# Patient Record
Sex: Female | Born: 1935 | Race: White | Hispanic: No | Smoking: Never smoker
Health system: Southern US, Community
[De-identification: ages and names within clinical notes are randomized; demographics above are authoritative.]

## PROBLEM LIST (undated history)

## (undated) DIAGNOSIS — M1990 Unspecified osteoarthritis, unspecified site: Secondary | ICD-10-CM

## (undated) DIAGNOSIS — I252 Old myocardial infarction: Secondary | ICD-10-CM

## (undated) DIAGNOSIS — M169 Osteoarthritis of hip, unspecified: Secondary | ICD-10-CM

## (undated) DIAGNOSIS — N183 Chronic kidney disease, stage 3 unspecified: Secondary | ICD-10-CM

## (undated) DIAGNOSIS — I509 Heart failure, unspecified: Secondary | ICD-10-CM

## (undated) DIAGNOSIS — N200 Calculus of kidney: Secondary | ICD-10-CM

## (undated) DIAGNOSIS — M810 Age-related osteoporosis without current pathological fracture: Secondary | ICD-10-CM

## (undated) DIAGNOSIS — Z9981 Dependence on supplemental oxygen: Secondary | ICD-10-CM

## (undated) DIAGNOSIS — I2589 Other forms of chronic ischemic heart disease: Secondary | ICD-10-CM

## (undated) DIAGNOSIS — E7800 Pure hypercholesterolemia, unspecified: Secondary | ICD-10-CM

## (undated) DIAGNOSIS — I2510 Atherosclerotic heart disease of native coronary artery without angina pectoris: Secondary | ICD-10-CM

## (undated) DIAGNOSIS — I513 Intracardiac thrombosis, not elsewhere classified: Secondary | ICD-10-CM

## (undated) DIAGNOSIS — I10 Essential (primary) hypertension: Secondary | ICD-10-CM

## (undated) HISTORY — DX: Intracardiac thrombosis, not elsewhere classified: I51.3

## (undated) HISTORY — DX: Essential (primary) hypertension: I10

## (undated) HISTORY — PX: CARDIAC CATHETERIZATION: SHX172

## (undated) HISTORY — DX: Osteoarthritis of hip, unspecified: M16.9

## (undated) HISTORY — DX: Calculus of kidney: N20.0

## (undated) HISTORY — DX: Age-related osteoporosis without current pathological fracture: M81.0

## (undated) HISTORY — DX: Atherosclerotic heart disease of native coronary artery without angina pectoris: I25.10

## (undated) HISTORY — PX: CORONARY ANGIOPLASTY: SHX604

## (undated) HISTORY — DX: Other forms of chronic ischemic heart disease: I25.89

## (undated) HISTORY — PX: OTHER SURGICAL HISTORY: SHX169

## (undated) HISTORY — DX: Dependence on supplemental oxygen: Z99.81

## (undated) HISTORY — DX: Unspecified osteoarthritis, unspecified site: M19.90

## (undated) HISTORY — DX: Heart failure, unspecified: I50.9

## (undated) HISTORY — PX: CORONARY ANGIOPLASTY WITH STENT PLACEMENT: SHX49

## (undated) HISTORY — DX: Pure hypercholesterolemia, unspecified: E78.00

---

## 1976-03-24 HISTORY — PX: VESICOVAGINAL FISTULA CLOSURE W/ TAH: SUR271

## 1997-05-02 ENCOUNTER — Ambulatory Visit (HOSPITAL_COMMUNITY): Admission: RE | Admit: 1997-05-02 | Discharge: 1997-05-02 | Payer: Self-pay | Admitting: Internal Medicine

## 1997-12-02 ENCOUNTER — Emergency Department (HOSPITAL_COMMUNITY): Admission: EM | Admit: 1997-12-02 | Discharge: 1997-12-02 | Payer: Self-pay | Admitting: Emergency Medicine

## 1998-05-23 ENCOUNTER — Encounter: Payer: Self-pay | Admitting: Internal Medicine

## 1998-05-23 ENCOUNTER — Ambulatory Visit (HOSPITAL_COMMUNITY): Admission: RE | Admit: 1998-05-23 | Discharge: 1998-05-23 | Payer: Self-pay | Admitting: Internal Medicine

## 1998-06-25 ENCOUNTER — Other Ambulatory Visit: Admission: RE | Admit: 1998-06-25 | Discharge: 1998-06-25 | Payer: Self-pay | Admitting: Internal Medicine

## 1999-02-05 ENCOUNTER — Encounter: Admission: RE | Admit: 1999-02-05 | Discharge: 1999-05-06 | Payer: Self-pay | Admitting: Anesthesiology

## 1999-06-03 ENCOUNTER — Ambulatory Visit (HOSPITAL_COMMUNITY): Admission: RE | Admit: 1999-06-03 | Discharge: 1999-06-03 | Payer: Self-pay | Admitting: Internal Medicine

## 1999-06-03 ENCOUNTER — Encounter: Payer: Self-pay | Admitting: Internal Medicine

## 1999-07-01 ENCOUNTER — Other Ambulatory Visit: Admission: RE | Admit: 1999-07-01 | Discharge: 1999-07-01 | Payer: Self-pay | Admitting: Internal Medicine

## 1999-10-09 ENCOUNTER — Encounter: Admission: RE | Admit: 1999-10-09 | Discharge: 1999-10-09 | Payer: Self-pay

## 2000-06-15 ENCOUNTER — Encounter: Payer: Self-pay | Admitting: Internal Medicine

## 2000-06-15 ENCOUNTER — Ambulatory Visit (HOSPITAL_COMMUNITY): Admission: RE | Admit: 2000-06-15 | Discharge: 2000-06-15 | Payer: Self-pay | Admitting: Internal Medicine

## 2000-06-29 ENCOUNTER — Other Ambulatory Visit: Admission: RE | Admit: 2000-06-29 | Discharge: 2000-06-29 | Payer: Self-pay | Admitting: Internal Medicine

## 2000-07-07 ENCOUNTER — Ambulatory Visit (HOSPITAL_COMMUNITY): Admission: RE | Admit: 2000-07-07 | Discharge: 2000-07-07 | Payer: Self-pay | Admitting: Internal Medicine

## 2000-07-07 ENCOUNTER — Encounter: Payer: Self-pay | Admitting: Internal Medicine

## 2000-08-11 ENCOUNTER — Encounter: Admission: RE | Admit: 2000-08-11 | Discharge: 2000-09-03 | Payer: Self-pay | Admitting: Neurosurgery

## 2000-11-17 ENCOUNTER — Ambulatory Visit (HOSPITAL_COMMUNITY): Admission: RE | Admit: 2000-11-17 | Discharge: 2000-11-17 | Payer: Self-pay | Admitting: Neurosurgery

## 2000-11-17 ENCOUNTER — Encounter: Payer: Self-pay | Admitting: Neurosurgery

## 2001-06-17 ENCOUNTER — Encounter: Payer: Self-pay | Admitting: Internal Medicine

## 2001-06-17 ENCOUNTER — Ambulatory Visit (HOSPITAL_COMMUNITY): Admission: RE | Admit: 2001-06-17 | Discharge: 2001-06-17 | Payer: Self-pay | Admitting: Internal Medicine

## 2001-07-22 ENCOUNTER — Other Ambulatory Visit: Admission: RE | Admit: 2001-07-22 | Discharge: 2001-07-22 | Payer: Self-pay | Admitting: Internal Medicine

## 2002-08-16 ENCOUNTER — Ambulatory Visit (HOSPITAL_COMMUNITY): Admission: RE | Admit: 2002-08-16 | Discharge: 2002-08-16 | Payer: Self-pay | Admitting: Internal Medicine

## 2002-08-16 ENCOUNTER — Encounter: Payer: Self-pay | Admitting: Internal Medicine

## 2003-04-13 ENCOUNTER — Ambulatory Visit (HOSPITAL_COMMUNITY): Admission: RE | Admit: 2003-04-13 | Discharge: 2003-04-13 | Payer: Self-pay | Admitting: Neurosurgery

## 2003-09-14 ENCOUNTER — Encounter: Admission: RE | Admit: 2003-09-14 | Discharge: 2003-09-14 | Payer: Self-pay | Admitting: Internal Medicine

## 2004-05-17 ENCOUNTER — Inpatient Hospital Stay (HOSPITAL_COMMUNITY): Admission: RE | Admit: 2004-05-17 | Discharge: 2004-05-27 | Payer: Self-pay | Admitting: Cardiology

## 2004-05-17 ENCOUNTER — Emergency Department (HOSPITAL_COMMUNITY): Admission: EM | Admit: 2004-05-17 | Discharge: 2004-05-17 | Payer: Self-pay | Admitting: Emergency Medicine

## 2004-05-17 ENCOUNTER — Ambulatory Visit: Payer: Self-pay | Admitting: Physical Medicine & Rehabilitation

## 2004-05-17 ENCOUNTER — Ambulatory Visit: Payer: Self-pay | Admitting: Cardiology

## 2004-05-21 ENCOUNTER — Encounter: Payer: Self-pay | Admitting: Cardiology

## 2004-06-14 ENCOUNTER — Ambulatory Visit: Payer: Self-pay | Admitting: Cardiology

## 2004-06-17 ENCOUNTER — Encounter (HOSPITAL_COMMUNITY): Admission: RE | Admit: 2004-06-17 | Discharge: 2004-07-17 | Payer: Self-pay | Admitting: Cardiology

## 2004-06-28 ENCOUNTER — Ambulatory Visit: Payer: Self-pay

## 2004-07-19 ENCOUNTER — Encounter (HOSPITAL_COMMUNITY): Admission: RE | Admit: 2004-07-19 | Discharge: 2004-08-18 | Payer: Self-pay | Admitting: Cardiology

## 2004-07-24 ENCOUNTER — Ambulatory Visit: Payer: Self-pay | Admitting: Cardiology

## 2004-07-24 ENCOUNTER — Ambulatory Visit: Payer: Self-pay

## 2004-07-30 ENCOUNTER — Ambulatory Visit: Payer: Self-pay | Admitting: Cardiology

## 2004-08-19 ENCOUNTER — Encounter (HOSPITAL_COMMUNITY): Admission: RE | Admit: 2004-08-19 | Discharge: 2004-09-18 | Payer: Self-pay | Admitting: Cardiology

## 2004-09-10 ENCOUNTER — Ambulatory Visit: Payer: Self-pay | Admitting: Cardiology

## 2004-11-13 ENCOUNTER — Ambulatory Visit: Payer: Self-pay

## 2004-11-13 ENCOUNTER — Ambulatory Visit: Payer: Self-pay | Admitting: Cardiology

## 2004-11-20 ENCOUNTER — Ambulatory Visit: Payer: Self-pay | Admitting: Internal Medicine

## 2004-11-28 ENCOUNTER — Ambulatory Visit: Payer: Self-pay | Admitting: Internal Medicine

## 2004-11-28 ENCOUNTER — Inpatient Hospital Stay (HOSPITAL_COMMUNITY): Admission: RE | Admit: 2004-11-28 | Discharge: 2004-11-29 | Payer: Self-pay | Admitting: Internal Medicine

## 2004-12-05 ENCOUNTER — Ambulatory Visit: Payer: Self-pay

## 2004-12-11 ENCOUNTER — Ambulatory Visit: Payer: Self-pay

## 2005-01-07 ENCOUNTER — Ambulatory Visit: Payer: Self-pay | Admitting: Cardiology

## 2005-03-11 ENCOUNTER — Ambulatory Visit: Payer: Self-pay | Admitting: Internal Medicine

## 2005-03-27 ENCOUNTER — Ambulatory Visit: Payer: Self-pay | Admitting: Cardiology

## 2005-06-10 ENCOUNTER — Ambulatory Visit: Payer: Self-pay | Admitting: Internal Medicine

## 2005-07-21 ENCOUNTER — Encounter: Payer: Self-pay | Admitting: Cardiology

## 2005-07-21 ENCOUNTER — Ambulatory Visit: Payer: Self-pay | Admitting: Cardiology

## 2005-07-21 ENCOUNTER — Ambulatory Visit: Payer: Self-pay

## 2005-10-14 ENCOUNTER — Ambulatory Visit: Payer: Self-pay | Admitting: Internal Medicine

## 2005-10-27 ENCOUNTER — Ambulatory Visit: Payer: Self-pay | Admitting: Cardiology

## 2006-01-13 ENCOUNTER — Ambulatory Visit: Payer: Self-pay | Admitting: Internal Medicine

## 2006-02-23 ENCOUNTER — Ambulatory Visit: Payer: Self-pay | Admitting: Cardiology

## 2006-03-10 ENCOUNTER — Ambulatory Visit: Payer: Self-pay

## 2006-03-10 ENCOUNTER — Encounter: Payer: Self-pay | Admitting: Cardiology

## 2006-03-18 ENCOUNTER — Ambulatory Visit: Payer: Self-pay | Admitting: Cardiology

## 2006-03-23 ENCOUNTER — Ambulatory Visit: Payer: Self-pay

## 2006-03-25 ENCOUNTER — Encounter: Admission: RE | Admit: 2006-03-25 | Discharge: 2006-03-25 | Payer: Self-pay | Admitting: Internal Medicine

## 2006-04-01 ENCOUNTER — Ambulatory Visit: Payer: Self-pay | Admitting: Cardiology

## 2006-04-07 ENCOUNTER — Ambulatory Visit: Payer: Self-pay | Admitting: Cardiology

## 2006-04-07 ENCOUNTER — Ambulatory Visit (HOSPITAL_COMMUNITY): Admission: AD | Admit: 2006-04-07 | Discharge: 2006-04-07 | Payer: Self-pay | Admitting: Urology

## 2006-04-17 ENCOUNTER — Ambulatory Visit: Payer: Self-pay | Admitting: Internal Medicine

## 2006-05-01 ENCOUNTER — Ambulatory Visit: Payer: Self-pay | Admitting: *Deleted

## 2006-05-11 ENCOUNTER — Ambulatory Visit: Payer: Self-pay | Admitting: Internal Medicine

## 2006-05-24 ENCOUNTER — Ambulatory Visit: Payer: Self-pay | Admitting: Internal Medicine

## 2006-05-24 ENCOUNTER — Inpatient Hospital Stay (HOSPITAL_COMMUNITY): Admission: EM | Admit: 2006-05-24 | Discharge: 2006-05-28 | Payer: Self-pay | Admitting: Emergency Medicine

## 2006-06-01 ENCOUNTER — Ambulatory Visit: Payer: Self-pay | Admitting: Internal Medicine

## 2006-06-09 ENCOUNTER — Ambulatory Visit: Payer: Self-pay | Admitting: Cardiology

## 2006-06-16 ENCOUNTER — Ambulatory Visit: Payer: Self-pay | Admitting: Cardiology

## 2006-06-30 ENCOUNTER — Ambulatory Visit: Payer: Self-pay | Admitting: Cardiology

## 2006-07-14 ENCOUNTER — Ambulatory Visit: Payer: Self-pay | Admitting: Cardiology

## 2006-08-11 ENCOUNTER — Ambulatory Visit: Payer: Self-pay | Admitting: Cardiology

## 2006-09-01 ENCOUNTER — Ambulatory Visit: Payer: Self-pay | Admitting: Internal Medicine

## 2006-09-08 ENCOUNTER — Ambulatory Visit: Payer: Self-pay | Admitting: Internal Medicine

## 2006-10-06 ENCOUNTER — Ambulatory Visit: Payer: Self-pay | Admitting: Cardiology

## 2006-10-16 ENCOUNTER — Ambulatory Visit: Payer: Self-pay | Admitting: Cardiovascular Disease

## 2006-10-26 ENCOUNTER — Ambulatory Visit: Payer: Self-pay | Admitting: Cardiology

## 2006-10-26 LAB — CONVERTED CEMR LAB
BUN: 24 mg/dL — ABNORMAL HIGH (ref 6–23)
Basophils Absolute: 0 10*3/uL (ref 0.0–0.1)
Calcium: 9.6 mg/dL (ref 8.4–10.5)
Chloride: 106 meq/L (ref 96–112)
Creatinine, Ser: 0.8 mg/dL (ref 0.4–1.2)
HCT: 35.9 % — ABNORMAL LOW (ref 36.0–46.0)
MCHC: 35.2 g/dL (ref 30.0–36.0)
Neutrophils Relative %: 55.5 % (ref 43.0–77.0)
Platelets: 200 10*3/uL (ref 150–400)
RBC: 3.88 M/uL (ref 3.87–5.11)
RDW: 12.2 % (ref 11.5–14.6)
Sodium: 143 meq/L (ref 135–145)

## 2006-11-09 ENCOUNTER — Ambulatory Visit: Payer: Self-pay | Admitting: Cardiology

## 2006-11-24 ENCOUNTER — Ambulatory Visit: Payer: Self-pay | Admitting: Cardiology

## 2006-12-08 ENCOUNTER — Ambulatory Visit: Payer: Self-pay | Admitting: Cardiology

## 2006-12-29 ENCOUNTER — Ambulatory Visit: Payer: Self-pay | Admitting: Cardiovascular Disease

## 2007-01-12 ENCOUNTER — Ambulatory Visit: Payer: Self-pay | Admitting: Cardiology

## 2007-01-26 ENCOUNTER — Ambulatory Visit: Payer: Self-pay | Admitting: Cardiology

## 2007-02-23 ENCOUNTER — Ambulatory Visit: Payer: Self-pay | Admitting: Cardiology

## 2007-02-23 ENCOUNTER — Ambulatory Visit: Payer: Self-pay | Admitting: Internal Medicine

## 2007-02-23 ENCOUNTER — Ambulatory Visit: Payer: Self-pay

## 2007-02-23 ENCOUNTER — Encounter: Payer: Self-pay | Admitting: Cardiology

## 2007-03-03 ENCOUNTER — Ambulatory Visit: Payer: Self-pay | Admitting: Cardiology

## 2007-03-03 LAB — CONVERTED CEMR LAB
Basophils Relative: 0.5 % (ref 0.0–1.0)
CO2: 30 meq/L (ref 19–32)
Eosinophils Absolute: 0.1 10*3/uL (ref 0.0–0.6)
Eosinophils Relative: 2.1 % (ref 0.0–5.0)
GFR calc Af Amer: 104 mL/min
GFR calc non Af Amer: 86 mL/min
Glucose, Bld: 131 mg/dL — ABNORMAL HIGH (ref 70–99)
HCT: 36.1 % (ref 36.0–46.0)
Hemoglobin: 12.4 g/dL (ref 12.0–15.0)
Lymphocytes Relative: 37.6 % (ref 12.0–46.0)
MCV: 93.7 fL (ref 78.0–100.0)
Monocytes Absolute: 0.6 10*3/uL (ref 0.2–0.7)
Neutro Abs: 3.4 10*3/uL (ref 1.4–7.7)
Neutrophils Relative %: 51.3 % (ref 43.0–77.0)
Sodium: 143 meq/L (ref 135–145)
WBC: 6.5 10*3/uL (ref 4.5–10.5)

## 2007-03-23 ENCOUNTER — Ambulatory Visit: Payer: Self-pay | Admitting: Internal Medicine

## 2007-03-23 ENCOUNTER — Ambulatory Visit: Payer: Self-pay | Admitting: Cardiology

## 2007-04-20 ENCOUNTER — Ambulatory Visit: Payer: Self-pay | Admitting: Cardiovascular Disease

## 2007-05-03 ENCOUNTER — Ambulatory Visit: Payer: Self-pay | Admitting: Cardiology

## 2007-05-17 ENCOUNTER — Ambulatory Visit: Payer: Self-pay | Admitting: Cardiology

## 2007-05-17 ENCOUNTER — Ambulatory Visit: Payer: Self-pay | Admitting: Internal Medicine

## 2007-05-17 LAB — CONVERTED CEMR LAB
CO2: 28 meq/L (ref 19–32)
Chloride: 104 meq/L (ref 96–112)
GFR calc Af Amer: 89 mL/min
GFR calc non Af Amer: 73 mL/min
Glucose, Bld: 104 mg/dL — ABNORMAL HIGH (ref 70–99)
Sodium: 140 meq/L (ref 135–145)

## 2007-05-29 ENCOUNTER — Ambulatory Visit: Payer: Self-pay | Admitting: Internal Medicine

## 2007-05-29 ENCOUNTER — Inpatient Hospital Stay (HOSPITAL_COMMUNITY): Admission: EM | Admit: 2007-05-29 | Discharge: 2007-06-02 | Payer: Self-pay | Admitting: Emergency Medicine

## 2007-06-07 ENCOUNTER — Ambulatory Visit: Payer: Self-pay | Admitting: Internal Medicine

## 2007-06-08 ENCOUNTER — Ambulatory Visit: Payer: Self-pay | Admitting: Cardiology

## 2007-06-14 ENCOUNTER — Ambulatory Visit: Payer: Self-pay | Admitting: Cardiovascular Disease

## 2007-07-05 ENCOUNTER — Ambulatory Visit: Payer: Self-pay | Admitting: Internal Medicine

## 2007-07-15 ENCOUNTER — Encounter: Payer: Self-pay | Admitting: Cardiology

## 2007-07-15 ENCOUNTER — Ambulatory Visit: Payer: Self-pay | Admitting: Internal Medicine

## 2007-07-15 ENCOUNTER — Ambulatory Visit: Payer: Self-pay

## 2007-07-26 ENCOUNTER — Ambulatory Visit: Payer: Self-pay | Admitting: Internal Medicine

## 2007-08-05 ENCOUNTER — Ambulatory Visit: Payer: Self-pay | Admitting: Cardiology

## 2007-08-05 LAB — CONVERTED CEMR LAB
Basophils Absolute: 0 10*3/uL (ref 0.0–0.1)
Basophils Relative: 0.6 % (ref 0.0–1.0)
CO2: 28 meq/L (ref 19–32)
Calcium: 9.2 mg/dL (ref 8.4–10.5)
Creatinine, Ser: 0.7 mg/dL (ref 0.4–1.2)
Eosinophils Absolute: 0.1 10*3/uL (ref 0.0–0.7)
GFR calc Af Amer: 104 mL/min
GFR calc non Af Amer: 86 mL/min
Hemoglobin: 11.7 g/dL — ABNORMAL LOW (ref 12.0–15.0)
Lymphocytes Relative: 34.6 % (ref 12.0–46.0)
MCHC: 33.5 g/dL (ref 30.0–36.0)
MCV: 94.1 fL (ref 78.0–100.0)
Neutro Abs: 3.1 10*3/uL (ref 1.4–7.7)
Pro B Natriuretic peptide (BNP): 337 pg/mL — ABNORMAL HIGH (ref 0.0–100.0)
RDW: 12.1 % (ref 11.5–14.6)
Sodium: 142 meq/L (ref 135–145)

## 2007-08-23 ENCOUNTER — Ambulatory Visit: Payer: Self-pay | Admitting: Cardiovascular Disease

## 2007-08-30 ENCOUNTER — Ambulatory Visit: Payer: Self-pay | Admitting: Cardiology

## 2007-09-20 ENCOUNTER — Ambulatory Visit: Payer: Self-pay | Admitting: Cardiology

## 2007-10-18 ENCOUNTER — Ambulatory Visit: Payer: Self-pay | Admitting: Cardiology

## 2007-10-26 ENCOUNTER — Ambulatory Visit: Payer: Self-pay | Admitting: Cardiology

## 2007-11-10 ENCOUNTER — Ambulatory Visit: Payer: Self-pay | Admitting: Cardiology

## 2007-11-10 LAB — CONVERTED CEMR LAB
BUN: 32 mg/dL — ABNORMAL HIGH (ref 6–23)
CO2: 29 meq/L (ref 19–32)
Chloride: 103 meq/L (ref 96–112)
Eosinophils Relative: 2.2 % (ref 0.0–5.0)
GFR calc Af Amer: 69 mL/min
Glucose, Bld: 112 mg/dL — ABNORMAL HIGH (ref 70–99)
HCT: 34.2 % — ABNORMAL LOW (ref 36.0–46.0)
Lymphocytes Relative: 31.6 % (ref 12.0–46.0)
Monocytes Absolute: 0.5 10*3/uL (ref 0.1–1.0)
Monocytes Relative: 8.2 % (ref 3.0–12.0)
Neutrophils Relative %: 57.6 % (ref 43.0–77.0)
Platelets: 201 10*3/uL (ref 150–400)
Potassium: 4 meq/L (ref 3.5–5.1)
RDW: 12.1 % (ref 11.5–14.6)
Sodium: 140 meq/L (ref 135–145)
WBC: 6.4 10*3/uL (ref 4.5–10.5)

## 2007-11-19 ENCOUNTER — Ambulatory Visit: Payer: Self-pay | Admitting: Cardiology

## 2007-11-19 LAB — CONVERTED CEMR LAB
BUN: 28 mg/dL — ABNORMAL HIGH (ref 6–23)
CO2: 27 meq/L (ref 19–32)
Chloride: 108 meq/L (ref 96–112)
Creatinine, Ser: 0.8 mg/dL (ref 0.4–1.2)
GFR calc non Af Amer: 73 mL/min
Potassium: 4.4 meq/L (ref 3.5–5.1)

## 2007-11-30 ENCOUNTER — Ambulatory Visit: Payer: Self-pay | Admitting: Cardiology

## 2007-12-07 ENCOUNTER — Ambulatory Visit: Payer: Self-pay | Admitting: Cardiology

## 2007-12-07 LAB — CONVERTED CEMR LAB
BUN: 23 mg/dL (ref 6–23)
Calcium: 9.4 mg/dL (ref 8.4–10.5)
Eosinophils Absolute: 0.1 10*3/uL (ref 0.0–0.7)
Eosinophils Relative: 2.4 % (ref 0.0–5.0)
GFR calc Af Amer: 89 mL/min
GFR calc non Af Amer: 73 mL/min
Glucose, Bld: 122 mg/dL — ABNORMAL HIGH (ref 70–99)
HCT: 37.2 % (ref 36.0–46.0)
Hemoglobin: 12.7 g/dL (ref 12.0–15.0)
MCV: 94.2 fL (ref 78.0–100.0)
Monocytes Absolute: 0.5 10*3/uL (ref 0.1–1.0)
Monocytes Relative: 8 % (ref 3.0–12.0)
Neutro Abs: 3.3 10*3/uL (ref 1.4–7.7)
RDW: 11.8 % (ref 11.5–14.6)

## 2007-12-15 ENCOUNTER — Ambulatory Visit: Payer: Self-pay | Admitting: Cardiology

## 2007-12-15 LAB — CONVERTED CEMR LAB
BUN: 27 mg/dL — ABNORMAL HIGH (ref 6–23)
Calcium: 9.5 mg/dL (ref 8.4–10.5)
Creatinine, Ser: 0.8 mg/dL (ref 0.4–1.2)
GFR calc Af Amer: 89 mL/min
Glucose, Bld: 116 mg/dL — ABNORMAL HIGH (ref 70–99)

## 2007-12-24 ENCOUNTER — Ambulatory Visit: Payer: Self-pay | Admitting: Cardiology

## 2008-01-04 ENCOUNTER — Ambulatory Visit (HOSPITAL_COMMUNITY): Admission: RE | Admit: 2008-01-04 | Discharge: 2008-01-04 | Payer: Self-pay | Admitting: Internal Medicine

## 2008-01-04 ENCOUNTER — Ambulatory Visit: Payer: Self-pay | Admitting: Internal Medicine

## 2008-01-18 ENCOUNTER — Ambulatory Visit: Payer: Self-pay | Admitting: Cardiovascular Disease

## 2008-01-25 ENCOUNTER — Ambulatory Visit: Payer: Self-pay | Admitting: Internal Medicine

## 2008-02-07 ENCOUNTER — Ambulatory Visit: Payer: Self-pay | Admitting: Cardiology

## 2008-02-14 ENCOUNTER — Ambulatory Visit: Payer: Self-pay | Admitting: Cardiology

## 2008-02-14 ENCOUNTER — Ambulatory Visit: Payer: Self-pay | Admitting: Internal Medicine

## 2008-02-14 LAB — CONVERTED CEMR LAB
Basophils Absolute: 0 10*3/uL (ref 0.0–0.1)
CO2: 28 meq/L (ref 19–32)
Calcium: 9.4 mg/dL (ref 8.4–10.5)
Chloride: 105 meq/L (ref 96–112)
Lymphocytes Relative: 34.2 % (ref 12.0–46.0)
MCHC: 34.9 g/dL (ref 30.0–36.0)
Neutro Abs: 3.5 10*3/uL (ref 1.4–7.7)
Neutrophils Relative %: 55.5 % (ref 43.0–77.0)
Platelets: 168 10*3/uL (ref 150–400)
Potassium: 4 meq/L (ref 3.5–5.1)
RDW: 12.5 % (ref 11.5–14.6)
Sodium: 140 meq/L (ref 135–145)

## 2008-03-06 ENCOUNTER — Ambulatory Visit: Payer: Self-pay | Admitting: Cardiology

## 2008-04-05 ENCOUNTER — Ambulatory Visit: Payer: Self-pay | Admitting: Cardiology

## 2008-04-05 LAB — CONVERTED CEMR LAB
Basophils Absolute: 0 10*3/uL (ref 0.0–0.1)
Bilirubin, Direct: 0.1 mg/dL (ref 0.0–0.3)
Calcium: 10 mg/dL (ref 8.4–10.5)
Cholesterol: 199 mg/dL (ref 0–200)
GFR calc Af Amer: 77 mL/min
GFR calc non Af Amer: 64 mL/min
Hemoglobin: 12.4 g/dL (ref 12.0–15.0)
LDL Cholesterol: 132 mg/dL — ABNORMAL HIGH (ref 0–99)
Lymphocytes Relative: 32.3 % (ref 12.0–46.0)
MCHC: 34.6 g/dL (ref 30.0–36.0)
Monocytes Absolute: 0.4 10*3/uL (ref 0.1–1.0)
Neutro Abs: 3.1 10*3/uL (ref 1.4–7.7)
RDW: 11.3 % — ABNORMAL LOW (ref 11.5–14.6)
Sodium: 142 meq/L (ref 135–145)
Total Bilirubin: 0.7 mg/dL (ref 0.3–1.2)
Triglycerides: 125 mg/dL (ref 0–149)

## 2008-04-14 ENCOUNTER — Ambulatory Visit: Payer: Self-pay

## 2008-04-14 ENCOUNTER — Ambulatory Visit: Payer: Self-pay | Admitting: Internal Medicine

## 2008-04-14 ENCOUNTER — Encounter: Payer: Self-pay | Admitting: Cardiology

## 2008-04-25 ENCOUNTER — Ambulatory Visit: Payer: Self-pay | Admitting: Internal Medicine

## 2008-04-27 ENCOUNTER — Ambulatory Visit: Payer: Self-pay | Admitting: Internal Medicine

## 2008-05-17 ENCOUNTER — Encounter: Payer: Self-pay | Admitting: Internal Medicine

## 2008-05-25 ENCOUNTER — Ambulatory Visit: Payer: Self-pay | Admitting: Cardiovascular Disease

## 2008-06-19 DIAGNOSIS — E7800 Pure hypercholesterolemia, unspecified: Secondary | ICD-10-CM | POA: Insufficient documentation

## 2008-06-19 DIAGNOSIS — I253 Aneurysm of heart: Secondary | ICD-10-CM

## 2008-06-19 DIAGNOSIS — I2589 Other forms of chronic ischemic heart disease: Secondary | ICD-10-CM | POA: Insufficient documentation

## 2008-06-19 DIAGNOSIS — I10 Essential (primary) hypertension: Secondary | ICD-10-CM

## 2008-06-19 DIAGNOSIS — M129 Arthropathy, unspecified: Secondary | ICD-10-CM | POA: Insufficient documentation

## 2008-06-19 DIAGNOSIS — I2510 Atherosclerotic heart disease of native coronary artery without angina pectoris: Secondary | ICD-10-CM | POA: Insufficient documentation

## 2008-06-19 DIAGNOSIS — I2584 Coronary atherosclerosis due to calcified coronary lesion: Secondary | ICD-10-CM

## 2008-06-19 DIAGNOSIS — R0602 Shortness of breath: Secondary | ICD-10-CM | POA: Insufficient documentation

## 2008-06-19 DIAGNOSIS — E785 Hyperlipidemia, unspecified: Secondary | ICD-10-CM

## 2008-06-19 DIAGNOSIS — I5020 Unspecified systolic (congestive) heart failure: Secondary | ICD-10-CM | POA: Insufficient documentation

## 2008-06-26 ENCOUNTER — Encounter: Payer: Self-pay | Admitting: Cardiology

## 2008-06-26 ENCOUNTER — Ambulatory Visit: Payer: Self-pay | Admitting: Cardiology

## 2008-07-24 ENCOUNTER — Ambulatory Visit: Payer: Self-pay | Admitting: Internal Medicine

## 2008-08-22 ENCOUNTER — Encounter: Payer: Self-pay | Admitting: *Deleted

## 2008-08-22 ENCOUNTER — Ambulatory Visit: Payer: Self-pay | Admitting: Cardiovascular Disease

## 2008-08-22 LAB — CONVERTED CEMR LAB
POC INR: 1.8
Protime: 16.4

## 2008-09-12 ENCOUNTER — Ambulatory Visit: Payer: Self-pay | Admitting: Cardiovascular Disease

## 2008-09-12 LAB — CONVERTED CEMR LAB: POC INR: 2.6

## 2008-09-19 ENCOUNTER — Ambulatory Visit: Payer: Self-pay | Admitting: Internal Medicine

## 2008-09-25 ENCOUNTER — Encounter: Payer: Self-pay | Admitting: Internal Medicine

## 2008-09-27 ENCOUNTER — Encounter: Payer: Self-pay | Admitting: *Deleted

## 2008-10-03 ENCOUNTER — Encounter (INDEPENDENT_AMBULATORY_CARE_PROVIDER_SITE_OTHER): Payer: Self-pay | Admitting: Cardiology

## 2008-10-03 ENCOUNTER — Ambulatory Visit: Payer: Self-pay | Admitting: Cardiovascular Disease

## 2008-10-03 ENCOUNTER — Ambulatory Visit: Payer: Self-pay | Admitting: Cardiology

## 2008-10-03 LAB — CONVERTED CEMR LAB: POC INR: 4.9

## 2008-10-04 ENCOUNTER — Telehealth: Payer: Self-pay | Admitting: Cardiology

## 2008-10-20 ENCOUNTER — Ambulatory Visit: Payer: Self-pay | Admitting: Cardiology

## 2008-11-02 ENCOUNTER — Encounter: Payer: Self-pay | Admitting: Cardiology

## 2008-11-09 ENCOUNTER — Ambulatory Visit: Payer: Self-pay | Admitting: Cardiology

## 2008-11-09 LAB — CONVERTED CEMR LAB: POC INR: 3.8

## 2008-11-20 ENCOUNTER — Telehealth: Payer: Self-pay | Admitting: Cardiology

## 2008-11-24 ENCOUNTER — Ambulatory Visit: Payer: Self-pay | Admitting: Internal Medicine

## 2008-11-24 LAB — CONVERTED CEMR LAB: POC INR: 2.9

## 2008-12-04 ENCOUNTER — Ambulatory Visit: Payer: Self-pay | Admitting: Internal Medicine

## 2008-12-04 LAB — CONVERTED CEMR LAB: POC INR: 2.2

## 2008-12-05 ENCOUNTER — Encounter: Payer: Self-pay | Admitting: Cardiology

## 2008-12-19 ENCOUNTER — Ambulatory Visit: Payer: Self-pay | Admitting: Internal Medicine

## 2008-12-25 ENCOUNTER — Ambulatory Visit: Payer: Self-pay | Admitting: Cardiovascular Disease

## 2008-12-25 LAB — CONVERTED CEMR LAB: POC INR: 2.8

## 2008-12-28 ENCOUNTER — Encounter: Payer: Self-pay | Admitting: Internal Medicine

## 2008-12-29 ENCOUNTER — Telehealth: Payer: Self-pay | Admitting: Cardiology

## 2009-01-22 ENCOUNTER — Ambulatory Visit (HOSPITAL_COMMUNITY): Admission: RE | Admit: 2009-01-22 | Discharge: 2009-01-22 | Payer: Self-pay | Admitting: Cardiology

## 2009-01-22 ENCOUNTER — Ambulatory Visit: Payer: Self-pay | Admitting: Cardiology

## 2009-01-22 LAB — CONVERTED CEMR LAB: POC INR: 2.1

## 2009-02-02 ENCOUNTER — Encounter (INDEPENDENT_AMBULATORY_CARE_PROVIDER_SITE_OTHER): Payer: Self-pay | Admitting: *Deleted

## 2009-02-19 ENCOUNTER — Ambulatory Visit: Payer: Self-pay | Admitting: Cardiology

## 2009-03-17 ENCOUNTER — Inpatient Hospital Stay (HOSPITAL_COMMUNITY): Admission: EM | Admit: 2009-03-17 | Discharge: 2009-03-23 | Payer: Self-pay | Admitting: Emergency Medicine

## 2009-03-25 ENCOUNTER — Telehealth: Payer: Self-pay | Admitting: Adult Health

## 2009-03-26 ENCOUNTER — Encounter: Payer: Self-pay | Admitting: Cardiology

## 2009-03-28 ENCOUNTER — Encounter (INDEPENDENT_AMBULATORY_CARE_PROVIDER_SITE_OTHER): Payer: Self-pay | Admitting: Cardiology

## 2009-03-28 ENCOUNTER — Encounter: Payer: Self-pay | Admitting: Cardiovascular Disease

## 2009-03-28 LAB — CONVERTED CEMR LAB: POC INR: 1.5

## 2009-04-02 ENCOUNTER — Encounter: Payer: Self-pay | Admitting: Internal Medicine

## 2009-04-04 ENCOUNTER — Encounter: Payer: Self-pay | Admitting: Cardiology

## 2009-04-09 ENCOUNTER — Encounter: Payer: Self-pay | Admitting: Cardiology

## 2009-04-12 ENCOUNTER — Encounter: Payer: Self-pay | Admitting: Cardiology

## 2009-04-16 ENCOUNTER — Encounter: Payer: Self-pay | Admitting: Cardiology

## 2009-04-18 ENCOUNTER — Encounter: Payer: Self-pay | Admitting: Cardiology

## 2009-04-23 ENCOUNTER — Encounter: Payer: Self-pay | Admitting: Cardiology

## 2009-04-23 LAB — CONVERTED CEMR LAB: POC INR: 3.6

## 2009-04-26 ENCOUNTER — Encounter: Payer: Self-pay | Admitting: Cardiology

## 2009-04-27 ENCOUNTER — Encounter: Payer: Self-pay | Admitting: Cardiology

## 2009-04-30 ENCOUNTER — Encounter: Payer: Self-pay | Admitting: Cardiovascular Disease

## 2009-04-30 LAB — CONVERTED CEMR LAB: POC INR: 2.1

## 2009-05-01 ENCOUNTER — Ambulatory Visit: Payer: Self-pay | Admitting: Internal Medicine

## 2009-05-01 DIAGNOSIS — Z95810 Presence of automatic (implantable) cardiac defibrillator: Secondary | ICD-10-CM | POA: Insufficient documentation

## 2009-05-07 ENCOUNTER — Encounter: Payer: Self-pay | Admitting: Cardiology

## 2009-05-07 LAB — CONVERTED CEMR LAB: POC INR: 2.3

## 2009-05-15 ENCOUNTER — Encounter: Payer: Self-pay | Admitting: Cardiology

## 2009-05-22 ENCOUNTER — Ambulatory Visit: Payer: Self-pay | Admitting: Cardiovascular Disease

## 2009-05-22 ENCOUNTER — Ambulatory Visit: Payer: Self-pay | Admitting: Cardiology

## 2009-05-22 LAB — CONVERTED CEMR LAB: POC INR: 2.8

## 2009-06-11 ENCOUNTER — Telehealth: Payer: Self-pay | Admitting: Cardiology

## 2009-06-18 ENCOUNTER — Ambulatory Visit: Payer: Self-pay | Admitting: Internal Medicine

## 2009-06-18 LAB — CONVERTED CEMR LAB: POC INR: 2.1

## 2009-06-19 ENCOUNTER — Encounter: Payer: Self-pay | Admitting: Cardiology

## 2009-07-16 ENCOUNTER — Ambulatory Visit: Payer: Self-pay | Admitting: Cardiology

## 2009-07-16 LAB — CONVERTED CEMR LAB: POC INR: 1.8

## 2009-07-26 ENCOUNTER — Encounter: Payer: Self-pay | Admitting: Internal Medicine

## 2009-08-03 ENCOUNTER — Encounter: Payer: Self-pay | Admitting: Internal Medicine

## 2009-08-06 ENCOUNTER — Ambulatory Visit: Payer: Self-pay | Admitting: Internal Medicine

## 2009-08-28 ENCOUNTER — Ambulatory Visit: Payer: Self-pay | Admitting: Cardiology

## 2009-09-04 ENCOUNTER — Encounter: Payer: Self-pay | Admitting: Cardiology

## 2009-09-18 ENCOUNTER — Ambulatory Visit: Payer: Self-pay | Admitting: Cardiology

## 2009-10-16 ENCOUNTER — Ambulatory Visit: Payer: Self-pay | Admitting: Cardiology

## 2009-10-16 LAB — CONVERTED CEMR LAB: POC INR: 1.9

## 2009-10-25 ENCOUNTER — Encounter: Payer: Self-pay | Admitting: Internal Medicine

## 2009-10-30 ENCOUNTER — Ambulatory Visit: Payer: Self-pay | Admitting: Cardiology

## 2009-10-30 ENCOUNTER — Ambulatory Visit: Payer: Self-pay | Admitting: Internal Medicine

## 2009-10-30 LAB — CONVERTED CEMR LAB: POC INR: 2.4

## 2009-11-09 ENCOUNTER — Encounter: Payer: Self-pay | Admitting: Internal Medicine

## 2009-11-27 ENCOUNTER — Ambulatory Visit: Payer: Self-pay | Admitting: Cardiology

## 2009-11-27 ENCOUNTER — Ambulatory Visit: Payer: Self-pay | Admitting: Cardiovascular Disease

## 2009-12-06 ENCOUNTER — Ambulatory Visit: Payer: Self-pay | Admitting: Cardiology

## 2009-12-07 LAB — CONVERTED CEMR LAB
BUN: 22 mg/dL (ref 6–23)
CO2: 27 meq/L (ref 19–32)
Calcium: 8.7 mg/dL (ref 8.4–10.5)
Glucose, Bld: 119 mg/dL — ABNORMAL HIGH (ref 70–99)
Potassium: 4.4 meq/L (ref 3.5–5.1)
Sodium: 140 meq/L (ref 135–145)

## 2009-12-25 ENCOUNTER — Ambulatory Visit: Payer: Self-pay | Admitting: Cardiology

## 2010-01-22 ENCOUNTER — Ambulatory Visit: Payer: Self-pay | Admitting: Cardiovascular Disease

## 2010-01-24 ENCOUNTER — Ambulatory Visit (HOSPITAL_COMMUNITY): Admission: RE | Admit: 2010-01-24 | Discharge: 2010-01-24 | Payer: Self-pay | Admitting: Internal Medicine

## 2010-01-31 ENCOUNTER — Ambulatory Visit: Payer: Self-pay | Admitting: Internal Medicine

## 2010-02-21 ENCOUNTER — Encounter (INDEPENDENT_AMBULATORY_CARE_PROVIDER_SITE_OTHER): Payer: Self-pay | Admitting: *Deleted

## 2010-02-26 ENCOUNTER — Ambulatory Visit: Payer: Self-pay | Admitting: Cardiology

## 2010-02-26 ENCOUNTER — Encounter: Payer: Self-pay | Admitting: Cardiology

## 2010-02-26 ENCOUNTER — Ambulatory Visit: Payer: Self-pay | Admitting: Cardiovascular Disease

## 2010-03-01 LAB — CONVERTED CEMR LAB
BUN: 34 mg/dL — ABNORMAL HIGH (ref 6–23)
CO2: 29 meq/L (ref 19–32)
Calcium: 9.7 mg/dL (ref 8.4–10.5)
Glucose, Bld: 128 mg/dL — ABNORMAL HIGH (ref 70–99)
Sodium: 140 meq/L (ref 135–145)

## 2010-04-02 ENCOUNTER — Ambulatory Visit: Admission: RE | Admit: 2010-04-02 | Discharge: 2010-04-02 | Payer: Self-pay | Source: Home / Self Care

## 2010-04-15 ENCOUNTER — Encounter: Payer: Self-pay | Admitting: Internal Medicine

## 2010-04-16 ENCOUNTER — Telehealth: Payer: Self-pay | Admitting: Cardiology

## 2010-04-21 LAB — CONVERTED CEMR LAB
ALT: 27 units/L (ref 0–35)
AST: 30 units/L (ref 0–37)
Alkaline Phosphatase: 54 units/L (ref 39–117)
BUN: 28 mg/dL — ABNORMAL HIGH (ref 6–23)
BUN: 29 mg/dL — ABNORMAL HIGH (ref 6–23)
BUN: 39 mg/dL — ABNORMAL HIGH (ref 6–23)
BUN: 41 mg/dL — ABNORMAL HIGH (ref 6–23)
Basophils Absolute: 0 10*3/uL (ref 0.0–0.1)
Basophils Absolute: 0.1 10*3/uL (ref 0.0–0.1)
Basophils Relative: 0.3 % (ref 0.0–3.0)
Basophils Relative: 1 % (ref 0.0–3.0)
Bilirubin, Direct: 0 mg/dL (ref 0.0–0.3)
CO2: 28 meq/L (ref 19–32)
CO2: 29 meq/L (ref 19–32)
CO2: 29 meq/L (ref 19–32)
CO2: 29 meq/L (ref 19–32)
CO2: 30 meq/L (ref 19–32)
Calcium: 8.8 mg/dL (ref 8.4–10.5)
Calcium: 9.7 mg/dL (ref 8.4–10.5)
Calcium: 9.8 mg/dL (ref 8.4–10.5)
Chloride: 105 meq/L (ref 96–112)
Cholesterol: 139 mg/dL (ref 0–200)
Creatinine, Ser: 1 mg/dL (ref 0.4–1.2)
Creatinine, Ser: 1.1 mg/dL (ref 0.4–1.2)
Eosinophils Absolute: 0.1 10*3/uL (ref 0.0–0.7)
Eosinophils Absolute: 0.2 10*3/uL (ref 0.0–0.7)
Eosinophils Relative: 1.1 % (ref 0.0–5.0)
Eosinophils Relative: 2.5 % (ref 0.0–5.0)
GFR calc non Af Amer: 56.41 mL/min (ref >60)
GFR calc non Af Amer: 60.55 mL/min (ref >60)
Glucose, Bld: 111 mg/dL — ABNORMAL HIGH (ref 70–99)
Glucose, Bld: 116 mg/dL — ABNORMAL HIGH (ref 70–99)
Glucose, Bld: 131 mg/dL — ABNORMAL HIGH (ref 70–99)
HCT: 33.5 % — ABNORMAL LOW (ref 36.0–46.0)
Hemoglobin: 11.3 g/dL — ABNORMAL LOW (ref 12.0–15.0)
Hemoglobin: 11.4 g/dL — ABNORMAL LOW (ref 12.0–15.0)
Lymphocytes Relative: 32.2 % (ref 12.0–46.0)
Lymphocytes Relative: 33.2 % (ref 12.0–46.0)
Lymphs Abs: 1.8 10*3/uL (ref 0.7–4.0)
Lymphs Abs: 1.9 10*3/uL (ref 0.7–4.0)
MCHC: 34.3 g/dL (ref 30.0–36.0)
MCV: 94.8 fL (ref 78.0–100.0)
Monocytes Absolute: 0.4 10*3/uL (ref 0.1–1.0)
Monocytes Relative: 6.9 % (ref 3.0–12.0)
Monocytes Relative: 6.9 % (ref 3.0–12.0)
Neutro Abs: 3.5 10*3/uL (ref 1.4–7.7)
Neutrophils Relative %: 65.7 % (ref 43.0–77.0)
Platelets: 179 10*3/uL (ref 150.0–400.0)
Platelets: 195 10*3/uL (ref 150.0–400.0)
Potassium: 4.5 meq/L (ref 3.5–5.1)
Potassium: 5.1 meq/L (ref 3.5–5.1)
RBC: 3.53 M/uL — ABNORMAL LOW (ref 3.87–5.11)
RBC: 3.56 M/uL — ABNORMAL LOW (ref 3.87–5.11)
RDW: 11.9 % (ref 11.5–14.6)
Sodium: 141 meq/L (ref 135–145)
Sodium: 142 meq/L (ref 135–145)
Sodium: 142 meq/L (ref 135–145)
Sodium: 144 meq/L (ref 135–145)
Total Bilirubin: 0.6 mg/dL (ref 0.3–1.2)
WBC: 7 10*3/uL (ref 4.5–10.5)

## 2010-04-25 NOTE — Miscellaneous (Signed)
Summary: [facility]   [facility]   Imported By: Roderic Ovens 05/08/2009 12:11:14  _____________________________________________________________________  External Attachment:    Type:   Image     Comment:   External Document

## 2010-04-25 NOTE — Letter (Signed)
Summary: Remote Device Check  [employer], Main Office  [address]   [place], [place] [address]   Phone: [phone]  Fax: [phone]     Aug 03, 2009 MRN: [id number]   [facility] [address] [place], [place]  [address]   Dear Ms. Maniaci,   Your remote transmission was recieved and reviewed by your physician.  All diagnostics were within normal limits for you.  __X___Your next transmission is scheduled for:   10-25-2009.  Please transmit at any time this day.  If you have a wireless device your transmission will be sent automatically.   Sincerely,  Vella Kohler

## 2010-04-25 NOTE — Medication Information (Signed)
Summary: rov/eac  Anticoagulant Therapy  Managed by: Weston Brass, PharmD Referring MD: Charlies Constable MD PCP: Pearson Grippe Supervising MD: Jens Som MD, Arlys John Indication 1: Atrial Fibrillation (ICD-427.31) Lab Used: [facility] [place] Site: [address] INR POC 1.8 INR RANGE 2 - 3  Dietary changes: no    Health status changes: no    Bleeding/hemorrhagic complications: no    Recent/future hospitalizations: no    Any changes in medication regimen? no    Recent/future dental: no  Any missed doses?: no       Is patient compliant with meds? yes       Allergies: 1)  ! Codeine  Anticoagulation Management History:      The patient is taking warfarin and comes in today for a routine follow up visit.  Positive risk factors for bleeding include an age of 75 years or older.  Negative risk factors for bleeding include no history of CVA/TIA and no history of GI bleeding.  The bleeding index is 'intermediate risk'.  Positive CHADS2 values include History of CHF, History of HTN, and Age > 41 years old.  Negative CHADS2 values include Prior Stroke/CVA/TIA.  The start date was 03/09/2006.  Anticoagulation responsible provider: Jens Som MD, Arlys John.  INR POC: 1.8.  Cuvette Lot#: [id number].  Exp: 07/2010.    Anticoagulation Management Assessment/Plan:      The patient's current anticoagulation dose is Coumadin 5 mg tabs: Use as directed by anticoagulation clinic., Coumadin 2.5 mg tabs: Take as directed by coumadin clinic..  The target INR is 2.0-3.0.  The next INR is due 08/06/2009.  Anticoagulation instructions were given to Ron, [facility] RN.  Results were reviewed/authorized by Weston Brass, PharmD.  She was notified by Weston Brass PharmD.         Prior Anticoagulation Instructions: INR 2.1  Take 1.25 mg (1/2 of a green tablet) on Monday, Wednesday, and Friday, and take 2.5 mg (1/2 of an orange tablet) all other days.  Return to clinic in 4 weeks.    Current Anticoagulation  Instructions: INR 1.8  Take 2.5mg  today (1/2 of orange tablet) then resume same dose of 2.5mg  (1/2 of orange tablet) daily except 1.25mg  (1/2 of green tablet) on Monday, Wednesday and Friday

## 2010-04-25 NOTE — Progress Notes (Signed)
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 Busy x 2 tries. Sherri Rad, RN, BSN  June 11, 2009 5:00 PM  I spoke with the pt. Her PCP, Dr. Pearson Grippe at [facility], wants to give her an injection for osteoporosis called Prolia. She wanted to review this with Dr. Juanda Chance to see if he thought it would be ok. I will review with Dr. Juanda Chance and call the pt back. Sherri Rad, RN, BSN  June 11, 2009 5:46 PM     I discussed the above with Dr. Juanda Chance- he states the pt should be fine to take Prolia. I attempted to call the pt, but her line is busy. I will c/b. Sherri Rad, RN, BSN  June 12, 2009 6:47 PM   PT RETURNING Blanca Friend  June 14, 2009 9:59 AM LMTCB. Ollen Gross, RN, BSN  June 14, 2009 12:02 PM I spoke with pt. Rn let pt. know Dr. Juanda Chance states pt. will be  fine to take Prolia medication. Pt. verbalized understanding.    Follow-up by: Ollen Gross, RN, BSN,  June 14, 2009 2:25 PM

## 2010-04-25 NOTE — Cardiovascular Report (Signed)
Summary: Heart Failure Program Status Report  Heart Failure Program Status Report   Imported By: Roderic Ovens 07/18/2009 14:14:53  _____________________________________________________________________  External Attachment:    Type:   Image     Comment:   External Document

## 2010-04-25 NOTE — Miscellaneous (Signed)
Summary: [facility]   [facility]   Imported By: Roderic Ovens 05/22/2009 16:30:18  _____________________________________________________________________  External Attachment:    Type:   Image     Comment:   External Document

## 2010-04-25 NOTE — Medication Information (Signed)
Summary: rov/tm  Anticoagulant Therapy  Managed by: Cloyde Reams, RN, BSN Referring MD: Charlies Constable MD PCP: Pearson Grippe Supervising MD: Clifton James MD, Cristal Deer Indication 1: Atrial Fibrillation (ICD-427.31) Lab Used: [facility] [place] Site: [address] INR POC 2.7 INR RANGE 2 - 3  Dietary changes: no    Health status changes: no    Bleeding/hemorrhagic complications: yes       Details: R eye blood vessel hemorrages at times.   Recent/future hospitalizations: no    Any changes in medication regimen? yes       Details: Completed abx last week called clinic, no interaction.   Recent/future dental: no  Any missed doses?: no       Is patient compliant with meds? yes       Allergies: 1)  ! Codeine  Anticoagulation Management History:      The patient is taking warfarin and comes in today for a routine follow up visit.  Positive risk factors for bleeding include an age of 75 years or older.  Negative risk factors for bleeding include no history of CVA/TIA and no history of GI bleeding.  The bleeding index is 'intermediate risk'.  Positive CHADS2 values include History of CHF, History of HTN, and Age > 27 years old.  Negative CHADS2 values include Prior Stroke/CVA/TIA.  The start date was 03/09/2006.  Anticoagulation responsible provider: Clifton James MD, Cristal Deer.  INR POC: 2.7.  Cuvette Lot#: [id number].  Exp: 01/2011.    Anticoagulation Management Assessment/Plan:      The patient's current anticoagulation dose is Coumadin 5 mg tabs: Use as directed by anticoagulation clinic..  The target INR is 2.0-3.0.  The next INR is due 02/26/2010.  Anticoagulation instructions were given to patient.  Results were reviewed/authorized by Cloyde Reams, RN, BSN.  She was notified by Cloyde Reams RN.         Prior Anticoagulation Instructions: INR 2.3 Continue 2.5mg s daily except 1.25mg s on Mondays and  Fridays. Recheck in 4 weeks.   Current Anticoagulation Instructions: INR  2.7  Continue on same dosage 2.5mg  daily except 1.25mg  on Mondays and Fridays.  Recheck in 4 weeks.

## 2010-04-25 NOTE — Miscellaneous (Signed)
Summary: [facility]  [facility]   Imported By: Roderic Ovens 05/08/2009 15:17:50  _____________________________________________________________________  External Attachment:    Type:   Image     Comment:   External Document

## 2010-04-25 NOTE — Medication Information (Signed)
Summary: Coumadin Clinic  Anticoagulant Therapy  Managed by: Cloyde Reams, RN, BSN Referring MD: Charlies Constable MD PCP: Pearson Grippe Supervising MD: Jens Som MD, Arlys John Indication 1: Atrial Fibrillation (ICD-427.31) Lab Used: [facility] [place] Site: [address] INR POC 3.6 INR RANGE 2 - 3    Bleeding/hemorrhagic complications: no     Any changes in medication regimen? no     Any missed doses?: no         Allergies: 1)  ! Codeine  Anticoagulation Management History:      Her anticoagulation is being managed by telephone today.  Positive risk factors for bleeding include an age of 22 years or older.  Negative risk factors for bleeding include no history of CVA/TIA and no history of GI bleeding.  The bleeding index is 'intermediate risk'.  Positive CHADS2 values include History of CHF, History of HTN, and Age > 75 years old.  Negative CHADS2 values include Prior Stroke/CVA/TIA.  The start date was 03/09/2006.  Anticoagulation responsible provider: Jens Som MD, Arlys John.  INR POC: 3.6.  Exp: 04/2010.    Anticoagulation Management Assessment/Plan:      The patient's current anticoagulation dose is Coumadin 5 mg tabs: Use as directed by anticoagulation clinic., Coumadin 2.5 mg tabs: Take as directed by coumadin clinic..  The target INR is 2.0-3.0.  The next INR is due 04/16/2009.  Anticoagulation instructions were given to Ron, [facility] RN.  Results were reviewed/authorized by Cloyde Reams, RN, BSN.  She was notified by Cloyde Reams RN.         Prior Anticoagulation Instructions: INR 2.5  Spoke with Ron, [facility] RN while at pt's home instructed to have pt continue to take 2.5mg  daily.  Recheck in 1 week.    Current Anticoagulation Instructions: INR 3.6  Spoke with Ron, [facility] RN while at pt's home.  Advised to have pt skip today's dosage of coumadin, then decr dosage to 2.5mg  daily except 1.25mg  on Fridays.  Pt made aware rx sent to [facility] in [place] for 2.5mg  tablets.  Recheck  PT/INR in 1 week.   Prescriptions: COUMADIN 2.5 MG TABS (WARFARIN SODIUM) Take as directed by coumadin clinic.  #30 x 1   Entered by:   Cloyde Reams RN   Authorized by:   Lenoria Farrier, MD, [facility]   Signed by:   Cloyde Reams RN on 04/09/2009   Method used:   Electronically to        [facility] [phone]* (retail)       [address] [place], [place]  [address]       Ph: [phone] or [phone]       Fax: [phone]   RxID:   [id number]

## 2010-04-25 NOTE — Medication Information (Signed)
Summary: rov/sp  Anticoagulant Therapy  Managed by: Weston Brass, PharmD Referring MD: Charlies Constable MD PCP: Pearson Grippe Supervising MD: Daleen Squibb MD, Maisie Fus Indication 1: Atrial Fibrillation (ICD-427.31) Lab Used: [facility] [place] Site: [address] INR POC 1.9 INR RANGE 2 - 3  Dietary changes: no    Health status changes: no    Bleeding/hemorrhagic complications: no    Recent/future hospitalizations: no    Any changes in medication regimen? yes       Details: increased furosemide today  Recent/future dental: no  Any missed doses?: no       Is patient compliant with meds? yes       Allergies: 1)  ! Codeine  Anticoagulation Management History:      The patient is taking warfarin and comes in today for a routine follow up visit.  Positive risk factors for bleeding include an age of 65 years or older.  Negative risk factors for bleeding include no history of CVA/TIA and no history of GI bleeding.  The bleeding index is 'intermediate risk'.  Positive CHADS2 values include History of CHF, History of HTN, and Age > 52 years old.  Negative CHADS2 values include Prior Stroke/CVA/TIA.  The start date was 03/09/2006.  Anticoagulation responsible provider: Daleen Squibb MD, Maisie Fus.  INR POC: 1.9.  Cuvette Lot#: [id number].  Exp: 10/2010.    Anticoagulation Management Assessment/Plan:      The patient's current anticoagulation dose is Coumadin 5 mg tabs: Use as directed by anticoagulation clinic., Coumadin 2.5 mg tabs: Take as directed by coumadin clinic..  The target INR is 2.0-3.0.  The next INR is due 09/18/2009.  Anticoagulation instructions were given to patient.  Results were reviewed/authorized by Weston Brass, PharmD.  She was notified by Weston Brass PharmD.         Prior Anticoagulation Instructions: INR 2.5  Continue same dose of 2.5mg  daily except 1.25mg  on Monday, Wednesday and Friday   Current Anticoagulation Instructions: INR 1.9  Take 3.75mg  (1/2 of peach and 1/2 of  green tablet) today then resume same dose of 2.5mg  daily except 1.25mg  on Monday, Wednesday and Friday

## 2010-04-25 NOTE — Progress Notes (Signed)
  Phone Note Other Incoming   Reason for Call: Discuss lab or test results Request: Send information Action Taken: Information Sent Details for Reason: PT INR Summary of Call: Recieved lab results from home health nurse.  INR 4.6.  Patient on 2.5mg  of coumadin daily.  I asked her not to give Coumadin dose today.  PT INR 03/26/2009.  Call to office.

## 2010-04-25 NOTE — Miscellaneous (Signed)
Summary: [facility]   [facility]   Imported By: Roderic Ovens 04/24/2009 15:56:08  _____________________________________________________________________  External Attachment:    Type:   Image     Comment:   External Document

## 2010-04-25 NOTE — Miscellaneous (Signed)
Summary: [facility]  [facility]   Imported By: Roderic Ovens 04/25/2009 14:55:55  _____________________________________________________________________  External Attachment:    Type:   Image     Comment:   External Document

## 2010-04-25 NOTE — Medication Information (Signed)
Summary: Coumadin Clinic  Anticoagulant Therapy  Managed by: Cloyde Reams, RN, BSN Referring MD: Charlies Constable MD PCP: Pearson Grippe Supervising MD: Excell Seltzer MD, Casimiro Needle Indication 1: Atrial Fibrillation (ICD-427.31) Lab Used: [facility] [place] Site: [address] INR POC 2.1 INR RANGE 2 - 3    Bleeding/hemorrhagic complications: no     Any changes in medication regimen? no     Any missed doses?: no         Allergies: 1)  ! Codeine  Anticoagulation Management History:      Her anticoagulation is being managed by telephone today.  Positive risk factors for bleeding include an age of 16 years or older.  Negative risk factors for bleeding include no history of CVA/TIA and no history of GI bleeding.  The bleeding index is 'intermediate risk'.  Positive CHADS2 values include History of CHF, History of HTN, and Age > 9 years old.  Negative CHADS2 values include Prior Stroke/CVA/TIA.  The start date was 03/09/2006.  Anticoagulation responsible provider: Excell Seltzer MD, Casimiro Needle.  INR POC: 2.1.  Exp: 04/2010.    Anticoagulation Management Assessment/Plan:      The patient's current anticoagulation dose is Coumadin 5 mg tabs: Use as directed by anticoagulation clinic., Coumadin 2.5 mg tabs: Take as directed by coumadin clinic..  The target INR is 2.0-3.0.  The next INR is due 05/07/2009.  Anticoagulation instructions were given to Ron, [facility] RN.  Results were reviewed/authorized by Cloyde Reams, RN, BSN.  She was notified by Cloyde Reams RN.         Prior Anticoagulation Instructions: INR 3.6  Spoke with Ron, [facility] while at pt's home.  Advised to hold x 1 dose then decr dosage to 2.5mg  daily except 1.25mg  on MWF.  Recheck in 1 week.    Current Anticoagulation Instructions: INR 2.1  Spoke with Ron, [facility] RN while at pt's home.  Advised to continue on same dosage 2.5mg  daily except 1.25mg  MWF.  Recheck in 1 week.

## 2010-04-25 NOTE — Medication Information (Signed)
Summary: Coumadin Clinic  Anticoagulant Therapy  Managed by: Bethena Midget, RN, BSN Referring MD: Charlies Constable MD PCP: Pearson Grippe Supervising MD: Antoine Poche MD, Fayrene Fearing Indication 1: Atrial Fibrillation (ICD-427.31) Lab Used: [facility]  Site: [address] INR POC 3.0 INR RANGE 2 - 3  Dietary changes: no    Health status changes: no    Bleeding/hemorrhagic complications: no    Recent/future hospitalizations: no    Any changes in medication regimen? no    Recent/future dental: no  Any missed doses?: no       Is patient compliant with meds? yes       Allergies: 1)  ! Codeine  Anticoagulation Management History:      Her anticoagulation is being managed by telephone today.  Positive risk factors for bleeding include an age of 75 years or older.  Negative risk factors for bleeding include no history of CVA/TIA and no history of GI bleeding.  The bleeding index is 'intermediate risk'.  Positive CHADS2 values include History of CHF, History of HTN, and Age > 60 years old.  Negative CHADS2 values include Prior Stroke/CVA/TIA.  The start date was 03/09/2006.  Anticoagulation responsible provider: Antoine Poche MD, Fayrene Fearing.  INR POC: 3.0.    Anticoagulation Management Assessment/Plan:      The patient's current anticoagulation dose is Coumadin 5 mg tabs: Use as directed by anticoagulation clinic., Coumadin 2.5 mg tabs: Take as directed by coumadin clinic..  The target INR is 2.0-3.0.  The next INR is due 04/23/2009.  Anticoagulation instructions were given to Ron, [facility] RN.  Results were reviewed/authorized by Bethena Midget, RN, BSN.  She was notified by Bethena Midget, RN, BSN.         Prior Anticoagulation Instructions: INR 3.6  Spoke with Ron, [facility] RN while at pt's home.  Advised to have pt skip today's dosage of coumadin, then decr dosage to 2.5mg  daily except 1.25mg  on Fridays.  Pt made aware rx sent to [facility] in [place] for 2.5mg  tablets.  Recheck PT/INR in 1 week.     Current Anticoagulation Instructions: INR 3.0 Change dose to 2.5mg s daily except 1.25mg s on Mondays and Fridays. Recheck in one week.  Orders given to [facility] while at home with pt.

## 2010-04-25 NOTE — Medication Information (Signed)
Summary: rov/sp  Anticoagulant Therapy  Managed by: Weston Brass, PharmD Referring MD: Charlies Constable MD PCP: Pearson Grippe Supervising MD: Tenny Craw MD, Gunnar Fusi Indication 1: Atrial Fibrillation (ICD-427.31) Lab Used: [facility] [place] Site: [address] INR POC 2.5 INR RANGE 2 - 3  Dietary changes: no    Health status changes: no    Bleeding/hemorrhagic complications: no    Recent/future hospitalizations: no    Any changes in medication regimen? no    Recent/future dental: no  Any missed doses?: no       Is patient compliant with meds? yes       Allergies: 1)  ! Codeine  Anticoagulation Management History:      The patient is taking warfarin and comes in today for a routine follow up visit.  Positive risk factors for bleeding include an age of 75 years or older.  Negative risk factors for bleeding include no history of CVA/TIA and no history of GI bleeding.  The bleeding index is 'intermediate risk'.  Positive CHADS2 values include History of CHF, History of HTN, and Age > 47 years old.  Negative CHADS2 values include Prior Stroke/CVA/TIA.  The start date was 03/09/2006.  Anticoagulation responsible provider: Tenny Craw MD, Gunnar Fusi.  INR POC: 2.5.  Cuvette Lot#: [id number].  Exp: 10/2010.    Anticoagulation Management Assessment/Plan:      The patient's current anticoagulation dose is Coumadin 5 mg tabs: Use as directed by anticoagulation clinic., Coumadin 2.5 mg tabs: Take as directed by coumadin clinic..  The target INR is 2.0-3.0.  The next INR is due 09/03/2009.  Anticoagulation instructions were given to Ron, [facility] RN.  Results were reviewed/authorized by Weston Brass, PharmD.  She was notified by Weston Brass PharmD.         Prior Anticoagulation Instructions: INR 1.8  Take 2.5mg  today (1/2 of orange tablet) then resume same dose of 2.5mg  (1/2 of orange tablet) daily except 1.25mg  (1/2 of green tablet) on Monday, Wednesday and Friday   Current Anticoagulation Instructions: INR  2.5  Continue same dose of 2.5mg  daily except 1.25mg  on Monday, Wednesday and Friday

## 2010-04-25 NOTE — Letter (Signed)
Summary: Remote Device Check  [facility], Main Office  [address]   [place], [place] [address]   Phone: [phone]  Fax: [phone]     February 21, 2010 MRN: [id number]   [name] [address] [place], [place]  [address]   Dear Ms. Brafford,   Your remote transmission was recieved and reviewed by your physician.  All diagnostics were within normal limits for you.  _____Your next transmission is scheduled for:                       .  Please transmit at any time this day.  If you have a wireless device your transmission will be sent automatically.  _X_____Your next office visit is scheduled for:  February 2012 with Dr. Ladona Ridgel.                            . Please call our office to schedule an appointment.    Sincerely,  Altha Harm, LPN

## 2010-04-25 NOTE — Progress Notes (Signed)
Summary: FUROSEMIDE  Phone Note Call from Patient   Caller: Patient Reason for Call: Talk to Nurse Summary of Call: pt calling stating rx for furosimide was denied, however I see it was approved and sent electronically on the 12th of jan, can we resend? Initial call taken by: Glynda Jaeger,  April 16, 2010 3:09 PM    Prescriptions: FUROSEMIDE 40 MG TABS (FUROSEMIDE) Take one and a half tablets by mouth once daily  #45 x 5   Entered by:   Caralee Ates CMA   Authorized by:   Marca Ancona, MD   Signed by:   Caralee Ates CMA on 04/16/2010   Method used:   Electronically to        [facility] [phone]* (retail)       [address] [place], [place]  [address]       Ph: [phone] or [phone]       Fax: [phone]   RxID:   [id number]

## 2010-04-25 NOTE — Medication Information (Signed)
Summary: rov/sl  Anticoagulant Therapy  Managed by: Leota Sauers, PharmD, BCPS, CPP Referring MD: Charlies Constable MD PCP: Pearson Grippe Supervising MD: Eden Emms MD, Theron Arista Indication 1: Atrial Fibrillation (ICD-427.31) Lab Used: [facility] [place] Site: [address] INR POC 2.5 INR RANGE 2 - 3  Dietary changes: no    Health status changes: no    Bleeding/hemorrhagic complications: no    Recent/future hospitalizations: no    Any changes in medication regimen? no    Recent/future dental: no  Any missed doses?: no       Is patient compliant with meds? yes       Allergies: 1)  ! Codeine  Anticoagulation Management History:      The patient is taking warfarin and comes in today for a routine follow up visit.  Positive risk factors for bleeding include an age of 75 years or older.  Negative risk factors for bleeding include no history of CVA/TIA and no history of GI bleeding.  The bleeding index is 'intermediate risk'.  Positive CHADS2 values include History of CHF, History of HTN, and Age > 20 years old.  Negative CHADS2 values include Prior Stroke/CVA/TIA.  The start date was 03/09/2006.  Anticoagulation responsible Avyanna Spada: Eden Emms MD, Theron Arista.  INR POC: 2.5.  Cuvette Lot#: [id number].  Exp: 12/2010.    Anticoagulation Management Assessment/Plan:      The patient's current anticoagulation dose is Coumadin 5 mg tabs: Use as directed by anticoagulation clinic., Coumadin 2.5 mg tabs: Take as directed by coumadin clinic..  The target INR is 2.0-3.0.  The next INR is due 12/25/2009.  Anticoagulation instructions were given to patient.  Results were reviewed/authorized by Leota Sauers, PharmD, BCPS, CPP.  She was notified by Leota Sauers, PharmD, BCPS.         Prior Anticoagulation Instructions: INR 2.4  Continue taking Coumadin 0.5 tab (2.5 mg) on Sun, Tues, Wed, Thur, Sat and Coumadin 0.5 tab (1.25 mg) on Mon, Fri. Return to clinic on 9/6 to coincide with appointment with Dr.  Juanda Chance.   Current Anticoagulation Instructions: INR 2.5   Continue taking one-half of the 5 mg tablet every day except for one-half of the 2.5 mg tablet on Monday an Friday.  We will see you in four weeks.

## 2010-04-25 NOTE — Medication Information (Signed)
Summary: rov/sp  Anticoagulant Therapy  Managed by: Weston Brass, PharmD Referring MD: Charlies Constable MD PCP: Pearson Grippe Supervising MD: Juanda Chance MD, Hykeem Ojeda Indication 1: Atrial Fibrillation (ICD-427.31) Lab Used: [facility] [place] Site: [address] INR POC 1.9 INR RANGE 2 - 3  Dietary changes: no    Health status changes: no    Bleeding/hemorrhagic complications: no    Recent/future hospitalizations: no    Any changes in medication regimen? no    Recent/future dental: no  Any missed doses?: no       Is patient compliant with meds? yes      Comments: given Vytorin 10/40mg  samples Lot [id number] Exp4/2012  Allergies: 1)  ! Codeine  Anticoagulation Management History:      The patient is taking warfarin and comes in today for a routine follow up visit.  Positive risk factors for bleeding include an age of 5 years or older.  Negative risk factors for bleeding include no history of CVA/TIA and no history of GI bleeding.  The bleeding index is 'intermediate risk'.  Positive CHADS2 values include History of CHF, History of HTN, and Age > 38 years old.  Negative CHADS2 values include Prior Stroke/CVA/TIA.  The start date was 03/09/2006.  Anticoagulation responsible provider: Juanda Chance MD, Smitty Cords.  INR POC: 1.9.  Cuvette Lot#: [id number].  Exp: 11/2010.    Anticoagulation Management Assessment/Plan:      The patient's current anticoagulation dose is Coumadin 5 mg tabs: Use as directed by anticoagulation clinic., Coumadin 2.5 mg tabs: Take as directed by coumadin clinic..  The target INR is 2.0-3.0.  The next INR is due 10/30/2009.  Anticoagulation instructions were given to patient.  Results were reviewed/authorized by Weston Brass, PharmD.  She was notified by Dillard Cannon.         Prior Anticoagulation Instructions: INR 2.2  Continue same dose of 2.5mg  every day except 1.25mg  on Monday, Wednesday and Friday.    Current Anticoagulation Instructions: INR 1.9  Change to 2.5mg   on Sunday, Tuesday, Wednesday, Thursday, and Saturday and 1.25mg  on Monda and Friday.

## 2010-04-25 NOTE — Assessment & Plan Note (Signed)
Summary: per check out   Visit Type:  Follow-up Primary Provider:  Pearson Grippe  CC:  Chest pains.  History of Present Illness: The patient is 77 years old returned for evaluation of CAD and CHF. She had a remote anterior wall MI treated with a stent to the LAD. She has an ejection fraction of 25% and a history of systolic CHF. She also has an LV thrombus which is treated with Coumadin. She is a backup VVI ICD.  She feels she's been doing well. She's had no chest pain. She has occasional fluttering in her chest.  Current Medications (verified): 1)  Carvedilol 25 Mg Tabs (Carvedilol) .Marland Kitchen.. 1 Tab Two Times A Day 2)  Vytorin 10-40 Mg Tabs (Ezetimibe-Simvastatin) .... Take One Tablet By Mouth Dailyat Bedtime 3)  Furosemide 40 Mg Tabs (Furosemide) .... Take One and A Half Tablets By Mouth Once Daily 4)  Aspirin 81 Mg Tbec (Aspirin) .... Take One Tablet By Mouth Daily 5)  Glucosamine 500 Mg Caps (Glucosamine Sulfate) .... Take 1 Capsule By Mouth Once A Day 6)  Multivitamins  Tabs (Multiple Vitamin) .... Take 1 Tablet By Mouth Once A Day 7)  Vitamin D 1000 Unit Tabs (Cholecalciferol) .... Take 1 Tablet By Mouth Once A Day 8)  Fish Oil 1000 Mg Caps (Omega-3 Fatty Acids) .... Take 1 Capsule By Mouth Once A Day 9)  Potassium Chloride Cr 10 Meq Cr-Caps (Potassium Chloride) .... Take One Tablet By Mouth Daily 10)  Coumadin 5 Mg Tabs (Warfarin Sodium) .... Use As Directed By Anticoagulation Clinic. 11)  Cozaar 25 Mg Tabs (Losartan Potassium) .... One Twice A Day 12)  Spironolactone 25 Mg Tabs (Spironolactone) .... Take 1 Tablet By Mouth Once A Day 13)  Nitrostat 0.4 Mg Subl (Nitroglycerin) .Marland Kitchen.. 1 Tablet Under Tongue At Onset of Chest Pain; You May Repeat Every 5 Minutes For Up To 3 Doses. 14)  Zoloft 50 Mg Tabs (Sertraline Hcl) .... Take 1 Tablet By Mouth Once A Day  Allergies: 1)  ! Codeine  Past History:  Past Medical History: Reviewed history from 06/26/2008 and no changes required. Current  Problems:  HYPERCHOLESTEROLEMIA (ICD-272.0) ARTHRITIS (ICD-716.90) VENTRICULAR ANEURYSM, LEFT (ICD-414.10) UNSPECIFIED SYSTOLIC HEART FAILURE (ICD-428.20) ISCHEMIC CARDIOMYOPATHY (ICD-414.8) CAD (ICD-414.00) HYPERLIPIDEMIA (ICD-272.4) HYPERTENSION (ICD-401.9) SHORTNESS OF BREATH (ICD-786.05) 1. Coronary artery disease status post anterior wall myocardial     infarction 2006 treated with a Cypher drug-eluting stent to left     anterior descending. 2. Ischemic cardiomyopathy, ejection fraction 20%. 3. Systolic heart failure, euvolemic class III. 4. Status post backup ventricular demand pacing implantable     cardioverter-defibrillator. 5. Hypertension. 6. Hyperlipidemia. 7. Left ventricular thrombus on Coumadin therapy.   Review of Systems       ROS is negative except as outlined in HPI.   Vital Signs:  Patient profile:   76 year old female Height:      60 inches Weight:      148.50 pounds BMI:     29.11 Pulse rate:   60 / minute Pulse rhythm:   irregular Resp:     18 per minute BP sitting:   134 / 80  (left arm) Cuff size:   large  Vitals Entered By: Vikki Ports (February 26, 2010 2:02 PM)  Physical Exam  Additional Exam:  Gen. Well-nourished, in no distress   Neck: No JVD, thyroid not enlarged, no carotid bruits Lungs: No tachypnea, clear without rales, rhonchi or wheezes Cardiovascular: Rhythm regular, PMI not displaced,  heart sounds  normal, no murmurs or gallops, no peripheral edema, pulses normal in all 4 extremities. Abdomen: BS normal, abdomen soft and non-tender without masses or organomegaly, no hepatosplenomegaly. MS: No deformities, no cyanosis or clubbing   Neuro:  No focal sns   Skin:  no lesions     ICD Specifications Following MD:  Sherryl Manges, MD     ICD Vendor:  Medtronic     ICD Model Number:  7232     ICD Serial Number:  [id number] ICD DOI:  11/28/2004     ICD Implanting MD:  Lewayne Bunting, MD  Lead 1:    Location: [facility]     DOI: 11/28/2004      Model #: [id number]     Serial #: [id number]     Status: capped Lead 2:    Location: [facility]     DOI: 06/01/2007     Model #: 5621     Serial #: [id number]     Status: active  Indications::  NSVT, LV DYSFUNCTION   ICD Follow Up ICD Dependent:  No      Episodes Coumadin:  Yes  Brady Parameters Mode VVI     Lower Rate Limit:  40      Tachy Zones VF:  200     VT:  250     VT1:  171     Impression & Recommendations:  Problem # 1:  CAD (ICD-414.00) She has had a previous myocardial infarction and stent to the LAD. She has had no chest pain and this problem appears stable. Her updated medication list for this problem includes:    Carvedilol 25 Mg Tabs (Carvedilol) .Marland Kitchen... 1 tab two times a day    Aspirin 81 Mg Tbec (Aspirin) .Marland Kitchen... Take one tablet by mouth daily    Coumadin 5 Mg Tabs (Warfarin sodium) ..... Use as directed by anticoagulation clinic.    Nitrostat 0.4 Mg Subl (Nitroglycerin) .Marland Kitchen... 1 tablet under tongue at onset of chest pain; you may repeat every 5 minutes for up to 3 doses.  Problem # 2:  UNSPECIFIED SYSTOLIC HEART FAILURE (ICD-428.20) She has systolic heart failure. She appears euvolemic today. This problem is stable. Her updated medication list for this problem includes:    Carvedilol 25 Mg Tabs (Carvedilol) .Marland Kitchen... 1 tab two times a day    Furosemide 40 Mg Tabs (Furosemide) .Marland Kitchen... Take one and a half tablets by mouth once daily    Aspirin 81 Mg Tbec (Aspirin) .Marland Kitchen... Take one tablet by mouth daily    Coumadin 5 Mg Tabs (Warfarin sodium) ..... Use as directed by anticoagulation clinic.    Cozaar 25 Mg Tabs (Losartan potassium) ..... One twice a day    Spironolactone 25 Mg Tabs (Spironolactone) .Marland Kitchen... Take 1 tablet by mouth once a day    Nitrostat 0.4 Mg Subl (Nitroglycerin) .Marland Kitchen... 1 tablet under tongue at onset of chest pain; you may repeat every 5 minutes for up to 3 doses.  Problem # 3:  VENTRICULAR ANEURYSM, LEFT (ICD-414.10) She has had a LV thrombus by echo and is on chronic Coumadin  therapy.  Other Orders: EKG w/ Interpretation (93000) TLB-BMP (Basic Metabolic Panel-BMET) (80048-METABOL)  Patient Instructions: 1)  Labwork today: bmet (428.22;414.01). 2)  Your physician recommends that you schedule a follow-up appointment in: 3 months with Dr. Shirlee Latch. 3)  Start Tramadol 50mg  two times a day as needed for pain. Prescriptions: VYTORIN 10-40 MG TABS (EZETIMIBE-SIMVASTATIN) Take one tablet by mouth dailyat bedtime  #28 x 0  Entered by:   Sherri Rad, RN, BSN   Authorized by:   Lenoria Farrier, MD, [facility]   Signed by:   Sherri Rad, RN, BSN on 02/26/2010   Method used:   Samples Given   RxID:   [id number] TRAMADOL HCL 50 MG TABS (TRAMADOL HCL) take one tab by mouth two times a day as needed  #60 x 0   Entered by:   Sherri Rad, RN, BSN   Authorized by:   Lenoria Farrier, MD, [facility]   Signed by:   Sherri Rad, RN, BSN on 02/26/2010   Method used:   Electronically to        [facility] [phone]* (retail)       [address] [place], [place]  [address]       Ph: [phone] or [phone]       Fax: [phone]   RxID:   [id number]

## 2010-04-25 NOTE — Medication Information (Signed)
Summary: rov/eac  Anticoagulant Therapy  Managed by: Eda Keys, PharmD Referring MD: Charlies Constable MD PCP: Pearson Grippe Supervising MD: Tenny Craw MD, Gunnar Fusi Indication 1: Atrial Fibrillation (ICD-427.31) Lab Used: [facility] [place] Site: [address] INR POC 2.1 INR RANGE 2 - 3  Dietary changes: no    Health status changes: no    Bleeding/hemorrhagic complications: no    Recent/future hospitalizations: no    Any changes in medication regimen? no    Recent/future dental: no  Any missed doses?: no       Is patient compliant with meds? yes      Comments: Gave Vytorin 10/40 samples #28, Lot [id number], Exp 05/2010  Allergies: 1)  ! Codeine  Anticoagulation Management History:      The patient is taking warfarin and comes in today for a routine follow up visit.  Positive risk factors for bleeding include an age of 40 years or older.  Negative risk factors for bleeding include no history of CVA/TIA and no history of GI bleeding.  The bleeding index is 'intermediate risk'.  Positive CHADS2 values include History of CHF, History of HTN, and Age > 92 years old.  Negative CHADS2 values include Prior Stroke/CVA/TIA.  The start date was 03/09/2006.  Anticoagulation responsible provider: Tenny Craw MD, Gunnar Fusi.  INR POC: 2.1.  Cuvette Lot#: [id number].  Exp: 07/2010.    Anticoagulation Management Assessment/Plan:      The patient's current anticoagulation dose is Coumadin 5 mg tabs: Use as directed by anticoagulation clinic., Coumadin 2.5 mg tabs: Take as directed by coumadin clinic..  The target INR is 2.0-3.0.  The next INR is due 07/16/2009.  Anticoagulation instructions were given to Ron, [facility] RN.  Results were reviewed/authorized by Eda Keys, PharmD.  She was notified by Eda Keys.         Prior Anticoagulation Instructions: INR 2.8  Continue current dosing schedule.  Take 2.5 mg (1/2 of a pink) on Saturday, Sunday, Tuesday, and Thursday.  Take 1.25 mg (1/2 of a green)  on Monday, Wednesday, and Friday.  Return to clinic in 4 weeks.    Current Anticoagulation Instructions: INR 2.1  Take 1.25 mg (1/2 of a green tablet) on Monday, Wednesday, and Friday, and take 2.5 mg (1/2 of an orange tablet) all other days.  Return to clinic in 4 weeks.

## 2010-04-25 NOTE — Medication Information (Signed)
Summary: rov/tm  Anticoagulant Therapy  Managed by: Eda Keys, PharmD Referring MD: Charlies Constable MD PCP: Pearson Grippe Supervising MD: Clifton James MD, Cristal Deer Indication 1: Atrial Fibrillation (ICD-427.31) Lab Used: [facility] [place] Site: [address] INR POC 2.8 INR RANGE 2 - 3  Dietary changes: no    Health status changes: no    Bleeding/hemorrhagic complications: no    Recent/future hospitalizations: no    Any changes in medication regimen? no    Recent/future dental: no  Any missed doses?: no       Is patient compliant with meds? yes       Allergies: 1)  ! Codeine  Anticoagulation Management History:      Positive risk factors for bleeding include an age of 75 years or older.  Negative risk factors for bleeding include no history of CVA/TIA and no history of GI bleeding.  The bleeding index is 'intermediate risk'.  Positive CHADS2 values include History of CHF, History of HTN, and Age > 31 years old.  Negative CHADS2 values include Prior Stroke/CVA/TIA.  The start date was 03/09/2006.  Anticoagulation responsible provider: Clifton James MD, Cristal Deer.  INR POC: 2.8.  Cuvette Lot#: [id number].  Exp: 07/2010.    Anticoagulation Management Assessment/Plan:      The patient's current anticoagulation dose is Coumadin 5 mg tabs: Use as directed by anticoagulation clinic., Coumadin 2.5 mg tabs: Take as directed by coumadin clinic..  The target INR is 2.0-3.0.  The next INR is due 06/19/2009.  Anticoagulation instructions were given to Ron, [facility] RN.  Results were reviewed/authorized by Eda Keys, PharmD.  She was notified by Eda Keys.         Prior Anticoagulation Instructions: XBJ4.3 Continue 2.5mg s daily except 1.25mg s MWF. Recheck on 05/22/09 with MD appt.   Current Anticoagulation Instructions: INR 2.8  Continue current dosing schedule.  Take 2.5 mg (1/2 of a pink) on Saturday, Sunday, Tuesday, and Thursday.  Take 1.25 mg (1/2 of a green) on  Monday, Wednesday, and Friday.  Return to clinic in 4 weeks.

## 2010-04-25 NOTE — Letter (Signed)
Summary: Remote Device Check  [employer], Main Office  [address]   [place], [place] [address]   Phone: [phone]  Fax: [phone]     November 09, 2009 MRN: [id number]   [facility] [address] [place], [place]  [address]   Dear Ms. Corral,   Your remote transmission was recieved and reviewed by your physician.  All diagnostics were within normal limits for you.  __X___Your next transmission is scheduled for: 01-31-2010.  Please transmit at any time this day.  If you have a wireless device your transmission will be sent automatically.   Sincerely,  Vella Kohler

## 2010-04-25 NOTE — Assessment & Plan Note (Signed)
Summary: [name]   Primary Provider:  Pearson Grippe  CC:  Device Check.  History of Present Illness: [name] (pronounced Pee.) return for followup management of CAD and CHF. She had an anterior MI number of years ago with an ejection fraction of 25% not recover. She has had systolic CHF but has had only occasional volume overload. She also has had an LV thrombus and as been on chronic Coumadin therapy. She had an ICD implanted in his head and this is programmed at VVI backup of 40. She was hospitalized over the holidays with pneumonia.  She recovered.  She denies c/p, sob, or peripheral edema.  Current Medications (verified): 1)  Carvedilol 25 Mg Tabs (Carvedilol) .Marland Kitchen.. 1 Tab Two Times A Day 2)  Vytorin 10-40 Mg Tabs (Ezetimibe-Simvastatin) .... Take One Tablet By Mouth Dailyat Bedtime 3)  Furosemide 40 Mg Tabs (Furosemide) .... Take One Tablet By Mouth Daily. 4)  Aspirin 81 Mg Tbec (Aspirin) .... Take One Tablet By Mouth Daily 5)  Glucosamine .... Daily 6)  Multi Vitamin .... Daily 7)  Vitamin D .... Daily 8)  Fish Oil .... Daily 9)  Potassium Chloride Cr 10 Meq Cr-Caps (Potassium Chloride) .... Take One Tablet By Mouth Daily 10)  Coumadin 5 Mg Tabs (Warfarin Sodium) .... Use As Directed By Anticoagulation Clinic. 11)  Enalapril Maleate 20 Mg Tabs (Enalapril Maleate) .... Take 1 Tablet By Mouth Twice A Day 12)  Spironolactone 25 Mg Tabs (Spironolactone) .... Take 1 Tablet By Mouth Once A Day 13)  Nitrostat 0.4 Mg Subl (Nitroglycerin) .Marland Kitchen.. 1 Tablet Under Tongue At Onset of Chest Pain; You May Repeat Every 5 Minutes For Up To 3 Doses. 14)  Coumadin 2.5 Mg Tabs (Warfarin Sodium) .... Take As Directed By Coumadin Clinic. 15)  Zoloft Tabs (Sertraline Hcl) .... Take 1 By Mouth Once Daily  Allergies: 1)  ! Codeine  Past History:  Past Medical History: Last updated: 06/26/2008 Current Problems:  HYPERCHOLESTEROLEMIA (ICD-272.0) ARTHRITIS (ICD-716.90) VENTRICULAR ANEURYSM,  LEFT (ICD-414.10) UNSPECIFIED SYSTOLIC HEART FAILURE (ICD-428.20) ISCHEMIC CARDIOMYOPATHY (ICD-414.8) CAD (ICD-414.00) HYPERLIPIDEMIA (ICD-272.4) HYPERTENSION (ICD-401.9) SHORTNESS OF BREATH (ICD-786.05) 1. Coronary artery disease status post anterior wall myocardial     infarction 2006 treated with a Cypher drug-eluting stent to left     anterior descending. 2. Ischemic cardiomyopathy, ejection fraction 20%. 3. Systolic heart failure, euvolemic class III. 4. Status post backup ventricular demand pacing implantable     cardioverter-defibrillator. 5. Hypertension. 6. Hyperlipidemia. 7. Left ventricular thrombus on Coumadin therapy.   Review of Systems  The patient denies chest pain, syncope, dyspnea on exertion, and peripheral edema.    Vital Signs:  Patient profile:   77 year old female Height:      60 inches Weight:      146 pounds BMI:     28.62 Pulse rate:   65 / minute BP sitting:   144 / 64  (left arm) Cuff size:   regular  Vitals Entered By: Stanton Kidney, EMT-P (May 01, 2009 1:57 PM)  Physical Exam  General:  Elderly, well developed, well nourished, in no acute distress.  HEENT: normal Neck: supple. No JVD. Carotids 2+ bilaterally no bruits Cor: RRR no rubs, gallops or murmur Lungs: CTA.  Well healed ICD incision. Ab: soft, nontender. nondistended. No HSM. Good bowel sounds Ext: warm. no cyanosis, clubbing or edema Neuro: alert and oriented. Grossly nonfocal. affect pleasant     ICD Specifications Following MD:  Sherryl Manges, MD     ICD  Vendor:  [employer]     ICD Model Number:  7232     ICD Serial Number:  [id number] ICD DOI:  11/28/2004     ICD Implanting MD:  Lewayne Bunting, MD  Lead 1:    Location: [facility]     DOI: 11/28/2004     Model #: 4098     Serial #: [id number]     Status: capped Lead 2:    Location: RV     DOI: 06/01/2007     Model #: 5621     Serial #: [id number]     Status: active  Indications::  NSVT, LV DYSFUNCTION   ICD Follow Up Remote  Check?  No Battery Voltage:  3.09 V     Charge Time:  7.59 seconds     Underlying rhythm:  SR ICD Dependent:  No       ICD Device Measurements Right Ventricle:  Amplitude: 10.9 mV, Impedance: 392 ohms, Threshold: 1.5 V at 0.6 msec Shock Impedance: 43/48 ohms   Episodes Coumadin:  Yes Shock:  0     ATP:  0     Nonsustained:  0     Ventricular Pacing:  <0.1%  Brady Parameters Mode VVI     Lower Rate Limit:  40      Tachy Zones VF:  200     VT:  250     VT1:  171     Next Remote Date:  07/31/2009     Next Cardiology Appt Due:  04/24/2010 Tech Comments:  No parameter changes.  Device function normal.  [facility]link transmissions every 3 months.  ROV 1 year Dr. Ladona Ridgel. Altha Harm, LPN  May 01, 2009 2:04 PM  MD Comments:  Agree with above.  Impression & Recommendations:  Problem # 1:  AUTOMATIC IMPLANTABLE CARDIAC DEFIBRILLATOR SITU (ICD-V45.02) Her device is working normally.  Will recheck in several months.  Problem # 2:  ISCHEMIC CARDIOMYOPATHY (ICD-414.8) she denies anginal symptoms.  Will followup in several months. Her updated medication list for this problem includes:    Carvedilol 25 Mg Tabs (Carvedilol) .Marland Kitchen... 1 tab two times a day    Furosemide 40 Mg Tabs (Furosemide) .Marland Kitchen... Take one tablet by mouth daily.    Aspirin 81 Mg Tbec (Aspirin) .Marland Kitchen... Take one tablet by mouth daily    Coumadin 5 Mg Tabs (Warfarin sodium) ..... Use as directed by anticoagulation clinic.    Enalapril Maleate 20 Mg Tabs (Enalapril maleate) .Marland Kitchen... Take 1 tablet by mouth twice a day    Spironolactone 25 Mg Tabs (Spironolactone) .Marland Kitchen... Take 1 tablet by mouth once a day    Nitrostat 0.4 Mg Subl (Nitroglycerin) .Marland Kitchen... 1 tablet under tongue at onset of chest pain; you may repeat every 5 minutes for up to 3 doses.    Coumadin 2.5 Mg Tabs (Warfarin sodium) .Marland Kitchen... Take as directed by coumadin clinic.  Problem # 3:  HYPERTENSION (ICD-401.9) A low sodium diet is recommended.  Continue current meds. Her updated  medication list for this problem includes:    Carvedilol 25 Mg Tabs (Carvedilol) .Marland Kitchen... 1 tab two times a day    Furosemide 40 Mg Tabs (Furosemide) .Marland Kitchen... Take one tablet by mouth daily.    Aspirin 81 Mg Tbec (Aspirin) .Marland Kitchen... Take one tablet by mouth daily    Enalapril Maleate 20 Mg Tabs (Enalapril maleate) .Marland Kitchen... Take 1 tablet by mouth twice a day    Spironolactone 25 Mg Tabs (Spironolactone) .Marland Kitchen... Take 1 tablet by mouth once a  day  Patient Instructions: 1)  Your physician recommends that you schedule a follow-up appointment in:  1 year with Dr. Ladona Ridgel

## 2010-04-25 NOTE — Cardiovascular Report (Signed)
Summary: Office Visit Remote   Office Visit Remote   Imported By: Roderic Ovens 08/10/2009 15:22:57  _____________________________________________________________________  External Attachment:    Type:   Image     Comment:   External Document

## 2010-04-25 NOTE — Medication Information (Signed)
Summary: Coumadin Clinic  Anticoagulant Therapy  Managed by: Cloyde Reams, RN, BSN Referring MD: Charlies Constable MD PCP: Pearson Grippe Supervising MD: Tenny Craw MD, Gunnar Fusi Indication 1: Atrial Fibrillation (ICD-427.31) Lab Used: [facility] [place] Site: [address] INR POC 2.5 INR RANGE 2 - 3    Bleeding/hemorrhagic complications: no     Any changes in medication regimen? no     Any missed doses?: no         Allergies: 1)  ! Codeine  Anticoagulation Management History:      Her anticoagulation is being managed by telephone today.  Positive risk factors for bleeding include an age of 16 years or older.  Negative risk factors for bleeding include no history of CVA/TIA and no history of GI bleeding.  The bleeding index is 'intermediate risk'.  Positive CHADS2 values include History of CHF, History of HTN, and Age > 52 years old.  Negative CHADS2 values include Prior Stroke/CVA/TIA.  The start date was 03/09/2006.  Anticoagulation responsible provider: Tenny Craw MD, Gunnar Fusi.  INR POC: 2.5.  Exp: 04/2010.    Anticoagulation Management Assessment/Plan:      The patient's current anticoagulation dose is Coumadin 5 mg tabs: Use as directed by anticoagulation clinic..  The target INR is 2.0-3.0.  The next INR is due 04/09/2009.  Anticoagulation instructions were given to Ron, [facility] RN.  Results were reviewed/authorized by Cloyde Reams, RN, BSN.  She was notified by Cloyde Reams RN.         Prior Anticoagulation Instructions: INR 1.5  Change to 2.5 mg daily.    Orders to Ron, RN at [facility] at 1400 on 03/28/2009.  The patient is to hold four doses of coumadin.  The dosage to be resumed includes:   Current Anticoagulation Instructions: INR 2.5  Spoke with Ron, [facility] RN while at pt's home instructed to have pt continue to take 2.5mg  daily.  Recheck in 1 week.

## 2010-04-25 NOTE — Medication Information (Signed)
Summary: rov/sp  Anticoagulant Therapy  Managed by: Cloyde Reams, RN, BSN Referring MD: Charlies Constable MD PCP: Pearson Grippe Supervising MD: Gala Romney MD, Reuel Boom Indication 1: Atrial Fibrillation (ICD-427.31) Lab Used: [facility] [place] Site: [address] INR POC 2.6 INR RANGE 2 - 3  Dietary changes: no    Health status changes: no    Bleeding/hemorrhagic complications: no    Recent/future hospitalizations: no    Any changes in medication regimen? no    Recent/future dental: no  Any missed doses?: no       Is patient compliant with meds? yes       Allergies: 1)  ! Codeine  Anticoagulation Management History:      The patient is taking warfarin and comes in today for a routine follow up visit.  Positive risk factors for bleeding include an age of 75 years or older.  Negative risk factors for bleeding include no history of CVA/TIA and no history of GI bleeding.  The bleeding index is 'intermediate risk'.  Positive CHADS2 values include History of CHF, History of HTN, and Age > 67 years old.  Negative CHADS2 values include Prior Stroke/CVA/TIA.  The start date was 03/09/2006.  Anticoagulation responsible provider: Bensimhon MD, Reuel Boom.  INR POC: 2.6.  Cuvette Lot#: [id number].  Exp: 04/2011.    Anticoagulation Management Assessment/Plan:      The patient's current anticoagulation dose is Coumadin 5 mg tabs: Use as directed by anticoagulation clinic..  The target INR is 2.0-3.0.  The next INR is due 04/30/2010.  Anticoagulation instructions were given to patient.  Results were reviewed/authorized by Cloyde Reams, RN, BSN.  She was notified by Cloyde Reams RN.         Prior Anticoagulation Instructions: INR 2.1  Continue same dose of 2.5mg  daily except 1.25mg  on Monday and Friday.  Recheck INR in 4 weeks.   Current Anticoagulation Instructions: INR 2.6  Continue on same dosage 2.5mg  daily except 1.25mg  on Mondays and Fridays.  Recheck in 4 weeks.

## 2010-04-25 NOTE — Medication Information (Signed)
Summary: Coumadin Clinic  Anticoagulant Therapy  Managed by: Shelby Dubin, PharmD, BCPS, CPP Referring MD: Charlies Constable MD PCP: Pearson Grippe Supervising MD: Riley Kill MD, Maisie Fus Indication 1: Atrial Fibrillation (ICD-427.31) Lab Used: [facility] [place] Site: [address] INR POC 2.3 INR RANGE 2 - 3  Dietary changes: no    Health status changes: no    Bleeding/hemorrhagic complications: no    Recent/future hospitalizations: no    Any changes in medication regimen? no    Recent/future dental: no  Any missed doses?: yes     Details: held yesterday's dose..mp  Is patient compliant with meds? yes      Comments: Ron, RN at [facility] called at 1200.Marland Kitchenmp  Allergies (verified): 1)  ! Codeine  Anticoagulation Management History:      Her anticoagulation is being managed by telephone today.  Positive risk factors for bleeding include an age of 75 years or older.  Negative risk factors for bleeding include no history of CVA/TIA and no history of GI bleeding.  The bleeding index is 'intermediate risk'.  Positive CHADS2 values include History of CHF, History of HTN, and Age > 67 years old.  Negative CHADS2 values include Prior Stroke/CVA/TIA.  The start date was 03/09/2006.  Anticoagulation responsible provider: Riley Kill MD, Maisie Fus.  INR POC: 2.3.  Exp: 04/2010.    Anticoagulation Management Assessment/Plan:      The patient's current anticoagulation dose is Coumadin 5 mg tabs: Use as directed by anticoagulation clinic..  The target INR is 2.0-3.0.  The next INR is due 03/28/2009.  Anticoagulation instructions were given to patient.  Results were reviewed/authorized by Shelby Dubin, PharmD, BCPS, CPP.  She was notified by Shelby Dubin PharmD, BCPS, CPP.         Prior Anticoagulation Instructions: INR 2.2 Continue 2.5mg s everyday except on Thursdays 0mg s. Recheck in 4 weeks.   Current Anticoagulation Instructions: INR 2.3  Resume 2.5 mg daily except hold Thursdays.  Recheck on  03/28/2009.

## 2010-04-25 NOTE — Medication Information (Signed)
Summary: Coumadin Clinic  Anticoagulant Therapy  Managed by: Cloyde Reams, RN, BSN Referring MD: Charlies Constable MD PCP: Pearson Grippe Supervising MD: Shirlee Latch MD, Concettina Leth Indication 1: Atrial Fibrillation (ICD-427.31) Lab Used: [facility] [place] Site: [address] INR POC 3.6 INR RANGE 2 - 3    Bleeding/hemorrhagic complications: no     Any changes in medication regimen? no     Any missed doses?: no         Allergies: 1)  ! Codeine  Anticoagulation Management History:      Her anticoagulation is being managed by telephone today.  Positive risk factors for bleeding include an age of 75 years or older.  Negative risk factors for bleeding include no history of CVA/TIA and no history of GI bleeding.  The bleeding index is 'intermediate risk'.  Positive CHADS2 values include History of CHF, History of HTN, and Age > 75 years old.  Negative CHADS2 values include Prior Stroke/CVA/TIA.  The start date was 03/09/2006.  Anticoagulation responsible provider: Shirlee Latch MD, Senya Hinzman.  INR POC: 3.6.  Exp: 04/2010.    Anticoagulation Management Assessment/Plan:      The patient's current anticoagulation dose is Coumadin 5 mg tabs: Use as directed by anticoagulation clinic., Coumadin 2.5 mg tabs: Take as directed by coumadin clinic..  The target INR is 2.0-3.0.  The next INR is due 04/30/2009.  Anticoagulation instructions were given to Ron, [facility] RN.  Results were reviewed/authorized by Cloyde Reams, RN, BSN.  She was notified by Cloyde Reams RN.         Prior Anticoagulation Instructions: INR 3.0 Change dose to 2.5mg s daily except 1.25mg s on Mondays and Fridays. Recheck in one week.  Orders given to [facility] while at home with pt.   Current Anticoagulation Instructions: INR 3.6  Spoke with Ron, [facility] while at pt's home.  Advised to hold x 1 dose then decr dosage to 2.5mg  daily except 1.25mg  on MWF.  Recheck in 1 week.

## 2010-04-25 NOTE — Assessment & Plan Note (Signed)
Summary: f74m   Visit Type:  Follow-up Primary Provider:  Pearson Grippe  CC:  no complaints.  History of Present Illness: catheter the patient is 77 years old and return for management of CAD and CHF. She had an anterior MI treated with a stent to the LAD and has had a persistently low ejection fraction of 25% since that time. She has had systolic CHF. She also has an LV thrombus and is on chronic Coumadin therapy for that. She has a backup VVI ICD and has had to have a lead revision because of lead dysfunction.  She says she's been doing fairly well. She had one episode where she developed some edema in her leg and she took a extra  Lasix. She's had no chest pain or palpitations. She feels her energy level is about the same as it was 3 months ago. She has not been gardening because she has some fear this may aggravate her problem.  Current Medications (verified): 1)  Carvedilol 25 Mg Tabs (Carvedilol) .Marland Kitchen.. 1 Tab Two Times A Day 2)  Vytorin 10-40 Mg Tabs (Ezetimibe-Simvastatin) .... Take One Tablet By Mouth Dailyat Bedtime 3)  Furosemide 40 Mg Tabs (Furosemide) .... Take One Tablet By Mouth Daily. 4)  Aspirin 81 Mg Tbec (Aspirin) .... Take One Tablet By Mouth Daily 5)  Glucosamine .... Daily 6)  Multi Vitamin .... Daily 7)  Vitamin D .... Daily 8)  Fish Oil .... Daily 9)  Potassium Chloride Cr 10 Meq Cr-Caps (Potassium Chloride) .... Take One Tablet By Mouth Daily 10)  Coumadin 5 Mg Tabs (Warfarin Sodium) .... Use As Directed By Anticoagulation Clinic. 11)  Enalapril Maleate 20 Mg Tabs (Enalapril Maleate) .... Take 1 Tablet By Mouth Twice A Day 12)  Spironolactone 25 Mg Tabs (Spironolactone) .... Take 1 Tablet By Mouth Once A Day 13)  Nitrostat 0.4 Mg Subl (Nitroglycerin) .Marland Kitchen.. 1 Tablet Under Tongue At Onset of Chest Pain; You May Repeat Every 5 Minutes For Up To 3 Doses. 14)  Zoloft Tabs (Sertraline Hcl) .... Take 1 By Mouth Once Daily 15)  Coumadin 2.5 Mg Tabs (Warfarin Sodium) .... Take As  Directed By Coumadin Clinic.  Allergies: 1)  ! Codeine  Past History:  Past Medical History: Reviewed history from 06/26/2008 and no changes required. Current Problems:  HYPERCHOLESTEROLEMIA (ICD-272.0) ARTHRITIS (ICD-716.90) VENTRICULAR ANEURYSM, LEFT (ICD-414.10) UNSPECIFIED SYSTOLIC HEART FAILURE (ICD-428.20) ISCHEMIC CARDIOMYOPATHY (ICD-414.8) CAD (ICD-414.00) HYPERLIPIDEMIA (ICD-272.4) HYPERTENSION (ICD-401.9) SHORTNESS OF BREATH (ICD-786.05) 1. Coronary artery disease status post anterior wall myocardial     infarction 2006 treated with a Cypher drug-eluting stent to left     anterior descending. 2. Ischemic cardiomyopathy, ejection fraction 20%. 3. Systolic heart failure, euvolemic class III. 4. Status post backup ventricular demand pacing implantable     cardioverter-defibrillator. 5. Hypertension. 6. Hyperlipidemia. 7. Left ventricular thrombus on Coumadin therapy.   Review of Systems       ROS is negative except as outlined in HPI.   Vital Signs:  Patient profile:   77 year old female Height:      60 inches Weight:      149 pounds BMI:     29.20 Pulse rate:   58 / minute Pulse rhythm:   regular Resp:     18 per minute BP sitting:   116 / 68  (left arm) Cuff size:   regular  Vitals Entered By: Laurance Flatten CMA (August 28, 2009 10:48 AM)  Physical Exam  Additional Exam:  Gen. Well-nourished, in no  distress   Neck: JVD questionably 1 cm above the clavicle, thyroid not enlarged, no carotid bruits Lungs: No tachypnea, clear without rales, rhonchi or wheezes Cardiovascular: Rhythm regular, PMI not displaced,  heart sounds  normal, no murmurs or gallops, no peripheral edema, pulses normal in all 4 extremities. Abdomen: BS normal, abdomen soft and non-tender without masses or organomegaly, no hepatosplenomegaly. MS: No deformities, no cyanosis or clubbing   Neuro:  No focal sns   Skin:  no lesions     ICD Specifications Following MD:  Sherryl Manges, MD      ICD Vendor:  [employer]     ICD Model Number:  7232     ICD Serial Number:  [id number] ICD DOI:  11/28/2004     ICD Implanting MD:  Lewayne Bunting, MD  Lead 1:    Location: RV     DOI: 11/28/2004     Model #: [id number]     Serial #: [id number]     Status: capped Lead 2:    Location: [facility]     DOI: 06/01/2007     Model #: [id number]     Serial #: [id number]     Status: active  Indications::  NSVT, LV DYSFUNCTION   ICD Follow Up ICD Dependent:  No      Episodes Coumadin:  Yes  Brady Parameters Mode VVI     Lower Rate Limit:  40      Tachy Zones VF:  200     VT:  250     VT1:  171     Impression & Recommendations:  Problem # 1:  CAD (ICD-414.00) She had a remote anterior MI treated with a drug-eluting stent to the LAD. She's had no recent chest pain this prompted stable. Her updated medication list for this problem includes:    Carvedilol 25 Mg Tabs (Carvedilol) .Marland Kitchen... 1 tab two times a day    Aspirin 81 Mg Tbec (Aspirin) .Marland Kitchen... Take one tablet by mouth daily    Coumadin 5 Mg Tabs (Warfarin sodium) ..... Use as directed by anticoagulation clinic.    Enalapril Maleate 20 Mg Tabs (Enalapril maleate) .Marland Kitchen... Take 1 tablet by mouth twice a day    Nitrostat 0.4 Mg Subl (Nitroglycerin) .Marland Kitchen... 1 tablet under tongue at onset of chest pain; you may repeat every 5 minutes for up to 3 doses.    Coumadin 2.5 Mg Tabs (Warfarin sodium) .Marland Kitchen... Take as directed by coumadin clinic.  Orders: TLB-BMP (Basic Metabolic Panel-BMET) (80048-METABOL)  Problem # 2:  UNSPECIFIED SYSTOLIC HEART FAILURE (ICD-428.20) She has systolic CHF. I think she has JVD today and her symptoms suggest she has class III. We will increase her Lasix from 40 mg to 60 mg daily and get a BMP today and a BMP in one week with her primary care physician. Her updated medication list for this problem includes:    Carvedilol 25 Mg Tabs (Carvedilol) .Marland Kitchen... 1 tab two times a day    Furosemide 40 Mg Tabs (Furosemide) .Marland Kitchen... Take one and a half tablets by mouth once  daily    Aspirin 81 Mg Tbec (Aspirin) .Marland Kitchen... Take one tablet by mouth daily    Coumadin 5 Mg Tabs (Warfarin sodium) ..... Use as directed by anticoagulation clinic.    Enalapril Maleate 20 Mg Tabs (Enalapril maleate) .Marland Kitchen... Take 1 tablet by mouth twice a day    Spironolactone 25 Mg Tabs (Spironolactone) .Marland Kitchen... Take 1 tablet by mouth once a day    Nitrostat 0.4 Mg  Subl (Nitroglycerin) .Marland Kitchen... 1 tablet under tongue at onset of chest pain; you may repeat every 5 minutes for up to 3 doses.    Coumadin 2.5 Mg Tabs (Warfarin sodium) .Marland Kitchen... Take as directed by coumadin clinic.  Problem # 3:  AUTOMATIC IMPLANTABLE CARDIAC DEFIBRILLATOR SITU (ICD-V45.02) She has a backup VVI ICD. She had to have a new lead because of the dysfunction. She asked about her longevity and we will check on that.  Patient Instructions: 1)  Your physician recommends that you schedule a follow-up appointment in: 3 months. 2)  Your physician recommends that you have lab work today: bmet (414.01) and a bmet in 1 week at your primary care. 3)  Your physician has recommended you make the following change in your medication: 1) increase lasix to 60mg  once daily  Prescriptions: FUROSEMIDE 40 MG TABS (FUROSEMIDE) Take one and a half tablets by mouth once daily  #45 x 6   Entered by:   Sherri Rad, RN, BSN   Authorized by:   Lenoria Farrier, MD, [facility]   Signed by:   Sherri Rad, RN, BSN on 08/28/2009   Method used:   Electronically to        [facility] [phone]* (retail)       [address] [place], [place]  [address]       Ph: [phone] or [phone]       Fax: [phone]   RxID:   [id number]

## 2010-04-25 NOTE — Medication Information (Signed)
Summary: Coumadin Clinic  Anticoagulant Therapy  Managed by: Bethena Midget, RN, BSN Referring MD: Charlies Constable MD PCP: Pearson Grippe Supervising MD: Shirlee Latch MD, Delante Karapetyan Indication 1: Atrial Fibrillation (ICD-427.31) Lab Used: [facility] [place] Site: [address] INR POC 2.3 INR RANGE 2 - 3  Dietary changes: no    Health status changes: no    Bleeding/hemorrhagic complications: no    Recent/future hospitalizations: no    Any changes in medication regimen? no    Recent/future dental: no  Any missed doses?: no       Is patient compliant with meds? yes       Allergies: 1)  ! Codeine  Anticoagulation Management History:      Her anticoagulation is being managed by telephone today.  Positive risk factors for bleeding include an age of 75 years or older.  Negative risk factors for bleeding include no history of CVA/TIA and no history of GI bleeding.  The bleeding index is 'intermediate risk'.  Positive CHADS2 values include History of CHF, History of HTN, and Age > 74 years old.  Negative CHADS2 values include Prior Stroke/CVA/TIA.  The start date was 03/09/2006.  Anticoagulation responsible provider: Shirlee Latch MD, Rusty Glodowski.  INR POC: 2.3.    Anticoagulation Management Assessment/Plan:      The patient's current anticoagulation dose is Coumadin 5 mg tabs: Use as directed by anticoagulation clinic., Coumadin 2.5 mg tabs: Take as directed by coumadin clinic..  The target INR is 2.0-3.0.  The next INR is due 05/22/2009.  Anticoagulation instructions were given to Ron, [facility] RN.  Results were reviewed/authorized by Bethena Midget, RN, BSN.  She was notified by Bethena Midget, RN, BSN.         Prior Anticoagulation Instructions: INR 2.1  Spoke with Ron, [facility] RN while at pt's home.  Advised to continue on same dosage 2.5mg  daily except 1.25mg  MWF.  Recheck in 1 week.    Current Anticoagulation Instructions: KXF8.3 Continue 2.5mg s daily except 1.25mg s MWF. Recheck on 05/22/09 with MD  appt.

## 2010-04-25 NOTE — Medication Information (Signed)
Summary: rov/ln  Anticoagulant Therapy  Managed by: Reina Fuse, PharmD Referring MD: Charlies Constable MD PCP: Pearson Grippe Supervising MD: Myrtis Ser MD, Tinnie Gens Indication 1: Atrial Fibrillation (ICD-427.31) Lab Used: [facility] [place] Site: [address] INR POC 2.4 INR RANGE 2 - 3  Dietary changes: no    Health status changes: no    Bleeding/hemorrhagic complications: no    Recent/future hospitalizations: no    Any changes in medication regimen? no    Recent/future dental: no  Any missed doses?: no       Is patient compliant with meds? yes       Current Medications (verified): 1)  Carvedilol 25 Mg Tabs (Carvedilol) .Marland Kitchen.. 1 Tab Two Times A Day 2)  Vytorin 10-40 Mg Tabs (Ezetimibe-Simvastatin) .... Take One Tablet By Mouth Dailyat Bedtime 3)  Furosemide 40 Mg Tabs (Furosemide) .... Take One and A Half Tablets By Mouth Once Daily 4)  Aspirin 81 Mg Tbec (Aspirin) .... Take One Tablet By Mouth Daily 5)  Glucosamine .... Daily 6)  Multi Vitamin .... Daily 7)  Vitamin D .... Daily 8)  Fish Oil .... Daily 9)  Potassium Chloride Cr 10 Meq Cr-Caps (Potassium Chloride) .... Take One Tablet By Mouth Daily 10)  Coumadin 5 Mg Tabs (Warfarin Sodium) .... Use As Directed By Anticoagulation Clinic. 11)  Enalapril Maleate 20 Mg Tabs (Enalapril Maleate) .... Take 1 Tablet By Mouth Twice A Day 12)  Spironolactone 25 Mg Tabs (Spironolactone) .... Take 1 Tablet By Mouth Once A Day 13)  Nitrostat 0.4 Mg Subl (Nitroglycerin) .Marland Kitchen.. 1 Tablet Under Tongue At Onset of Chest Pain; You May Repeat Every 5 Minutes For Up To 3 Doses. 14)  Zoloft Tabs (Sertraline Hcl) .... Take 1 By Mouth Once Daily 15)  Coumadin 2.5 Mg Tabs (Warfarin Sodium) .... Take As Directed By Coumadin Clinic.  Allergies (verified): 1)  ! Codeine  Anticoagulation Management History:      The patient is taking warfarin and comes in today for a routine follow up visit.  Positive risk factors for bleeding include an age of  74 years or older.  Negative risk factors for bleeding include no history of CVA/TIA and no history of GI bleeding.  The bleeding index is 'intermediate risk'.  Positive CHADS2 values include History of CHF, History of HTN, and Age > 69 years old.  Negative CHADS2 values include Prior Stroke/CVA/TIA.  The start date was 03/09/2006.  Anticoagulation responsible provider: Myrtis Ser MD, Tinnie Gens.  INR POC: 2.4.  Cuvette Lot#: [id number].  Exp: 11/2010.    Anticoagulation Management Assessment/Plan:      The patient's current anticoagulation dose is Coumadin 5 mg tabs: Use as directed by anticoagulation clinic., Coumadin 2.5 mg tabs: Take as directed by coumadin clinic..  The target INR is 2.0-3.0.  The next INR is due 11/28/2010.  Anticoagulation instructions were given to patient.  Results were reviewed/authorized by Reina Fuse, PharmD.  She was notified by Reina Fuse PharmD.         Prior Anticoagulation Instructions: INR 1.9  Change to 2.5mg  on Sunday, Tuesday, Wednesday, Thursday, and Saturday and 1.25mg  on [place]onda and Friday.      Current Anticoagulation Instructions: INR 2.4  Continue taking Coumadin 0.5 tab (2.5 mg) on Sun, Tues, Wed, Thur, Sat and Coumadin 0.5 tab (1.25 mg) on Mon, Fri. Return to clinic on 9/6 to coincide with appointment with Dr. Juanda Chance.

## 2010-04-25 NOTE — Miscellaneous (Signed)
Summary: [facility]  [facility] Orders   Imported By: Roderic Ovens 04/24/2009 16:00:27  _____________________________________________________________________  External Attachment:    Type:   Image     Comment:   External Document

## 2010-04-25 NOTE — Miscellaneous (Signed)
Summary: [facility]  [facility]   Imported By: Roderic Ovens 04/24/2009 15:52:52  _____________________________________________________________________  External Attachment:    Type:   Image     Comment:   External Document

## 2010-04-25 NOTE — Letter (Signed)
Summary: Handout Printed  Printed Handout:  - Coumadin Instructions-w/out Meds 

## 2010-04-25 NOTE — Medication Information (Signed)
Summary: rov/jm  Anticoagulant Therapy  Managed by: Bethena Midget, RN, BSN Referring MD: Charlies Constable MD PCP: Pearson Grippe Supervising MD: Shirlee Latch MD, [place] Indication 1: Atrial Fibrillation (ICD-427.31) Lab Used: [facility] [place] Site: [address] INR POC 2.3 INR RANGE 2 - 3  Dietary changes: no    Health status changes: yes       Details: Had cyst in groin area,treated with Amoxicilliln  Bleeding/hemorrhagic complications: no    Recent/future hospitalizations: no    Any changes in medication regimen? yes       Details: Took Amoxicillin for 14 days.   Recent/future dental: no  Any missed doses?: no       Is patient compliant with meds? yes       Allergies: 1)  ! Codeine  Anticoagulation Management History:      The patient is taking warfarin and comes in today for a routine follow up visit.  Positive risk factors for bleeding include an age of 75 years or older.  Negative risk factors for bleeding include no history of CVA/TIA and no history of GI bleeding.  The bleeding index is 'intermediate risk'.  Positive CHADS2 values include History of CHF, History of HTN, and Age > 19 years old.  Negative CHADS2 values include Prior Stroke/CVA/TIA.  The start date was 03/09/2006.  Anticoagulation responsible provider: Shirlee Latch MD, [place].  INR POC: 2.3.  Cuvette Lot#: [id number].  Exp: 12/2010.    Anticoagulation Management Assessment/Plan:      The patient's current anticoagulation dose is Coumadin 5 mg tabs: Use as directed by anticoagulation clinic..  The target INR is 2.0-3.0.  The next INR is due 01/22/2010.  Anticoagulation instructions were given to patient.  Results were reviewed/authorized by Bethena Midget, RN, BSN.  She was notified by Bethena Midget, RN, BSN.         Prior Anticoagulation Instructions: INR 2.5   Continue taking one-half of the 5 mg tablet every day except for one-half of the 2.5 mg tablet on Monday an Friday.  We will see you in four weeks.    Current Anticoagulation Instructions: INR 2.3 Continue 2.5mg s daily except 1.25mg s on Mondays and  Fridays. Recheck in 4 weeks.

## 2010-04-25 NOTE — Cardiovascular Report (Signed)
Summary: Office Visit Remote   Office Visit Remote   Imported By: Roderic Ovens 11/14/2009 14:58:18  _____________________________________________________________________  External Attachment:    Type:   Image     Comment:   External Document

## 2010-04-25 NOTE — Miscellaneous (Signed)
Summary: Home Care Report Orders  Home Care Report Orders   Imported By: Kassie Mends 04/16/2009 09:09:55  _____________________________________________________________________  External Attachment:    Type:   Image     Comment:   External Document

## 2010-04-25 NOTE — Medication Information (Signed)
Summary: rov/ewj  Anticoagulant Therapy  Managed by: Weston Brass, PharmD Referring MD: Charlies Constable MD PCP: Pearson Grippe Supervising MD: Eden Emms MD, Theron Arista Indication 1: Atrial Fibrillation (ICD-427.31) Lab Used: [facility] [place] Site: [address] INR POC 2.1 INR RANGE 2 - 3  Dietary changes: no    Health status changes: no    Bleeding/hemorrhagic complications: no    Recent/future hospitalizations: no    Any changes in medication regimen? no    Recent/future dental: no  Any missed doses?: no       Is patient compliant with meds? yes       Allergies: 1)  ! Codeine  Anticoagulation Management History:      The patient is taking warfarin and comes in today for a routine follow up visit.  Positive risk factors for bleeding include an age of 60 years or older.  Negative risk factors for bleeding include no history of CVA/TIA and no history of GI bleeding.  The bleeding index is 'intermediate risk'.  Positive CHADS2 values include History of CHF, History of HTN, and Age > 46 years old.  Negative CHADS2 values include Prior Stroke/CVA/TIA.  The start date was 03/09/2006.  Anticoagulation responsible Keval Nam: Eden Emms MD, Theron Arista.  INR POC: 2.1.  Exp: 01/2011.    Anticoagulation Management Assessment/Plan:      The patient's current anticoagulation dose is Coumadin 5 mg tabs: Use as directed by anticoagulation clinic..  The target INR is 2.0-3.0.  The next INR is due 04/02/2010.  Anticoagulation instructions were given to patient.  Results were reviewed/authorized by Weston Brass, PharmD.  She was notified by Weston Brass PharmD.         Prior Anticoagulation Instructions: INR 2.7  Continue on same dosage 2.5mg  daily except 1.25mg  on Mondays and Fridays.  Recheck in 4 weeks.   Current Anticoagulation Instructions: INR 2.1  Continue same dose of 2.5mg  daily except 1.25mg  on Monday and Friday.  Recheck INR in 4 weeks.

## 2010-04-25 NOTE — Medication Information (Signed)
Summary: [facility]in Clinic  Anticoagulant Therapy  Managed by: Shelby Dubin, PharmD, BCPS, CPP Referring MD: Charlies Constable MD PCP: Pearson Grippe Supervising MD: Riley Kill MD, Maisie Fus Indication 1: Atrial Fibrillation (ICD-427.31) Lab Used: [facility] [place] Site: [address] INR POC 1.5 INR RANGE 2 - 3  Dietary changes: no    Health status changes: no    Bleeding/hemorrhagic complications: no    Recent/future hospitalizations: no    Any changes in medication regimen? no    Recent/future dental: no  Any missed doses?: no       Is patient compliant with meds? yes      Comments: called by Ron..  Current Medications (verified): 1)  Carvedilol 25 Mg Tabs (Carvedilol) .Marland Kitchen.. 1 Tab Two Times A Day 2)  Vytorin 10-40 Mg Tabs (Ezetimibe-Simvastatin) .... Take One Tablet By Mouth Dailyat Bedtime 3)  Furosemide 40 Mg Tabs (Furosemide) .... Take One Tablet By Mouth Daily. 4)  Aspirin 81 Mg Tbec (Aspirin) .... Take One Tablet By Mouth Daily 5)  Glucosamine .... Daily 6)  Multi Vitamin .... Daily 7)  Vitamin D .... Daily 8)  Fish Oil .... Daily 9)  Potassium Chloride Cr 10 Meq Cr-Caps (Potassium Chloride) .... Take One Tablet By Mouth Daily 10)  Coumadin 5 Mg Tabs (Warfarin Sodium) .... Use As Directed By Anticoagulation Clinic. 11)  Enalapril Maleate 20 Mg Tabs (Enalapril Maleate) .... Take 1 Tablet By Mouth Twice A Day 12)  Spironolactone 25 Mg Tabs (Spironolactone) .... Take 1 Tablet By Mouth Once A Day 13)  Nitrostat 0.4 Mg Subl (Nitroglycerin) .Marland Kitchen.. 1 Tablet Under Tongue At Onset of Chest Pain; You May Repeat Every 5 Minutes For Up To 3 Doses.  Allergies (verified): 1)  ! Codeine  Anticoagulation Management History:      The patient is taking warfarin and comes in today for a routine follow up visit.  Positive risk factors for bleeding include an age of 75 years or older.  Negative risk factors for bleeding include no history of CVA/TIA and no history of GI bleeding.  The  bleeding index is 'intermediate risk'.  Positive CHADS2 values include History of CHF, History of HTN, and Age > 75 years old.  Negative CHADS2 values include Prior Stroke/CVA/TIA.  The start date was 03/09/2006.  Anticoagulation responsible provider: Riley Kill MD, Maisie Fus.  INR POC: 1.5.  Exp: 04/2010.    Anticoagulation Management Assessment/Plan:      The patient's current anticoagulation dose is Coumadin 5 mg tabs: Use as directed by anticoagulation clinic..  The target INR is 2.0-3.0.  The next INR is due 04/02/2009.  Anticoagulation instructions were given to patient.  Results were reviewed/authorized by Shelby Dubin, PharmD, BCPS, CPP.  She was notified by Shelby Dubin PharmD, BCPS, CPP.         Prior Anticoagulation Instructions: INR 2.3  Resume 2.5 mg daily except hold Thursdays.  Recheck on 03/28/2009.  Current Anticoagulation Instructions: INR 1.5  Change to 2.5 mg daily.    Orders to Ron, RN at [facility] at 1400 on 03/28/2009.  The patient is to hold four doses of coumadin.  The dosage to be resumed includes:

## 2010-04-25 NOTE — Assessment & Plan Note (Signed)
Summary: PER CHECK OUT   Visit Type:  Follow-up Primary Provider:  Pearson Grippe  CC:  no cardiac complaints.  History of Present Illness: The patient is 77 years old and return for management of CAD and CHF. She had an anterior wall MI treated with a stent to the LAD several years ago and has had residual left ventricular dysfunction with an ejection fraction of 25%. She has had systolic congestive heart failure but not much volume overload. She also has an LV thrombus which has been treated with Coumadin. She has a backup ICD followed by Dr. Ladona Ridgel.  In December she was hospitalized with pneumonia, dehydration, and renal insufficiency. She recovered from this. She is now doing well has had no chest pain or palpitations and no undue shortness of breath.  Her other problems include hypertension and hyperlipidemia.  Current Medications (verified): 1)  Carvedilol 25 Mg Tabs (Carvedilol) .Marland Kitchen.. 1 Tab Two Times A Day 2)  Vytorin 10-40 Mg Tabs (Ezetimibe-Simvastatin) .... Take One Tablet By Mouth Dailyat Bedtime 3)  Furosemide 40 Mg Tabs (Furosemide) .... Take One Tablet By Mouth Daily. 4)  Aspirin 81 Mg Tbec (Aspirin) .... Take One Tablet By Mouth Daily 5)  Glucosamine .... Daily 6)  Multi Vitamin .... Daily 7)  Vitamin D .... Daily 8)  Fish Oil .... Daily 9)  Potassium Chloride Cr 10 Meq Cr-Caps (Potassium Chloride) .... Take One Tablet By Mouth Daily 10)  Coumadin 5 Mg Tabs (Warfarin Sodium) .... Use As Directed By Anticoagulation Clinic. 11)  Enalapril Maleate 20 Mg Tabs (Enalapril Maleate) .... Take 1 Tablet By Mouth Twice A Day 12)  Spironolactone 25 Mg Tabs (Spironolactone) .... Take 1 Tablet By Mouth Once A Day 13)  Nitrostat 0.4 Mg Subl (Nitroglycerin) .Marland Kitchen.. 1 Tablet Under Tongue At Onset of Chest Pain; You May Repeat Every 5 Minutes For Up To 3 Doses. 14)  Zoloft Tabs (Sertraline Hcl) .... Take 1 By Mouth Once Daily  Allergies (verified): 1)  ! Codeine  Past History:  Past Medical  History: Reviewed history from 06/26/2008 and no changes required. Current Problems:  HYPERCHOLESTEROLEMIA (ICD-272.0) ARTHRITIS (ICD-716.90) VENTRICULAR ANEURYSM, LEFT (ICD-414.10) UNSPECIFIED SYSTOLIC HEART FAILURE (ICD-428.20) ISCHEMIC CARDIOMYOPATHY (ICD-414.8) CAD (ICD-414.00) HYPERLIPIDEMIA (ICD-272.4) HYPERTENSION (ICD-401.9) SHORTNESS OF BREATH (ICD-786.05) 1. Coronary artery disease status post anterior wall myocardial     infarction 2006 treated with a Cypher drug-eluting stent to left     anterior descending. 2. Ischemic cardiomyopathy, ejection fraction 20%. 3. Systolic heart failure, euvolemic class III. 4. Status post backup ventricular demand pacing implantable     cardioverter-defibrillator. 5. Hypertension. 6. Hyperlipidemia. 7. Left ventricular thrombus on Coumadin therapy.   Review of Systems       ROS is negative except as outlined in HPI.   Vital Signs:  Patient profile:   77 year old female Height:      60 inches Weight:      147 pounds BMI:     28.81 Pulse rate:   60 / minute BP sitting:   135 / 76  (left arm) Cuff size:   regular  Vitals Entered By: Burnett Kanaris, CNA (May 22, 2009 10:02 AM)  Physical Exam  Additional Exam:  Gen. Well-nourished, in no distress   Neck: No JVD, thyroid not enlarged, no carotid bruits Lungs: No tachypnea, clear without rales, rhonchi or wheezes Cardiovascular: Rhythm regular, PMI not displaced,  heart sounds  normal, no murmurs or gallops, no peripheral edema, pulses normal in all 4 extremities. Abdomen: BS  normal, abdomen soft and non-tender without masses or organomegaly, no hepatosplenomegaly. MS: No deformities, no cyanosis or clubbing   Neuro:  No focal sns   Skin:  no lesions     ICD Specifications Following MD:  Sherryl Manges, MD     ICD Vendor:  [employer]     ICD Model Number:  7232     ICD Serial Number:  [id number] ICD DOI:  11/28/2004     ICD Implanting MD:  Lewayne Bunting, MD  Lead 1:     Location: [facility]     DOI: 11/28/2004     Model #: [id number]     Serial #: [id number]     Status: capped Lead 2:    Location: [facility]     DOI: 06/01/2007     Model #: [id number]     Serial #: [id number]     Status: active  Indications::  NSVT, LV DYSFUNCTION   ICD Follow Up ICD Dependent:  No      Episodes Coumadin:  Yes  Brady Parameters Mode VVI     Lower Rate Limit:  40      Tachy Zones VF:  200     VT:  250     VT1:  171     Impression & Recommendations:  Problem # 1:  CAD (ICD-414.00) She has CAD with a previous anterior MI and a stent to the LAD. She has had no recent chest pain and this problem appears stable.   The following medications were removed from the medication list:    Coumadin 2.5 Mg Tabs (Warfarin sodium) .Marland Kitchen... Take as directed by coumadin clinic. Her updated medication list for this problem includes:    Carvedilol 25 Mg Tabs (Carvedilol) .Marland Kitchen... 1 tab two times a day    Aspirin 81 Mg Tbec (Aspirin) .Marland Kitchen... Take one tablet by mouth daily    Coumadin 5 Mg Tabs (Warfarin sodium) ..... Use as directed by anticoagulation clinic.    Enalapril Maleate 20 Mg Tabs (Enalapril maleate) .Marland Kitchen... Take 1 tablet by mouth twice a day    Nitrostat 0.4 Mg Subl (Nitroglycerin) .Marland Kitchen... 1 tablet under tongue at onset of chest pain; you may repeat every 5 minutes for up to 3 doses.  Orders: EKG w/ Interpretation (93000) TLB-BMP (Basic Metabolic Panel-BMET) (80048-METABOL) TLB-CBC Platelet - w/Differential (85025-CBCD)  Problem # 2:  UNSPECIFIED SYSTOLIC HEART FAILURE (ICD-428.20) She has a history of systolic heart failure with an ejection fraction of 25%. She also has an LV thrombus and is on chronic Coumadin therapy for this. She appears euvolemic and compensated today and we will continue current therapy.  The following medications were removed from the medication list:    Coumadin 2.5 Mg Tabs (Warfarin sodium) .Marland Kitchen... Take as directed by coumadin clinic. Her updated medication list for this problem  includes:    Carvedilol 25 Mg Tabs (Carvedilol) .Marland Kitchen... 1 tab two times a day    Furosemide 40 Mg Tabs (Furosemide) .Marland Kitchen... Take one tablet by mouth daily.    Aspirin 81 Mg Tbec (Aspirin) .Marland Kitchen... Take one tablet by mouth daily    Coumadin 5 Mg Tabs (Warfarin sodium) ..... Use as directed by anticoagulation clinic.    Enalapril Maleate 20 Mg Tabs (Enalapril maleate) .Marland Kitchen... Take 1 tablet by mouth twice a day    Spironolactone 25 Mg Tabs (Spironolactone) .Marland Kitchen... Take 1 tablet by mouth once a day    Nitrostat 0.4 Mg Subl (Nitroglycerin) .Marland Kitchen... 1 tablet under tongue at onset of chest pain; you  may repeat every 5 minutes for up to 3 doses.  Orders: TLB-BMP (Basic Metabolic Panel-BMET) (80048-METABOL) TLB-CBC Platelet - w/Differential (85025-CBCD)  Problem # 3:  HYPERCHOLESTEROLEMIA (ICD-272.0) This is currently managed with nitroglycerin. Her updated medication list for this problem includes:    Vytorin 10-40 Mg Tabs (Ezetimibe-simvastatin) .Marland Kitchen... Take one tablet by mouth dailyat bedtime  Patient Instructions: 1)  Your physician recommends that you schedule a follow-up appointment in: 3 months. 2)  Your physician recommends that you have lab work today: bmet/cbc (428.22;414.01)

## 2010-04-25 NOTE — Medication Information (Signed)
Summary: rov/sp  Anticoagulant Therapy  Managed by: Weston Brass, PharmD Referring MD: Charlies Constable MD PCP: Pearson Grippe Supervising MD: Myrtis Ser MD, Tinnie Gens Indication 1: Atrial Fibrillation (ICD-427.31) Lab Used: [facility] [place] Site: [address] INR POC 2.2 INR RANGE 2 - 3  Dietary changes: no    Health status changes: no    Bleeding/hemorrhagic complications: no    Recent/future hospitalizations: no    Any changes in medication regimen? no    Recent/future dental: no  Any missed doses?: no       Is patient compliant with meds? yes       Allergies: 1)  ! Codeine  Anticoagulation Management History:      The patient is taking warfarin and comes in today for a routine follow up visit.  Positive risk factors for bleeding include an age of 51 years or older.  Negative risk factors for bleeding include no history of CVA/TIA and no history of GI bleeding.  The bleeding index is 'intermediate risk'.  Positive CHADS2 values include History of CHF, History of HTN, and Age > 29 years old.  Negative CHADS2 values include Prior Stroke/CVA/TIA.  The start date was 03/09/2006.  Anticoagulation responsible provider: Myrtis Ser MD, Tinnie Gens.  INR POC: 2.2.  Cuvette Lot#: [id number].  Exp: 11/2010.    Anticoagulation Management Assessment/Plan:      The patient's current anticoagulation dose is Coumadin 5 mg tabs: Use as directed by anticoagulation clinic., Coumadin 2.5 mg tabs: Take as directed by coumadin clinic..  The target INR is 2.0-3.0.  The next INR is due 10/16/2009.  Anticoagulation instructions were given to patient.  Results were reviewed/authorized by Weston Brass, PharmD.  She was notified by Weston Brass PharmD.         Prior Anticoagulation Instructions: INR 1.9  Take 3.75mg  (1/2 of peach and 1/2 of green tablet) today then resume same dose of 2.5mg  daily except 1.25mg  on Monday, Wednesday and Friday   Current Anticoagulation Instructions: INR 2.2  Continue same dose  of 2.5mg  every day except 1.25mg  on Monday, Wednesday and Friday.

## 2010-04-25 NOTE — Assessment & Plan Note (Signed)
Summary: f49m   Visit Type:  Follow-up Primary Provider:  Pearson Grippe  CC:  no complaints.  History of Present Illness: The patient is 77 years old returned for evaluation of CAD and CHF. She had a remote anterior wall MI treated with a stent to the LAD. She has an ejection fraction of 25% and a history of systolic CHF. She also has an LV thrombus which is treated with Coumadin. She is a backup VVI ICD.  When I saw her last time increased her Lasix to 60 mg daily.  She feels she's been doing well. She's had no chest pain. She has occasional fluttering in her chest.  Current Medications (verified): 1)  Carvedilol 25 Mg Tabs (Carvedilol) .Marland Kitchen.. 1 Tab Two Times A Day 2)  Vytorin 10-40 Mg Tabs (Ezetimibe-Simvastatin) .... Take One Tablet By Mouth Dailyat Bedtime 3)  Furosemide 40 Mg Tabs (Furosemide) .... Take One and A Half Tablets By Mouth Once Daily 4)  Aspirin 81 Mg Tbec (Aspirin) .... Take One Tablet By Mouth Daily 5)  Glucosamine .... Daily 6)  Multi Vitamin .... Daily 7)  Vitamin D .... Daily 8)  Fish Oil .... Daily 9)  Potassium Chloride Cr 10 Meq Cr-Caps (Potassium Chloride) .... Take One Tablet By Mouth Daily 10)  Coumadin 5 Mg Tabs (Warfarin Sodium) .... Use As Directed By Anticoagulation Clinic. 11)  Enalapril Maleate 20 Mg Tabs (Enalapril Maleate) .... Take 1 Tablet By Mouth Twice A Day 12)  Spironolactone 25 Mg Tabs (Spironolactone) .... Take 1 Tablet By Mouth Once A Day 13)  Nitrostat 0.4 Mg Subl (Nitroglycerin) .Marland Kitchen.. 1 Tablet Under Tongue At Onset of Chest Pain; You May Repeat Every 5 Minutes For Up To 3 Doses. 14)  Zoloft Tabs (Sertraline Hcl) .... Take 1 By Mouth Once Daily  Allergies (verified): 1)  ! Codeine  Past History:  Past Medical History: Reviewed history from 06/26/2008 and no changes required. Current Problems:  HYPERCHOLESTEROLEMIA (ICD-272.0) ARTHRITIS (ICD-716.90) VENTRICULAR ANEURYSM, LEFT (ICD-414.10) UNSPECIFIED SYSTOLIC HEART FAILURE  (ICD-428.20) ISCHEMIC CARDIOMYOPATHY (ICD-414.8) CAD (ICD-414.00) HYPERLIPIDEMIA (ICD-272.4) HYPERTENSION (ICD-401.9) SHORTNESS OF BREATH (ICD-786.05) 1. Coronary artery disease status post anterior wall myocardial     infarction 2006 treated with a Cypher drug-eluting stent to left     anterior descending. 2. Ischemic cardiomyopathy, ejection fraction 20%. 3. Systolic heart failure, euvolemic class III. 4. Status post backup ventricular demand pacing implantable     cardioverter-defibrillator. 5. Hypertension. 6. Hyperlipidemia. 7. Left ventricular thrombus on Coumadin therapy.   Review of Systems       ROS is negative except as outlined in HPI.   Vital Signs:  Patient profile:   77 year old female Height:      60 inches Weight:      150 pounds BMI:     29.40 Pulse rate:   59 / minute BP sitting:   128 / 70  (left arm) Cuff size:   regular  Vitals Entered By: Burnett Kanaris, CNA (November 27, 2009 12:58 PM)  Physical Exam  Additional Exam:  Gen. Well-nourished, in no distress   Neck: No JVD, thyroid not enlarged, no carotid bruits Lungs: No tachypnea, clear without rales, rhonchi or wheezes Cardiovascular: Rhythm regular, PMI not displaced,  heart sounds  normal, no murmurs or gallops, no peripheral edema, pulses normal in all 4 extremities. Abdomen: BS normal, abdomen soft and non-tender without masses or organomegaly, no hepatosplenomegaly. MS: No deformities, no cyanosis or clubbing   Neuro:  No focal sns  Skin:  no lesions     ICD Specifications Following MD:  Sherryl Manges, MD     ICD Vendor:  [employer]     ICD Model Number:  7232     ICD Serial Number:  [id number] ICD DOI:  11/28/2004     ICD Implanting MD:  Lewayne Bunting, MD  Lead 1:    Location: [facility]     DOI: 11/28/2004     Model #: 0865     Serial #: [id number]     Status: capped Lead 2:    Location: [facility]     DOI: 06/01/2007     Model #: [id number]     Serial #: [id number]     Status: active  Indications::  NSVT, LV  DYSFUNCTION   ICD Follow Up ICD Dependent:  No      Episodes Coumadin:  Yes  Brady Parameters Mode VVI     Lower Rate Limit:  40      Tachy Zones VF:  200     VT:  250     VT1:  171     Impression & Recommendations:  Problem # 1:  UNSPECIFIED SYSTOLIC HEART FAILURE (ICD-428.20) She has an ejection fraction of 25% two times a day she is euvolemic today. We will continue current therapy. We'll get a BMP today.  She is having a cough which is dry and which I think may be related to enalapril. We will switch her from enalapril to Cozaar 25 mg b.i.d. The following medications were removed from the medication list:    Coumadin 2.5 Mg Tabs (Warfarin sodium) .Marland Kitchen... Take as directed by coumadin clinic. Her updated medication list for this problem includes:    Carvedilol 25 Mg Tabs (Carvedilol) .Marland Kitchen... 1 tab two times a day    Furosemide 40 Mg Tabs (Furosemide) .Marland Kitchen... Take one and a half tablets by mouth once daily    Aspirin 81 Mg Tbec (Aspirin) .Marland Kitchen... Take one tablet by mouth daily    Coumadin 5 Mg Tabs (Warfarin sodium) ..... Use as directed by anticoagulation clinic.    Cozaar 25 Mg Tabs (Losartan potassium) ..... One twice a day    Spironolactone 25 Mg Tabs (Spironolactone) .Marland Kitchen... Take 1 tablet by mouth once a day    Nitrostat 0.4 Mg Subl (Nitroglycerin) .Marland Kitchen... 1 tablet under tongue at onset of chest pain; you may repeat every 5 minutes for up to 3 doses.  Orders: EKG w/ Interpretation (93000) TLB-BMP (Basic Metabolic Panel-BMET) (80048-METABOL)  Problem # 2:  CAD (ICD-414.00) She had an anterior MI treated with PTCA and stenting of the LAD. She's had no recent chest pain this problem appears stable. The following medications were removed from the medication list:    Coumadin 2.5 Mg Tabs (Warfarin sodium) .Marland Kitchen... Take as directed by coumadin clinic. Her updated medication list for this problem includes:    Carvedilol 25 Mg Tabs (Carvedilol) .Marland Kitchen... 1 tab two times a day    Aspirin 81 Mg Tbec  (Aspirin) .Marland Kitchen... Take one tablet by mouth daily    Coumadin 5 Mg Tabs (Warfarin sodium) ..... Use as directed by anticoagulation clinic.    Nitrostat 0.4 Mg Subl (Nitroglycerin) .Marland Kitchen... 1 tablet under tongue at onset of chest pain; you may repeat every 5 minutes for up to 3 doses.  Orders: TLB-BMP (Basic Metabolic Panel-BMET) (80048-METABOL)  Problem # 3:  AUTOMATIC IMPLANTABLE CARDIAC DEFIBRILLATOR SITU (ICD-V45.02) She has a backup VVI ICD. She is calling up for regular phone checks.  Patient  Instructions: 1)  Your physician has recommended you make the following change in your medication:  2)  Stop Enalapril 3)  Start Cozaar 25mg  twice a day. 4)  Your physician recommends that you have lab today---BMP 428.22 414.01 402.10 5)  Your physician recommends that you schedule a follow-up appointment in: December with Dr Juanda Chance. Prescriptions: COZAAR 25 MG TABS (LOSARTAN POTASSIUM) one twice a day  #60 x 6   Entered by:   Katina Dung, RN, BSN   Authorized by:   Lenoria Farrier, MD, [facility]   Signed by:   Katina Dung, RN, BSN on 11/27/2009   Method used:   Electronically to        [facility] [phone]* (retail)       [address] [place], [place]  [address]       Ph: [phone] or [phone]       Fax: [phone]   RxID:   [id number]

## 2010-04-30 ENCOUNTER — Encounter (INDEPENDENT_AMBULATORY_CARE_PROVIDER_SITE_OTHER): Payer: MEDICARE

## 2010-04-30 ENCOUNTER — Encounter: Payer: Self-pay | Admitting: Cardiovascular Disease

## 2010-04-30 DIAGNOSIS — I4891 Unspecified atrial fibrillation: Secondary | ICD-10-CM

## 2010-04-30 DIAGNOSIS — Z7901 Long term (current) use of anticoagulants: Secondary | ICD-10-CM

## 2010-05-09 NOTE — Medication Information (Signed)
Summary: Coumadin Clinic  Anticoagulant Therapy  Managed by: Bethena Midget, RN, BSN Referring MD: Charlies Constable MD PCP: Pearson Grippe Supervising MD: Eden Emms MD, Theron Arista Indication 1: Atrial Fibrillation (ICD-427.31) Lab Used: [facility] [place] Site: [address] INR POC 2.6 INR RANGE 2 - 3  Dietary changes: no    Health status changes: no    Bleeding/hemorrhagic complications: no    Recent/future hospitalizations: no    Any changes in medication regimen? yes       Details: Macrobid started last week for UTI  Recent/future dental: no  Any missed doses?: no       Is patient compliant with meds? yes       Allergies: 1)  ! Codeine  Anticoagulation Management History:      The patient is taking warfarin and comes in today for a routine follow up visit.  Positive risk factors for bleeding include an age of 75 years or older.  Negative risk factors for bleeding include no history of CVA/TIA and no history of GI bleeding.  The bleeding index is 'intermediate risk'.  Positive CHADS2 values include History of CHF, History of HTN, and Age > 46 years old.  Negative CHADS2 values include Prior Stroke/CVA/TIA.  The start date was 03/09/2006.  Anticoagulation responsible provider: Eden Emms MD, Theron Arista.  INR POC: 2.6.  Cuvette Lot#: [id number].  Exp: 04/2011.    Anticoagulation Management Assessment/Plan:      The patient's current anticoagulation dose is Coumadin 5 mg tabs: Use as directed by anticoagulation clinic..  The target INR is 2.0-3.0.  The next INR is due 05/28/2010.  Anticoagulation instructions were given to patient.  Results were reviewed/authorized by Bethena Midget, RN, BSN.  She was notified by Bethena Midget, RN, BSN.         Prior Anticoagulation Instructions: INR 2.6  Continue on same dosage 2.5mg  daily except 1.25mg  on Mondays and Fridays.  Recheck in 4 weeks.    Current Anticoagulation Instructions: INR 2.6  Continue 1 pill everyday except 1/2 pill on Mondays and  Fridays. Recheck in 4 weeks.

## 2010-05-14 ENCOUNTER — Encounter (INDEPENDENT_AMBULATORY_CARE_PROVIDER_SITE_OTHER): Payer: Medicare Other | Admitting: Internal Medicine

## 2010-05-14 ENCOUNTER — Encounter: Payer: Self-pay | Admitting: Internal Medicine

## 2010-05-14 DIAGNOSIS — I5022 Chronic systolic (congestive) heart failure: Secondary | ICD-10-CM

## 2010-05-14 DIAGNOSIS — I2589 Other forms of chronic ischemic heart disease: Secondary | ICD-10-CM

## 2010-05-21 NOTE — Assessment & Plan Note (Signed)
Summary: medtronic check   Primary Provider:  Pearson Grippe   History of Present Illness:       The patient is 77 years old returned for evaluation of CAD and CHF. She had a remote anterior wall MI treated with a stent to the LAD. She has an ejection fraction of 25% and a history of systolic CHF. She also has an LV thrombus which is treated with Coumadin. She is a backup VVI ICD.  She feels she's been doing well. She's had no chest pain. She has occasional fluttering in her chest. No intercurrent ICD therapies.  Current Medications (verified): 1)  Carvedilol 25 Mg Tabs (Carvedilol) .Marland Kitchen.. 1 Tab Two Times A Day 2)  Vytorin 10-40 Mg Tabs (Ezetimibe-Simvastatin) .... Take One Tablet By Mouth Dailyat Bedtime 3)  Furosemide 40 Mg Tabs (Furosemide) .... Take One and A Half Tablets By Mouth Once Daily 4)  Aspirin 81 Mg Tbec (Aspirin) .... Take One Tablet By Mouth Daily 5)  Glucosamine 500 Mg Caps (Glucosamine Sulfate) .... Take 1 Capsule By Mouth Once A Day 6)  Multivitamins  Tabs (Multiple Vitamin) .... Take 1 Tablet By Mouth Once A Day 7)  Vitamin D 1000 Unit Tabs (Cholecalciferol) .... Take 1 Tablet By Mouth Once A Day 8)  Fish Oil 1000 Mg Caps (Omega-3 Fatty Acids) .... Take 1 Capsule By Mouth Once A Day 9)  Potassium Chloride Cr 10 Meq Cr-Caps (Potassium Chloride) .... Take One Tablet By Mouth Daily 10)  Coumadin 5 Mg Tabs (Warfarin Sodium) .... Use As Directed By Anticoagulation Clinic. 11)  Cozaar 25 Mg Tabs (Losartan Potassium) .... One Twice A Day 12)  Spironolactone 25 Mg Tabs (Spironolactone) .... Take 1 Tablet By Mouth Once A Day 13)  Nitrostat 0.4 Mg Subl (Nitroglycerin) .Marland Kitchen.. 1 Tablet Under Tongue At Onset of Chest Pain; You May Repeat Every 5 Minutes For Up To 3 Doses. 14)  Zoloft 50 Mg Tabs (Sertraline Hcl) .... Take 1 Tablet By Mouth Once A Day  Allergies: 1)  ! Codeine 2)  ! Williams Che  Past History:  Past Medical History: Last updated: 06/26/2008 Current Problems:    HYPERCHOLESTEROLEMIA (ICD-272.0) ARTHRITIS (ICD-716.90) VENTRICULAR ANEURYSM, LEFT (ICD-414.10) UNSPECIFIED SYSTOLIC HEART FAILURE (ICD-428.20) ISCHEMIC CARDIOMYOPATHY (ICD-414.8) CAD (ICD-414.00) HYPERLIPIDEMIA (ICD-272.4) HYPERTENSION (ICD-401.9) SHORTNESS OF BREATH (ICD-786.05) 1. Coronary artery disease status post anterior wall myocardial     infarction 2006 treated with a Cypher drug-eluting stent to left     anterior descending. 2. Ischemic cardiomyopathy, ejection fraction 20%. 3. Systolic heart failure, euvolemic class III. 4. Status post backup ventricular demand pacing implantable     cardioverter-defibrillator. 5. Hypertension. 6. Hyperlipidemia. 7. Left ventricular thrombus on Coumadin therapy.   Past Surgical History: Last updated: 06/19/2008 AICD implantation cardiac stent coronary angioplasty cardiac catheterization   Review of Systems  The patient denies chest pain, syncope, dyspnea on exertion, and peripheral edema.    Vital Signs:  Patient profile:   77 year old female Height:      60 inches Weight:      154 pounds Pulse rate:   71 / minute BP sitting:   120 / 62  Vitals Entered By: Laurance Flatten CMA (May 14, 2010 4:20 PM)  Physical Exam  General:  Elderly, well developed, well nourished, in no acute distress.  HEENT: normal Neck: supple. No JVD. Carotids 2+ bilaterally no bruits Cor: RRR no rubs, gallops or murmur Lungs: CTA.  Well healed ICD incision. Ab: soft, nontender. nondistended. No HSM. Good bowel sounds  Ext: warm. no cyanosis, clubbing or edema Neuro: alert and oriented. Grossly nonfocal. affect pleasant     ICD Specifications Following MD:  Sherryl Manges, MD     ICD Vendor:  Medtronic     ICD Model Number:  7232     ICD Serial Number:  [id number] ICD DOI:  11/28/2004     ICD Implanting MD:  Lewayne Bunting, MD  Lead 1:    Location: RV     DOI: 11/28/2004     Model #: [id number]     Serial #: [id number]     Status: capped Lead 2:     Location: RV     DOI: 06/01/2007     Model #: [id number]     Serial #: [id number]     Status: active  Indications::  NSVT, LV DYSFUNCTION   ICD Follow Up ICD Dependent:  No      Episodes Coumadin:  Yes  Brady Parameters Mode VVI     Lower Rate Limit:  40      Tachy Zones VF:  200     VT:  250     VT1:  171     MD Comments:  Normal device function.  Impression & Recommendations:  Problem # 1:  AUTOMATIC IMPLANTABLE CARDIAC DEFIBRILLATOR SITU (ICD-V45.02) Her device is working normally. Will recheck in several months.  Problem # 2:  UNSPECIFIED SYSTOLIC HEART FAILURE (ICD-428.20) Her systolic CHF is well controlled. Will follow. I have asked her to maintain a low sodium diet and continue her current meds. Her updated medication list for this problem includes:    Carvedilol 25 Mg Tabs (Carvedilol) .Marland Kitchen... 1 tab two times a day    Furosemide 40 Mg Tabs (Furosemide) .Marland Kitchen... Take one and a half tablets by mouth once daily    Aspirin 81 Mg Tbec (Aspirin) .Marland Kitchen... Take one tablet by mouth daily    Coumadin 5 Mg Tabs (Warfarin sodium) ..... Use as directed by anticoagulation clinic.    Cozaar 25 Mg Tabs (Losartan potassium) ..... One twice a day    Spironolactone 25 Mg Tabs (Spironolactone) .Marland Kitchen... Take 1 tablet by mouth once a day    Nitrostat 0.4 Mg Subl (Nitroglycerin) .Marland Kitchen... 1 tablet under tongue at onset of chest pain; you may repeat every 5 minutes for up to 3 doses.  Problem # 3:  ISCHEMIC CARDIOMYOPATHY (ICD-414.8) She denies anginal symptoms. Will follow. Her updated medication list for this problem includes:    Carvedilol 25 Mg Tabs (Carvedilol) .Marland Kitchen... 1 tab two times a day    Furosemide 40 Mg Tabs (Furosemide) .Marland Kitchen... Take one and a half tablets by mouth once daily    Aspirin 81 Mg Tbec (Aspirin) .Marland Kitchen... Take one tablet by mouth daily    Coumadin 5 Mg Tabs (Warfarin sodium) ..... Use as directed by anticoagulation clinic.    Cozaar 25 Mg Tabs (Losartan potassium) ..... One twice a day     Spironolactone 25 Mg Tabs (Spironolactone) .Marland Kitchen... Take 1 tablet by mouth once a day    Nitrostat 0.4 Mg Subl (Nitroglycerin) .Marland Kitchen... 1 tablet under tongue at onset of chest pain; you may repeat every 5 minutes for up to 3 doses.  Patient Instructions: 1)  Your physician wants you to follow-up in: 12 months with Dr Court Joy will receive a reminder letter in the mail two months in advance. If you don't receive a letter, please call our office to schedule the follow-up appointment. 2)  Your physician recommends that you  continue on your current medications as directed. Please refer to the Current Medication list given to you today.

## 2010-05-27 ENCOUNTER — Encounter: Payer: Self-pay | Admitting: Cardiology

## 2010-05-27 ENCOUNTER — Encounter: Payer: Self-pay | Admitting: Internal Medicine

## 2010-05-27 DIAGNOSIS — I253 Aneurysm of heart: Secondary | ICD-10-CM

## 2010-05-28 ENCOUNTER — Ambulatory Visit (INDEPENDENT_AMBULATORY_CARE_PROVIDER_SITE_OTHER): Payer: Medicare Other | Admitting: Cardiology

## 2010-05-28 ENCOUNTER — Encounter: Payer: Self-pay | Admitting: Cardiology

## 2010-05-28 ENCOUNTER — Encounter (INDEPENDENT_AMBULATORY_CARE_PROVIDER_SITE_OTHER): Payer: Medicare Other

## 2010-05-28 DIAGNOSIS — I5022 Chronic systolic (congestive) heart failure: Secondary | ICD-10-CM

## 2010-05-28 DIAGNOSIS — I2510 Atherosclerotic heart disease of native coronary artery without angina pectoris: Secondary | ICD-10-CM

## 2010-05-28 DIAGNOSIS — Z7901 Long term (current) use of anticoagulants: Secondary | ICD-10-CM

## 2010-05-28 DIAGNOSIS — I4891 Unspecified atrial fibrillation: Secondary | ICD-10-CM

## 2010-05-28 LAB — CONVERTED CEMR LAB: POC INR: 2.1

## 2010-06-04 NOTE — Medication Information (Signed)
Summary: Coumadin Clinic  Anticoagulant Therapy  Managed by: Bethena Midget, RN, BSN Referring MD: Charlies Constable MD PCP: Pearson Grippe Supervising MD: Antoine Poche MD, Fayrene Fearing Indication 1: Atrial Fibrillation (ICD-427.31) Lab Used: [facility] Site: [address] INR POC 2.1 INR RANGE 2 - 3  Dietary changes: no    Health status changes: no    Bleeding/hemorrhagic complications: no    Recent/future hospitalizations: no    Any changes in medication regimen? no       Details: Buprenorphine pain patch 1 every 7 days  Recent/future dental: no  Any missed doses?: no       Is patient compliant with meds? yes      Comments: Seeing Dr Shirlee Latch today  Allergies: 1)  ! Codeine 2)  ! * Tamadol  Anticoagulation Management History:      The patient is taking warfarin and comes in today for a routine follow up visit.  Positive risk factors for bleeding include an age of 75 years or older.  Negative risk factors for bleeding include no history of CVA/TIA and no history of GI bleeding.  The bleeding index is 'intermediate risk'.  Positive CHADS2 values include History of CHF, History of HTN, and Age > 93 years old.  Negative CHADS2 values include Prior Stroke/CVA/TIA.  The start date was 03/09/2006.  Anticoagulation responsible provider: Antoine Poche MD, Fayrene Fearing.  INR POC: 2.1.  Cuvette Lot#: [id number].  Exp: 03/2011.    Anticoagulation Management Assessment/Plan:      The patient's current anticoagulation dose is Coumadin 5 mg tabs: Use as directed by anticoagulation clinic..  The target INR is 2.0-3.0.  The next INR is due 06/25/2010.  Anticoagulation instructions were given to patient.  Results were reviewed/authorized by Bethena Midget, RN, BSN.  She was notified by Bethena Midget, RN, BSN.         Prior Anticoagulation Instructions: INR 2.6  Continue 1 pill everyday except 1/2 pill on Mondays and Fridays. Recheck in 4 weeks.   Current Anticoagulation Instructions: INR 2.1 Continue 1  pill everyday except 1/2 pill on Mondays and Fridays. Recheck in 4 weeks.

## 2010-06-04 NOTE — Assessment & Plan Note (Signed)
Summary: EC6/FORMER PT OF DR BRODIE/SL/AMD   Visit Type:  Follow-up- forme of Dr. Juanda Chance Primary Provider:  Pearson Grippe  CC:  tingling in L-arm on Sun/ only lasted a few sec.  History of Present Illness: 77 yo with history of CAD, ischemic CMP and apical aneurysm with LV thrombus presents for cardiology followup.  She has been seen by Dr. Juanda Chance in the past.  Lately, patient has been symptomatically stable from a cardiopulmonary standpoint.  She has some mild dyspnea walking to the mailbox and walking up steps.  She is not very active and spends most of her time in the house.  No chest pain.  Patient's main complaint is low back pain.  This has been chronic. She plans to start a pain patch, burprenorphine.  BP is running high today, but per patient, it tends to run in the 130s systolic at home   Labs (12/11): K 4.1, creatinine 1.1  ECG: NSR, LVH, IVCD, old ASMI  Current Medications (verified): 1)  Carvedilol 25 Mg Tabs (Carvedilol) .Marland Kitchen.. 1 Tab Two Times A Day 2)  Vytorin 10-40 Mg Tabs (Ezetimibe-Simvastatin) .... Take One Tablet By Mouth Dailyat Bedtime 3)  Furosemide 40 Mg Tabs (Furosemide) .... Take One and A Half Tablets By Mouth Once Daily 4)  Aspirin 81 Mg Tbec (Aspirin) .... Take One Tablet By Mouth Daily 5)  Glucosamine 500 Mg Caps (Glucosamine Sulfate) .... Take 1 Capsule By Mouth Once A Day 6)  Multivitamins  Tabs (Multiple Vitamin) .... Take 1 Tablet By Mouth Once A Day 7)  Vitamin D 1000 Unit Tabs (Cholecalciferol) .... Take 1 Tablet By Mouth Once A Day 8)  Fish Oil 1000 Mg Caps (Omega-3 Fatty Acids) .... Take 1 Capsule By Mouth Once A Day 9)  Potassium Chloride Cr 10 Meq Cr-Caps (Potassium Chloride) .... Take One Tablet By Mouth Daily 10)  Coumadin 5 Mg Tabs (Warfarin Sodium) .... Use As Directed By Anticoagulation Clinic. 11)  Cozaar 25 Mg Tabs (Losartan Potassium) .... One Twice A Day 12)  Spironolactone 25 Mg Tabs (Spironolactone) .... Take 1 Tablet By Mouth Once A Day 13)   Nitrostat 0.4 Mg Subl (Nitroglycerin) .Marland Kitchen.. 1 Tablet Under Tongue At Onset of Chest Pain; You May Repeat Every 5 Minutes For Up To 3 Doses. 14)  Zoloft 50 Mg Tabs (Sertraline Hcl) .... Take 1 Tablet By Mouth Once A Day  Allergies (verified): 1)  ! Codeine 2)  ! Williams Che  Past History:  Past Medical History: 1. HYPERCHOLESTEROLEMIA (ICD-272.0) 2. ARTHRITIS (ICD-716.90): Severe low back pain.  3. ISCHEMIC CARDIOMYOPATHY (ICD-414.8): Echo (1/10) with EF 25-30%, mildly dilated LV, periapical LV aneurysm, calcified LV thrombus, moderate MR.  Medtronic ICD set to VVI.  4. CAD (ICD-414.00): Anterior MI in 2006 with Cypher DES to LAD.  5. HYPERTENSION (ICD-401.9) 6. LV thrombus on coumadin.   Social History: The patient lives alone near [place]. 3 daughters live nearby. Widowed Tobacco Use - No.  Alcohol Use - no  Review of Systems       All systems reviewed and negative except as per HPI.   Vital Signs:  Patient profile:   77 year old female Height:      60 inches Weight:      151 pounds BMI:     29.60 Pulse rate:   70 / minute BP sitting:   152 / 80  (left arm) Cuff size:   regular  Vitals Entered By: Caralee Ates CMA (May 28, 2010 11:16 AM)  Physical Exam  General:  Elderly woman in no distress.  Neck:  Neck supple, no JVD. No masses, thyromegaly or abnormal cervical nodes. Lungs:  Clear bilaterally to auscultation and percussion. Heart:  Non-displaced PMI, chest non-tender; regular rate and rhythm, S1, S2 without murmurs, rubs or gallops. Carotid upstroke normal, no bruit.  Pedals normal pulses. No edema, no varicosities. Abdomen:  Bowel sounds positive; abdomen soft and non-tender without masses, organomegaly, or hernias noted. No hepatosplenomegaly. Extremities:  No clubbing or cyanosis. Neurologic:  Alert and oriented x 3. Psych:  Normal affect.    ICD Specifications Following MD:  Sherryl Manges, MD     ICD Vendor:  Medtronic     ICD Model Number:  7232     ICD  Serial Number:  [id number] ICD DOI:  11/28/2004     ICD Implanting MD:  Lewayne Bunting, MD  Lead 1:    Location: RV     DOI: 11/28/2004     Model #: 9562     Serial #: [id number]     Status: capped Lead 2:    Location: RV     DOI: 06/01/2007     Model #: [id number]     Serial #: [id number]     Status: active  Indications::  NSVT, LV DYSFUNCTION   ICD Follow Up ICD Dependent:  No      Episodes Coumadin:  Yes  Brady Parameters Mode VVI     Lower Rate Limit:  40      Tachy Zones VF:  200     VT:  250     VT1:  171     Impression & Recommendations:  Problem # 1:  ISCHEMIC CARDIOMYOPATHY (ICD-414.8) Patient appears euvolemic on exam.  There are NYHA class II-III symptoms.  Today I will continue current doses of Coreg and spirnonolactone. I will increase losartan to 50 qam, 25 qpm.  BMET/BNP in 2-3 wks.   Problem # 2:  CAD (ICD-414.00) Stable with no ischemic symptoms.  Continue ASA, statin, ACEI, beta blocker.    Problem # 3:  HYPERCHOLESTEROLEMIA (ICD-272.0) Goal LDL < 70.   Patient Instructions: 1)  Your physician has recommended you make the following change in your medication:  2)  Increase Cozaar to 50mg  in the morning and 25mg  in the evening. You can take two 25mg  tablets in the morning and one 25mg  tablet in the evening. 3)  Your physician recommends that you return for lab when you have your next protime---BMP/BNP 428.22  414.01 4)  Your physician recommends that you schedule a follow-up appointment in: 3 months with Dr Shirlee Latch. Prescriptions: COZAAR 50 MG TABS (LOSARTAN POTASSIUM) take one in the morning and one-half in the evening  #45 x 6   Entered by:   Katina Dung, RN, BSN   Authorized by:   Marca Ancona, MD   Signed by:   Katina Dung, RN, BSN on 05/28/2010   Method used:   Electronically to        [facility] [phone] (retail)       [address] [place], [place]  [address]       Ph: [phone] or [phone]       Fax: [phone]    RxID:   [id number]

## 2010-06-11 NOTE — Cardiovascular Report (Signed)
Summary: Office Visit   Office Visit   Imported By: Roderic Ovens 06/07/2010 16:28:37  _____________________________________________________________________  External Attachment:    Type:   Image     Comment:   External Document

## 2010-06-24 LAB — DIFFERENTIAL
Basophils Relative: 0 % (ref 0–1)
Basophils Relative: 1 % (ref 0–1)
Eosinophils Absolute: 0.1 10*3/uL (ref 0.0–0.7)
Eosinophils Relative: 1 % (ref 0–5)
Eosinophils Relative: 3 % (ref 0–5)
Lymphocytes Relative: 17 % (ref 12–46)
Lymphs Abs: 1.1 10*3/uL (ref 0.7–4.0)
Lymphs Abs: 1.3 10*3/uL (ref 0.7–4.0)
Lymphs Abs: 1.7 10*3/uL (ref 0.7–4.0)
Monocytes Absolute: 0.7 10*3/uL (ref 0.1–1.0)
Monocytes Relative: 6 % (ref 3–12)
Monocytes Relative: 7 % (ref 3–12)
Neutro Abs: 5.1 10*3/uL (ref 1.7–7.7)
Neutrophils Relative %: 68 % (ref 43–77)
Neutrophils Relative %: 72 % (ref 43–77)
Neutrophils Relative %: 79 % — ABNORMAL HIGH (ref 43–77)

## 2010-06-24 LAB — BASIC METABOLIC PANEL
BUN: 13 mg/dL (ref 6–23)
BUN: 27 mg/dL — ABNORMAL HIGH (ref 6–23)
BUN: 42 mg/dL — ABNORMAL HIGH (ref 6–23)
CO2: 25 mEq/L (ref 19–32)
CO2: 26 mEq/L (ref 19–32)
Calcium: 8.2 mg/dL — ABNORMAL LOW (ref 8.4–10.5)
Calcium: 8.2 mg/dL — ABNORMAL LOW (ref 8.4–10.5)
Calcium: 8.5 mg/dL (ref 8.4–10.5)
Chloride: 102 mEq/L (ref 96–112)
Chloride: 105 mEq/L (ref 96–112)
Chloride: 110 mEq/L (ref 96–112)
Creatinine, Ser: 0.77 mg/dL (ref 0.4–1.2)
Creatinine, Ser: 0.9 mg/dL (ref 0.4–1.2)
Creatinine, Ser: 0.97 mg/dL (ref 0.4–1.2)
Creatinine, Ser: 2.26 mg/dL — ABNORMAL HIGH (ref 0.4–1.2)
GFR calc Af Amer: >60 mL/min (ref >60)
GFR calc Af Amer: >60 mL/min (ref >60)
GFR calc Af Amer: >60 mL/min (ref >60)
GFR calc Af Amer: >60 mL/min (ref >60)
GFR calc non Af Amer: 53 mL/min — ABNORMAL LOW (ref >60)
GFR calc non Af Amer: >60 mL/min (ref >60)
GFR calc non Af Amer: >60 mL/min (ref >60)
Glucose, Bld: 188 mg/dL — ABNORMAL HIGH (ref 70–99)
Potassium: 4 mEq/L (ref 3.5–5.1)
Potassium: 4.1 mEq/L (ref 3.5–5.1)
Potassium: 4.7 mEq/L (ref 3.5–5.1)
Potassium: 4.8 mEq/L (ref 3.5–5.1)
Sodium: 136 mEq/L (ref 135–145)
Sodium: 140 mEq/L (ref 135–145)

## 2010-06-24 LAB — EXPECTORATED SPUTUM ASSESSMENT W GRAM STAIN, RFLX TO RESP C

## 2010-06-24 LAB — PROTIME-INR
INR: 2.36 — ABNORMAL HIGH (ref 0.00–1.49)
INR: 2.4 — ABNORMAL HIGH (ref 0.00–1.49)
INR: 2.68 — ABNORMAL HIGH (ref 0.00–1.49)
INR: 3.67 — ABNORMAL HIGH (ref 0.00–1.49)
INR: 3.73 — ABNORMAL HIGH (ref 0.00–1.49)
INR: 4.09 — ABNORMAL HIGH (ref 0.00–1.49)
Prothrombin Time: 23.9 seconds — ABNORMAL HIGH (ref 11.6–15.2)
Prothrombin Time: 25.6 seconds — ABNORMAL HIGH (ref 11.6–15.2)
Prothrombin Time: 36.6 seconds — ABNORMAL HIGH (ref 11.6–15.2)

## 2010-06-24 LAB — POCT CARDIAC MARKERS
CKMB, poc: 4.3 ng/mL (ref 1.0–8.0)
Myoglobin, poc: 162 ng/mL (ref 12–200)
Troponin i, poc: <0.05 ng/mL (ref 0.00–0.09)

## 2010-06-24 LAB — PHOSPHORUS: Phosphorus: 2.4 mg/dL (ref 2.3–4.6)

## 2010-06-24 LAB — COMPREHENSIVE METABOLIC PANEL
ALT: 25 U/L (ref 0–35)
AST: 32 U/L (ref 0–37)
Alkaline Phosphatase: 59 U/L (ref 39–117)
GFR calc Af Amer: >60 mL/min (ref >60)
Glucose, Bld: 163 mg/dL — ABNORMAL HIGH (ref 70–99)
Potassium: 4.2 mEq/L (ref 3.5–5.1)
Sodium: 135 mEq/L (ref 135–145)
Total Protein: 7.4 g/dL (ref 6.0–8.3)

## 2010-06-24 LAB — CBC
HCT: 28.7 % — ABNORMAL LOW (ref 36.0–46.0)
HCT: 29 % — ABNORMAL LOW (ref 36.0–46.0)
Hemoglobin: 11.6 g/dL — ABNORMAL LOW (ref 12.0–15.0)
Hemoglobin: 9.9 g/dL — ABNORMAL LOW (ref 12.0–15.0)
MCHC: 33.3 g/dL (ref 30.0–36.0)
MCHC: 33.4 g/dL (ref 30.0–36.0)
MCV: 96.3 fL (ref 78.0–100.0)
MCV: 97 fL (ref 78.0–100.0)
Platelets: 182 10*3/uL (ref 150–400)
RBC: 2.96 MIL/uL — ABNORMAL LOW (ref 3.87–5.11)
RBC: 3.07 MIL/uL — ABNORMAL LOW (ref 3.87–5.11)
RBC: 3.27 MIL/uL — ABNORMAL LOW (ref 3.87–5.11)
RBC: 3.58 MIL/uL — ABNORMAL LOW (ref 3.87–5.11)
RBC: 3.6 MIL/uL — ABNORMAL LOW (ref 3.87–5.11)
RDW: 12.6 % (ref 11.5–15.5)
RDW: 12.7 % (ref 11.5–15.5)
RDW: 12.9 % (ref 11.5–15.5)
WBC: 6.5 10*3/uL (ref 4.0–10.5)
WBC: 7.5 10*3/uL (ref 4.0–10.5)
WBC: 9.2 10*3/uL (ref 4.0–10.5)
WBC: 9.4 10*3/uL (ref 4.0–10.5)

## 2010-06-24 LAB — URINALYSIS, ROUTINE W REFLEX MICROSCOPIC
Bilirubin Urine: NEGATIVE
Glucose, UA: NEGATIVE mg/dL
Hgb urine dipstick: NEGATIVE
Ketones, ur: NEGATIVE mg/dL
Nitrite: NEGATIVE
Specific Gravity, Urine: 1.019 (ref 1.005–1.030)
pH: 7 (ref 5.0–8.0)

## 2010-06-24 LAB — CULTURE, RESPIRATORY W GRAM STAIN: Culture: NO GROWTH

## 2010-06-24 LAB — CULTURE, BLOOD (ROUTINE X 2): Culture: NO GROWTH

## 2010-06-24 LAB — RAPID STREP SCREEN (MED CTR MEBANE ONLY): Streptococcus, Group A Screen (Direct): NEGATIVE

## 2010-06-24 LAB — MAGNESIUM: Magnesium: 2.1 mg/dL (ref 1.5–2.5)

## 2010-06-24 LAB — BRAIN NATRIURETIC PEPTIDE: Pro B Natriuretic peptide (BNP): 308 pg/mL — ABNORMAL HIGH (ref 0.0–100.0)

## 2010-06-24 LAB — LEGIONELLA ANTIGEN, URINE

## 2010-06-25 ENCOUNTER — Other Ambulatory Visit (INDEPENDENT_AMBULATORY_CARE_PROVIDER_SITE_OTHER): Payer: Medicare Other | Admitting: *Deleted

## 2010-06-25 ENCOUNTER — Ambulatory Visit (INDEPENDENT_AMBULATORY_CARE_PROVIDER_SITE_OTHER): Payer: Medicare Other | Admitting: *Deleted

## 2010-06-25 DIAGNOSIS — Z7901 Long term (current) use of anticoagulants: Secondary | ICD-10-CM

## 2010-06-25 DIAGNOSIS — I4891 Unspecified atrial fibrillation: Secondary | ICD-10-CM

## 2010-06-25 DIAGNOSIS — I253 Aneurysm of heart: Secondary | ICD-10-CM

## 2010-06-25 DIAGNOSIS — I5022 Chronic systolic (congestive) heart failure: Secondary | ICD-10-CM

## 2010-06-25 DIAGNOSIS — R0602 Shortness of breath: Secondary | ICD-10-CM

## 2010-06-25 DIAGNOSIS — I2510 Atherosclerotic heart disease of native coronary artery without angina pectoris: Secondary | ICD-10-CM

## 2010-06-25 LAB — POCT INR: INR: 3.1

## 2010-06-25 LAB — BRAIN NATRIURETIC PEPTIDE: Pro B Natriuretic peptide (BNP): 270.8 pg/mL — ABNORMAL HIGH (ref 0.0–100.0)

## 2010-06-25 NOTE — Patient Instructions (Signed)
Take 1/2 tablet today, then resume 1 tablet daily except 1/2 tablet on Mondays and Fridays.  Recheck in 4 weeks.

## 2010-06-26 LAB — BASIC METABOLIC PANEL
Chloride: 105 mEq/L (ref 96–112)
Creatinine, Ser: 1.2 mg/dL (ref 0.4–1.2)
Potassium: 4.6 mEq/L (ref 3.5–5.1)

## 2010-07-19 ENCOUNTER — Telehealth: Payer: Self-pay | Admitting: Cardiology

## 2010-07-19 MED ORDER — WARFARIN SODIUM 2.5 MG PO TABS: 2.5000 mg | ORAL_TABLET | ORAL | Status: DC

## 2010-07-19 NOTE — Telephone Encounter (Signed)
Coumadin Clinic aware

## 2010-07-19 NOTE — Telephone Encounter (Signed)
Called spoke with pt pt states she has been taking Warfarin 2.5mg  tablets, 1 tablet daily except 1/2 tablet on Mondays and Fridays.  Pt states she has been taking the 2.5mg  tablets for several months now.  Per pharmacy fax request pt last filled 2.5mg  warfarin tablets on 05/21/10. Pt aware rx refill sent to pharmacy.

## 2010-07-19 NOTE — Telephone Encounter (Signed)
Went to get coumadin and the phar told her she was not taking it anymore per our office.  Phar.  Is [facility] in [place].  She has no coumadin to take.

## 2010-07-23 ENCOUNTER — Ambulatory Visit (INDEPENDENT_AMBULATORY_CARE_PROVIDER_SITE_OTHER): Payer: Medicare Other | Admitting: *Deleted

## 2010-07-23 DIAGNOSIS — Z7901 Long term (current) use of anticoagulants: Secondary | ICD-10-CM

## 2010-07-23 DIAGNOSIS — I253 Aneurysm of heart: Secondary | ICD-10-CM

## 2010-07-23 LAB — POCT INR: INR: 2.8

## 2010-08-06 NOTE — Assessment & Plan Note (Signed)
[facility]                            CARDIOLOGY OFFICE NOTE   NAME:[name], [name]                       MRN:          [id number]  DATE:12/24/2007                            DOB:          09-06-1925    PRIMARY CARE PHYSICIAN:  Massie Maroon, MD   CLINICAL HISTORY:  [name] returns for followup management of a  congestive heart failure.  She had coronary artery disease and anterior  wall infarction in 2006, treated with a Cypher drug-eluting stent to the  LAD and has ejection fraction that has been persistently low at about  20%.  I saw her 2 weeks ago after she had an episode of shortness of  breath and chest pain.  We thought she might have some mild volume  overload, we increased her lisinopril and Lasix from 20-40 a day.  She  saw Dr. Jacolyn [name] a week later and was doing well.  She says she has  had no recurrence spells and feels like she has been doing pretty well.  She does say her blood pressure has been on the low side and at one time  had one reading of 87.   PAST MEDICAL HISTORY:  Significant for hypertension and hyperlipidemia.   CURRENT MEDICATIONS:  1. Carvedilol.  2. Vytorin.  3. Lasix 40 mg daily.  4. K-Dur 10 mEq daily.  5. Aspirin 81 mg daily.  6. Coumadin.  7. Fosamax.  8. Glucosamine.  9. Fish oil.  10.Aldactone 25 mg daily.  11.Enalapril 20 mg b.i.d.   PHYSICAL EXAMINATION:  VITAL SIGNS:  Blood pressure is 99/59 and pulse  is 63 and regular.  She done okay.  NECK:  There was no venous distention.  The carotid pulses were full  without bruits.  CHEST:  Clear without rales or rhonchi.  HEART:  Rhythm is regular.  No murmurs or gallops.  ABDOMEN:  Soft with normal bowel sounds.  EXTREMITIES:  Peripheral pulses are full with no peripheral edema.   IMPRESSION:  1. Systolic congestive heart failure, now improved and euvolemic.  2. Ischemic cardiomyopathy, ejection fraction of 20%.  3. Coronary artery disease,  status post anterior wall myocardial      infarction in 2006, treated with a Cypher drug-eluting stent to the      left anterior descending.  4. Status post ventricular sensing-ventricular pacing inhibition-      implantable cardioverter-defibrillator.  5. Hypertension.  6. Hyperlipidemia.  7. Left ventricular thrombus by echocardiogram on Coumadin therapy.   RECOMMENDATIONS:  I think [name] is doing fairly well at present.  Her  blood pressure is a little low, and I think I will cut back her  enalapril back to 10 mg 1-1/2 tablets twice a day instead of 2 twice a  day.  We will continue her Lasix at the higher dose.   I will plan to see her back in about 6 weeks.     Bruce Elvera Lennox Juanda Chance, MD, [facility]  Electronically Signed    BRB/MedQ  DD: 12/24/2007  DT: 12/25/2007  Job #: [id number]

## 2010-08-06 NOTE — Assessment & Plan Note (Signed)
[facility]                            CARDIOLOGY OFFICE NOTE   NAME:[name], [name] A                       MRN:          [id number]  DATE:02/07/2008                            DOB:          [birth year]    ADDENDUM   Her repeat blood pressure was 160/70.  So, we are going to increase her  enalapril from 15 mg twice a day to 20 mg twice a day.  We will wait and  get the BMP and CBC next week with her prothrombin time and a blood  pressure check.     Bruce Elvera Lennox Juanda Chance, MD, [facility]     BRB/MedQ  DD: 02/07/2008  DT: 02/08/2008  Job #: [id number]

## 2010-08-06 NOTE — Assessment & Plan Note (Signed)
[facility]                            CARDIOLOGY OFFICE NOTE   NAME:[name], [name] BUFFIN                       MRN:          [id number]  DATE:12/15/2007                            DOB:          12-08-1925    PRIMARY CARDIOLOGIST:  Everardo Beals. Juanda Chance, MD, [facility]   PRIMARY CARE PHYSICIAN:  Massie Maroon, MD   ELECTROPHYSIOLOGIST:  Doylene Canning. Ladona Ridgel, MD   HISTORY OF PRESENT ILLNESS:  [name] is a very pleasant 77 year old  white female, patient of Dr. Charlies Constable, who is here for 1-week  followup to evaluate for heart failure and chest pain.  She saw Dr.  Juanda Chance last week in the office, at which time she was complaining of  some chest pressure and shortness of breath.  He was not sure whether or  not she had volume overload and/or ischemia.  He increased her  lisinopril to 20 mg b.i.d. as well as her furosemide.  Her BNP came back  high at 361.  Other labs were normal.   Over the past week, she has felt better.  She stated initially when she  increased her furosemide, she lost 2 or 3 pounds in the first couple of  days.  She thinks she may have gotten some extra salt in her diet before  she saw Dr. Juanda Chance, but she is not 100% sure about this.  She denies any  further chest pain, heaviness, tightness, dyspnea, orthopnea, or  paroxysmal nocturnal dyspnea, as she gets a little short of breath if  she does anything strenuous.  She does have an ischemic cardiomyopathy,  ejection fraction of 15-20%, and has a Medtronic ICD.   CURRENT MEDICATIONS:  1. Carvedilol 25 mg b.i.d.  2. Vytorin 10/40 mg at bedtime.  3. Lasix 40 mg daily.  4. K-Dur 10 mEq daily.  5. Aspirin 81 mg daily.  6. Coumadin as directed.  7. Fosamax 70 mg weekly.  8. Glucosamine/chondroitin.  9. Multivitamin daily.  10.Vitamin D daily.  11.Alpha-lipoic acid 1000 mg daily.  12.Fish oil daily.  13.Aldactone 25 mg daily.  14.Enalapril 20 mg b.i.d.   PHYSICAL EXAMINATION:  GENERAL:  This is a  very pleasant 77 year old  white female, in no acute distress.  VITAL SIGNS:  Blood pressure 133/71, pulse 61, weight 150, which is  actually up 1 pound from last week.  NECK:  Without JVD, HJR, bruit, or thyroid enlargement.  LUNGS:  Clear anterior, posterior, and lateral.  HEART:  Regular rate and rhythm at 60 beats per minute.  Normal S1 and  S2.  No murmur, rub, bruit, thrill, or heave noted.  ABDOMEN:  Soft without organomegaly, masses, lesions, or abnormal  tenderness.  EXTREMITIES:  Without cyanosis, clubbing, or edema.  She has good distal  pulses.   IMPRESSION:  1. Acute on chronic heart failure, resolved.  2. Ischemic cardiomyopathy, ejection fraction 15-20% status post      Medtronic implantable cardioverter-defibrillator.  3. Coronary artery disease status post anterior wall myocardial      infarction in 2006 treated with Cypher stent to the left anterior  descending coronary artery.  4. Left ventricular thrombus by echocardiogram in 2007, on chronic      Coumadin.  5. Hypertension.  6. Hypercholesterolemia.  7. 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   PLAN:  The patient seems to be doing better on the increased diuretic  and lisinopril.  We will repeat a BNP and BMET today.  I have asked her  to follow a 2-g sodium diet, and she can follow up with Dr. Charlies Constable  next week.  She is to call in the interim if she has any further  problems.      Jacolyn Reedy, PA-C  Electronically Signed      Luis Abed, MD, [facility]  Electronically Signed   ML/MedQ  DD: 12/15/2007  DT: 12/15/2007  Job #: [id number]

## 2010-08-06 NOTE — Assessment & Plan Note (Signed)
[facility]                          CHRONIC HEART FAILURE NOTE   NAME:[name]                       MRN:          [id number]  DATE:06/08/2007                            DOB:          08/08/1925    PRIMARY CARDIOLOGIST:  Charlies Constable.   PRIMARY CARE:  Pearson Grippe.   E P:  Lewayne Bunting   [name] returns today for follow-up of her congestive heart failure  status post recent hospitalization for implantation of a new  cardioverter defibrillator lead, secondary to previous lead malfunction.  [name] states she has done quite well since she was discharged home on  March 11. No problems with ICD site, denies any shortness of breath,  orthopnea, PND or firing from her defibrillator.  Has resumed her  Coumadin, had her INR checked yesterday, it was 1.9 and a follow-up  appointment in the Coumadin Clinic on Monday.  Denies any fever or  chills since being discharged home, states compliance with medications.   PAST MEDICAL HISTORY:  1. Congestive heart failure secondary to ischemic cardiomyopathy with      EF 15-20%.  2. Coronary artery disease status post MI in 2006 with placement of a      Cypher drug-eluting stent to the LAD.  3. Hypertension.  4. Hypercholesteremia.  5. Left ventricular mural thrombus by echocardiogram in 2007 requiring      chronic anticoagulation therapy.  6. Status post placement of ICD device by Dr. Lewayne Bunting.  The      patient has a Medtronic Maximo device.   REVIEW OF SYSTEMS:  As stated above, otherwise negative.   CURRENT MEDICATIONS:  Include carvedilol 25 mg b.i.d., Vytorin  10/40  milligrams q.h.s., enalapril 10 mg b.i.d., Lasix 20 mg daily, K-Dur 10  mEq daily, aspirin 81 mg daily, Coumadin as directed, Fosamax weekly,  glucosamine chondroitin daily, multivitamin daily, vitamin D daily,  alpha-lipoic acid 1000 mg daily, Aldactone 25 mg daily.   PHYSICAL EXAM:  Weight 144.  Weight  is down 2 pounds, blood  pressure  130/74 with a heart rate of 56.  [name] is in no acute distress.  No  signs of jugular vein distention at 45 degrees angle.  LUNGS:  Clear to auscultation bilaterally.  CARDIOVASCULAR exam reveals S1-S2, regular rate and rhythm, left chest  ICD site.  Steri-Strips dry and intact with minimal swelling.  No  redness or drainage noted from site..  ABDOMEN:  Soft, nontender, positive bowel sounds.  LOWER EXTREMITIES:  Without clubbing, cyanosis or edema.  NEUROLOGICALLY alert and oriented x3.   IMPRESSION:  Congestive heart failure secondary to ischemic  cardiomyopathy with EF currently 20%.  The patient generally maintains  class II symptoms.  We will continue current medications, see Ms. Cedotal  back in a month for followup.      Dorian Pod, ACNP  Electronically Signed      Rollene Rotunda, MD, [facility]  Electronically Signed   MB/MedQ  DD: 06/08/2007  DT: 06/08/2007  Job #: [id number]   cc:   Massie Maroon, MD

## 2010-08-06 NOTE — Assessment & Plan Note (Signed)
[facility]                            CARDIOLOGY OFFICE NOTE   PAW, KARSTENS                       MRN:          [id number]  DATE:02/07/2008                            DOB:          04/23/1925    PRIMARY CARE PHYSICIAN:  Massie Maroon, MD   CLINICAL HISTORY:  [name]) returned for followup management  of congestive heart failure.  She is 77 years old and had an anterior  wall infarction in 2006 treated with a Cypher stent to the LAD.  She has  had persistently low ejection fraction in the range 20% and borderline  compensated heart failure.  She had some mild exacerbation of her heart  failure with increased shortness of breath recently.  We have been  titrating her medicines.  He also has an LV thrombus and has been on  Coumadin for that.  She says she has been doing well over the last few  weeks with no chest pain or palpitations.  She gets short of breath with  certain activities, but has had no shortness of breath at rest.   Her past history is significant for hypertension and hyperlipidemia.   Her current medications include:  1. Carvedilol 25 mg b.i.d.  2. Vytorin 10/40 daily.  3. Lasix 40 mg daily.  4. K-Dur 10 mEq daily.  5. Aspirin 81 mg daily.  6. Coumadin.  7. Fosamax.  8. Glucosamine.  9. Aldactone 25 mg daily.  10.Enalapril 5 mg 3 tablets b.i.d.   On examination, blood pressure is 154/76 and the pulse 69 and regular  (she says her blood pressure was 117 systolic at home).  There was no  venous distension.  The carotid pulses were full.  There were no bruits.  Chest was clear without rales or rhonchi.  The cardiac rhythm was  regular.  She had no murmurs or gallops.  Abdomen was soft without  organomegaly.  Peripheral pulses were full with no peripheral edema.  Electrocardiogram showed a previous anterior wall myocardial infarction  with left axis deviation and lateral T-wave changes.   IMPRESSION:  1. Coronary  heart disease status post anterior wall myocardial      function in 2006, treated with a Cypher drug-eluting stent to the      left anterior descending.  2. Ischemic cardiomyopathy, ejection fraction 20%.  3. Systolic heart failure, euvolemic class III.  4. Status post backup VVI implantable cardioverter-defibrillator.  5. Hypertension, hyperlipidemia.  6. Coumadin therapy for left ventricular thrombus.   RECOMMENDATIONS:  I think [name] is doing reasonably well.  I will not  make any change in the medications today.  We will get a CBC and BNP  today.  I will see her back in follow up in 2 months.   ADDENDUM:  Her repeat blood pressure was 160/70.  So, we are going to  increase her enalapril from 15 mg twice a day to 20 mg twice a day.  We  will wait and get the BMP and CBC next week with her prothrombin time  and a blood pressure check.  Bruce Elvera Lennox Juanda Chance, MD, [facility]  Electronically Signed    BRB/MedQ  DD: 02/07/2008  DT: 02/08/2008  Job #: [id number]

## 2010-08-06 NOTE — Assessment & Plan Note (Signed)
[facility]                         ELECTROPHYSIOLOGY OFFICE NOTE   NAME:Delude, KARAH CARUTHERS                       MRN:          [id number]  DATE:02/23/2007                            DOB:          20-Aug-1925    Ms. Naab returns today for followup.  She is a very pleasant woman with  a history of anterior wall MI with a history of ischemic cardiomyopathy  status post ICD insertion.  She returns today for followup.  Despite her  advanced age, she continues to do quite well.  She denied chest pain,  she denies shortness of breath.  She has had no intercurrent ICD  therapies by her report.  She denies chest pain.   Her medications include:  1. Vytorin 10/40 a day.  2. Fosamax weekly.  3. Enalapril 5 b.i.d.  4. Coumadin as directed.  5. Lasix 20 a day.  6. Potassium 10 a day.  7. Coreg 12.5 twice daily.   Interrogation of her defibrillator demonstrates a Medtronic Maximo with  R waves of 16, the impedance was 512, the threshold a volt at 0.3.  Battery voltage was 3.16 volts.  There were no intercurrent ICD  therapies.   IMPRESSION:  1. Ischemic cardiomyopathy.  2. Congestive heart failure.  3. Status post implantable cardioverter-defibrillator insertion.   DISCUSSION:  Overall, Ms. Asche is stable.  Her defibrillator is working  normally.  Her heart failure is well controlled.  We will see her back  in the office for ICD check in 1 year, and she will follow up in our  [facility] program.     Doylene Canning. Ladona Ridgel, MD  Electronically Signed    GWT/MedQ  DD: 02/23/2007  DT: 02/23/2007  Job #: [id number]

## 2010-08-06 NOTE — Assessment & Plan Note (Signed)
[facility]                          CHRONIC HEART FAILURE NOTE   NAME:[name], [name]                       MRN:          [id number]  DATE:05/17/2007                            DOB:          Jan 10, 1926    PRIMARY CARDIOLOGIST:  Dr. Charlies Constable.   PRIMARY CARE:  Massie Maroon, MD   [name] returns to the heart failure clinic today for further  evaluation of her congestive heart failure which is secondary to  ischemic cardiomyopathy.  Most recent echocardiogram done in December  2008, showed an EF of 15-20% essentially no improvement from previous  echocardiogram in 2007.  [name] states she has been doing quite well  since I last saw her.  She continues to walk one mile every other day  inside the [employer] store.  She denies any lightheadedness, dizziness,  presyncope, syncopal episodes, peripheral edema.  No firing from her  defibrillator.  She actually just saw Dr. Juanda Chance on May 03, 2007,  at which time he felt the patient was stable and no changes were made in  her medications.  [name] continues to do quite well with her low  ejection fraction, maintaining class I generally class II symptoms.   PAST MEDICAL HISTORY:  1. Congestive heart failure secondary to ischemic cardiomyopathy with      EF 15-20%.  2. Coronary artery disease status post MI in 2006 with a Cypher drug-      eluting stent to LAD.  3. Hypertension.  4. Hypercholesteremia.  5. Limited range of motion secondary to arthritic changes in back and      hips.  6. Left ventricular mural thrombus by echocardiogram in 2007,      requiring chronic anticoagulation therapy.  7. Status post placement of ICD followed by Dr. Lewayne Bunting.  8. History of kidney stones.   REVIEW OF SYSTEMS:  As stated above, otherwise negative.   CURRENT MEDICATIONS:  1. Carvedilol 25 mg b.i.d.  2. Vytorin 10/40 nightly.  3. Enalapril 10 mg b.i.d.  4. Lasix 20 mg daily.  5. K-Dur 10 mEq daily.   6. Aspirin 81 mg daily.  7. Coumadin per Cou[facility]in Clinic.  8. Fosamax 70 mg weekly.  9. Glucosamine chondroitin daily.  10.Multivitamin daily.  11.Vitamin D daily.  12.Alpha-Lipoic acid daily.  13.Aldactone 25 mg daily.   PHYSICAL EXAMINATION:  VITAL SIGNS:  Weight 246, weight is up 1 pound,  blood pressure 115/73 with heart rate of 57.  NECK:  No signs of jugular vein distention of 45 degree angle.  LUNGS:  Clear to auscultation bilaterally.  CARDIOVASCULAR:  Exam reveals S1 and S2, regular rate and rhythm with a  2/6 systolic ejection murmur.  ABDOMEN:  Soft, nontender, positive bowel sounds obese.  LOWER EXTREMITIES:  Without clubbing, cyanosis or edema.  Neurological:  Alert and oriented x3.   IMPRESSION:  Congestive heart failure secondary to ischemic  cardiomyopathy with class II symptoms without signs of volume overload  today.  Continue current medications, check BMET and BMP today.  Follow-  up in 6 weeks.  [name], ACNP  Electronically Signed      [name]. Bensimhon, MD  Electronically Signed   MB/MedQ  DD: 05/17/2007  DT: 05/17/2007  Job #: [id number]   cc:   Massie Maroon, MD

## 2010-08-06 NOTE — Op Note (Signed)
[name], [name]                ACCOUNT NO.:  [id number]   MEDICAL RECORD NO.:  [id number]          PATIENT TYPE:  INP   LOCATION:  2029                         FACILITY:  [facility]   PHYSICIAN:  Doylene Canning. Ladona Ridgel, MD    DATE OF BIRTH:  August 27, 1925   DATE OF PROCEDURE:  06/01/2007  DATE OF DISCHARGE:                               OPERATIVE REPORT   PROCEDURE PERFORMED:  Implantation of a new implantable cardioverter-  defibrillator lead.   INDICATION:  Prior defibrillator lead implant with broken lead.   INTRODUCTION:  The patient is an 77 year old woman with an ischemic  cardiomyopathy and congestive heart failure, chronic class II who is  admitted to hospital with complaint that her defibrillator was beeping.  Interrogation of the device subsequently demonstrated that she in fact  had an ICD lead malfunction which was a 6 40 Sprint Fidelis lead.  She  is now referred for ICD lead revision.   PROCEDURE:  After informed consent was obtained, the patient is taken  diagnostic EP lab in fasting state.  After usual preparation, draping,  intravenous fentanyl and Midazolam was given for sedation.  Lidocaine 3  mL  infiltrated in the left infraclavicular region and a 7 cm incision  was carried out over this region.  Electrocautery was utilized to  dissect down to the fascial plane.  The left subclavian vein was then  punctured and the Medtronic Sprint Quattro Secure model [phone] 65-cm  active fixation defibrillation lead, serial number [id number] was  advanced into the right ventricle.  Care was taken not to have the new  lead touching the old lead.  Because of the patient's advanced age, it  was deemed most appropriate not to remove the old lead which had been  placed 2-1/2 years __________.  With all this accomplished, the new  defibrillator lead was placed on the RV septum and actively fixed.  Her  R-waves measured 24 mV and the pacing impedance was 500 ohms and  threshold 0.4 volts at 0.5  milliseconds and 10 volt pacing did not  stimulate the diaphragm.  With these satisfactory parameters the leads  were secured to subpectoralis fascia with figure-of-eight silk suture.  The sewing sleeve was also secured with silk suture.  Electrocautery was  then utilized to enter the previous subcutaneous pocket and the old  Medtronic Maximo VR was removed.  The old device was connected to the  new defibrillation lead and placed back in the subcutaneous pocket.  The  pocket was irrigated with kanamycin.  At this point, the patient was  more deeply sedated and defibrillation threshold testing was carried  out.   After the patient more deeply sedated with fentanyl and Versed, VF was  induced with T-wave shock and a 15 joule shock was delivered which  terminated VF and restored sinus rhythm.  At this point no additional  defibrillation threshold testing was carried out.  The incision was  closed after the pocket was again irrigated with kanamycin and the  incision closed with 2-0 Vicryl and 3-0 Vicryl.  Benzoin painted on the  skin, Steri-Strips  were applied and pressure dressing was placed.  The  patient was returned to her room in satisfactory condition.   COMPLICATIONS:  __________   ___________ active fixation defibrillation lead in a patient with an  ischemic cardiomyopathy, chronic congestive heart failure who has a  broken Medtronic Sprint Fidelis defibrillation lead.      Doylene Canning. Ladona Ridgel, MD  Electronically Signed     GWT/MEDQ  D:  06/01/2007  T:  06/02/2007  Job:  [id number]   cc:   Everardo Beals. Juanda Chance, MD, [facility]

## 2010-08-06 NOTE — Assessment & Plan Note (Signed)
[name]                            CARDIOLOGY OFFICE NOTE   NAME:[name], [name] RABOLD                       MRN:          [id number]  DATE:10/26/2006                            DOB:          [birth year]    PRIMARY CARE PHYSICIAN:  Dr. Pearson [name].   PAST MEDICAL HISTORY:  [name] is 76 years old and had anterior wall  myocardial infarction in 2006 treated with a Cypher drug-eluting stent  to the LAD.  She was left with a reduced ejection fraction of about 25%  which has not improved.  She subsequently had replacement of a backup  VVI ICD.   She says she has done quite well over the last few months.  She has had  no recent chest pain.  She has shortness of breath only with moderate  activity.  There are no palpitations.  She had an echocardiogram done in  December 2007 which suggests a left ventricular thrombus, and she has  been on Coumadin since that time.   PAST MEDICAL HISTORY:  Significant for hypertension, hyperlipidemia.   CURRENT MEDICATIONS:  1. Vytorin.  2. Fosamax.  3. Glucosamine.  4. Enalapril.  5. Coumadin.  6. Lasix.  7. K-Dur.  8. Aspirin.  9. Coreg.   PHYSICAL EXAMINATION:  VITAL SIGNS:  Blood pressure 121/69, pulse 57 and  regular.  NECK:  There was no venous distention.  Carotid pulses were full without  bruits.  CHEST:  Clear.  HEART:  Cardiac rhythm was regular.  There were no murmurs or gallops.  ABDOMEN:  Soft with normal bowel sounds.  There was no  hepatosplenomegaly.  EXTREMITIES:  Peripheral pulses are full, and there is peripheral edema.   Electrogram showed an old anterior wall myocardial infarction with  anterolateral T wave changes and left axis deviation.   IMPRESSION:  1. Coronary artery disease status post anterior wall myocardial      infarction 2006 treated with a Cypher stent to the LAD.  2. Ischemic cardiomyopathy with ejection fraction 25%.  3. Class 2-3 congestive heart failure.  4. Status post  ICD (backup VVI).  5. Left ventricular mural thrombus detected by echocardiography      December 2007.  6. Hypertension.  7. Hyperlipidemia.  8. Lead alert with Medtronic lead [id number].   RECOMMENDATIONS:  I think [name] is doing well.  We will plan to get a  CBC and BNP on her today.  I will not change her medications today.  We  will plan to see her back in four months in December, and we will get a  repeat echocardiogram at that time before her visit.  Will make a  decision whether she needs to be on long-term Coumadin or not depending  on the results of that echocardiogram.     [name] R. Juanda Chance, MD, [facility]  Electronically Signed    BRB/MedQ  DD: 10/26/2006  DT: 10/26/2006  Job #: [id number]

## 2010-08-06 NOTE — Assessment & Plan Note (Signed)
[facility]                          CHRONIC HEART FAILURE NOTE   NAME:[name], [name] STRATHMAN                       MRN:          [id number]  DATE:10/26/2007                            DOB:          [birth year]    PRIMARY CARE:  Massie Maroon, MD   PRIMARY CARDIOLOGIST:  Everardo Beals. Juanda Chance, MD, FACC   ELECTROPHYSIOLOGY:  Doylene Canning. Ladona Ridgel, MD   [name] returns today for followup of her congestive heart failure,  which is secondary to ischemic cardiomyopathy with an EF of 15-20%,  status post recent echocardiogram without much improvement.  [name]  underwent implant of a Medtronic Maximo ICD recently.  She has done  quite well.  Interrogation of her device today shows no shocks delivered  and no ventricular ectopy.  The patient has a schedule to follow up with  Dr. Ladona Ridgel in 6 months.  [name] states she has been doing well.  She  is a little saddened over the death of a nephew secondary to drug  overdose.  She states the family has been mourning from this, but she  has already made arrangements to take a trip to the beach with her  sister next week.  She is looking forward to this.  She denies any  lightheadedness, dizziness, shortness of breath at rest, orthopnea, PND,  or any signs of lower extremity edema.  Continues to try to exercise as  much as she can, limited by her joint discomfort.   PAST MEDICAL HISTORY:  1. Congestive heart failure secondary to ischemic cardiomyopathy with      EF of 15-20% status post placement of a Medtronic Maximo ICD.  2. Coronary artery disease.  3. Hypertension.  4. Hypercholesteremia.  5. Left ventricular mural thrombus by echocardiogram requiring chronic      anticoagulation therapy.   REVIEW OF SYSTEMS:  As stated above, otherwise negative.   CURRENT MEDICATIONS:  1. Carvedilol 25 mg b.i.d.  2. Vytorin 10/40.  3. Enalapril 10 mg b.i.d.  4. Lasix 20 mg daily.  5. K-Dur 10 mEq daily.  6. Aspirin 81 daily.  7.  Coumadin as directed.  8. Fosamax weekly.  9. Glucosamine chondroitin.  10.Multivitamin daily.  11.Vitamin D daily.  12.Alpha lipoic acid daily.  13.Aldactone 25 mg daily.  14.Fish oil 2 capsules daily.   P.r.n. medications include Tylenol, nitroglycerin, and MiraLax.   PHYSICAL EXAMINATION:  VITAL SIGNS:  Weight 149 pounds, weight is  consistent; blood pressure 123/75 with heart rate of 60.  GENERAL:  [name] is in no acute distress.  No signs of jugular vein  distention at 45-degree angle.  LUNGS:  Clear to auscultation bilaterally.  CARDIOVASCULAR:  Reveals S1 and S2.  Regular rate and rhythm.  ABDOMEN:  Soft and nontender.  Positive bowel sounds.  EXTREMITIES:  Lower extremities without clubbing, cyanosis, or edema.  NEUROLOGIC:  Alert and oriented x3.   IMPRESSION:  Congestive heart failure secondary to ischemic  cardiomyopathy without signs of volume overload at this time.  No events  noted by interrogation of ICD.  We will continue current  medications.  [name] needs to follow up with Dr. Juanda Chance for a routine Cardiology  visit.  We will arrange this at next visit.      Dorian Pod, ACNP  Electronically Signed      Rollene Rotunda, MD, [facility]  Electronically Signed   MB/MedQ  DD: 10/26/2007  DT: 10/27/2007  Job #: [id number]

## 2010-08-06 NOTE — Assessment & Plan Note (Signed)
[facility]                            CARDIOLOGY OFFICE NOTE   [name], [name]                       MRN:          [id number]  DATE:11/10/2007                            DOB:          [birth year]    PRIMARY CARE PHYSICIAN:  Massie Maroon, MD   CLINICAL HISTORY:  [name] is 77 years old and returned for a followup  management of her congestive heart failure and ischemic cardiomyopathy.  She had an anterior wall infarction in 2006, treated with a Cypher stent  to the LAD and has been left with severe left ventricular dysfunction  with ejection fraction of about 20%.  She has been seen recently in the  Heart Failure Clinic for further titration of her medications.   She said she also has an ICD in place.   She said she has been doing reasonably well.  No change in shortness of  breath and no chest pain or palpitations.  She was at the beach since  past week and feel like she overate, but did not feel like she ate too  much salt.   PAST MEDICAL HISTORY:  Significant for hypertension and hyperlipidemia.   CURRENT MEDICATIONS:  1. Carvedilol 25 mg b.i.d.  2. Vytorin 10/40 at night.  3. Enalapril 10 mg b.i.d.  4. Lasix 20 mg daily.  5. K-Dur 10 mEq daily.  6. Aspirin 81 mg daily.  7. Coumadin as directed.  8. Fosamax.  9. Glucosamine.  10.Vitamins.  11.Aldactone 25 mg daily.   PHYSICAL EXAMINATION:  VITAL SIGNS:  The blood pressure is 154/80 and  the pulse 70 and regular.  NECK:  There was no venous distention.  The carotid pulses were full  without bruits.  CHEST:  Clear without rales or rhonchi.  CARDIAC:  Rhythm was regular.  There were no murmurs or gallops.  ABDOMEN:  Soft with normal bowel sounds.  EXTREMITIES:  Peripheral pulses were full with no peripheral edema.   IMPRESSION:  1. Congestive heart failure, class II.  2. Ischemic cardiomyopathy, ejection fraction 20%.  3. Coronary artery disease, status post anterior wall  myocardial      infarction in 2006, treated with a Cypher drug-eluting stent to the      left anterior descending.  4. Status post backup ventricular demand pacing implantable      cardioverter-defibrillator.  5. Hypertension, treated.  6. Hyperlipidemia.  7. Arthritis, lumbar spine and hips.  8. Left ventricular thrombus by echocardiogram in 2007.   RECOMMENDATIONS:  I think, [name] is doing well.  Her blood pressure  is up some, so I think we can uptitrate her enalapril.  I have increased  it from 10 b.i.d. to 15 b.i.d.  I will get a BMP and CBC today and I  will get a BMP in another week and I will see her back in 3 months.     Bruce Elvera Lennox Juanda Chance, MD, [facility]  Electronically Signed    BRB/MedQ  DD: 11/10/2007  DT: 11/11/2007  Job #: [id number]

## 2010-08-06 NOTE — H&P (Signed)
NAMEREATHEL, TURI                ACCOUNT NO.:  [id number]   MEDICAL RECORD NO.:  [id number]          PATIENT TYPE:  EMS   LOCATION:  MAJO                         FACILITY:  [facility]   PHYSICIAN:  Doylene Canning. Ladona Ridgel, MD    DATE OF BIRTH:  Oct 29, 1925   DATE OF ADMISSION:  05/29/2007  DATE OF DISCHARGE:                              HISTORY & PHYSICAL   ADMITTING DIAGNOSIS:  Fractured defibrillator lead.   HISTORY OF PRESENT ILLNESS:  The patient is a very pleasant 77 year old  woman with longstanding ischemic cardiomyopathy and congestive heart  failure class II, EF 20%, status post anterior MI in the past.  She had  a prophylactic defibrillator placed with a Medtronic Sprint Fidelis lead  in 2006.  The patient has been stable and has received no intercurrent  ICD therapies.  I saw her back in the office in December.  At that time  she was doing well and had no specific complaints.  The patient's device  was interrogated, and there were no abnormalities noted.  She was in her  usual state of health until today when she noted that her defibrillator  beeping sound was heard, and she subsequently presented to the emergency  department.  Interrogation of her device has demonstrated R waves of 16  and impedance of 424 and a threshold of a volt at 0.2 milliseconds, all  within her usual numbers.  Her HVB and SVC defibrillator coil impedances  were also stable.  However, the patient had over 3200 closely coupled RR  intervals consistent with fractured lead.  She has not received any  defibrillator shared therapies for this.  She has a clear indication now  for implantation of a new defibrillator lead.  The patient has had no  ICD shocks and otherwise feels well.  Her past medical history is also  notable for prior LV apical aneurysm, and for this she is on Coumadin  therapy.   MEDICATIONS:  1. Vytorin 10/40 a day.  2. Fosamax 70 mg weekly.  3. Enalapril 10 b.i.d.  4. Lasix 20 mg daily.  5.  Potassium 10 mEq daily.  6. Aspirin 81 a day.  7. Coumadin as directed.  8. Aldactone 25 a day.  9. Multiple vitamins.   FAMILY HISTORY:  Noncontributory at her advanced age.   SOCIAL HISTORY:  The patient is widowed.  She denies tobacco or ethanol  abuse.   REVIEW OF SYSTEMS:  Really negative except as noted above.  She has  class II heart failure symptoms but otherwise has been stable.  Otherwise all systems are reviewed and negative except for very mild  arthritis.   PHYSICAL EXAMINATION:  She is a pleasant, well-appearing, elderly woman  in no acute distress.  Blood pressure was 125/70, the pulse was 60 and  regular, respirations were 18.  Weight was not recorded.  HEENT: Normocephalic and atraumatic.  Pupils are equal and round.  The  oropharynx is moist.  The sclerae are anicteric.  Trachea was midline.  The carotids are  2+ and symmetric.  LUNGS:  Clear bilaterally to auscultation.  No wheezes, rales, or  rhonchi are present.  There is no increased work of breathing.  CARDIOVASCULAR EXAM:  Regular rate and rhythm with normal S1 and S2.  There are no murmurs, rubs, or gallops present.  The PMI was enlarged  and was laterally displaced.  ABDOMINAL EXAM:  Soft, nontender, nondistended.  There is no  organomegaly.  The bowel sounds are present.  There is no rebound or  guarding.  EXTREMITIES:  No cyanosis, clubbing, or edema.  Pulses are 2+ and  symmetric.  NEUROLOGIC:  Alert and oriented x3 with cranial nerves intact.  Strength  is 5/5 and symmetric.   IMPRESSION:  1. Implantable cardioverter-defibrillator lead dysfunction with early      evidence of lead fracture.  2. Ischemic cardiomyopathy, ejection fraction 20%.  3. Congestive heart failure class II.  4. Hypertension.   DISCUSSION:  Overall, Ms. Shampine is stable, but her defibrillator lead has  malfunctioned and will need to be replaced with a new lead.  I will  admit the patient to the hospital and let her Coumadin  wear off, start  heparin as needed, and plan ICD lead revision in the next several days.      Doylene Canning. Ladona Ridgel, MD  Electronically Signed     GWT/MEDQ  D:  05/29/2007  T:  05/31/2007  Job:  [id number]   cc:   Massie Maroon, MD  Everardo Beals Juanda Chance, MD, [facility]

## 2010-08-06 NOTE — Discharge Summary (Signed)
[name], [name]                ACCOUNT NO.:  [id number]   MEDICAL RECORD NO.:  [id number]          PATIENT TYPE:  INP   LOCATION:  2029                         FACILITY:  [facility]   PHYSICIAN:  Doylene Canning. Ladona Ridgel, MD    DATE OF BIRTH:  1925-10-25   DATE OF ADMISSION:  05/29/2007  DATE OF DISCHARGE:  06/02/2007                               DISCHARGE SUMMARY   PROCEDURES:  1. Implantable cardioverter-defibrillator interrogation.  2. Implantable cardioverter-defibrillator lead revision.   DISCHARGE DIAGNOSES:  1. Implantable cardioverter-defibrillator lead dysfunction/failure.  2. Chronic systolic congestive heart failure.  3. Ischemic cardiomyopathy with an ejection fraction of approximately      20%.  4. Status post anterior myocardial infarction.  5. Status post Medtronic implantable cardioverter-defibrillator      placement.  6. Hyperlipidemia.  7. History of left ventricular apical aneurysms with chronic Coumadin      doses.   Time at discharge 35 minutes.   HOSPITAL COURSE:  [name] is an 77 year old female with a history of  coronary artery disease.  She had an ICD placed in 2006, and recently  her ICD began beeping.  It was interrogated and she was found to have  ICD lead dysfunction.  Her device tachycardia therapies were turned off.  She was admitted on May 29, 2007, for cessation of Coumadin in  preparation for lead revision.   Her Coumadin was held and she was monitored closely.  She went for a  lead revision on June 01, 2007, and a new lead was successfully placed  without complication.  She tolerated the procedure well.   On June 02, 2007, her ICD was interrogated and was found to have normal  device function with no episodes and no changes.  All electrical  parameters were within normal limits.  She was evaluated by Dr. Ladona Ridgel  who felt that she needed 5 days of Keflex in addition to the antibiotics  she was getting in the hospital.  No other medication  changes were made.  She is considered stable for discharge on June 02, 2007, with close  outpatient followup.   ACTIVITY:  Her activity level is to be increased gradually according to  the discharge sheet.   SPECIAL INSTRUCTIONS:  She is to call our office for any problems with  the incision.  She is to weigh herself daily.   DIET:  She is to stick to a low-sodium, heart-healthy diet.   FOLLOW UP:  She has a Coumadin check on Monday, March 16, at 10:15 a.m.  and a wound check at the pacer clinic on March 23, at 9 a.m.Marland Kitchen  She will  see Dr. Ladona Ridgel on June 16, at 9 a.m.  She is to follow up with Dr.  Juanda Chance and Dr. Selena Batten as scheduled.   DISCHARGE MEDICATIONS:  1. Keflex 500 mg b.i.d. x5 days.  2. Vytorin 10/40 daily.  3. Fosamax 70 mg weekly.  4. Enalapril 10 mg b.i.d.  5. Lasix 20 mg a day.  6. Potassium 10 mEq a day.  7. Aspirin 81 mg a day.  8. Coumadin 5  mg 1/2 tablet daily.  INR prior to discharge pending at      time of dictation.  9. Aldactone 25 mg daily.  10.Vitamins as prior to admission.  11.Tylenol p.r.n.Bjorn Loser Barrett, PA-C      Doylene Canning. Ladona Ridgel, MD  Electronically Signed    RB/MEDQ  D:  06/02/2007  T:  06/03/2007  Job:  [id number]   cc:   Everardo Beals. Juanda Chance, MD, [facility]  Massie Maroon, MD

## 2010-08-06 NOTE — Assessment & Plan Note (Signed)
[facility]                            CARDIOLOGY OFFICE NOTE   [name], [name]                       MRN:          [id number]  DATE:03/03/2007                            DOB:          1925/07/04    PRIMARY CARE PHYSICIAN:  Massie Maroon, M.D.   CLINICAL HISTORY:  Ms. Bowring is 77 years old and returns for followup and  management of her coronary heart disease and congestive heart failure.  She had an anterior wall myocardial infarction in 2006 treated with a  Cypher stent to the LAD.  Her ejection fraction was 25%.  She  subsequently had a VVI ICD placed.  She has had serial echocardiograms,  and she has one recently which showed an ejection fraction ranging from  15-25% which was slightly down, and she had a layered thrombus which had  persisted.   Despite the severity of her LV dysfunction, she is functioning pretty  well.  She gets around without much in the way of shortness of breath,  and she has had only one episode of chest pain about 2 months ago.  She  has had no palpitations.   PAST MEDICAL HISTORY:  1. Hypertension.  2. Hyperlipidemia.   CURRENT MEDICATIONS:  Vytorin, Fosamax, enalapril, Coumadin, Lasix, K-  Dur, aspirin, and Coreg.   PHYSICAL EXAMINATION:  VITAL SIGNS:  Blood pressure 161/76, pulse 71 and  regular.  NECK:  There was no venous distension.  The carotid pulses were full  without bruits.  CHEST:  Clear.  CARDIAC:  Rhythm was regular.  I heard no murmurs or gallops.  ABDOMEN:  Soft, normal bowel sounds.  EXTREMITIES:  There was no peripheral edema.  The pedal pulses were  full.   IMPRESSION:  1. Coronary artery disease status post anterior wall myocardial      infarction in 2006 treated with a Cypher stent to the left anterior      descending.  2. Ischemic cardiomyopathy with an ejection fraction of 15-25%.  3. Class II-III congestive heart failure, now euvolemic.  4. Status post implantable  cardioverter-defibrillator (VVI backup).  5. Left ventricular mural thrombus by echocardiography.  6. Hypertension.   RECOMMENDATIONS:  Despite the severity of Ms. Nippert's left ventricular  dysfunction, she appears to be doing reasonably well.  She appears to be  euvolemic.  Her blood pressure is high today.  We certainly will need to  get that down, and we need to titrate her medications to try and  optimize her therapy.  We will increase her enalapril from 5 b.i.d. to  10 b.i.d. today.  I am going to have her seen in the Heart Failure  Clinic every 2 weeks to titrate her medications, to get her on optimal  doses of enalapril and Coreg, and I would also like to get her on  Aldactone or the equivalent.  I will ask our Heart Failure Clinic to do  this.  I will then plan to see her back in about 2 months in followup.  We will plan to continue the Coumadin because of the severity of  her LV  dysfunction and the mural thrombus.     Bruce Elvera Lennox Juanda Chance, MD, [facility]  Electronically Signed    BRB/MedQ  DD: 03/03/2007  DT: 03/04/2007  Job #: [id number]

## 2010-08-06 NOTE — Assessment & Plan Note (Signed)
[facility]                            CARDIOLOGY OFFICE NOTE   NAME:[name], [name]                       MRN:          [id number]  DATE:05/03/2007                            DOB:          03-05-1926    PRIMARY CARE PHYSICIAN:  Dr. Pearson Grippe.   CLINICAL HISTORY:  [name] is 77 years old and returns for follow-up  management of her coronary heart disease and congestive heart failure.  When I saw her last time, her ejection fraction had decreased slightly  from 25% to 15 to 25%.  Despite this, she has is very functional and is  probably class II to maybe class III heart failure.  Because of the fall  in ejection fraction, we sent her to [facility] for further titration of  her medications.  We found that she was already on the optimal dose of  Coreg at 25 twice a day and she added aldactone.  She subsequently had a  BNP done a week later which showed a potassium of 4.1 and BUN and  creatinine of 21 and 0.7.   PAST MEDICAL HISTORY:  Significant for hypertension and hyperlipidemia.   CURRENT MEDICATIONS:  1. Carvedilol 25 b.i.d.  2. Vytorin 10/40.  3. Enalapril 10 b.i.d.  4. Lasix 20 daily.  5. K-Dur 20 mEq daily.  6. Aspirin 81 mg daily.  7. Coumadin.  8. Fosamax.  9. Glucosamine.  10.Multivitamins.  11.Aldactone 25 mg daily.   PHYSICAL EXAMINATION:  Her blood pressure was 134/79 and the pulse 68  and regular.  There was no venous tension.  The carotid pulses were full  without bruits.  CHEST:  Clear.  CARDIAC:  Rhythm was regular.  There was a short systolic murmur at he  left sternal edge.  ABDOMEN:  Soft with normal bowel sounds. There was no  hepatosplenomegaly.  Peripheral pulses were full and there was no peripheral edema.   IMPRESSION:  1. Congestive heart failure secondary to systolic dysfunction, class 2-      3 with ejection fraction of 15-25%  2. Coronary artery status post anterior wall myocardial infarction in      2006  treated with a Cypher stent to the left anterior descending. .  3. Hypertension.  4. Hyperlipidemia.  5. History of kidney stones.  6. Chronic arthritis.  7. Left ventricular mural thrombus by echo in 2007.  8. Status post implantable cardioverter-defibrillator.   RECOMMENDATIONS:  I think [name] is doing well.  She is now on  Aldactone. Her potassium was okay to start being on low supplemental  potassium.  We will plan to recheck her BNP which she returns to the  Coumadin clinic and heart failure clinic in 2 weeks and I will  plan to  see her back in 3 months.  Will check her device today.     Bruce Elvera Lennox Juanda Chance, MD, [facility]  Electronically Signed    BRB/MedQ  DD: 05/03/2007  DT: 05/04/2007  Job #: [id number]

## 2010-08-06 NOTE — Assessment & Plan Note (Signed)
[facility]                          CHRONIC HEART FAILURE NOTE   NAME:[name], [name] CORVERA                       MRN:          [id number]  DATE:11/30/2007                            DOB:          July 15, 1925    PRIMARY CARE:  Massie Maroon, MD   PRIMARY CARDIOLOGIST:  Everardo Beals. Juanda Chance, MD, FACC   ELECTROPHYSIOLOGY:  Doylene Canning. Ladona Ridgel, MD   [name] returns today for followup of her congestive heart failure  which is secondary to ischemic cardiomyopathy with EF 15-20% status post  recent echocardiogram without much improvement.  [name] underwent  implantation of a Medtronic maximal ICD recently.  She has done quite  well.  She was scheduled to follow up with Dr. Ladona Ridgel in 5 months.  [name] also recently saw Dr. Juanda Chance for routine cardiology visit at which  time her blood pressure was noted to be mildly elevated.  He increased  her enalapril to 15 mg b.i.d. and check blood work.  Blood work also  repeated on November 19, 2007, showed a potassium of 4.4, BUN and  creatinine 28 and 0.8.  [name] states she has been doing well.  She  continues to walk at the local [employer] in [place] about 3 times a week  and she remains fairly active at home doing her own housework, cleaning,  cooking, denies any symptoms suggestive of volume overload, denies any  presyncope, syncopal episodes, palpitations, or chest discomfort.  States compliance with medications and diet.   PAST MEDICAL HISTORY:  1. Congestive heart failure secondary to ischemic cardiomyopathy with      EF 15-20% status post placement of a Medtronic maximal ICD.  2. Coronary artery disease.  3. Hypertension.  4. Hypercholesteremia.  5. Left ventricular mural thrombus by echocardiogram requiring chronic      anticoagulation therapy.   Review of systems as stated above, otherwise negative.   Current medications include carvedilol 25 mg b.i.d., Vytorin 10/40,  enalapril 15 mg b.i.d., Lasix 20 mg daily,  K-Dur 10 mEq daily, aspirin  81 daily, Coumadin as directed, Fosamax weekly, glucosamine and  chondroitin, multivitamin daily, vitamin D daily, alpha-lipoic acid  daily, Aldactone 25 mg daily, fish oil daily.   PHYSICAL EXAMINATION:  VITAL SIGNS:  Weight 150 pounds, which is down 2  pounds, blood pressure 135/63 and 140/78.  [name] is in no acute  distress.  No signs of jugular vein distention at 45 degree angle.  LUNGS:  Clear to auscultation.  CARDIOVASCULAR:  S1 and S2, regular rate and rhythm.  ABDOMEN:  Soft, nontender, positive bowel sounds.  EXTREMITIES:  Lower extremities without clubbing, cyanosis, or edema.   IMPRESSION:  Congestive heart failure secondary to ischemic  cardiomyopathy without signs of volume overload at this time.  Continue  current medications with the exception of her lisinopril, will increase  it to 20 mg b.i.d. and I will see [name] back in 2 months for follow-  up.      Dorian Pod, ACNP  Electronically Signed      Rollene Rotunda, MD, [facility]  Electronically Signed   MB/MedQ  DD: 11/30/2007  DT: 12/01/2007  Job #: [id number]   cc:   Massie Maroon, MD

## 2010-08-06 NOTE — Assessment & Plan Note (Signed)
[facility]                            CARDIOLOGY OFFICE NOTE   NAME:Aldridge, ALAYCIA EARDLEY                       MRN:          [id number]  DATE:12/07/2007                            DOB:          [birth year]    PRIMARY CARE PHYSICIAN:  Massie Maroon, MD   CLINICAL HISTORY:  Ms. Munley is 77 years old and came in for an  unscheduled visit today when she was in Coumadin Clinic and complained  of some recent chest pain and increased shortness of breath.  She has  had a remote myocardial infarction in 2006 treated with Cypher stent to  LAD and she has severe left ventricular dysfunction with ejection  fraction of 20% which has not improved.  She had a cath a year and a  half ago, which showed a patent stent and nonobstructive disease.   She said she started feeling somewhat weak and tired on Friday and  Saturday and then Saturday night, she had some shortness of breath.  Yesterday, she had some mild chest discomfort lasting 15-20 minutes for  which she took Tums on 2 different occasions.  She came into Coumadin  clinic today and states she had not been feeling as well.   PAST MEDICAL HISTORY:  Significant for hypertension and hyperlipidemia.   CURRENT MEDICATIONS:  1. Carvedilol 25 mg b.i.d.  2. Vytorin.  3. Lasix 20 mg daily.  4. K-Dur 10 mEq daily.  5. Aspirin.  6. Coumadin.  7. Fosamax.  8. Glucosamine.  9. Aldactone 25 mg daily.  10.Enalapril 10 mg 2 tablets b.i.d.   PHYSICAL EXAMINATION:  VITAL SIGNS:  Today, blood pressure 120/80 and  pulse 62 and regular.  NECK:  Venous pulsation questionably visible 1 cm above the clavicle.  CHEST:  Clear without rales or rhonchi.  CARDIAC:  Rhythm is regular.  There are no murmurs or gallops.  ABDOMEN:  Soft with normal bowel sounds.  EXTREMITIES:  There are no peripheral edema.  Pedal pulses are equal.   Electrocardiogram showed an old anterior wall infarction that has not  changed.   IMPRESSION:  1.  Recent chest pain, questionable angina.  2. Increased shortness of breath, question mild volume overload with      chronic systolic heart failure.  3. Coronary artery disease status post anterior wall infarction in      2006 treated with a Cypher drug-eluting stent to left anterior      descending with nonobstructive disease, a catheterization in March      2008.  4. Ischemic cardiomyopathy with an ejection fraction of 20%.  5. Chronic systolic heart failure.  6. Status post backup implantable cardioverter-defibrillator.  7. Hypertension.  8. Hyperlipidemia.  9. Left ventricular thrombus by echocardiogram in 2007, on Coumadin      therapy.   RECOMMENDATIONS:  I am a little concerned about Ms. Alegria symptoms.  Her  symptoms yesterday could have been ischemic, but I am not sure.  She may  have some mild volume overload, but I am not sure of that either.  We  will plan to get a  chest x-ray today as well as CBC, BMP, and BNP.  I  will see back and arrange for her to see Jacolyn Reedy the next week.  To follow up and we will get a BMP on that visit.  I will see her back  in about 3 weeks.     Bruce Elvera Lennox Juanda Chance, MD, [facility]  Electronically Signed    BRB/MedQ  DD: 12/07/2007  DT: 12/08/2007  Job #: [id number]

## 2010-08-06 NOTE — Assessment & Plan Note (Signed)
[facility]                          CHRONIC HEART FAILURE NOTE   NAME:[name], [name]                       MRN:          [id number]  DATE:07/15/2007                            DOB:          [birth year]    PRIMARY CARDIOLOGIST:  Everardo Beals. Juanda Chance, MD, [facility].   PRIMARY CARE PHYSICIAN:  Massie Maroon, MD.   ELECTROPHYSIOLOGIST:  Doylene Canning. Ladona Ridgel, MD.   [name] returns today for followup of her congestive heart failure  which is secondary to ischemic cardiomyopathy with an EF of 15-20%.  She  is status post 2D echocardiogram today; results pending at this time.  [name] also recently underwent implantation of [name] new cardioverter  defibrillator lead secondary to previous lead malfunction.  She has been  doing quite well since she was discharged home in March.  She does  complain of soreness and tenderness around the ICD site but denies any  fever, chills, any firing from her device, any signs suggestive of  volume overload.   PAST MEDICAL HISTORY:  1. Congestive heart failure secondary to ischemic cardiomyopathy with      an EF of 15-20%, status post 2D echocardiogram today, with results      pending at this time.  2. Coronary artery disease status post MI in 2006 with placement of [name]      Cypher drug-eluting stent to the LAD.  3. Hypertension.  4. Hypercholesteremia.  5. Left ventricular mural thrombus by echocardiogram in 2007,      requiring chronic anticoagulation therapy.  6. Status post placement of an ICD device by Dr. Lewayne Bunting.  The      patient has [name] Medtronic Maximo device.  7. Obesity.   REVIEW OF SYSTEMS:  As stated above, otherwise negative.   CURRENT MEDICATIONS:  Include carvedilol 25 mg b.i.d., Vytorin 10/40  daily, enalapril 10 b.i.d., Lasix 20 daily, K-Dur 10 daily, aspirin 81  daily, Coumadin per Coumadin Clinic, Fosamax weekly, glucosamine and  chondroitin daily, multivitamin daily, vitamin D daily, alpha lipoic  acid daily,  Aldactone 25 mg daily.  P.r.n. medications include  nitroglycerin, MiraLax and Tylenol.   PHYSICAL EXAM:  Weight 150 pounds, blood pressure 120/73, heart rate 57.  [name] is in no acute distress.  No signs of jugular vein distention at 45-degree angle.  Lungs are clear to auscultation bilaterally.  CARDIOVASCULAR EXAM:  Reveals an S1 and S2.  Regular rate and rhythm.  CHEST:  Left chest ICD site well-granulated incision without redness or  drainage noted.  ABDOMEN:  Soft, nontender, positive bowel sounds.  LOWER EXTREMITIES:  Without clubbing, cyanosis or edema.  NEUROLOGICAL:  Alert and oriented x3.   IMPRESSION:  Congestive heart failure secondary to ischemic  cardiomyopathy with EF previously 20%.  Results of today's echo pending.  The patient maintaining class II symptoms.  Continue current  medications.  See the patient back in 2 months.      Dorian Pod, ACNP  Electronically Signed      Rollene Rotunda, MD, [facility]  Electronically Signed   MB/MedQ  DD: 07/15/2007  DT: 07/15/2007  Job #: [id number]

## 2010-08-06 NOTE — Assessment & Plan Note (Signed)
[name]                          CHRONIC HEART FAILURE NOTE   NAME:[name], [name]                       MRN:          [id number]  DATE:03/23/2007                            DOB:          Aug 24, 1925    PRIMARY CARE PHYSICIAN:  Is Dr. Pearson [name].   PRIMARY CARDIOLOGIST:  Dr. Charlies [name].   EP:  Dr. Lewayne [name].   [name] is a delightful 77 year old Caucasian female followed closely  by Dr. Charlies [name] with known history of coronary artery disease and  congestive heart failure.  She has been referred to the heart failure  clinic by Dr. Juanda [name] for further titration and management of her  congestive heart failure which is secondary to ischemic cardiomyopathy,  with an ejection fraction 15-25%.  [name] suffered a myocardial  infarction in 2006 that was treated with a CYPHER stent to the LAD.  Her  ejection fraction at that time was found to be 25%.  She subsequently  had a VVI ICD placed.  She has had several echocardiograms done, most  recent echocardiogram done December 2 of this year shows an ejection  fraction of 15-25%.  At this time findings were suggestive of a layered  non mobile thrombus along the apical wall of the left ventricle also.  [name] states she is very interested in doing all she can to improve  her heart failure and she is very receptive to any  education/information.  She is a widow.  She lives alone in [place] in  her home.  She has adult children that live close by.  She has never  used tobacco.   PAST MEDICAL HISTORY:  Somewhat limited.  She underwent a hysterectomy  many years ago.  Just recently was diagnosed with coronary artery  disease and ischemic cardiomyopathy.  She states she does have  hypertension and her cholesterol has been high.  Majority of her family  members passed away from cancer, although she states her father did have  a stroke.  [name] is somewhat limited her activity secondary to severe  arthritic changes in her back and hips.  She tries to walk every other  day at [facility] but has been very limited in any activity secondary to  ongoing pain in her hips and she does not like to take narcotics.  She  states she has been using Extra Strength Tylenol for relief.  This  generally can make the discomfort bearable.  Apparently has been  evaluated by doctors in the past and told that she there is no surgical  repair that can be done for any of her back discomfort.  She has had  injections in her knees before for the arthritic pain.  She states she  is able to walk up steps but has to move very gingerly.  She does very  light housekeeping maintained her on home dose her own cooking, although  she states she does not do any much of anything.  Overall does have a  very sedentary lifestyle.  She states compliance with her medications.  She has her Coumadin  checked here in the Coumadin Clinic.   PAST MEDICAL HISTORY:  1. Congestive heart failure secondary to ischemic cardiomyopathy with      EF 15-25% by recent echocardiogram.  2. Coronary artery disease status post anterior wall myocardial      infarction and 2006 with placement of a CYPHER drug-eluting stent      to the LAD.  3. Hypertension.  4. Hyperlipidemia.  5. History of kidney stones.  6. Extreme deconditioning secondary to arthritic changes in back and      hips.  7. Chronic pain secondary to arthritis.  8. A left ventricular mural thrombus detected by echocardiogram in      2007.  9. Status post placement of a VVI ICD followed by Dr. Lewayne [name].   REVIEW OF SYSTEMS:  Is as stated above.   ALLERGIES:  Include CODEINE.  Intolerance to ALEVE and IBUPROFEN.   MEDICATIONS:  1. Vytorin 10/40.  2. Multivitamin.  3. Alpha lipoic acid 1000 mg daily.  4. Fosamax 70 mg weekly.  5. Glucosamine chondroitin daily.  6. Coumadin per Coumadin Clinic.  7. Lasix 20 mg daily.  8. K-Dur 10 mEq daily.  9. Aspirin 81 mg daily.   10.Coreg 12.5 mg b.i.d.  11.Enalapril 10 mg b.i.d.   P.R.N MEDICATIONS:  Include a nitroglycerin, MiraLax, oxycodone p.r.n.  Tylenol Extra Strength.   PHYSICAL EXAM:  Weight is 143 pounds, blood pressure 157/80 with a heart  rate of 62.  [name] is in no acute distress.  No signs of jugular vein distention  at 45 to the angle.  Lungs are clear to auscultation bilaterally.  Cardiovascular exam reveals a S1-S2, regular rate and rhythm.  ABDOMEN:  Soft, nontender, positive bowel sounds.  LOWER EXTREMITIES:  Without clubbing, cyanosis or edema.   IMPRESSION:  Congestive heart failure secondary to ischemic  cardiomyopathy with an EF 15-25%.  Will increase carvedilol to 18.375 mg  b.i.d. over the next few days and see the patient  back in 2 weeks.  [name] states she cannot return in 2 weeks that it  will be at the end of January before she can come back.  I will see her  at that time.      [name], ACNP  Electronically Signed      [name] Rotunda, MD, [facility]  Electronically Signed   MB/MedQ  DD: 03/23/2007  DT: 03/23/2007  Job #: [id number]   cc:   [name] Maroon, MD

## 2010-08-06 NOTE — Assessment & Plan Note (Signed)
[facility]                            CARDIOLOGY OFFICE NOTE   NAME:[name], [name] ZEIMET                       MRN:          [id number]  DATE:                                      DOB:          17-Aug-1925    ADDENDUM   We interrogated her defibrillator today and she has a backup VVI with  good threshold and  no tachy arrhythmias. She will be followed on Care  Link by phone and so will not need an office interrogation until 1 year.     Bruce Elvera Lennox Juanda Chance, MD, [facility]     BRB/MedQ  DD: 08/05/2007  DT: 08/05/2007  Job #: [id number]

## 2010-08-06 NOTE — Assessment & Plan Note (Signed)
[facility]                            CARDIOLOGY OFFICE NOTE   [name], [name]                       MRN:          [id number]  DATE:04/05/2008                            DOB:          [birth year]    PRIMARY CARE PHYSICIAN:  [name] Maroon, MD   CLINICAL HISTORY:  [name] returns for followup management of her  coronary heart disease and congestive heart failure.  She is 77 years  old and suffered an anterior wall myocardial infarction in 2006 treated  with a Cypher stent.  Her ejection fraction has been in the range of 20%  and really has not recovered.  She also has a layered apical thrombus  for which she is taking chronic Coumadin therapy.   She had recent been having problems with heart failure, but more  recently has done better.  She does not feel like she has had any fluid  or any increasing shortness of breath or any change in exercise  tolerance.  She has had no chest pain or palpitations.   PAST MEDICAL HISTORY:  Significant for hypertension, hyperlipidemia.   CURRENT MEDICATIONS:  1. Carvedilol 25 mg b.i.d.  2. Vytorin 10/40 daily.  3. Lasix 40 mg daily.  4. K-Dur 10 mEq daily.  5. Potassium 81 mg daily.  6. Coumadin.  7. Fosamax.  8. Glucosamine.  9. Vitamins.  10.Aldactone 25 mg daily.  11.Enalapril 20 mg b.i.d..   PHYSICAL EXAMINATION:  VITAL SIGNS:  Today, the blood pressure was  139/78 and the pulse 61 and regular.  NECK:  There was no venous distention.  The carotid pulses were full  without bruits.  CHEST:  Clear.  CARDIAC:  Rhythm was regular.  I could hear no murmurs or gallops.  ABDOMEN:  Soft with normal bowel sounds.  There is no  hepatosplenomegaly.  EXTREMITIES:  Peripheral pulses are equal and there is no peripheral  edema.   IMPRESSION:  1. Coronary artery disease status post anterior wall myocardial      infarction 2006 treated with a Cypher drug-eluting stent to left      anterior descending.  2.  Ischemic cardiomyopathy, ejection fraction 20%.  3. Systolic heart failure, euvolemic class III.  4. Status post backup ventricular demand pacing implantable      cardioverter-defibrillator.  5. Hypertension.  6. Hyperlipidemia.  7. Left ventricular thrombus on Coumadin therapy.   RECOMMENDATIONS:  I think, [name] is doing well at present.  She  appears euvolemic and her exercise tolerance has stabilized.  We will  plan to continue the same medications.  She is worried about diabetes  and we will plan to get a fasting lipid, the first CBC, BMP, BNP, and  hemoglobin A1c.  I will plan to back in 3 months and we will get an echo  prior to that visit.     Bruce Elvera Lennox Juanda Chance, MD, [facility]  Electronically Signed    BRB/MedQ  DD: 04/05/2008  DT: 04/05/2008  Job #: [id number]

## 2010-08-06 NOTE — Assessment & Plan Note (Signed)
[facility]                            CARDIOLOGY OFFICE NOTE   MINH, JASPER                       MRN:          [id number]  DATE:08/05/2007                            DOB:          07/15/1925    PRIMARY CARE PHYSICIAN:  Massie Maroon, MD.   CLINICAL HISTORY:  Ms. Bonaparte returned for followup management of her  congestive heart failure.  She had an anterior wall myocardial  infarction 2006 treated with a Cypher stent to the LAD and was left with  significant left ventricular dysfunction.  Her ejection fraction was  about 25% and then on a recent echo decreased to 15-25%.  She was seen  in the Heart Failure Clinic and we titrated her medicines and added  Aldactone.  Her follow up ejection fraction was about the same at 20%.   She also has a backup VVI/ICD in place.  She had a dysfunctional lead  and came into the hospital earlier this year for placement of a new  lead.  She says she has been doing fairly well.  She works in the garden  and works outside some.  She has shortness of breath with moderate  activity, but with her current limitation, she has a little shortness of  breath.  She has had no chest pain or palpitations.   PAST MEDICAL HISTORY:  Significant for hypertension and hyperlipidemia.   CURRENT MEDICATIONS:  1. Carvedilol 25 mg b.i.d.  2. Enalapril 10 mg b.i.d.  3. Aldactone 25 mg daily.  4. Vytorin 10/40 daily.  5. Lasix 20 daily.  6. K-Dur 10 mEq daily.  7. Aspirin 81 mg.  8. Coumadin as directed.  9. Fosamax.  10.Glucosamine.   PHYSICAL EXAMINATION:  VITAL SIGNS:  Blood pressure was 127/73, the  pulse was 63 and regular.  NECK:  There was no venous distension.  The carotid pulses were full  without bruits.  CHEST:  Clear without rales or rhonchi.  HEART:  Rhythm was regular.  I could hear no murmurs or gallops.  ABDOMEN:  Soft with normal bowel sounds.  EXTREMITIES:  The femoral pulses were equal.  There was no  peripheral  edema.   DIAGNOSTICS:  Electrocardiogram showed left axis deviation and evidence  of an old anterior infarction.   IMPRESSION:  1. Congestive heart failure class II to III with ejection fraction      20%.  2. Coronary artery disease status post antral wall myocardial      infarction 2006 treated with a Cypher stent to the left anterior      descending.  3. Status post backup VVI implantable cardioverter-defibrillator and      status post recent new lead insertion for dysfunctional      defibrillation lead.  4. Hypertension.  5. Hyperlipidemia.  6. 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  7. Left medial thrombus by echo 2007.   RECOMMENDATIONS:  Despite the fact that Ms. Rotert's left ventricle  ejection fraction has remained low, she has functionally done quite  well.  She appears euvolemic and well compensated, and appears to  be on  optimal medications.  We will check her defibrillators today and then we  will plan to see her back in followup in 3 months.   ADDENDUM:  We interrogated her defibrillator today and she has a backup VVI with  good threshold and  no tachy arrhythmias. She will be followed on Care  Link by phone and so will not need an office interrogation until 1 year.     Bruce Elvera Lennox Juanda Chance, MD, [facility]  Electronically Signed    BRB/MedQ  DD: 08/05/2007  DT: 08/05/2007  Job #: [id number]

## 2010-08-06 NOTE — Assessment & Plan Note (Signed)
[facility]                          CHRONIC HEART FAILURE NOTE   NAME:[name], [name]                       MRN:          [id number]  DATE:08/30/2007                            DOB:          09/16/1925    PRIMARY CARE:  Massie Maroon, MD   PRIMARY CARDIOLOGIST:  Everardo Beals. Juanda Chance, MD, FACC   ELECTROPHYSIOLOGY:  Doylene Canning. Ladona Ridgel, MD   Ms. Harshman returns today for followup of her congestive heart failure,  which is secondary to ischemic cardiomyopathy with EF 15%-20%, status  post 2-D echocardiogram without improvement in ejection fraction at this  time.  Ms. Oertel has been quite well since I last saw here in April.  She  is followed up with Dr. Juanda Chance for routine cardiology visit since that  time.  Ms. Ayars continues to do quite well in her activity of daily  living.  She complains of some arthritic pain in her hips and lower  back.  However, this does not keep her from remaining active.  She  continues to walk with [employer] approximately 2-3 times a week and  has done some light gardening this summer.  She complains of having no  energy, but then describes all the activities that she does during the  day.  She states that she feels like she should still be as active as  she was 30 years ago.  She is now 77 years old.   PAST MEDICAL HISTORY:  1. Congestive heart failure secondary to ischemic cardiomyopathy with      an EF of 15%-20%.  2. Coronary artery disease.  Cypher drug-eluting stent to the LAD.  3. Hypertension.  4. Hypercholesteremia.  5. Left ventricular mural thrombus by echocardiogram requiring chronic      anticoagulation therapy.  6. Status post placement of a Medtronic Maximo ICD.  7. Obesity.   REVIEW OF SYSTEMS:  As stated above.  Otherwise, negative.   CURRENT MEDICATIONS:  1. Carvedilol 25 mg b.i.d.  2. Vytorin 10/40 mg q.h.s.  3. Enalapril 2 mg b.i.d.  4. Lasix 20 mg daily.  5. K-Dur 10 mEq daily.  6. Aspirin 81 mg  daily.  7. Coumadin as directed.  8. Fosamax weekly.  9. Glucosamine and chondroitin daily.  10.Multivitamin daily.  11.Vitamin D daily.  12.Alpha-lipoic acid daily.  13.Aldactone 25 mg daily.  14.Fish oil 2 capsules daily.   Most recent lab work, Aug 05, 2007, potassium 4.1, BUN and creatinine 26  and 0.7, and BNP 337.   PHYSICAL EXAM:  VITAL SIGNS:  Weight 149 pounds, blood pressure 130/60,  and heart rate 57.  GENERAL:  Ms. Roark is in no acute distress.  NECK:  No signs of jugular venous distention at 45-degree angle.  LUNGS:  Clear to auscultation bilaterally.  CARDIOVASCULAR:  S1 and S2.  Regular rate and rhythm.  ABDOMEN:  Soft, nontender, and positive bowel sounds.  LOWER EXTREMITIES:  Without clubbing, cyanosis, or edema.   IMPRESSION:  Congestive heart failure, class II, with an ejection  fraction of 20% without signs of volume overload at this time.  We will  continue current medications and see Ms. Shetterly back in 2 months or sooner  if she has any problems.      Dorian Pod, ACNP  Electronically Signed      Bevelyn Buckles. Bensimhon, MD  Electronically Signed   MB/MedQ  DD: 08/30/2007  DT: 08/31/2007  Job #: [id number]   cc:   Massie Maroon, MD

## 2010-08-06 NOTE — Assessment & Plan Note (Signed)
[facility]                          CHRONIC HEART FAILURE NOTE   NAME:[name], [name]                       MRN:          [id number]  DATE:04/20/2007                            DOB:          [birth year]    PRIMARY CARE PHYSICIAN:  Dr. Pearson Grippe.   PRIMARY CARDIOLOGIST:  Dr. Charlies Constable.   Ms. Ponti returns today for follow-up of her congestive heart failure  which is secondary to ischemic cardiomyopathy.  Most recent  echocardiogram done in December showed EF 15-20% essentially no  improvement from an echocardiogram obtained December 2007.  I recently  saw the patient as a new patient referred here by Dr. Charlies Constable her  primary cardiologist for further titration of her medications.  When I  saw the patient is a new patient last month and was given, I was given  the impression she was on carvedilol 12.5 mg b.i.d. as was documented in  the chart.  The patient had not brought her medications with her at that  time.  However, today she brings her bottle of medicine.  She is  actually been on 25 mg p.o. b.i.d. of carvedilol since last summer.  Also confirmed that her enalapril 10 mg b.i.d.  The patient continues to  do quite well.  She walks 1 mile every other day in the [place] home.  She denies any dyspnea or so shortness of breath with  exertion.  She complains of mild ankle edema after standing on her feet  for long periods of time.  Weight has been stable at home.  Denies any  chest discomfort, orthopnea or PND, presyncope or syncopal episodes.   PAST MEDICAL HISTORY:  1. Congestive heart failure secondary to ischemic cardiomyopathy with      EF currently 15-20%  2. Coronary artery disease status post MI in 2006 with a CYPHER drug-      eluting stent to the LAD.  3. Hypertension.  4. Hypercholesteremia.  5. Limited range of motion secondary to arthritic changes and back and      hips.  6. Left ventricular mural thrombus to  take by echocardiogram and 2007      requiring chronic anticoagulation therapy.  7. Status post placement of a DDI ICD followed by Dr. Lewayne Bunting.  8. History of kidney stones.   REVIEW OF SYSTEMS:  As stated above.   Current medications include carvedilol 25 mg b.i.d. Vytorin 10/40  q.h.s., enalapril 10 mg b.i.d., Lasix 20 mg daily, K-Dur 10 mEq daily,  aspirin 81 daily, Coumadin per Coumadin Clinic, Fosamax 70 mg weekly,  glucosamine chondroitin daily, multivitamin daily.   PHYSICAL EXAM:  Weight 145, blood pressure 167/74 with heart rate of 58.  The patient is in no acute distress.  Signs of jugular vein distention at 45 degrees angle.  LUNGS:  Clear to auscultation bilaterally.  Cardiovascular exam reveals S1-S2 regular rate and rhythm.  ABDOMEN:  Soft, nontender, positive bowel sounds.  LOWER EXTREMITIES:  Without clubbing, cyanosis, no signs of edema at  this time.  NEUROLOGICALLY:  Alert and oriented x3.  Ambulating clinic without  assistance.   IMPRESSION:  Congestive heart failure secondary to ischemic  cardiomyopathy with ejection fraction 15-20% without signs of volume  overload.  The patient already on 25 mg of carvedilol b.i.d. We will add  spironolactone 25 mg daily and see the patient back in 1 month.      Dorian Pod, ACNP  Electronically Signed      Rollene Rotunda, MD, [facility]  Electronically Signed   MB/MedQ  DD: 04/20/2007  DT: 04/20/2007  Job #: [id number]   cc:   Massie Maroon, MD

## 2010-08-09 NOTE — Assessment & Plan Note (Signed)
[name] [name]                            CARDIOLOGY OFFICE NOTE   NAME:[name] [name] [name]                       MRN:          [id number]  DATE:06/09/2006                            DOB:          [birth year]    This is a patient of Dr. Charlies Constable and Dr. Dimas Alexandria.  I am  not sure who her primary will be now, since he is retired.   This is a 77 year old widowed white female patient with a history of  coronary artery disease, status post acute anterior wall MI in 2006 with  Cypher drug-eluting stent to the LAD.  She also has a Medtronic  implantable cardioverter defibrillator and has been on Coumadin for an  apical thrombus since December, 2007.  Patient presented to the hospital  on March 2nd with indigestion-like sensation and tightness in her chest,  unrelieved with nitroglycerin.  At the hospital, her EKG showed  worsening ST elevation and T wave inversion.  She was admitted and ruled  out for an MI with negative enzymes.  Myoview showed an ejection  fraction of 22% with large prior infarction involving the distal half to  two/thirds of the ventricle without evidence of significant ischemia.  Despite this finding, Dr. Juanda [name] still felt she needed to undergo  cardiac catheterization, so Coumadin was held.  The cath showed a patent  stent to the LAD with 50% narrowing of the first diagonal, no  obstruction of the circumflex, and 30% distal RCA.  Dr. Juanda [name] did not  feel her symptoms were related to ischemia and that they may have been  related to her recently taking Aleve for knee pain.   Since the patient has been home, she has done quite well.  She denies  any recurrent chest pain.  She did stop taking Aleve.  She said there  was medication she was given to prevent kidney stones, and when she took  this, she had some indigestion symptoms and stopped it immediately.  She  has since conferred with [name] [name] of the Coumadin clinic, who is  looking into what this medication is and the safety of her taking with  her other cardiac meds.   CURRENT MEDICATIONS:  1. Vytorin 10/40 mg daily.  2. Multivitamin daily.  3. Alpha lipoic acid 100 mg daily.  4. Fosamax 70 mg weekly.  5. Glucosamine chondroitin daily.  6. Enalapril 5 mg b.i.d.  7. Citrucel daily.  8. Coumadin as directed.  9. __________  daily.  10.Lasix 20 mg daily.  11.K-Dur 20 mEq daily.  12.Coated aspirin 81 mg daily.  13.Coreg 12.5 mg b.i.d.   PHYSICAL EXAMINATION:  GENERAL:  This is a pleasant, young-looking 4-  year-old white female in no acute distress.  VITAL SIGNS:  Blood pressure 116/72, pulse 58.  Weight 133.  NECK:  Without JVD, HJR.  A question of a right carotid bruit.  LUNGS:  Clear anterior, posterior, and lateral.  HEART:  Regular rate and rhythm at 60 beats per minute.  Normal S1 and  S2 with a 1/6 systolic ejection murmur at the left sternal  border.  Quiet heart sounds.  ABDOMEN:  Soft without organomegaly, masses, lesions, or abdominal  tenderness.  The right groin without hematoma or hemorrhage.  EXTREMITIES:  Without clubbing, cyanosis or edema.  She has good distal  pulses.   EKG:  Sinus bradycardia at 58 beats per minute.  Left anterior  fascicular block.  Old anterolateral MI.  She does have significant T  wave inversion in lead III through V6, as she did in the hospital, but  this has changed from her prior EKG done here in August, 2007.  It is  more prominent.   IMPRESSION:  1. Chest pain, felt not to be ischemic and possibly secondary to the      Aleve she was taking for her arthritis.  2. Nonobstructive coronary artery disease, on cath on May 28, 2006      with a patent Cypher stent to the left anterior descending artery      and nonobstructive disease.  3. Ischemic cardiomyopathy, ejection fraction 25%.  4. Status post Medtronic implantable cardioverter defibrillator      placement with [employer] leads.  5. History of  apical thrombus on Coumadin since December, 2007.  6. Hyperlipidemia.  7. Hypertension.  8. Degenerative joint disease.   PLAN:  Patient is doing well from a cardiac standpoint.  Medtronic  defibrillator was checked and is working properly.  She had missed her  call-in because she was in the hospital, but it was checked by [name] [name]  today.  She already has a follow-up appointment to see Dr. Juanda [name] back  in April, which we will keep, and she is to call if she has any further  problems.      [name] Reedy, PA-C  Electronically Signed      [name] [name]. [name] Som, MD, [facility]  Electronically Signed   ML/MedQ  DD: 06/09/2006  DT: 06/09/2006  Job #: [id number]   cc:   [name] Beals. Juanda Chance, MD, [facility]  [name] [name]. [name] [name]., M.D.

## 2010-08-09 NOTE — Assessment & Plan Note (Signed)
[facility]                              CARDIOLOGY OFFICE NOTE   NAME:[name], [name]                       MRN:          [id number]  DATE:10/27/2005                            DOB:          [birth year]    PRIMARY CARE PHYSICIAN:  Janae Bridgeman. Lendell Caprice, M.D.   CLINICAL HISTORY:  [name] is 77 years old and returns for followup  management of her ischemic cardiomyopathy.  She had a large anterior wall  myocardial infarction in February 2006, treated with Cypher stent to the  LAD.  Her left ventricular function did not recover and her ejection  fraction at last echocardiogram in April 2007 was 20-30%.  She underwent  placement of a backup VVI defibrillator.   She has been doing fairly well.  She is not very active and most of her  activities involved in the house.  She has difficulty walking because of a  bad back and she does do some stationary bicycle exercise.   PAST MEDICAL HISTORY:  1.  Hypertension.  2.  Hyperlipidemia.   CURRENT MEDICATIONS:  Vytorin, Plavix, Lasix, K-Dur, aspirin, Fosamax,  glucosamine, enalapril, Citracal, Coreg, aspirin.   PHYSICAL EXAMINATION:  VITAL SIGNS:  Blood pressure 126/66, pulse 59 and  regular.  There was no venous distention.  NECK:  Carotid pulses were full without bruits.  CHEST:  Clear.  CARDIAC:  Rhythm was regular.  No murmurs or gallops.  ABDOMEN:  Soft without organomegaly.  Bowel sounds are normal.  EXTREMITIES:  Brisk pulses are full.  No peripheral edema.   LABORATORY DATA:  An ECG showed an old anterior wall infarction.   IMPRESSION:  1.  Coronary artery disease, status post anterior wall myocardial      infarction, February 2006, treated with a Cypher stent to the left      anterior descending.  2.  Ischemic cardiomyopathy and ejection fraction of 25%.  3.  Status post implantable cardioverter defibrillator implantation.  4.  History of hypertension.  5.  History of hyperlipidemia.   RECOMMENDATIONS:  I think [name] is doing well.  Her activities are more  limited now by her back than by her heart.  There is no evidence of volume  overload.  I think we  can cut back  her Lasix from 40 to 20 a day.  Will plan to see her in four months and we  will do an echocardiogram prior to that visit.                               Bruce Elvera Lennox Juanda Chance, MD, [facility]    BRB/MedQ  DD:  10/27/2005  DT:  10/28/2005  Job #:  [id number]

## 2010-08-09 NOTE — Cardiovascular Report (Signed)
[name], PANEK NO.:  [id number]   MEDICAL RECORD NO.:  [id number]          PATIENT TYPE:  INP   LOCATION:  2928                         FACILITY:  [facility]   PHYSICIAN:  Charlies Constable, M.D. [facility] DATE OF BIRTH:  12-16-1925   DATE OF PROCEDURE:  05/17/2004  DATE OF DISCHARGE:                              CARDIAC CATHETERIZATION   CLINICAL HISTORY:  Ms. Tuley is 77 years old and has no prior history of  known heart disease, although she does have hypertension and high  cholesterol.  She developed chest pain at 1 p.m. today and presented to  [facility] in the emergency room very shortly after the onset of chest pain  and was found to have ST elevation on the anterior leads.  She was  transported here for emergency angiography and probable intervention.  Her  daughter, Moishe Spice, is an Primary school teacher nurse here and she has another  daughter, [name], who works in administration with the executive  offices.   PROCEDURE:  The procedure was performed via the right femoral artery using  arterial sheath and 6 French preformed coronary catheters.  A front wall  arterial punch was performed and Omnipaque contrast was used.  After  completion of the diagnostic study, we made decision to proceed with  intervention on the ostial lesion in the left anterior descending artery.  The patient was given Valium and fentanyl and became somewhat sedated and  hypoxic and required a face mask temporarily, but this subsequently  resolved.   The patient was given Angiomax bolus infusion and had been given 300 mg of  Plavix and aspirin in the emergency department as well as heparin.  Her  initial ACT was about 206.  We used a __________ 3.56 [place] guiding  catheter with side holes and a Pensions consultant.  We crossed the lesion  in the ostium of the LAD with the wire without difficulty.  There appeared  to be a filling defect in the proximal LAD which we thought  represented  thrombus, so we went in with a Diver catheter and performed two runs with  aspiration.  We were not certain if we obtained clot.  This resulted in TIMI-  1 flow whereas we had barely TIMI-2 flow after the wire passage.  For this  reason, we did another run with the Diver catheter, performing aspiration,  and then we gave a total of 600 mg of verapamil intracoronary through the  Diver catheter.  This reestablished TIMI-3 flow.  The patient was left with  a tight lesion in the proximal LAD and that part of the vessel was fairly  heavily calcified.  We then went in with a 2.5 x 20 mm Maverick and  performed two inflations.  We had to go up to 16 atmospheres to completely  expand the balloon.  We then deployed a 2.5 x 28 mm Cypher stent,  positioning the proximal edge of the stent right at the ostium of the LAD.  The side of the stent on the circumflex extended just out into the lumen.  We deployed this with one inflation of 14 atmospheres for 30 seconds.  We  then post dilated the 2.75 x 20 mm Quantum Maverick, performing three  inflations of 18 atmospheres for 30 seconds.  Repeat diagnostic studies were  then performed through the guiding catheter.  The patient tolerated the  procedure well and left the laboratory in stable condition.   RESULTS:  Left main coronary artery:  The left main coronary artery was free  of significant disease.   Left anterior descending artery:  The left anterior descending artery was  completed occluded just at the ostium.   Left circumflex artery:  The left circumflex artery gave rise to a ramus  branch, an atrial branch, a small marginal branch and two posterolateral  branches.  These vessels were free of significant disease.   Right coronary artery:  The right coronary artery is a moderately large  vessel.  It gave rise to a right ventricular branch, a posterior descending  branch and three posterolateral branches.  The right coronary artery  was  irregular and there was 30% narrowing distally to the posterior descending  branch.   Left ventriculogram:  The left ventriculogram performed in the RAO  projection showed akinesis of the anterolateral wall and dyskinesis of the  apex.  The estimated ejection fraction was 25-30%.  The aortic pressure was  109/61 with a mean of 84 and left ventricular pressure was 109/14.   Following PTCA and stenting of the ostial lesion in the LAD, the stenosis  improved from 100% to 10% and the flow improved from TIMI-0 to TIMI-3 flow.   CONCLUSION:  The patient had the onset of chest pain at 1300, arrived in the  [facility] Emergency Room at 1313.  She arrived at [facility] at  1528 and reperfusion was established with the Diver catheter at 1608.  This  gave her a total balloon time of 2 hours and 55 minutes from [facility] and a reperfusion time of 3 hours and 8 minutes.   CONCLUSION:  1.  Acute anterior wall myocardial infarction with total occlusion of the      ostium of the left anterior descending artery, no major obstruction of      the circumflex artery, 30% narrowing in the distal right coronary      artery, and anterolateral wall akinesis and apical wall dyskinesis with      an estimated ejection fraction of 25-30%.  2.  Successful reperfusion with aspiration thrombectomy and successful      stenting of the ostial lesion in the proximal left anterior descending      with a drug-eluting Cypher stent, with improvement in sentinel narrowing      from 100% to 10%.   DISPOSITION:  Patient returned to the pulmonary unit for further  observation.  Given her age and the severity of her left ventricular  function her outlook must remain guarded.  She also had dyskinesis of apex  and transient no flow during the procedure.  However, the overall  reperfusion time was somewhat early, so significant recovery is still hoped  for.     BB/MEDQ  D:  05/17/2004  T:  05/19/2004   Job:  [id number]   cc:   Janae Bridgeman. Eloise Harman., M.D.  [address]  [place]  [place] [address]  Fax: [phone]   Charlies Constable, M.D. [facility]   Cardiopulmonary Lab

## 2010-08-09 NOTE — Discharge Summary (Signed)
[name], [name]                ACCOUNT NO.:  [id number]   MEDICAL RECORD NO.:  [id number]          PATIENT TYPE:  INP   LOCATION:  3734                         FACILITY:  [facility]   PHYSICIAN:  Theodore Demark, P.A. LHCDATE OF BIRTH:  1925/03/28   DATE OF ADMISSION:  05/17/2004  DATE OF DISCHARGE:                                 DISCHARGE SUMMARY   PROCEDURES:  1.  Cardiac catheterization.  2.  Coronary arteriogram.  3.  Left ventriculogram.  4.  PTCA and stent to one vessel.  5.  PT/OT evaluation.   DISCHARGE DIAGNOSES:  1.  Acute anterior wall myocardial infarction status post Cypher stent to      the LAD.  2.  Left ventricular function with an EF of 30-40% by echocardiogram this      admission on ACE inhibitor.  3.  Hyperlipidemia on Vytorin.  4.  Hypertension.  5.  Hypokalemia.  6.  Hyperglycemia with a hemoglobin A1C of 5.2.  7.  Urinary tract infection with E. coli.  8.  Weakness, improved with cardiac rehabilitation.  9.  Hypoxia with O2 saturations 87-88% on room air, improved to 92% with      deep breathing in incentive spirometry.   HOSPITAL COURSE:  [name] is a 77 year old female with no known coronary  artery disease.  She went to [facility] after developing chest  discomfort associated with nausea, vomiting and dyspnea.  She had ST  elevation in leads V1-V3 and was treated aggressively and transferred  urgently to the cardiac catheterization lab.  On arrival to the cath lab,  her pain was an 8/10.   The cardiac catheterization showed a total LAD proximally.  This was treated  with PTT and Cypher stent reducing the stenosis to 10%.  She had no other  critical disease with a 30% mid LAD and a 30% distal RCA.  She had an EF of  25-30% at catheterization with apical dyskinesis and anterior akinesis.   A few days after her MI, and echocardiogram was performed, and it showed an  EF of 30-40%.  There was akinesis of the entire periapical wall and  akinesis  of the mid distal septal wall.  Her estimated peak pulmonary artery systolic  pressure was elevated at 42.  There were no significant valvular  abnormalities.   Chest x-ray showed cardiomegaly and bibasilar scarring with minimal  atelectasis but no heart failure or pneumonia.  She was placed on Lasix, ACE  inhibitor and rate blocker for blood pressure control and did well with  these.  She was seen by cardiac rehabilitation.  Initially upon being seen  by cardiac rehabilitation, her room air saturations were 87-88%.  She  practiced purse lip breathing and deep inspiration and used incentive  spirometry.  By May 27, 2004, her O2 saturation was 94% on room air, and  after ambulation to 500 feet, it was 92% on room air.  She was also seen by  PT/OT who stated that she needed Home Health PT/OT, but did not need  inpatient rehabilitation.  A blood gas drawn on  room air showed a pH of  7.449, PCO2 37.6, PO2 62, bicarbonate 26.3.  She is to follow up with her  primary care physician.  Blood sugars during her hospital stay were in the  120's to 140's, but hemoglobin A1C was within normal limits at 5.2.  She is  to follow up with her family physician on this.  Lipid profile showed a  total cholesterol of 139, triglycerides 121, HDL 54, LDL 61.  She is to  continue on the  Vytorin she was taking prior to admission and follow up  with Dr. Lendell Caprice.  A TSH was also checked and was within normal limits at  0.825.   On May 27, 2004, [name] as ambulating without chest pain or shortness of  breath.  She was evaluated by Dr. Juanda Chance and considered stable for discharge  with outpatient follow up arranged.   DISCHARGE INSTRUCTIONS:  1.  Activity:  No driving for five days and no strenuous activity until seen      by M.D.  2.  Diet:  Low-fat, low-salt diet.  3.  Call the office with problems with the cath site.  4.  Follow up with Dr. Lendell Caprice as needed.  5.  Has a PA appointment for Dr.  Juanda Chance on June 14, 2004, at noon.   DISCHARGE MEDICATIONS:  1.  Norvasc is on hold.  2.  She is on another blood pressure medication that she does not remember      the name of, but understands she is not to take this.  3.  Vytorin 10/40 mg daily.  4.  Coreg 12.5 mg b.i.d.  5.  Nitroglycerin sublingual p.r.n.  6.  Plavix 75 mg daily.  7.  Enalapril 5 mg one tablet b.i.d.  8.  Lasix 40 mg daily.  9.  K-Dur 10 mEq daily.  10. Zoloft 75 mg daily.  11. Coated aspirin 325 mg daily.   PLAN:  She is to get a B-Met at her next office visit.      RB/MEDQ  D:  05/27/2004  T:  05/27/2004  Job:  [id number]   cc:   Janae Bridgeman. Eloise Harman., M.D.  [address]  [place]  [place] [address]  Fax: [phone]   Charlies Constable, M.D. [facility]

## 2010-08-09 NOTE — Assessment & Plan Note (Signed)
[facility]                            CARDIOLOGY OFFICE NOTE   NAME:[name], [name] MINAMI                       MRN:          [id number]  DATE:02/23/2006                            DOB:          06/18/1925    PRIMARY CARE PHYSICIAN:  Janae Bridgeman. Lendell Caprice, M.D.   HISTORY:  [name] is 77 years old and had a large anterior wall  myocardial infarction in February 2006 treated with a Cypher stent to  the LAD.  Her LV function did not recover and she was left with an  ejection fraction of 20-30% and subsequently underwent placement of a  backup VVI defibrillator.  She has done fairly well since that time.  Does get short of breath with moderate activity such as making the beds  and if she vacuums too quickly.  She has no chest pain and no  palpitations.   Her Medtronic lead, which is a Sprint Fidelis defibrillation lead, has  had a device alert.  This lead is subject to fractures and she got a  notice about this to address today.   PAST MEDICAL HISTORY:  Significant for hypertension and hyperlipidemia.   CURRENT MEDICATIONS:  Include Vytorin, Plavix, aspirin, enalapril,  Citracal, Coreg, Lasix, K-Dur, and aspirin.   SOCIAL HISTORY:  She lives by herself.  Her daughter is [name] who works in the administrative offices at [employer]   EXAMINATION TODAY:  Blood pressure is 116/60 and the pulse 63 and  regular.  There was no venous distention.  The carotid pulses were full  without bruits.  Chest was clear.  Cardiac rhythm was regular.  I could  hear no murmurs or gallops.  The abdomen was soft with normal bowel  sounds.  There is no hepatosplenomegaly.  The peripheral pulses were  full and there was no peripheral edema.   IMPRESSION:  1. Coronary artery disease status post anterior wall myocardial      infarction in February 2006 treated with a Cypher stent to the left      anterior descending artery.  2. Ischemic cardiomyopathy, ejection  fraction 25%.  3. Class II to III congestive heart failure.  4. Status post implantable cardioverter defibrillator implantation      prophylactic.  5. History of hypertension.  6. History of hyperlipidemia.  7. Lead alert with the Sprint Fidelis defibrillation lead for her ICD.   RECOMMENDATIONS:  I think [name] is doing well from a cardiac  standpoint.  I have told her she could take generic Coreg.  We will plan  to get a BMP today and see if she needs to remain on the potassium.  She  is only on 20 mg of Lasix and she is on 10 of potassium a day and she is  on an ACE inhibitor.  We will interrogate her lead and reprogram her  parameters.  She has an appointment to see Dr. Ladona Ridgel in December and I  will plan to see her back in 4 months.   ADDENDUM:  We interrogated [name] defibrillator today and the  thresholds  were good and there was no shocks or pacing for tachycardia.  We performed reprogramming in accordance with the guidelines for her  lead alert. We increased, if her impedence on her pacing lead gets over  1000, the defibrillator is to beep and if the impedence on the  defibrillator portion of the lead goes over 100, the defibrillator will  beep. Also, made the criteria for detecting ventricular fibrillation  more stringent.     Bruce Elvera Lennox Juanda Chance, MD, [facility]  Electronically Signed    BRB/MedQ  DD: 02/23/2006  DT: 02/23/2006  Job #: [id number]

## 2010-08-09 NOTE — Discharge Summary (Signed)
[name], [name]                ACCOUNT NO.:  [id number]   MEDICAL RECORD NO.:  [id number]          PATIENT TYPE:  INP   LOCATION:  2041                         FACILITY:  [facility]   PHYSICIAN:  Bruce R. Juanda Chance, MD, FACCDATE OF BIRTH:  1926-02-18   DATE OF ADMISSION:  05/24/2006  DATE OF DISCHARGE:  05/28/2006                               DISCHARGE SUMMARY   PRIMARY CARDIOLOGIST:  Dr. Juanda Chance.   PRINCIPAL DIAGNOSIS:  Chest pain.   SECONDARY DIAGNOSES:  1. Coronary artery disease.  2. Ischemic cardiomyopathy with ejection fraction 25%.  3. Status post Medtronic implantable cardioverter defibrillator      placement with Sprint Fidelis leads.  4. 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  5. Hyperlipidemia.  6. Hypertension.  7. Degenerative joint disease.   ALLERGIES:  CODEINE.   PROCEDURES:  1. Adenosine Myoview.  2. Left heart cardiac catheterization.   HISTORY OF PRESENT ILLNESS:  A 77 year old female with prior history of  coronary artery disease status post acute anterior wall MI in 2006,  status post stenting of the LAD with a Cypher drug-eluting stent.  She  was in her usual state of health until May 24, 2006, when she began to  experience indigestion-like sensation in her chest with tightness and  without radiation.  EMS was called and she was treated with  nitroglycerin x2 without relief and then sublingual nitroglycerin spray  with eventual relief.  She was taken to the [facility] ED for further evaluation where EKG revealed a slightly  worsened ST elevation and T-wave inversion in leads V2 through V6, I and  aVL.  She was admitted for further evaluation.   HOSPITAL COURSE:  The patient ruled out for MI by cardiac markers x3.  She had no recurrent chest discomfort.  Arrangements were made for  adenosine Myoview which took place on April 27, 2006, revealing an EF  of 22% with large prior infarction involving the  distal half to two  thirds of the ventricle without evidence of significant ischemia.  Despite this finding on Myoview, Dr. Juanda Chance felt her symptoms were  concerning enough for angina that she should undergo cardiac  catheterization.  Her Coumadin was held throughout her hospitalization  and her INR finally dipped below 2 on May 28, 2006, and left heart  cardiac catheterization revealed a patent stent in the LAD and otherwise  nonobstructive coronary artery disease.  Post procedure, she has been  ambulating without difficulty or recurrent symptoms and she will be  discharged home today in satisfactory condition.   DISCHARGE LABORATORY DATA:  Hemoglobin 11.4, hematocrit 33.3, wbc 4.8,  platelets 231, MCV 91.4.  Sodium 137, potassium 4, chloride 100, CO2 27,  BUN 21, creatinine 0.67, glucose 98.  PT 22.7, INR 1.9.  Total bilirubin  0.7, alkaline phosphatase 42, AST 20, ALT 17, albumin 2.8.  Cardiac  enzymes negative x3.  Total cholesterol 21, triglycerides 48, HDL 49,  LDL 62.  Calcium 8.8.  TSH 1.707.   DISPOSITION:  Patient is being discharged home today in good condition.  FOLLOW-UP PLANS AND APPOINTMENTS:  She has follow-up with Dr. Charlton Haws. or nurse practitioner on June 10, 2006, at 9:45 a.m.  She is to  follow up at [facility] on June 02, 2006, at  9:00 a.m.   DISCHARGE MEDICATIONS:  1. Fosamax 70 mg weekly.  2. Vytorin 10/40 mg daily.  3. Coreg 12.5 mg b.i.d.  4. Enalapril 5 mg b.i.d.  5. Potassium 10 mEq daily.  6. Lasix 20 mg daily.  7. Coumadin as previously prescribed.  8. Nitroglycerin 0.4 mg sublingual p.r.n. chest pain.   OUTSTANDING LABORATORY STUDIES:  None.   DURATION OF DISCHARGE ENCOUNTER:  40 minutes including physician time.      Nicolasa Ducking, ANP      Bruce R. Juanda Chance, MD, [facility]  Electronically Signed    CB/MEDQ  D:  05/28/2006  T:  05/28/2006  Job:  [id number]

## 2010-08-09 NOTE — Cardiovascular Report (Signed)
[name], HILBURN                ACCOUNT NO.:  [id number]   MEDICAL RECORD NO.:  [id number]          PATIENT TYPE:  INP   LOCATION:  2041                         FACILITY[facility]  [facility]   PHYSICIAN:  Bruce R. Juanda Chance, MD, FACCDATE OF BIRTH:  [birth year]   DATE OF PROCEDURE:  05/28/2006  DATE OF DISCHARGE:                            CARDIAC CATHETERIZATION   HISTORY:  This patient is 77 years old and suffered an anterior wall  myocardial infarction in February 2006, treated with a Cypher stent to  the proximal LAD.  She was left with severe left ventricular dysfunction  with an ejection fraction of 25% which did not recover.  Despite this  she has done quite well.  She had an ICD placed.  She was admitted  recently with chest pain and a prolonged INR after taking Aleve.  We did  a Myoview scan which showed no ischemia but because of our concern about  her symptoms we elected to proceed with catheterization today.   PROCEDURE:  The procedure was performed via the right femoral artery and  arterial sheath and 4-French preformed coronary catheters.  We did  selective coronary angiography only, did not enter the left ventricle  because she has a history of an LV thrombus.  We closed the right  femoral with Angio-Seal at the end the procedure.  The patient tolerated  the procedure well and left the laboratory in satisfactory condition.   RESULTS:  Left main coronary artery main:  The left main coronary artery  was free of disease.   Left anterior descending artery:  The left anterior descending artery  gave rise to three septal perforators and two diagonal branches.  There  was 0% stenosis in the stent site in the proximal LAD.  There was 55%  ostial stenosis in the first diagonal branch.   The circumflex artery:  The circumflex artery gave rise to a ramus  branch and a large posterolateral branch.  These vessels were free of  significant disease.   The right coronary artery.  This was a  large dominant vessel. gave rise  to a clonus branch, a right ventricular branch, a posterior descending  branch and four posterolateral branches.  There was 30% narrowing of the  distal right coronary after the posterior descending branch.   No left ventriculogram was performed.   CONCLUSION:  Coronary artery disease, status post prior anterior wall  myocardial portion treated with a Cypher stent with nonobstructive  coronary artery disease with 0% stenosis at the stent site in the  proximal LAD, 50% narrowing at the ostium of the first diagonal branch,  no significant obstruction of the circumflex artery and 30% narrowing of  the distal right coronary.   RECOMMENDATIONS:  Reassurance.  The patient has nonobstructive disease.  I do not think her recent symptoms were related to ischemia.  She still  will have some limitations related to her severe left ventricular  dysfunction.  I suspect the recent symptoms may have been related to  Aleve.  Will not plan any further workup unless she has recurrence.   ADDENDUM:  The aortic  pressure was 136/80.      Bruce Elvera Lennox Juanda Chance, MD, [facility]  Electronically Signed     BRB/MEDQ  D:  05/28/2006  T:  05/28/2006  Job:  [id number]   cc:   Janae Bridgeman. Eloise Harman., M.D.

## 2010-08-09 NOTE — Op Note (Signed)
[name], [name]                ACCOUNT NO.:  [id number]   MEDICAL RECORD NO.:  [id number]          PATIENT TYPE:  AMB   LOCATION:  DAY                          FACILITY:  [facility]   PHYSICIAN:  Jamison Neighbor, M.D.  DATE OF BIRTH:  1925/10/29   DATE OF PROCEDURE:  04/07/2006  DATE OF DISCHARGE:  04/07/2006                               OPERATIVE REPORT   PREOPERATIVE DIAGNOSIS:  Right ureteral calculus.   POSTOPERATIVE DIAGNOSIS:  Right ureteral calculus.   PROCEDURE:  Cystoscopy, right retrograde with interpretation, right  ureteroscopy, right in situ laser with basket extraction of the  fragments, and right double J catheter insertion.   SURGEON:  Jamison Neighbor, M.D.   ANESTHESIA:  General.   COMPLICATIONS:  None.   DRAINS:  6-French x 24 cm double-J catheter.   HISTORY:  This 77 year old female has a stone impacted in the right  ureter that will not pass. She is now to undergo cystoscopy, retrograde  studies, ureteroscopy, laser lithotripsy, etc., to remove the stone.  She understands the risks and benefits of the procedure and gave full  informed consent.   DESCRIPTION OF PROCEDURE:  After successful induction of general  anesthesia, the patient was placed in the dorsal lithotomy position,  prepped with Betadine and draped in the usual sterile fashion.  Cystoscopy was performed.  The bladder was carefully inspected.  No  tumors or stones could be seen.  A right retrograde study was performed.   A 5-French ureteral catheter was inserted in the opening and a  retrograde study was performed.  The ureter was of normal caliber up to  the stone which was down towards the distal third of the ureter. Above  that area, there was hydronephrosis but no other filling defects could  be identified.  The collecting system appeared to be normal although  dilated. The guidewire was passed through the retrograde catheter which  was then removed.   Following completion of the  retrograde and passing the guidewire, the  ureteroscope was inserted.  The stone could be visualized and was fully  fragmented. Many of the larger fragments were then removed in order to  completely clear the ureter.  A double-J catheter was left in place.  This was passed over the guidewire and allowed to coil normally within  the bladder and within the renal pelvis.  The patient tolerated the  procedure well and was taken to  the recovery room in good condition.  She will be sent home with pain  medication as well as Macrodantin one daily and Pyridium that she can  use as needed for any burning or spasm. She will return to the office  for removal of the double-J stent.           ______________________________  Jamison Neighbor, M.D.  Electronically Signed     RJE/MEDQ  D:  06/06/2006  T:  06/07/2006  Job:  [id number]

## 2010-08-09 NOTE — Assessment & Plan Note (Signed)
[facility]                            CARDIOLOGY OFFICE NOTE   NAME:Goertz, JESSAMYN WATTERSON                       MRN:          [id number]  DATE:02/23/2006                            DOB:          1925/05/18    ADDENDUM:  We interrogated Ms. Fedewa's defibrillator today and the  thresholds were good and there was no shocks or pacing for tachycardia.  We performed reprogramming in accordance with the guidelines for her  lead alert. We increased, if her impedence on her pacing lead gets over  1000, the defibrillator is to beep and if the impedence on the  defibrillator portion of the lead goes over 100, the defibrillator will  beep. Also, made the criteria for detecting ventricular fibrillation  more stringent.     Bruce Elvera Lennox Juanda Chance, MD, [facility]     BRB/MedQ  DD: 02/23/2006  DT: 02/23/2006  Job #: [id number]

## 2010-08-09 NOTE — H&P (Signed)
[name], [name] NO.:  [id number]   MEDICAL RECORD NO.:  [id number]          PATIENT TYPE:  INP   LOCATION:  2041                         FACILITY:  [facility]   PHYSICIAN:  Pricilla Riffle, MD, FACCDATE OF BIRTH:  [birth year]   DATE OF ADMISSION:  05/24/2006  DATE OF DISCHARGE:                              HISTORY & PHYSICAL   IDENTIFICATION:  [name] is a 77 year old followed by Hector Brunswick who  comes in today for evaluation of chest pressure.   The patient has a significant history for coronary artery disease.  Her  last cardiac catheterization was back in 2006 when she had an acute  anterior wall MI.  Catheterization at that time showed left main was  free of disease.  LAD was occluded at the ostium and was circumflex at  the ramus branch, was free of significant disease.  RCA was a moderately  large vessel, gave rise to RV branch, had a 30% narrowing distally to  the PDA.  On LV gram, LVF was 25-30%.  Underwent PTCA stent with cipher  stent.   The patient says recently she has been doing okay, from a cardiac  standpoint, without any significant chest pain.  Her breathing has been  pretty good.  She takes activities as tolerated.  She was last in  clinic, it looks like, back in December.   Today she went to church and she began developing an indigestion-like  sensation.  She said the pain was a tightness without radiation.  It  actually occurred on the way to church, 10/10.  She stopped at her  daughter's, took 2 nitroglycerin without relief.  EMS arrived.  She got  additional nitroglycerin including sublingual spray and eventually  became pain-free.   ALLERGIES:  CODEINE.   MEDICATIONS:  Nitroglycerin as needed, Coumadin, Lasix 20 daily, Vytorin  10/40 daily, Coreg 12.5 b.i.d., Tums as needed, and aspirin.   PAST MEDICAL HISTORY:  1. CAD, as noted above.  2. Ischemic cardiomyopathy with an LVF of 25%.  Status post ICD      placement (Sprint  Fidelis).  3. 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  4. Dyslipidemia.  5. Hypertension.  6. DJD.   SOCIAL HISTORY:  The patient lives in [place].  She has children.  She  is widowed.  Does not smoke.  Does not drink.   FAMILY HISTORY:  Mother had cancer.  Father had a CVA.  No CAD.  Siblings with cancer and diabetes.   REVIEW OF SYSTEMS:  All systems are reviewed.  Negative to the above  problem except as noted.   PHYSICAL EXAMINATION:  GENERAL:  The patient is currently pain-free in  no acute distress.  Blood pressure 130/70, pulse is 68 and regular,  temperature is 97.9, O2 saturation on 2 liters is 99%.  HEENT:  Normocephalic, atraumatic.  EOMI, PERRL.  Throat clear.  Neck:  JVP appears normal.  No significant bruits.  LUNGS:  Clear without significant rales noted.  CARDIAC EXAM:  Regular rate and rhythm, S1 and S2.  No S3.  No  significant murmurs.  ABDOMEN:  Supple.  No hepatomegaly, nontender, no masses.  EXTREMITIES:  Good distal pulses.  No significant edema.  NEUROLOGIC EXAM:  The patient is alert and oriented times 3, moving all  extremities.   RADIOLOGY DATA:  Chest x-ray shows cardiomegaly.  No edema.  Bibasilar  atelectasis.   EKG shows sinus bradycardia at a rate of 59 beats per minute.  Left  anterior fascicular block.  Anterolateral MI with Q waves in V2 through  V5.  Slight ST elevation with coved ST segments and T-wave inversion in  leads V2 through V6, 1 and L.  Note, on review of previous EKG from  April 07, 2006 ST segment appears slightly more elevated and T-wave  inversion is more pronounced.   LABORATORY DATA:  Labs are significant for hemoglobin of 10.7, WBC of  5.6, BUN and creatinine of 26 and 1, potassium of 4.4, troponin less  than 0.05, and INR is 6.7.   IMPRESSION:  The patient is a 77 year old female, history of coronary  artery disease, ischemic cardiomyopathy, had an myocardial infarction in  2006.  Now with  indigestion which is somewhat similar but not as severe.  Does not radiate like myocardial infarction.  Plan to admit, rule out.  No additional anticoagulation given INR.  Note, she was given muscle  relaxants.  Question if relaxants effect INR.  Will guaiac all stools,  follow hemoglobin.  Prescription with Protonix.   PLAN:  Based on medical workup, again may just maximize medications,  given age.      Pricilla Riffle, MD, [facility]  Electronically Signed     PVR/MEDQ  D:  05/24/2006  T:  05/24/2006  Job:  [id number]

## 2010-08-09 NOTE — Assessment & Plan Note (Signed)
[facility]                            CARDIOLOGY OFFICE NOTE   ROSAMARIA, DONN                       MRN:          [id number]  DATE:07/14/2006                            DOB:          1925/03/30    PRIMARY CARE PHYSICIAN:  Pearson Grippe, M.D.   CLINICAL HISTORY:  Ms. Sarria is 77 years old and has had a remote  anterior wall infarction in 2006 treated with a Cypher drug-eluting  stent to the LAD.  Her ventricle did not recover much from her infarct  and she has been left with an ejection fraction of 225 although she has  been quite functional.  She was admitted last month with an episode of  chest discomfort.  She had a catheterization which showed no evidence of  restenosis or progression of disease and it was felt that her symptoms  were probably related to the use of Advil.   She has done well since that time.  She does note a slight bit of  swelling in her right foot.  She has had no chest pain and no  palpitations.   Her past medical history is significant for an apical clot in the left  ventricle which was found in December 2007.  She also has an ICD in  place, hyperlipidemia, and a history of hypertension.   Current medications include Vytorin, alpha-lipoic acid, Fosamax,  enalapril, Coumadin, Lasix, K-Dur, aspirin, and Coreg.   On examination, the blood pressure is 150/72 and the pulse 75 and  regular.  She indicated her blood pressure is 127 at home.  There was no  venous distention.  The carotid pulses were full and there were no  bruits.  The chest was clear without rales or rhonchi.  Cardiac rhythm  was regular.  I could hear no murmurs or gallops.  The abdomen was soft  with normal bowel sounds.  There was trace peripheral on the right side  and no peripheral edema on the left side and the pedal pulses were  equal.   IMPRESSION:  1. Coronary artery disease status post anterior wall myocardial      infarction February 2006 treated  with a Cypher stent to the left      anterior descending artery, stable.  2. Ischemic cardiomyopathy, ejection fraction of 25%.  3. Class II to III congestive heart failure.  4. Status post implantable cardioverter-defibrillator implantation.  5. Left ventricular mural thrombus detected by echocardiogram December      2007.  6. History of hypertension.  7. Hyperlipidemia.  8. Lead alert with a Medtronic lead for her implantable cardioverter-      defibrillator.   RECOMMENDATIONS:  I think Ms. Aubuchon is doing quite well.  I do not detect  any fluid overload despite her comments about slight edema in the right  ankle.  We will plan to continue the same  medications.  I will plan to see her back in 4 months.  She is to call  us if she has any increased edema.     Bruce Elvera Lennox Juanda Chance, MD, [facility]  Electronically  Signed    BRB/MedQ  DD: 07/14/2006  DT: 07/14/2006  Job #: [id number]   cc:   Pearson Grippe, M.D.

## 2010-08-09 NOTE — Discharge Summary (Signed)
[name], [name]                ACCOUNT NO.:  [id number]   MEDICAL RECORD NO.:  [id number]          PATIENT TYPE:  INP   LOCATION:  6533                         FACILITY:  [facility]   PHYSICIAN:  Doylene Canning. Ladona Ridgel, M.D.  DATE OF BIRTH:  1925-09-08   DATE OF ADMISSION:  11/28/2004  DATE OF DISCHARGE:  11/29/2004                                 DISCHARGE SUMMARY   DISCHARGE DIAGNOSES:  1.  Status post implantation of Medtronic MAXIMO single chamber      defibrillator.  2.  History of anterior wall myocardial infarction, February 2006  3.  Ischemic cardiomyopathy, ejection fraction 25% without improvement, nine      months after myocardial infarction.  4.  Class II congestive heart failure.   SECONDARY DIAGNOSES:  1.  Hypertension.  2.  Dyslipidemia.   PROCEDURE:  November 28, 2004:  Electrophysiology study with implantation of  VVI ICD, single chamber, for ischemic cardiomyopathy.  Ejection fraction  25%.  A Medtronic MAXIMO VR 7232Cx device implanted by Dr. Ladona Ridgel.  Defibrillator threshold study less than or equal to 15 joules.   DISPOSITION:  [name], discharging day #1.  After implantation of  cardioverter defibrillator, she has had no intercurrent therapies.  She is  achieving 95% oxygen saturation on room air.  She is in sinus rhythm.  The  incision is healing nicely without hematoma.  Mobility has been discussed  with the patient.  The device has been interrogated and all values within  normal limits.  Chest x-ray shows the lead is in the appropriate position.  Discharging on a low sodium and low cholesterol diet.  She is asked to keep  her incision dry for the next seven days, sponge bathe until Thursday,  September 14.  She was asked not to drive for the next week and to avoid  heavy lifting for the next two weeks.  Pain management:  Tylenol 325 mg one  to two tabs every to six hours.   She also goes home on her preprocedure medications which include:  1.  Vytorin 10/40  one tab daily at bedtime.  2.  Coreg 12.5 mg twice daily.  3.  Plavix 75 mg daily.  4.  Enalapril 5 mg twice daily.  5.  Lasix 40 mg daily.  6.  K-Dur 10 mEq daily.  7.  Sotalol 75 mg daily.  8.  Enteric-coated aspirin 325 mg daily.  9.  Nitroglycerin 0.4 mg one tablet under the tongue every five minutes      times three doses as needed for chest pain.  10. Fosamax 70 mg one tab weekly.   She was to follow up with [facility], the ICD clinic, Wednesday,  September 20 at 10:45.  This is at [address]  She will see Dr.  Ladona Ridgel, December 19 at 10:40 in the morning.   BRIEF HISTORY:  [name] is a 77 year old female.  She has a history of  anterior wall myocardial infarction in February 2006.  Initially she had  significant congestive heart failure, but heart failure medications have  reduced  her symptoms to class II.  Her ejection fraction has been rechecked  nine months after her infarct by echocardiogram.  The ejection fraction was  25%.  There has been no significant improvement in the interim and heart  function.  She is now referred for consideration of cardioverter  defibrillator implantation for primary prevention.  The patient denies a  history of syncope.  The risks, benefits and goals of implantation of a  prophylactic implantable cardioverter defibrillator have been discussed with  the patient and she wishes to proceed.   HOSPITAL COURSE:  The patient was admitted electively November 28, 2004 and  underwent implantation of cardioverter defibrillator the same day.  She has  had no post procedural complications.  She goes home on her preprocedure  medications.  The followup is dictated above.      Maple Mirza, P.A.    ______________________________  Doylene Canning. Ladona Ridgel, M.D.    GM/MEDQ  D:  11/29/2004  T:  11/29/2004  Job:  [id number]   cc:   Janae Bridgeman. Eloise Harman., M.D.  [address]  [place]  [place] [address]  Fax: [phone]

## 2010-08-09 NOTE — Op Note (Signed)
[name], [name]                ACCOUNT NO.:  [id number]   MEDICAL RECORD NO.:  [id number]          PATIENT TYPE:  INP   LOCATION:  2852                         FACILITY:  [facility]   PHYSICIAN:  Doylene Canning. Ladona Ridgel, M.D.  DATE OF BIRTH:  [birth year]   DATE OF PROCEDURE:  11/28/2004  DATE OF DISCHARGE:                                 OPERATIVE REPORT   PROCEDURE PERFORMED:  Implantation of a single chamber defibrillator.   INTRODUCTION:  The patient is a 77 year old woman with a history of status  post myocardial infarction with EF of 25% now 9 months after her initial  infarct. She has had no improvement in her LV function. She has a history of  nonsustained VT. She has class II heart failure.   PROCEDURE:  After informed consent was obtained, the patient was taken to  diagnostic EP lab in fasting state. After usual preparation and draping,  intravenous fentanyl and midazolam was given for sedation. 30 mL of  lidocaine was infiltrated in the left infraclavicular region. An 8 cm  incision was carried out over this region. Electrocautery utilized to  dissect down to the fascial plane. The left subclavian vein was punctured  and the Medtronic model 6949 58-cm active fixation pacing lead serial number  [id number] was advanced by way of the subclavian vein into the right  ventricle. Mapping was carried out in final site in the RV apex. The R-waves  measured 14 mV and the pace impedance with lead actively fixed was 1100  ohms. Initial pacing threshold was 1.5 volt at 0.5 milliseconds. 10-volt  pacing with lead actively fixed did not stimulate the diaphragm. The lead  was then secured to subpectoralis fascia with a figure-of-eight silk suture.  In addition the sew-in sleeve was secured with silk suture. Electrocautery  was utilized to make subcutaneous pocket. Kanamycin irrigation was utilized  to irrigate the pocket. Electrocautery utilized to assure hemostasis. The  Medtronic maximal VR model  7232 single chamber defibrillator, serial number  [id number] was connected to the defibrillation lead and placed in the  subcutaneous pocket. Generator secured with silk suture. Additional  kanamycin was utilized to irrigate the pocket and the defibrillation  threshold testing was carried out.   After the patient was more deeply sedated with fentanyl and Versed, VF was  induced with T-wave shock. 15 joules shock was then delivered which  terminated ventricular fibrillation. 5 minutes was allowed to elapse and  second DFT test carried out. Again VF was induced with T-wave shock. This  time a 16 joules shock was delivered terminating ventricular fibrillation  and restoring sinus rhythm. At this point no additional defibrillation  threshold testing was carried out. The incision was closed with layer of 2-0  Vicryl followed by layer of 3-0 Vicryl followed by layer of 4-0 Vicryl.  Benzoin was painted on the skin, Steri-Strips were applied, pressure  dressing was placed. The patient returned to her room in satisfactory  condition.   COMPLICATIONS:  There were no immediate procedure complications.   RESULTS:  Successful implantation of a Medtronic single chamber  defibrillator in  a patient with an ischemic cardiomyopathy, status post  myocardial infarction with ejection fraction 25%.           ______________________________  Doylene Canning. Ladona Ridgel, M.D.     GWT/MEDQ  D:  11/28/2004  T:  11/28/2004  Job:  [id number]   cc:   Charlies Constable, M.D. [facility]  [address]  [address]  [place]  [place] [address]

## 2010-08-09 NOTE — H&P (Signed)
[name], DULEY NO.:  [id number]   MEDICAL RECORD NO.:  [id number]          PATIENT TYPE:  OUT   LOCATION:  CATH                         FACILITY:  [facility]   PHYSICIAN:  Charlies Constable, M.D. [facility] DATE OF BIRTH:  [birth year]   DATE OF ADMISSION:  05/17/2004  DATE OF DISCHARGE:                                HISTORY & PHYSICAL   REASON FOR ADMISSION:  [name] is a 77 year old female with no prior  cardiac history, who presented to [facility] emergency room  with  acute inferior myocardial infarction.  The patient reports a several month  history of intermittent chest discomfort which she felt was heartburn and  for which she took Tums.  However, at approximately 1 p.m. today, after  having had lunch at a local pizza hut, the patient suddenly developed  nausea, vomiting, dyspnea, and chest discomfort.  She presented with  complaint of 10/10 chest discomfort and electrocardiogram revealed ST  elevation in leads v1-v3 with ST coving in leads 1 and AVL.  She was treated  with four baby aspirin, 9 mg IV morphine, IV nitroglycerin, and IV heparin.  On arrival to the cardiac catheterization lab, the patient continues to  report persistent chest pain (8-10) and telemetry reveals persistent ST  elevation anteriorly.   ALLERGIES:  Codeine.   MEDICATIONS PRIOR TO ADMISSION:  Norvasc, cholesterol pill.   PAST MEDICAL HISTORY:  Hypertension, hypercholesterolemia.   SOCIAL HISTORY:  The patient lives near [place].  She is widowed and has  three children.  She denies tobacco or alcohol use.   FAMILY HISTORY:  She denies any known history of coronary artery disease.   REVIEW OF SYMPTOMS:  As noted per HPI.  Denies orthopnea, PND, exertional  dyspnea, or lower extremity edema.  The remaining systems are negative.   PHYSICAL EXAMINATION:  VITAL SIGNS:  Blood pressure 154/93, pulse 87 and regular, respirations 18,  saturations 97%.  GENERAL:  76-year-old female in  no apparent distress.  HEENT:  Normocephalic, atraumatic.  NECK:  Carotid pulses without bruits.  LUNGS:  Clear to auscultation bilaterally.  HEART:  Regular rate and rhythm (S1 and S2), no significant murmurs.  ABDOMEN:  Benign.  EXTREMITIES:  Intact distal pulses without significant edema.  NEUROLOGICAL:  Alert and oriented.   LABORATORY DATA:  Admission chest x-ray shows bronchitic changes.  Electrocardiogram NSR 72 BPM with left axis deviation/LAHD, ST elevation in  leads v1 and v3 with ST coving in 1 and AVL.  MB 3.2, troponin I less than  0.05, hematocrit 38, platelets 275.  Potassium 3.5, BUN 20, creatinine 0.8,  glucose 219.  Liver enzymes normal.  INR 1.   IMPRESSION:  1.  Acute inferior myocardial infarction.  2.  Hypertension.  3.  Hypercholesterolemia.  4.  Hypokalemia.   PLAN:  Proceed with emergent coronary angiography and percutaneous  intervention.  The patient will be continued on aspirin, intravenous  nitroglycerin, and heparin.  We will start her on Lopressor 25 b.i.d. and  high dose Lipitor.  In addition to routine labs, we will check a fasting  lipid profile in the morning, TSH, and hemoglobin A1C.      GS/MEDQ  D:  05/17/2004  T:  05/17/2004  Job:  [id number]   cc:   Janae Bridgeman. Eloise Harman., M.D.  [address]  [place]  [place] [address]  Fax: [phone]

## 2010-08-15 ENCOUNTER — Encounter: Payer: Medicare Other | Admitting: *Deleted

## 2010-08-16 ENCOUNTER — Encounter: Payer: Self-pay | Admitting: *Deleted

## 2010-08-20 ENCOUNTER — Other Ambulatory Visit: Payer: Self-pay | Admitting: Internal Medicine

## 2010-08-20 DIAGNOSIS — R945 Abnormal results of liver function studies: Secondary | ICD-10-CM

## 2010-08-21 ENCOUNTER — Other Ambulatory Visit: Payer: Self-pay | Admitting: Internal Medicine

## 2010-08-22 ENCOUNTER — Encounter: Payer: Self-pay | Admitting: Cardiology

## 2010-08-22 ENCOUNTER — Ambulatory Visit (INDEPENDENT_AMBULATORY_CARE_PROVIDER_SITE_OTHER): Payer: Medicare Other | Admitting: *Deleted

## 2010-08-22 DIAGNOSIS — I472 Ventricular tachycardia: Secondary | ICD-10-CM

## 2010-08-26 ENCOUNTER — Other Ambulatory Visit: Payer: Self-pay | Admitting: *Deleted

## 2010-08-26 ENCOUNTER — Encounter: Payer: Medicare Other | Admitting: *Deleted

## 2010-08-26 MED ORDER — SPIRONOLACTONE 25 MG PO TABS: 25.0000 mg | ORAL_TABLET | Freq: Every day | ORAL | Status: DC

## 2010-08-27 ENCOUNTER — Ambulatory Visit
Admission: RE | Admit: 2010-08-27 | Discharge: 2010-08-27 | Disposition: A | Discharge status: Home / Self Care | Payer: Medicare Other | Source: Ambulatory Visit | Attending: Internal Medicine | Admitting: Internal Medicine

## 2010-08-27 ENCOUNTER — Encounter: Payer: Medicare Other | Admitting: *Deleted

## 2010-08-27 DIAGNOSIS — R945 Abnormal results of liver function studies: Secondary | ICD-10-CM

## 2010-08-28 NOTE — Progress Notes (Signed)
ICD REMOTE  

## 2010-09-03 ENCOUNTER — Encounter: Payer: Self-pay | Admitting: Cardiology

## 2010-09-03 ENCOUNTER — Ambulatory Visit (INDEPENDENT_AMBULATORY_CARE_PROVIDER_SITE_OTHER): Payer: Medicare Other | Admitting: *Deleted

## 2010-09-03 ENCOUNTER — Ambulatory Visit (INDEPENDENT_AMBULATORY_CARE_PROVIDER_SITE_OTHER): Payer: Medicare Other | Admitting: Cardiology

## 2010-09-03 DIAGNOSIS — Z95810 Presence of automatic (implantable) cardiac defibrillator: Secondary | ICD-10-CM

## 2010-09-03 DIAGNOSIS — I253 Aneurysm of heart: Secondary | ICD-10-CM

## 2010-09-03 DIAGNOSIS — E785 Hyperlipidemia, unspecified: Secondary | ICD-10-CM

## 2010-09-03 DIAGNOSIS — I2589 Other forms of chronic ischemic heart disease: Secondary | ICD-10-CM

## 2010-09-03 DIAGNOSIS — I428 Other cardiomyopathies: Secondary | ICD-10-CM

## 2010-09-03 DIAGNOSIS — I5022 Chronic systolic (congestive) heart failure: Secondary | ICD-10-CM

## 2010-09-03 DIAGNOSIS — I2510 Atherosclerotic heart disease of native coronary artery without angina pectoris: Secondary | ICD-10-CM

## 2010-09-03 DIAGNOSIS — Z7901 Long term (current) use of anticoagulants: Secondary | ICD-10-CM

## 2010-09-03 LAB — POCT INR: INR: 2.7

## 2010-09-03 MED ORDER — LOSARTAN POTASSIUM 50 MG PO TABS: 50.0000 mg | ORAL_TABLET | Freq: Two times a day (BID) | ORAL | Status: DC

## 2010-09-03 NOTE — Assessment & Plan Note (Signed)
Check lipids/LFTs with goal LDL < 70.  

## 2010-09-03 NOTE — Assessment & Plan Note (Signed)
Stable.  One episode of mild atypical chest pain 2-3 weeks ago.  Continue ASA, statin, ACEI, beta blocker.

## 2010-09-03 NOTE — Progress Notes (Signed)
PCP: Dr. Selena Batten  77 yo with history of CAD, ischemic CMP and apical aneurysm with LV thrombus presents for cardiology followup.   Patient's main complaint is low back pain.  This has been chronic.  She is not very active but denies exertional dyspnea when walking around the house.  She woke up about 2-3 weeks with mild chest pressure and took a nitroglycerine.  She has not had pain before or since.  She is steady on her feet with no falls and has had no problems with warfarin.  SBP is mildly elevated today but has been running in the 120s at home.   Labs (12/11): K 4.1, creatinine 1.1 Labs (4/12): K 4.6, creatinine 1.2, BUN 43, BNP 271  Allergies (verified):  1)  ! Codeine 2)  ! Williams Che  Past Medical History: 1. HYPERCHOLESTEROLEMIA (ICD-272.0) 2. ARTHRITIS (ICD-716.90): Severe low back pain.  3. ISCHEMIC CARDIOMYOPATHY (ICD-414.8): Echo (1/10) with EF 25-30%, mildly dilated LV, periapical LV aneurysm, calcified LV thrombus, moderate MR.  Medtronic ICD set to VVI.  4. CAD (ICD-414.00): Anterior MI in 2006 with Cypher DES to LAD.  5. HYPERTENSION (ICD-401.9) 6. LV thrombus on coumadin.   Social History: The patient lives alone near [place]. 3 daughters live nearby. Widowed Tobacco Use - No.  Alcohol Use - no  Review of Systems        All systems reviewed and negative except as per HPI.   Current Outpatient Prescriptions  Medication Sig Dispense Refill  . aspirin 81 MG tablet Take 81 mg by mouth daily.        . carvedilol (COREG) 25 MG tablet Take 25 mg by mouth 2 (two) times daily.        . cholecalciferol (VITAMIN D) 1000 UNITS tablet Take 1,000 Units by mouth daily.        Marland Kitchen ezetimibe-simvastatin (VYTORIN) 10-40 MG per tablet Take 1 tablet by mouth daily.        . furosemide (LASIX) 40 MG tablet Take 60 mg by mouth daily.        . Glucosamine Sulfate 500 MG TABS Take by mouth daily.        . Multiple Vitamin (MULTIVITAMIN) tablet Take 1 tablet by mouth daily.        .  nitroGLYCERIN (NITROSTAT) 0.4 MG SL tablet Place 0.4 mg under the tongue every 5 (five) minutes as needed.        . Omega-3 Fatty Acids (FISH OIL) 1000 MG CAPS Take by mouth daily.        . sertraline (ZOLOFT) 50 MG tablet Take 50 mg by mouth daily.        Marland Kitchen spironolactone (ALDACTONE) 25 MG tablet Take 1 tablet (25 mg total) by mouth daily.  30 tablet  6  . warfarin (COUMADIN) 2.5 MG tablet Take 1 tablet (2.5 mg total) by mouth as directed.  30 tablet  3  . DISCONTD: losartan (COZAAR) 50 MG tablet Take 1 in the am and 1/2 in the evening      . DISCONTD: potassium chloride (KLOR-CON) 10 MEQ CR tablet Take 10 mEq by mouth daily.        Marland Kitchen losartan (COZAAR) 50 MG tablet Take 1 tablet (50 mg total) by mouth 2 (two) times daily.  60 tablet  6    BP 144/73  Pulse 64  Resp 14  Ht 4\' 10"  (1.473 m)  Wt 150 lb (68.04 kg)  BMI 31.35 kg/m2 General:  Elderly woman in no distress.  Neck:  Neck supple, no JVD. No masses, thyromegaly or abnormal cervical nodes. Lungs:  Clear bilaterally to auscultation and percussion. Heart:  Non-displaced PMI, chest non-tender; regular rate and rhythm, S1, S2 without murmurs, rubs or gallops. Carotid upstroke normal, no bruit.  Pedals normal pulses. No edema, no varicosities. Abdomen:  Bowel sounds positive; abdomen soft and non-tender without masses, organomegaly, or hernias noted. No hepatosplenomegaly. Extremities:  No clubbing or cyanosis. Neurologic:  Alert and oriented x 3. Psych:  Normal affect.

## 2010-09-03 NOTE — Assessment & Plan Note (Signed)
Apical aneurysm with LV thrombus.  Continue coumadin.

## 2010-09-03 NOTE — Progress Notes (Signed)
icd check per research by industry

## 2010-09-03 NOTE — Assessment & Plan Note (Signed)
Chronic systolic CHF.  NYHA class II symptoms (more limited by her severe low back pain).  Volume status looks ok.  Continue current Lasix, Coreg, and spironolactone doses.  I will increase losartan to 50 mg bid.  She can stop KCl supplement.  BMET in 2 weeks.

## 2010-09-03 NOTE — Patient Instructions (Signed)
Increase losartan to 50mg  two times a day.  Stop KCL(potassium).  Schedule an appointment for fasting lab in 2 weeks--lipid profile/liver profile/BMP 414.01  428.22  Schedule an appointment with Dr Shirlee Latch in 3 months.

## 2010-09-03 NOTE — Assessment & Plan Note (Signed)
ICD will be checked today.

## 2010-09-16 ENCOUNTER — Other Ambulatory Visit (INDEPENDENT_AMBULATORY_CARE_PROVIDER_SITE_OTHER): Payer: Medicare Other | Admitting: *Deleted

## 2010-09-16 ENCOUNTER — Other Ambulatory Visit: Payer: Self-pay | Admitting: Pharmacist

## 2010-09-16 DIAGNOSIS — I5022 Chronic systolic (congestive) heart failure: Secondary | ICD-10-CM

## 2010-09-16 DIAGNOSIS — I2510 Atherosclerotic heart disease of native coronary artery without angina pectoris: Secondary | ICD-10-CM

## 2010-09-16 LAB — HEPATIC FUNCTION PANEL
Alkaline Phosphatase: 51 U/L (ref 39–117)
Bilirubin, Direct: 0.1 mg/dL (ref 0.0–0.3)
Total Bilirubin: 0.6 mg/dL (ref 0.3–1.2)
Total Protein: 6.9 g/dL (ref 6.0–8.3)

## 2010-09-16 LAB — LIPID PANEL
Cholesterol: 128 mg/dL (ref 0–200)
LDL Cholesterol: 66 mg/dL (ref 0–99)
VLDL: 17.8 mg/dL (ref 0.0–40.0)

## 2010-09-16 LAB — BASIC METABOLIC PANEL
BUN: 41 mg/dL — ABNORMAL HIGH (ref 6–23)
CO2: 27 mEq/L (ref 19–32)
Chloride: 104 mEq/L (ref 96–112)
Creatinine, Ser: 1.3 mg/dL — ABNORMAL HIGH (ref 0.4–1.2)
Glucose, Bld: 108 mg/dL — ABNORMAL HIGH (ref 70–99)
Potassium: 4 mEq/L (ref 3.5–5.1)

## 2010-10-01 ENCOUNTER — Ambulatory Visit (INDEPENDENT_AMBULATORY_CARE_PROVIDER_SITE_OTHER): Payer: Medicare Other | Admitting: *Deleted

## 2010-10-01 DIAGNOSIS — I253 Aneurysm of heart: Secondary | ICD-10-CM

## 2010-10-01 DIAGNOSIS — Z7901 Long term (current) use of anticoagulants: Secondary | ICD-10-CM

## 2010-10-10 ENCOUNTER — Other Ambulatory Visit: Payer: Self-pay | Admitting: Cardiology

## 2010-10-29 ENCOUNTER — Encounter: Payer: Medicare Other | Admitting: *Deleted

## 2010-10-31 ENCOUNTER — Ambulatory Visit (INDEPENDENT_AMBULATORY_CARE_PROVIDER_SITE_OTHER): Payer: Medicare Other | Admitting: *Deleted

## 2010-10-31 DIAGNOSIS — Z7901 Long term (current) use of anticoagulants: Secondary | ICD-10-CM

## 2010-10-31 DIAGNOSIS — I253 Aneurysm of heart: Secondary | ICD-10-CM

## 2010-11-08 ENCOUNTER — Other Ambulatory Visit: Payer: Self-pay | Admitting: Cardiology

## 2010-11-21 ENCOUNTER — Encounter: Payer: Medicare Other | Admitting: *Deleted

## 2010-11-26 ENCOUNTER — Telehealth: Payer: Self-pay | Admitting: Cardiology

## 2010-11-26 ENCOUNTER — Encounter: Payer: Self-pay | Admitting: *Deleted

## 2010-11-26 MED ORDER — EZETIMIBE-SIMVASTATIN 10-40 MG PO TABS: 1.0000 | ORAL_TABLET | Freq: Every day | ORAL | Status: DC

## 2010-11-26 NOTE — Telephone Encounter (Signed)
Needs new rx for vytorin to [facility]

## 2010-11-30 ENCOUNTER — Other Ambulatory Visit: Payer: Self-pay | Admitting: Internal Medicine

## 2010-11-30 ENCOUNTER — Encounter: Payer: Self-pay | Admitting: Internal Medicine

## 2010-12-02 ENCOUNTER — Ambulatory Visit (INDEPENDENT_AMBULATORY_CARE_PROVIDER_SITE_OTHER): Payer: Medicare Other | Admitting: *Deleted

## 2010-12-02 DIAGNOSIS — I472 Ventricular tachycardia, unspecified: Secondary | ICD-10-CM

## 2010-12-02 DIAGNOSIS — Z95810 Presence of automatic (implantable) cardiac defibrillator: Secondary | ICD-10-CM

## 2010-12-02 DIAGNOSIS — I428 Other cardiomyopathies: Secondary | ICD-10-CM

## 2010-12-05 ENCOUNTER — Ambulatory Visit: Payer: Medicare Other | Admitting: Cardiology

## 2010-12-05 ENCOUNTER — Encounter: Payer: Medicare Other | Admitting: *Deleted

## 2010-12-05 ENCOUNTER — Other Ambulatory Visit: Payer: Self-pay | Admitting: Cardiology

## 2010-12-09 NOTE — Progress Notes (Signed)
icd remote check  

## 2010-12-10 ENCOUNTER — Ambulatory Visit (INDEPENDENT_AMBULATORY_CARE_PROVIDER_SITE_OTHER): Payer: Medicare Other | Admitting: *Deleted

## 2010-12-10 ENCOUNTER — Encounter: Payer: Self-pay | Admitting: Cardiology

## 2010-12-10 ENCOUNTER — Ambulatory Visit (INDEPENDENT_AMBULATORY_CARE_PROVIDER_SITE_OTHER): Payer: Medicare Other | Admitting: Cardiology

## 2010-12-10 DIAGNOSIS — R0989 Other specified symptoms and signs involving the circulatory and respiratory systems: Secondary | ICD-10-CM

## 2010-12-10 DIAGNOSIS — I5022 Chronic systolic (congestive) heart failure: Secondary | ICD-10-CM

## 2010-12-10 DIAGNOSIS — R0609 Other forms of dyspnea: Secondary | ICD-10-CM

## 2010-12-10 DIAGNOSIS — I253 Aneurysm of heart: Secondary | ICD-10-CM

## 2010-12-10 DIAGNOSIS — I509 Heart failure, unspecified: Secondary | ICD-10-CM

## 2010-12-10 DIAGNOSIS — I219 Acute myocardial infarction, unspecified: Secondary | ICD-10-CM

## 2010-12-10 DIAGNOSIS — Z7901 Long term (current) use of anticoagulants: Secondary | ICD-10-CM

## 2010-12-10 DIAGNOSIS — I513 Intracardiac thrombosis, not elsewhere classified: Secondary | ICD-10-CM

## 2010-12-10 DIAGNOSIS — I2510 Atherosclerotic heart disease of native coronary artery without angina pectoris: Secondary | ICD-10-CM

## 2010-12-10 DIAGNOSIS — E785 Hyperlipidemia, unspecified: Secondary | ICD-10-CM

## 2010-12-10 NOTE — Patient Instructions (Signed)
Lab today--BMP/BNP  428.22 414.01  Your physician recommends that you schedule a follow-up appointment in: 3 months with Dr Shirlee Latch.

## 2010-12-11 DIAGNOSIS — I513 Intracardiac thrombosis, not elsewhere classified: Secondary | ICD-10-CM | POA: Insufficient documentation

## 2010-12-11 DIAGNOSIS — I5022 Chronic systolic (congestive) heart failure: Secondary | ICD-10-CM | POA: Insufficient documentation

## 2010-12-11 LAB — BRAIN NATRIURETIC PEPTIDE: Pro B Natriuretic peptide (BNP): 170 pg/mL — ABNORMAL HIGH (ref 0.0–100.0)

## 2010-12-11 LAB — BASIC METABOLIC PANEL
BUN: 37 mg/dL — ABNORMAL HIGH (ref 6–23)
Chloride: 104 mEq/L (ref 96–112)
Glucose, Bld: 114 mg/dL — ABNORMAL HIGH (ref 70–99)
Potassium: 4.8 mEq/L (ref 3.5–5.1)

## 2010-12-11 NOTE — Assessment & Plan Note (Signed)
Stable.  No chest pain.  Continue ASA, statin, ACEI, beta blocker.

## 2010-12-11 NOTE — Progress Notes (Signed)
PCP: Dr. Selena Batten  77 yo with history of CAD, ischemic CMP and apical aneurysm with LV thrombus presents for cardiology followup.   Patient's main complaint continues to be low back pain. This has been chronic.  She is not very active but denies exertional dyspnea when walking around the house or going to the mailbox.  No chest pain.  She did trip and break her foot recently getting out of the car but in general, she is stable.    ECG: NSR, old anterior MI, LAFB, LVH  Labs (12/11): K 4.1, creatinine 1.1 Labs (4/12): K 4.6, creatinine 1.2, BUN 43, BNP 271 Labs (6/12): K 4, creatinine 1.3, LDL 66, HDL 44  Allergies (verified):  1)  ! Codeine 2)  ! Williams Che  Past Medical History: 1. HYPERCHOLESTEROLEMIA (ICD-272.0) 2. ARTHRITIS (ICD-716.90): Severe low back pain.  3. ISCHEMIC CARDIOMYOPATHY (ICD-414.8): Echo (1/10) with EF 25-30%, mildly dilated LV, periapical LV aneurysm, calcified LV thrombus, moderate MR.  Medtronic ICD set to VVI.  4. CAD (ICD-414.00): Anterior MI in 2006 with Cypher DES to LAD.  5. HYPERTENSION (ICD-401.9) 6. LV thrombus on coumadin.  7. Macular degeneration  Social History: The patient lives alone near [place]. 3 daughters live nearby. Widowed Tobacco Use - No.  Alcohol Use - no   Current Outpatient Prescriptions  Medication Sig Dispense Refill  . aspirin 81 MG tablet Take 81 mg by mouth daily.        . cholecalciferol (VITAMIN D) 1000 UNITS tablet Take 1,000 Units by mouth daily.        Marland Kitchen ezetimibe-simvastatin (VYTORIN) 10-40 MG per tablet Take 1 tablet by mouth daily.  30 tablet  6  . furosemide (LASIX) 40 MG tablet TAKE 1 & 1/2 TABLETS BY MOUTH EVERY DAY  45 tablet  4  . Glucosamine Sulfate 500 MG TABS Take by mouth daily.        Marland Kitchen losartan (COZAAR) 50 MG tablet Take 1 tablet (50 mg total) by mouth 2 (two) times daily.  60 tablet  6  . Multiple Vitamin (MULTIVITAMIN) tablet Take 1 tablet by mouth daily.        . nitroGLYCERIN (NITROSTAT) 0.4 MG SL tablet  Place 0.4 mg under the tongue every 5 (five) minutes as needed.        . Omega-3 Fatty Acids (FISH OIL) 1000 MG CAPS Take by mouth daily.        . sertraline (ZOLOFT) 50 MG tablet Take 50 mg by mouth daily.        Marland Kitchen spironolactone (ALDACTONE) 25 MG tablet Take 1 tablet (25 mg total) by mouth daily.  30 tablet  6  . trimethoprim (TRIMPEX) 100 MG tablet Take 100 mg by mouth 1 day or 1 dose. For recurring UTIs       . warfarin (COUMADIN) 2.5 MG tablet TAKE 1 TABLET BY MOUTH ONCE A DAY OR AS INSTRUCTED BY PHYSICIAN/CLINIC  30 tablet  3  . carvedilol (COREG) 25 MG tablet TAKE ONE TABLET BY MOUTH TWICE DAILY  60 tablet  11    BP 138/66  Pulse 66  Ht 4\' 11"  (1.499 m)  Wt 149 lb 14.4 oz (67.994 kg)  BMI 30.28 kg/m2 General:  Elderly woman in no distress.  Neck:  Neck supple, no JVD. No masses, thyromegaly or abnormal cervical nodes. Lungs:  Clear bilaterally to auscultation and percussion. Heart:  Non-displaced PMI, chest non-tender; regular rate and rhythm, S1, S2 without murmurs, rubs or gallops. Carotid upstroke normal, no  bruit.  Pedals normal pulses. No edema, no varicosities. Abdomen:  Bowel sounds positive; abdomen soft and non-tender without masses, organomegaly, or hernias noted. No hepatosplenomegaly. Extremities:  No clubbing or cyanosis. Neurologic:  Alert and oriented x 3. Psych:  Normal affect.

## 2010-12-11 NOTE — Assessment & Plan Note (Signed)
Continue coumadin.  

## 2010-12-11 NOTE — Assessment & Plan Note (Signed)
LDL at goal (< 70) in 6/12.

## 2010-12-11 NOTE — Assessment & Plan Note (Addendum)
Patient does not look volume overloaded on exam.  Stable NYHA class III symptoms.  Continue current doses of Coreg, losartran, spironolactone, and lasix.  Check BMET/BNP today.

## 2010-12-16 LAB — URINALYSIS, ROUTINE W REFLEX MICROSCOPIC
Bilirubin Urine: NEGATIVE
Nitrite: NEGATIVE
Specific Gravity, Urine: 1.008
pH: 6.5

## 2010-12-16 LAB — CBC
HCT: 35.6 — ABNORMAL LOW
Hemoglobin: 12.1
MCHC: 34
RDW: 13

## 2010-12-16 LAB — BASIC METABOLIC PANEL
Calcium: 8.9
GFR calc Af Amer: >60
GFR calc non Af Amer: >60
Glucose, Bld: 102 — ABNORMAL HIGH
Sodium: 141

## 2010-12-16 LAB — I-STAT 8, (EC8 V) (CONVERTED LAB)
BUN: 18
Chloride: 107
HCT: 37
Hemoglobin: 12.6
Operator id: 257131
Potassium: 4.9
Sodium: 140

## 2010-12-16 LAB — DIFFERENTIAL
Basophils Absolute: 0
Basophils Relative: 0
Eosinophils Relative: 2
Monocytes Absolute: 0.3
Neutro Abs: 3.3

## 2010-12-16 LAB — PROTIME-INR
INR: 2.6 — ABNORMAL HIGH
Prothrombin Time: 28.6 — ABNORMAL HIGH

## 2010-12-16 LAB — URINE CULTURE: Colony Count: 10000

## 2010-12-20 ENCOUNTER — Telehealth: Payer: Self-pay | Admitting: Cardiology

## 2010-12-20 NOTE — Telephone Encounter (Signed)
I talked with pt about lab results done 12/10/10

## 2010-12-20 NOTE — Telephone Encounter (Signed)
Pt is returning your call about tests results

## 2010-12-25 ENCOUNTER — Encounter: Payer: Self-pay | Admitting: *Deleted

## 2011-01-07 ENCOUNTER — Ambulatory Visit (INDEPENDENT_AMBULATORY_CARE_PROVIDER_SITE_OTHER): Payer: Medicare Other | Admitting: *Deleted

## 2011-01-07 ENCOUNTER — Telehealth: Payer: Self-pay | Admitting: *Deleted

## 2011-01-07 DIAGNOSIS — I2510 Atherosclerotic heart disease of native coronary artery without angina pectoris: Secondary | ICD-10-CM

## 2011-01-07 DIAGNOSIS — I5022 Chronic systolic (congestive) heart failure: Secondary | ICD-10-CM

## 2011-01-07 DIAGNOSIS — Z7901 Long term (current) use of anticoagulants: Secondary | ICD-10-CM

## 2011-01-07 DIAGNOSIS — I253 Aneurysm of heart: Secondary | ICD-10-CM

## 2011-01-07 MED ORDER — FUROSEMIDE 40 MG PO TABS: 40.0000 mg | ORAL_TABLET | Freq: Every day | ORAL | Status: DC

## 2011-01-07 MED ORDER — LOSARTAN POTASSIUM 50 MG PO TABS: 50.0000 mg | ORAL_TABLET | Freq: Every day | ORAL | Status: DC

## 2011-01-07 NOTE — Telephone Encounter (Signed)
Medication adjustments. Dr Shirlee Latch received lab results from [facility] done 12/23/10. He recommended decreasing lasix 40mg  daily and decrease losartan to 50mg  daily, repeat BMET here  in 10 days. Pt has an appt in [facility] today and I will discuss with pt at the time of her appt.

## 2011-01-07 NOTE — Telephone Encounter (Signed)
BUN 40 Cr 1.41  12/23/10 at [facility]  --I talked with pt and she is aware of these medication changes. She will return for [facility] 01/16/11

## 2011-01-16 ENCOUNTER — Other Ambulatory Visit (INDEPENDENT_AMBULATORY_CARE_PROVIDER_SITE_OTHER): Payer: Medicare Other | Admitting: *Deleted

## 2011-01-16 DIAGNOSIS — I5022 Chronic systolic (congestive) heart failure: Secondary | ICD-10-CM

## 2011-01-16 DIAGNOSIS — I2510 Atherosclerotic heart disease of native coronary artery without angina pectoris: Secondary | ICD-10-CM

## 2011-01-16 LAB — BASIC METABOLIC PANEL
CO2: 25 mEq/L (ref 19–32)
Calcium: 9.1 mg/dL (ref 8.4–10.5)
Glucose, Bld: 114 mg/dL — ABNORMAL HIGH (ref 70–99)
Sodium: 140 mEq/L (ref 135–145)

## 2011-02-04 ENCOUNTER — Ambulatory Visit (INDEPENDENT_AMBULATORY_CARE_PROVIDER_SITE_OTHER): Payer: Medicare Other | Admitting: *Deleted

## 2011-02-04 DIAGNOSIS — I253 Aneurysm of heart: Secondary | ICD-10-CM

## 2011-02-04 DIAGNOSIS — Z7901 Long term (current) use of anticoagulants: Secondary | ICD-10-CM

## 2011-02-17 ENCOUNTER — Ambulatory Visit (INDEPENDENT_AMBULATORY_CARE_PROVIDER_SITE_OTHER): Payer: Medicare Other | Admitting: *Deleted

## 2011-02-17 DIAGNOSIS — Z7901 Long term (current) use of anticoagulants: Secondary | ICD-10-CM

## 2011-02-17 DIAGNOSIS — I253 Aneurysm of heart: Secondary | ICD-10-CM

## 2011-02-18 ENCOUNTER — Encounter: Payer: Self-pay | Admitting: Cardiology

## 2011-03-06 ENCOUNTER — Ambulatory Visit (INDEPENDENT_AMBULATORY_CARE_PROVIDER_SITE_OTHER): Payer: Medicare Other | Admitting: *Deleted

## 2011-03-06 ENCOUNTER — Encounter: Payer: Self-pay | Admitting: Internal Medicine

## 2011-03-06 ENCOUNTER — Encounter: Payer: Medicare Other | Admitting: *Deleted

## 2011-03-06 ENCOUNTER — Ambulatory Visit: Payer: Medicare Other | Admitting: Cardiology

## 2011-03-06 DIAGNOSIS — I509 Heart failure, unspecified: Secondary | ICD-10-CM

## 2011-03-06 DIAGNOSIS — I5022 Chronic systolic (congestive) heart failure: Secondary | ICD-10-CM

## 2011-03-06 DIAGNOSIS — I2589 Other forms of chronic ischemic heart disease: Secondary | ICD-10-CM

## 2011-03-06 DIAGNOSIS — Z7901 Long term (current) use of anticoagulants: Secondary | ICD-10-CM

## 2011-03-06 DIAGNOSIS — I253 Aneurysm of heart: Secondary | ICD-10-CM

## 2011-03-06 LAB — ICD DEVICE OBSERVATION
DEV-0020ICD: NEGATIVE
FVT: 0
PACEART VT: 0
RV LEAD AMPLITUDE: 9.5 mv
RV LEAD IMPEDENCE ICD: 344 Ohm
RV LEAD THRESHOLD: 3 V
TZAT-0005SLOWVT: 84 pct
TZAT-0005SLOWVT: 91 pct
TZAT-0011FASTVT: 10 ms
TZAT-0011SLOWVT: 10 ms
TZAT-0011SLOWVT: 10 ms
TZAT-0012FASTVT: 200 ms
TZAT-0012SLOWVT: 200 ms
TZAT-0013FASTVT: 1
TZAT-0013SLOWVT: 2
TZAT-0013SLOWVT: 2
TZAT-0018SLOWVT: NEGATIVE
TZAT-0018SLOWVT: NEGATIVE
TZAT-0019FASTVT: 8 V
TZAT-0019SLOWVT: 8 V
TZAT-0019SLOWVT: 8 V
TZAT-0020FASTVT: 1.6 ms
TZON-0003FASTVT: 240 ms
TZON-0004SLOWVT: 20
TZON-0005SLOWVT: 12
TZON-0008FASTVT: 0 ms
TZON-0008SLOWVT: 0 ms
TZST-0001FASTVT: 3
TZST-0001FASTVT: 4
TZST-0001FASTVT: 6
TZST-0001SLOWVT: 4
TZST-0001SLOWVT: 6
TZST-0003FASTVT: 35 J
TZST-0003FASTVT: 35 J
TZST-0003SLOWVT: 25 J
TZST-0003SLOWVT: 35 J
TZST-0003SLOWVT: 35 J
VENTRICULAR PACING ICD: 0 pct

## 2011-03-06 LAB — POCT INR: INR: 3.7

## 2011-03-06 NOTE — Progress Notes (Signed)
ICD check by industry for research 

## 2011-03-27 ENCOUNTER — Other Ambulatory Visit: Payer: Self-pay | Admitting: Cardiology

## 2011-04-01 ENCOUNTER — Encounter: Payer: Self-pay | Admitting: Cardiology

## 2011-04-01 ENCOUNTER — Ambulatory Visit (INDEPENDENT_AMBULATORY_CARE_PROVIDER_SITE_OTHER): Payer: Medicare Other | Admitting: Cardiology

## 2011-04-01 ENCOUNTER — Ambulatory Visit (INDEPENDENT_AMBULATORY_CARE_PROVIDER_SITE_OTHER): Payer: Medicare Other | Admitting: *Deleted

## 2011-04-01 DIAGNOSIS — I2510 Atherosclerotic heart disease of native coronary artery without angina pectoris: Secondary | ICD-10-CM

## 2011-04-01 DIAGNOSIS — I253 Aneurysm of heart: Secondary | ICD-10-CM

## 2011-04-01 DIAGNOSIS — I509 Heart failure, unspecified: Secondary | ICD-10-CM

## 2011-04-01 DIAGNOSIS — E785 Hyperlipidemia, unspecified: Secondary | ICD-10-CM

## 2011-04-01 DIAGNOSIS — I5022 Chronic systolic (congestive) heart failure: Secondary | ICD-10-CM

## 2011-04-01 DIAGNOSIS — Z7901 Long term (current) use of anticoagulants: Secondary | ICD-10-CM

## 2011-04-01 LAB — POCT INR: INR: 2.6

## 2011-04-01 NOTE — Patient Instructions (Signed)
Stop aspirin.  Increase losartan to 75mg  daily. This will be one and one-half 50mg  tablets daily.  Your physician recommends that you return for a FASTING lipid profile /BMET/BNP in 2 weeks--428.22 414.01  Your physician wants you to follow-up in: 4 months with Dr Shirlee Latch. (April 2013). You will receive a reminder letter in the mail two months in advance. If you don't receive a letter, please call our office to schedule the follow-up appointment.

## 2011-04-02 NOTE — Assessment & Plan Note (Signed)
Stable NYHA class II symptoms.  She is not volume overloaded on exam.  I am going to increase her losartan to 75 mg daily with BMET/BNP in 2 weeks.  She will continue Coreg and spironolactone.  Will need to follow K closely.

## 2011-04-02 NOTE — Assessment & Plan Note (Signed)
Check lipids with goal LDL < 70.   

## 2011-04-02 NOTE — Assessment & Plan Note (Signed)
Apical aneurysm with thrombus.  Continue warfarin.  No falls but some gait instability.  I talked to her about using a cane.

## 2011-04-02 NOTE — Progress Notes (Signed)
PCP: Dr. Selena Batten  78 yo with history of CAD, ischemic CMP and apical aneurysm with LV thrombus presents for cardiology followup.   Patient's main complaint continues to be low back pain. This has been chronic.  She is not very active but denies exertional dyspnea when walking around the house or going to the mailbox.  No chest pain.  No lightheadedness or falls.  She does have some balance difficulty but is not using a cane regularly.  Losartan was decreased to 50 mg daily because of a mild rise in creatinine.    Labs (12/11): K 4.1, creatinine 1.1 Labs (4/12): K 4.6, creatinine 1.2, BUN 43, BNP 271 Labs (6/12): K 4, creatinine 1.3, LDL 66, HDL 44 Labs (10/12): K 4.8, creatinine 1.3  Allergies (verified):  1)  ! Codeine 2)  ! Williams Che  Past Medical History: 1. HYPERCHOLESTEROLEMIA (ICD-272.0) 2. ARTHRITIS (ICD-716.90): Severe low back pain.  3. ISCHEMIC CARDIOMYOPATHY (ICD-414.8): Echo (1/10) with EF 25-30%, mildly dilated LV, periapical LV aneurysm, calcified LV thrombus, moderate MR.  Medtronic ICD set to VVI.  4. CAD (ICD-414.00): Anterior MI in 2006 with Cypher DES to LAD.  5. HYPERTENSION (ICD-401.9) 6. LV thrombus on coumadin.  7. Macular degeneration 8. CKD  Social History: The patient lives alone near [place]. 3 daughters live nearby. Widowed Tobacco Use - No.  Alcohol Use - no  Family History:  CAD  ROS: All systems reviewed and negative except as per HPI.    Current Outpatient Prescriptions  Medication Sig Dispense Refill  . carvedilol (COREG) 25 MG tablet TAKE ONE TABLET BY MOUTH TWICE DAILY  60 tablet  11  . cholecalciferol (VITAMIN D) 1000 UNITS tablet Take 1,000 Units by mouth daily.        Marland Kitchen ezetimibe-simvastatin (VYTORIN) 10-40 MG per tablet Take 1 tablet by mouth daily.  30 tablet  6  . furosemide (LASIX) 40 MG tablet Take 1 tablet (40 mg total) by mouth daily.  30 tablet  6  . Glucosamine Sulfate 500 MG TABS Take by mouth daily.        . Multiple Vitamin  (MULTIVITAMIN) tablet Take 1 tablet by mouth daily.        . nitroGLYCERIN (NITROSTAT) 0.4 MG SL tablet Place 0.4 mg under the tongue every 5 (five) minutes as needed.        . Omega-3 Fatty Acids (FISH OIL) 1000 MG CAPS Take by mouth daily.        . sertraline (ZOLOFT) 50 MG tablet Take 50 mg by mouth daily.        Marland Kitchen spironolactone (ALDACTONE) 25 MG tablet TAKE ONE TABLET BY MOUTH ONE TIME DAILY  30 tablet  5  . trimethoprim (TRIMPEX) 100 MG tablet Take 100 mg by mouth 1 day or 1 dose. For recurring UTIs      . warfarin (COUMADIN) 2.5 MG tablet TAKE 1 TABLET BY MOUTH ONCE A DAY OR AS INSTRUCTED BY PHYSICIAN/CLINIC  30 tablet  3  . losartan (COZAAR) 50 MG tablet Take one and one-half tablets daily at the same time        BP 129/68  Pulse 79  Resp 18  Ht 5' (1.524 m)  Wt 67.132 kg (148 lb)  BMI 28.90 kg/m2 General:  Elderly woman in no distress.  Neck:  Neck supple, no JVD. No masses, thyromegaly or abnormal cervical nodes. Lungs:  Clear bilaterally to auscultation and percussion. Heart:  Non-displaced PMI, chest non-tender; regular rate and rhythm, S1, S2 without  murmurs, rubs or gallops. Carotid upstroke normal, no bruit.  Pedals normal pulses. No edema, no varicosities. Abdomen:  Bowel sounds positive; abdomen soft and non-tender without masses, organomegaly, or hernias noted. No hepatosplenomegaly. Extremities:  No clubbing or cyanosis. Neurologic:  Alert and oriented x 3. Psych:  Normal affect.

## 2011-04-02 NOTE — Assessment & Plan Note (Signed)
Stable.  No chest pain.  Continue statin, ACEI, beta blocker.  With stable CAD, there is no advantage to the combination of ASA + warfarin over warfarin alone, and it does increase her bleeding risk.  Therefore, she will stop ASA (and continue warfarin).

## 2011-04-29 ENCOUNTER — Other Ambulatory Visit (INDEPENDENT_AMBULATORY_CARE_PROVIDER_SITE_OTHER): Payer: Medicare Other | Admitting: *Deleted

## 2011-04-29 ENCOUNTER — Ambulatory Visit (INDEPENDENT_AMBULATORY_CARE_PROVIDER_SITE_OTHER): Payer: Medicare Other | Admitting: Pharmacist

## 2011-04-29 DIAGNOSIS — Z7901 Long term (current) use of anticoagulants: Secondary | ICD-10-CM

## 2011-04-29 DIAGNOSIS — I253 Aneurysm of heart: Secondary | ICD-10-CM

## 2011-04-29 DIAGNOSIS — I5022 Chronic systolic (congestive) heart failure: Secondary | ICD-10-CM

## 2011-04-29 DIAGNOSIS — I2510 Atherosclerotic heart disease of native coronary artery without angina pectoris: Secondary | ICD-10-CM

## 2011-04-29 LAB — LIPID PANEL
Cholesterol: 125 mg/dL (ref 0–200)
HDL: 44.2 mg/dL (ref >39.00)

## 2011-04-29 LAB — BASIC METABOLIC PANEL
BUN: 47 mg/dL — ABNORMAL HIGH (ref 6–23)
Calcium: 9.4 mg/dL (ref 8.4–10.5)
GFR: 34.63 mL/min — ABNORMAL LOW (ref >60.00)
Glucose, Bld: 99 mg/dL (ref 70–99)
Potassium: 4.4 mEq/L (ref 3.5–5.1)
Sodium: 140 mEq/L (ref 135–145)

## 2011-04-29 LAB — POCT INR: INR: 1.9

## 2011-05-05 ENCOUNTER — Telehealth: Payer: Self-pay | Admitting: *Deleted

## 2011-05-05 DIAGNOSIS — I5022 Chronic systolic (congestive) heart failure: Secondary | ICD-10-CM

## 2011-05-05 NOTE — Telephone Encounter (Signed)
Notes Recorded by Jacqlyn Krauss, RN on 05/05/2011 at 4:29 PM Discussed with pt. She will decrease losartan to 50mg  daily. She will return for BMET at time of [facility] appt 05/27/11. ------  Notes Recorded by Marca Ancona, MD on 05/04/2011 at 11:59 PM Creatinine elevated, decrease losartan back to 50 mg daily with BMET in 2 wks.

## 2011-05-12 ENCOUNTER — Other Ambulatory Visit: Payer: Self-pay

## 2011-05-12 MED ORDER — NITROGLYCERIN 0.4 MG SL SUBL: 0.4000 mg | SUBLINGUAL_TABLET | SUBLINGUAL | Status: DC | PRN

## 2011-05-27 ENCOUNTER — Other Ambulatory Visit: Payer: Medicare Other

## 2011-05-27 ENCOUNTER — Ambulatory Visit (INDEPENDENT_AMBULATORY_CARE_PROVIDER_SITE_OTHER): Payer: Medicare Other | Admitting: Pharmacist

## 2011-05-27 DIAGNOSIS — Z7901 Long term (current) use of anticoagulants: Secondary | ICD-10-CM

## 2011-05-27 DIAGNOSIS — I5022 Chronic systolic (congestive) heart failure: Secondary | ICD-10-CM

## 2011-05-27 DIAGNOSIS — I253 Aneurysm of heart: Secondary | ICD-10-CM

## 2011-05-27 LAB — POCT INR: INR: 2.4

## 2011-05-29 LAB — BASIC METABOLIC PANEL
CO2: 24 mEq/L (ref 19–32)
Chloride: 103 mEq/L (ref 96–112)
Glucose, Bld: 95 mg/dL (ref 70–99)
Sodium: 139 mEq/L (ref 135–145)

## 2011-06-10 ENCOUNTER — Ambulatory Visit (INDEPENDENT_AMBULATORY_CARE_PROVIDER_SITE_OTHER): Payer: Medicare Other | Admitting: Internal Medicine

## 2011-06-10 ENCOUNTER — Encounter: Payer: Self-pay | Admitting: Internal Medicine

## 2011-06-10 VITALS — BP 118/56 | HR 68 | Ht 60.0 in | Wt 149.0 lb

## 2011-06-10 DIAGNOSIS — I472 Ventricular tachycardia, unspecified: Secondary | ICD-10-CM | POA: Insufficient documentation

## 2011-06-10 DIAGNOSIS — I2589 Other forms of chronic ischemic heart disease: Secondary | ICD-10-CM

## 2011-06-10 DIAGNOSIS — Z95810 Presence of automatic (implantable) cardiac defibrillator: Secondary | ICD-10-CM

## 2011-06-10 DIAGNOSIS — I5022 Chronic systolic (congestive) heart failure: Secondary | ICD-10-CM

## 2011-06-10 DIAGNOSIS — I509 Heart failure, unspecified: Secondary | ICD-10-CM

## 2011-06-10 LAB — ICD DEVICE OBSERVATION
BRDY-0002RV: 40 {beats}/min
CHARGE TIME: 8.32 s
RV LEAD AMPLITUDE: 15.4 mv
RV LEAD IMPEDENCE ICD: 360 Ohm
RV LEAD THRESHOLD: 1.5 V
TZAT-0001FASTVT: 1
TZAT-0005FASTVT: 88 pct
TZAT-0012SLOWVT: 200 ms
TZAT-0012SLOWVT: 200 ms
TZAT-0013FASTVT: 1
TZAT-0013SLOWVT: 2
TZAT-0013SLOWVT: 2
TZAT-0018FASTVT: NEGATIVE
TZAT-0018SLOWVT: NEGATIVE
TZAT-0019FASTVT: 8 V
TZAT-0019SLOWVT: 8 V
TZAT-0019SLOWVT: 8 V
TZAT-0020FASTVT: 1.6 ms
TZAT-0020SLOWVT: 1.6 ms
TZON-0003SLOWVT: 350 ms
TZON-0004SLOWVT: 20
TZON-0008FASTVT: 0 ms
TZON-0008SLOWVT: 0 ms
TZST-0001FASTVT: 2
TZST-0001FASTVT: 5
TZST-0001SLOWVT: 3
TZST-0001SLOWVT: 4
TZST-0001SLOWVT: 6
TZST-0003FASTVT: 35 J
TZST-0003FASTVT: 35 J
TZST-0003SLOWVT: 15 J
TZST-0003SLOWVT: 35 J
VENTRICULAR PACING ICD: 0 pct

## 2011-06-10 NOTE — Patient Instructions (Signed)
Your physician wants you to follow-up in: ONE YEAR WITH DR TAYLOR You will receive a reminder letter in the mail two months in advance. If you don't receive a letter, please call our office to schedule the follow-up appointment.  

## 2011-06-10 NOTE — Assessment & Plan Note (Signed)
She denies anginal symptoms. She will continue her current medical therapy. 

## 2011-06-10 NOTE — Assessment & Plan Note (Signed)
Her device is working normally. We'll plan to recheck in several months. 

## 2011-06-10 NOTE — Progress Notes (Signed)
HPI [name] returns today for followup. She is a very pleasant 78 year old woman with an ischemic cardiomyopathy, chronic systolic heart failure, dyslipidemia, status post ICD implantation. She denies any recent ICD shocks. No peripheral edema. She is now walking using a cane.  Allergies  Allergen Reactions  . Codeine      Current Outpatient Prescriptions  Medication Sig Dispense Refill  . carvedilol (COREG) 25 MG tablet TAKE ONE TABLET BY MOUTH TWICE DAILY  60 tablet  11  . cholecalciferol (VITAMIN D) 1000 UNITS tablet Take 1,000 Units by mouth daily.        Marland Kitchen ezetimibe-simvastatin (VYTORIN) 10-40 MG per tablet Take 1 tablet by mouth daily.  30 tablet  6  . furosemide (LASIX) 40 MG tablet Take 1 tablet (40 mg total) by mouth daily.  30 tablet  6  . Glucosamine Sulfate 500 MG TABS Take by mouth daily.        Marland Kitchen losartan (COZAAR) 50 MG tablet Take 1 tablet (50 mg total) by mouth daily.      . Multiple Vitamin (MULTIVITAMIN) tablet Take 1 tablet by mouth daily.        . nitroGLYCERIN (NITROSTAT) 0.4 MG SL tablet Place 1 tablet (0.4 mg total) under the tongue every 5 (five) minutes as needed.  30 tablet  2  . Omega-3 Fatty Acids (FISH OIL) 1000 MG CAPS Take by mouth daily.        . sertraline (ZOLOFT) 50 MG tablet Take 50 mg by mouth daily.        Marland Kitchen spironolactone (ALDACTONE) 25 MG tablet TAKE ONE TABLET BY MOUTH ONE TIME DAILY  30 tablet  5  . trimethoprim (TRIMPEX) 100 MG tablet Take 100 mg by mouth 1 day or 1 dose. For recurring UTIs      . warfarin (COUMADIN) 2.5 MG tablet TAKE 1 TABLET BY MOUTH ONCE A DAY OR AS INSTRUCTED BY PHYSICIAN/CLINIC  30 tablet  3     Past Medical History  Diagnosis Date  . Pure hypercholesterolemia   . Arthritis     severe lower back pain  . Other specified forms of chronic ischemic heart disease     echo (1/10) with EF 25-30%, mildly dilated LV, periapical LV aneueysm, calcified LV thombus, moderate MR. Medtronic ICD set to VVI.   Marland Kitchen CAD (coronary artery  disease)     anterior MI in 2006 with Cypher DES to LAD  . HTN (hypertension)   . LV (left ventricular) mural thrombus     on coumadin   . Nephrolithiasis   . CHF (congestive heart failure)   . Cardiomyopathy     ROS:   All systems reviewed and negative except as noted in the HPI.   Past Surgical History  Procedure Date  . Aicd implantation     ICD-Medtronic. Remote-yes   . Coronary angioplasty with stent placement   . Coronary angioplasty   . Cardiac catheterization      Family History  Problem Relation Age of Onset  . Breast cancer Mother   . Stroke Father   . Stroke Brother      History   Social History  . Marital Status: Widowed    Spouse Name: N/A    Number of Children: N/A  . Years of Education: N/A   Occupational History  . Not on file.   Social History Main Topics  . Smoking status: Never Smoker   . Smokeless tobacco: Never Used  . Alcohol Use: No  .  Drug Use: No  . Sexually Active: Not on file   Other Topics Concern  . Not on file   Social History Narrative   Pt is widowed and lives alone near [place]. 3 daughters live nearby     BP 118/56  Pulse 68  Ht 5' (1.524 m)  Wt 67.586 kg (149 lb)  BMI 29.10 kg/m2  Physical Exam:  Well appearing  elderly woman, NAD HEENT: Unremarkable Neck:  No JVD, no thyromegally Lungs:  Clear with no wheezes, rales, or rhonchi.  HEART:  Regular rate rhythm, no murmurs, no rubs, no clicks Abd:  soft, positive bowel sounds, no organomegally, no rebound, no guarding Ext:  2 plus pulses, no edema, no cyanosis, no clubbing Skin:  No rashes no nodules Neuro:  CN II through XII intact, motor grossly intact  DEVICE  Normal device function.  See PaceArt for details.   Assess/Plan:

## 2011-06-10 NOTE — Assessment & Plan Note (Signed)
Her current symptoms are class II. She'll continue her current medical therapy and maintain a low-sodium diet.

## 2011-06-20 ENCOUNTER — Other Ambulatory Visit: Payer: Self-pay | Admitting: Cardiology

## 2011-06-23 ENCOUNTER — Ambulatory Visit (INDEPENDENT_AMBULATORY_CARE_PROVIDER_SITE_OTHER): Payer: Medicare Other | Admitting: Pharmacist

## 2011-06-23 DIAGNOSIS — I253 Aneurysm of heart: Secondary | ICD-10-CM

## 2011-06-23 DIAGNOSIS — Z7901 Long term (current) use of anticoagulants: Secondary | ICD-10-CM

## 2011-06-23 LAB — POCT INR: INR: 2.5

## 2011-07-03 ENCOUNTER — Ambulatory Visit (INDEPENDENT_AMBULATORY_CARE_PROVIDER_SITE_OTHER): Payer: Medicare Other | Admitting: Cardiology

## 2011-07-03 ENCOUNTER — Encounter: Payer: Self-pay | Admitting: Cardiology

## 2011-07-03 VITALS — BP 152/80 | HR 64 | Resp 18 | Ht 59.0 in | Wt 151.8 lb

## 2011-07-03 DIAGNOSIS — I5022 Chronic systolic (congestive) heart failure: Secondary | ICD-10-CM

## 2011-07-03 DIAGNOSIS — I253 Aneurysm of heart: Secondary | ICD-10-CM

## 2011-07-03 DIAGNOSIS — I2510 Atherosclerotic heart disease of native coronary artery without angina pectoris: Secondary | ICD-10-CM

## 2011-07-03 DIAGNOSIS — I509 Heart failure, unspecified: Secondary | ICD-10-CM

## 2011-07-03 DIAGNOSIS — E785 Hyperlipidemia, unspecified: Secondary | ICD-10-CM

## 2011-07-03 NOTE — Patient Instructions (Signed)
Your physician recommends that you schedule a follow-up appointment in: 3 months with Dr Shirlee Latch.(July 2013).  Your physician recommends that you return for lab work in: 3 months when you see Dr Frutoso Chase 414.01  428.22

## 2011-07-03 NOTE — Assessment & Plan Note (Signed)
LDL at goal when recently checked.  

## 2011-07-03 NOTE — Assessment & Plan Note (Signed)
Stable NYHA class II-III symptoms.  She is not volume overloaded on exam.  Continue losartan at 50 mg daily.  She has not been able to increase this due to rise in creatinine.  She will also continue current doses of Coreg and spironolactone.  She should get a BMET q 3 months with spironolactone.  I will see her again in 3 months and will get BMET at that time.

## 2011-07-03 NOTE — Assessment & Plan Note (Signed)
Stable.  No chest pain.  Continue statin, ACEI, beta blocker.  With stable CAD, there is no advantage to the combination of ASA + warfarin over warfarin alone, and it does increase her bleeding risk.  She is on warfarin.

## 2011-07-03 NOTE — Assessment & Plan Note (Addendum)
Apical aneurysm with thrombus.  Continue warfarin.  No falls but some gait instability. She is using her cane.  

## 2011-07-03 NOTE — Progress Notes (Signed)
PCP: Dr. Selena Batten  78 yo with history of CAD, ischemic CMP and apical aneurysm with LV thrombus presents for cardiology followup.   Patient's main complaint continues to be low back pain. This has been chronic.  She is not very active but denies exertional dyspnea when walking around the house.  She has mild dyspnea walking to the mailbox.  No chest pain.  No lightheadedness or falls.  She does have some balance difficulty and is using her cane.  Losartan has been decreased to 50 mg daily because of a mild rise in creatinine.  BP is mildly elevated today but has been < 130 when she checks at home.   Labs (12/11): K 4.1, creatinine 1.1 Labs (4/12): K 4.6, creatinine 1.2, BUN 43, BNP 271 Labs (6/12): K 4, creatinine 1.3, LDL 66, HDL 44 Labs (10/12): K 4.8, creatinine 1.3 Labs (3/13): K 4.7, creatinine 1.4 Labs (4/13): K 4.6, creatinine 1.22, LDL 71, HDL 45  Allergies (verified):  1)  ! Codeine 2)  ! Williams Che  Past Medical History: 1. HYPERCHOLESTEROLEMIA (ICD-272.0) 2. ARTHRITIS (ICD-716.90): Severe low back pain.  3. ISCHEMIC CARDIOMYOPATHY (ICD-414.8): Echo (1/10) with EF 25-30%, mildly dilated LV, periapical LV aneurysm, calcified LV thrombus, moderate MR.  Medtronic ICD set to VVI.  4. CAD (ICD-414.00): Anterior MI in 2006 with Cypher DES to LAD.  5. HYPERTENSION (ICD-401.9) 6. LV thrombus on coumadin.  7. Macular degeneration 8. CKD  Social History: The patient lives alone near [place]. 3 daughters live nearby. Widowed Tobacco Use - No.  Alcohol Use - no  Family History:  CAD   Current Outpatient Prescriptions  Medication Sig Dispense Refill  . carvedilol (COREG) 25 MG tablet TAKE ONE TABLET BY MOUTH TWICE DAILY  60 tablet  11  . cholecalciferol (VITAMIN D) 1000 UNITS tablet Take 1,000 Units by mouth daily.        Marland Kitchen ezetimibe-simvastatin (VYTORIN) 10-40 MG per tablet Take 1 tablet by mouth daily.  30 tablet  6  . furosemide (LASIX) 40 MG tablet Take 1 tablet (40 mg total) by  mouth daily.  30 tablet  6  . Glucosamine Sulfate 500 MG TABS Take by mouth daily.        Marland Kitchen losartan (COZAAR) 50 MG tablet Take 1 tablet (50 mg total) by mouth daily.      . Multiple Vitamin (MULTIVITAMIN) tablet Take 1 tablet by mouth daily.        . nitroGLYCERIN (NITROSTAT) 0.4 MG SL tablet Place 1 tablet (0.4 mg total) under the tongue every 5 (five) minutes as needed.  30 tablet  2  . Omega-3 Fatty Acids (FISH OIL) 1000 MG CAPS Take by mouth daily.        . sertraline (ZOLOFT) 50 MG tablet Take 50 mg by mouth daily.        Marland Kitchen spironolactone (ALDACTONE) 25 MG tablet TAKE ONE TABLET BY MOUTH ONE TIME DAILY  30 tablet  5  . trimethoprim (TRIMPEX) 100 MG tablet Take 100 mg by mouth 1 day or 1 dose. For recurring UTIs      . warfarin (COUMADIN) 2.5 MG tablet Take as directed by [facility]  30 tablet  3    BP 152/80  Pulse 64  Resp 18  Ht 4\' 11"  (1.499 m)  Wt 151 lb 12.8 oz (68.856 kg)  BMI 30.66 kg/m2 General:  Elderly woman in no distress.  Neck:  Neck supple, no JVD. No masses, thyromegaly or abnormal cervical nodes. Lungs:  Clear bilaterally to auscultation and percussion. Heart:  Non-displaced PMI, chest non-tender; regular rate and rhythm, S1, S2 without murmurs, rubs or gallops. Carotid upstroke normal, no bruit.  Pedals normal pulses. 1+ edema right ankle Abdomen:  Bowel sounds positive; abdomen soft and non-tender without masses, organomegaly, or hernias noted. No hepatosplenomegaly. Extremities:  No clubbing or cyanosis. Neurologic:  Alert and oriented x 3. Psych:  Normal affect.

## 2011-07-21 ENCOUNTER — Ambulatory Visit (INDEPENDENT_AMBULATORY_CARE_PROVIDER_SITE_OTHER): Payer: Medicare Other

## 2011-07-21 DIAGNOSIS — Z7901 Long term (current) use of anticoagulants: Secondary | ICD-10-CM

## 2011-07-21 DIAGNOSIS — I253 Aneurysm of heart: Secondary | ICD-10-CM

## 2011-07-28 ENCOUNTER — Encounter: Payer: Self-pay | Admitting: Cardiology

## 2011-09-02 ENCOUNTER — Ambulatory Visit (INDEPENDENT_AMBULATORY_CARE_PROVIDER_SITE_OTHER): Payer: Medicare Other | Admitting: Pharmacist

## 2011-09-02 DIAGNOSIS — I253 Aneurysm of heart: Secondary | ICD-10-CM

## 2011-09-02 DIAGNOSIS — Z7901 Long term (current) use of anticoagulants: Secondary | ICD-10-CM

## 2011-09-11 ENCOUNTER — Encounter: Payer: Medicare Other | Admitting: *Deleted

## 2011-09-22 ENCOUNTER — Encounter: Payer: Self-pay | Admitting: *Deleted

## 2011-09-28 ENCOUNTER — Encounter: Payer: Self-pay | Admitting: Internal Medicine

## 2011-09-29 ENCOUNTER — Ambulatory Visit (INDEPENDENT_AMBULATORY_CARE_PROVIDER_SITE_OTHER): Payer: Medicare Other | Admitting: *Deleted

## 2011-09-29 DIAGNOSIS — Z95810 Presence of automatic (implantable) cardiac defibrillator: Secondary | ICD-10-CM

## 2011-09-29 DIAGNOSIS — I2589 Other forms of chronic ischemic heart disease: Secondary | ICD-10-CM

## 2011-09-29 LAB — REMOTE ICD DEVICE
BATTERY VOLTAGE: 2.89 v
BRDY-0002RV: 40 {beats}/min
CHARGE TIME: 8.32 s
DEV-0020ICD: NEGATIVE
RV LEAD AMPLITUDE: 14 mv
RV LEAD IMPEDENCE ICD: 332 Ohm
TOT-0006: 20130319000000
TZAT-0001FASTVT: 1
TZAT-0001SLOWVT: 1
TZAT-0001SLOWVT: 2
TZAT-0004FASTVT: 8
TZAT-0004SLOWVT: 8
TZAT-0004SLOWVT: 8
TZAT-0005FASTVT: 88 pct
TZAT-0005SLOWVT: 84 pct
TZAT-0005SLOWVT: 91 pct
TZAT-0011FASTVT: 10 ms
TZAT-0011SLOWVT: 10 ms
TZAT-0011SLOWVT: 10 ms
TZAT-0012FASTVT: 200 ms
TZAT-0012SLOWVT: 200 ms
TZAT-0012SLOWVT: 200 ms
TZAT-0013FASTVT: 1
TZAT-0013SLOWVT: 2
TZAT-0013SLOWVT: 2
TZAT-0018FASTVT: NEGATIVE
TZAT-0018SLOWVT: NEGATIVE
TZAT-0018SLOWVT: NEGATIVE
TZAT-0019FASTVT: 8 v
TZAT-0019SLOWVT: 8 v
TZAT-0019SLOWVT: 8 v
TZAT-0020FASTVT: 1.6 ms
TZAT-0020SLOWVT: 1.6 ms
TZAT-0020SLOWVT: 1.6 ms
TZON-0003FASTVT: 240 ms
TZON-0003SLOWVT: 350 ms
TZON-0004SLOWVT: 20
TZON-0005SLOWVT: 12
TZON-0008FASTVT: 0 ms
TZON-0008SLOWVT: 0 ms
TZON-0011AFLUTTER: 70
TZST-0001FASTVT: 2
TZST-0001FASTVT: 3
TZST-0001FASTVT: 4
TZST-0001FASTVT: 5
TZST-0001FASTVT: 6
TZST-0001SLOWVT: 3
TZST-0001SLOWVT: 4
TZST-0001SLOWVT: 5
TZST-0001SLOWVT: 6
TZST-0003FASTVT: 25 j
TZST-0003FASTVT: 35 j
TZST-0003FASTVT: 35 j
TZST-0003FASTVT: 35 j
TZST-0003FASTVT: 35 j
TZST-0003SLOWVT: 15 j
TZST-0003SLOWVT: 25 j
TZST-0003SLOWVT: 35 j
TZST-0003SLOWVT: 35 j
VENTRICULAR PACING ICD: 0 pct

## 2011-10-07 ENCOUNTER — Other Ambulatory Visit: Payer: Self-pay | Admitting: *Deleted

## 2011-10-07 ENCOUNTER — Other Ambulatory Visit (INDEPENDENT_AMBULATORY_CARE_PROVIDER_SITE_OTHER): Payer: Medicare Other

## 2011-10-07 ENCOUNTER — Emergency Department (HOSPITAL_COMMUNITY): Payer: Medicare Other

## 2011-10-07 ENCOUNTER — Ambulatory Visit (INDEPENDENT_AMBULATORY_CARE_PROVIDER_SITE_OTHER): Payer: Medicare Other | Admitting: Cardiology

## 2011-10-07 ENCOUNTER — Observation Stay (HOSPITAL_COMMUNITY)
Admission: EM | Admit: 2011-10-07 | Discharge: 2011-10-09 | Disposition: A | Discharge status: Home / Self Care | Payer: Medicare Other | Attending: Family Medicine | Admitting: Family Medicine

## 2011-10-07 ENCOUNTER — Ambulatory Visit (INDEPENDENT_AMBULATORY_CARE_PROVIDER_SITE_OTHER): Payer: Medicare Other

## 2011-10-07 ENCOUNTER — Encounter (HOSPITAL_COMMUNITY): Payer: Self-pay

## 2011-10-07 ENCOUNTER — Encounter: Payer: Self-pay | Admitting: Cardiology

## 2011-10-07 VITALS — BP 184/86 | HR 66 | Ht 59.0 in | Wt 150.1 lb

## 2011-10-07 DIAGNOSIS — I5022 Chronic systolic (congestive) heart failure: Secondary | ICD-10-CM

## 2011-10-07 DIAGNOSIS — I253 Aneurysm of heart: Secondary | ICD-10-CM

## 2011-10-07 DIAGNOSIS — I472 Ventricular tachycardia, unspecified: Secondary | ICD-10-CM

## 2011-10-07 DIAGNOSIS — I209 Angina pectoris, unspecified: Secondary | ICD-10-CM | POA: Insufficient documentation

## 2011-10-07 DIAGNOSIS — Z79899 Other long term (current) drug therapy: Secondary | ICD-10-CM | POA: Insufficient documentation

## 2011-10-07 DIAGNOSIS — I129 Hypertensive chronic kidney disease with stage 1 through stage 4 chronic kidney disease, or unspecified chronic kidney disease: Secondary | ICD-10-CM | POA: Insufficient documentation

## 2011-10-07 DIAGNOSIS — N289 Disorder of kidney and ureter, unspecified: Secondary | ICD-10-CM | POA: Insufficient documentation

## 2011-10-07 DIAGNOSIS — R0989 Other specified symptoms and signs involving the circulatory and respiratory systems: Secondary | ICD-10-CM

## 2011-10-07 DIAGNOSIS — R0602 Shortness of breath: Secondary | ICD-10-CM

## 2011-10-07 DIAGNOSIS — M549 Dorsalgia, unspecified: Secondary | ICD-10-CM | POA: Insufficient documentation

## 2011-10-07 DIAGNOSIS — I513 Intracardiac thrombosis, not elsewhere classified: Secondary | ICD-10-CM

## 2011-10-07 DIAGNOSIS — I2510 Atherosclerotic heart disease of native coronary artery without angina pectoris: Principal | ICD-10-CM

## 2011-10-07 DIAGNOSIS — I2589 Other forms of chronic ischemic heart disease: Secondary | ICD-10-CM

## 2011-10-07 DIAGNOSIS — N390 Urinary tract infection, site not specified: Secondary | ICD-10-CM

## 2011-10-07 DIAGNOSIS — E7800 Pure hypercholesterolemia, unspecified: Secondary | ICD-10-CM

## 2011-10-07 DIAGNOSIS — I509 Heart failure, unspecified: Secondary | ICD-10-CM | POA: Insufficient documentation

## 2011-10-07 DIAGNOSIS — E785 Hyperlipidemia, unspecified: Secondary | ICD-10-CM

## 2011-10-07 DIAGNOSIS — I208 Other forms of angina pectoris: Secondary | ICD-10-CM | POA: Diagnosis present

## 2011-10-07 DIAGNOSIS — I5020 Unspecified systolic (congestive) heart failure: Secondary | ICD-10-CM

## 2011-10-07 DIAGNOSIS — Z9861 Coronary angioplasty status: Secondary | ICD-10-CM | POA: Insufficient documentation

## 2011-10-07 DIAGNOSIS — R079 Chest pain, unspecified: Secondary | ICD-10-CM

## 2011-10-07 DIAGNOSIS — Z95810 Presence of automatic (implantable) cardiac defibrillator: Secondary | ICD-10-CM

## 2011-10-07 DIAGNOSIS — G8929 Other chronic pain: Secondary | ICD-10-CM | POA: Insufficient documentation

## 2011-10-07 DIAGNOSIS — I10 Essential (primary) hypertension: Secondary | ICD-10-CM

## 2011-10-07 DIAGNOSIS — I252 Old myocardial infarction: Secondary | ICD-10-CM | POA: Insufficient documentation

## 2011-10-07 DIAGNOSIS — N189 Chronic kidney disease, unspecified: Secondary | ICD-10-CM | POA: Insufficient documentation

## 2011-10-07 DIAGNOSIS — Z7901 Long term (current) use of anticoagulants: Secondary | ICD-10-CM

## 2011-10-07 DIAGNOSIS — Z7902 Long term (current) use of antithrombotics/antiplatelets: Secondary | ICD-10-CM | POA: Insufficient documentation

## 2011-10-07 DIAGNOSIS — M129 Arthropathy, unspecified: Secondary | ICD-10-CM

## 2011-10-07 DIAGNOSIS — I2089 Other forms of angina pectoris: Secondary | ICD-10-CM

## 2011-10-07 LAB — CBC WITH DIFFERENTIAL/PLATELET
Basophils Absolute: 0 10*3/uL (ref 0.0–0.1)
Basophils Relative: 0 % (ref 0–1)
Eosinophils Absolute: 0.1 10*3/uL (ref 0.0–0.7)
Eosinophils Relative: 2 % (ref 0–5)
HCT: 32.9 % — ABNORMAL LOW (ref 36.0–46.0)
Hemoglobin: 10.9 g/dL — ABNORMAL LOW (ref 12.0–15.0)
Lymphocytes Relative: 25 % (ref 12–46)
Lymphs Abs: 1.9 10*3/uL (ref 0.7–4.0)
MCH: 32.1 pg (ref 26.0–34.0)
MCHC: 33.1 g/dL (ref 30.0–36.0)
MCV: 96.8 fL (ref 78.0–100.0)
Monocytes Absolute: 0.4 10*3/uL (ref 0.1–1.0)
Monocytes Relative: 6 % (ref 3–12)
Neutro Abs: 5.1 10*3/uL (ref 1.7–7.7)
Neutrophils Relative %: 67 % (ref 43–77)
Platelets: 170 10*3/uL (ref 150–400)
RBC: 3.4 MIL/uL — ABNORMAL LOW (ref 3.87–5.11)
RDW: 12.1 % (ref 11.5–15.5)
WBC: 7.6 10*3/uL (ref 4.0–10.5)

## 2011-10-07 LAB — COMPREHENSIVE METABOLIC PANEL
ALT: 18 U/L (ref 0–35)
AST: 29 U/L (ref 0–37)
Albumin: 4.1 g/dL (ref 3.5–5.2)
Alkaline Phosphatase: 50 U/L (ref 39–117)
BUN: 51 mg/dL — ABNORMAL HIGH (ref 6–23)
CO2: 22 mEq/L (ref 19–32)
Calcium: 9.9 mg/dL (ref 8.4–10.5)
Chloride: 101 mEq/L (ref 96–112)
Creatinine, Ser: 1.67 mg/dL — ABNORMAL HIGH (ref 0.50–1.10)
GFR calc Af Amer: 31 mL/min — ABNORMAL LOW (ref >90)
GFR calc non Af Amer: 27 mL/min — ABNORMAL LOW (ref >90)
Glucose, Bld: 141 mg/dL — ABNORMAL HIGH (ref 70–99)
Potassium: 4.7 mEq/L (ref 3.5–5.1)
Sodium: 138 mEq/L (ref 135–145)
Total Bilirubin: 0.4 mg/dL (ref 0.3–1.2)
Total Protein: 7.4 g/dL (ref 6.0–8.3)

## 2011-10-07 LAB — POCT INR: INR: 1.9

## 2011-10-07 LAB — URINALYSIS, ROUTINE W REFLEX MICROSCOPIC
Bilirubin Urine: NEGATIVE
Glucose, UA: NEGATIVE mg/dL
Hgb urine dipstick: NEGATIVE
Ketones, ur: NEGATIVE mg/dL
Nitrite: POSITIVE — AB
Protein, ur: NEGATIVE mg/dL
Specific Gravity, Urine: 1.011 (ref 1.005–1.030)
Urobilinogen, UA: 0.2 mg/dL (ref 0.0–1.0)
pH: 5.5 (ref 5.0–8.0)

## 2011-10-07 LAB — PROTIME-INR: INR: 1.77 — ABNORMAL HIGH (ref 0.00–1.49)

## 2011-10-07 LAB — BASIC METABOLIC PANEL
BUN: 48 mg/dL — ABNORMAL HIGH (ref 6–23)
Calcium: 9.4 mg/dL (ref 8.4–10.5)
Chloride: 104 mEq/L (ref 96–112)
Creatinine, Ser: 1.7 mg/dL — ABNORMAL HIGH (ref 0.4–1.2)

## 2011-10-07 LAB — URINE MICROSCOPIC-ADD ON

## 2011-10-07 LAB — TROPONIN I: Troponin I: <0.3 ng/mL (ref <0.30)

## 2011-10-07 MED ORDER — ASPIRIN 81 MG PO CHEW: 324.0000 mg | CHEWABLE_TABLET | Freq: Once | ORAL | Status: DC

## 2011-10-07 MED ORDER — DEXTROSE 5 % IV SOLN
1.0000 g | Freq: Once | INTRAVENOUS | Status: AC
  Administered 2011-10-07: 1 g via INTRAVENOUS
  Filled 2011-10-07: qty 10

## 2011-10-07 NOTE — Assessment & Plan Note (Signed)
LDL near goal (< 70) in 4/13.

## 2011-10-07 NOTE — Assessment & Plan Note (Signed)
Apical aneurysm with thrombus.  Continue warfarin.  No falls but some gait instability. She is using her cane.

## 2011-10-07 NOTE — Patient Instructions (Addendum)
Your physician recommends that you have  lab work today--BMET    Your physician recommends that you schedule a follow-up appointment in: 4 months with Dr McLean.    

## 2011-10-07 NOTE — Assessment & Plan Note (Signed)
Stable.  No chest pain.  Continue statin, ARB, beta blocker.  She is on warfarin.

## 2011-10-07 NOTE — Assessment & Plan Note (Signed)
Stable NYHA class II-III symptoms.  She is not volume overloaded on exam.  Continue losartan at 50 mg daily.  She has not been able to increase this due to rise in creatinine.  She will also continue current doses of Coreg and spironolactone.  BMET today.

## 2011-10-07 NOTE — ED Notes (Signed)
Pt returned from xray

## 2011-10-07 NOTE — Progress Notes (Signed)
Patient ID: [name], female   DOB: 05/09/1925, 78 y.o.   MRN: [id number] PCP: Dr. Selena Batten  78 yo with history of CAD, ischemic CMP and apical aneurysm with LV thrombus presents for cardiology followup.   Patient's main complaint continues to be low back pain and hip pain. This has been chronic.  She is not very active but denies exertional dyspnea when walking around the house and to the mailbox.  She has mild dyspnea walking to the mailbox.  No chest pain.  No lightheadedness or falls.  She does have some balance difficulty and is using her cane.  BP is elevated today but she has not taken her medications.  She checks it daily at home and it is always < 130 systolic.   Labs (12/11): K 4.1, creatinine 1.1 Labs (4/12): K 4.6, creatinine 1.2, BUN 43, BNP 271 Labs (6/12): K 4, creatinine 1.3, LDL 66, HDL 44 Labs (10/12): K 4.8, creatinine 1.3 Labs (3/13): K 4.7, creatinine 1.4 Labs (4/13): K 4.6, creatinine 1.22, LDL 71, HDL 45  Allergies (verified):  1)  ! Codeine 2)  ! Williams Che  Past Medical History: 1. HYPERCHOLESTEROLEMIA (ICD-272.0) 2. ARTHRITIS (ICD-716.90): Severe low back pain.  3. ISCHEMIC CARDIOMYOPATHY (ICD-414.8): Echo (1/10) with EF 25-30%, mildly dilated LV, periapical LV aneurysm, calcified LV thrombus, moderate MR.  Medtronic ICD set to VVI.  4. CAD (ICD-414.00): Anterior MI in 2006 with Cypher DES to LAD.  5. HYPERTENSION (ICD-401.9) 6. LV thrombus on coumadin.  7. Macular degeneration 8. CKD  Social History: The patient lives alone near [place]. 3 daughters live nearby. Widowed Tobacco Use - No.  Alcohol Use - no  Family History:  CAD   Current Outpatient Prescriptions  Medication Sig Dispense Refill  . carvedilol (COREG) 25 MG tablet TAKE ONE TABLET BY MOUTH TWICE DAILY  60 tablet  11  . cholecalciferol (VITAMIN D) 1000 UNITS tablet Take 1,000 Units by mouth daily.        Marland Kitchen ezetimibe-simvastatin (VYTORIN) 10-40 MG per tablet Take 1 tablet by mouth daily.  30  tablet  6  . furosemide (LASIX) 40 MG tablet Take 1 tablet (40 mg total) by mouth daily.  30 tablet  6  . Glucosamine Sulfate 500 MG TABS Take by mouth daily.        Marland Kitchen losartan (COZAAR) 50 MG tablet Take 1 tablet (50 mg total) by mouth daily.      . Multiple Vitamin (MULTIVITAMIN) tablet Take 1 tablet by mouth daily.        . nitroGLYCERIN (NITROSTAT) 0.4 MG SL tablet Place 1 tablet (0.4 mg total) under the tongue every 5 (five) minutes as needed.  30 tablet  2  . Omega-3 Fatty Acids (FISH OIL) 1000 MG CAPS Take by mouth daily.        . sertraline (ZOLOFT) 50 MG tablet Take 50 mg by mouth daily.        Marland Kitchen spironolactone (ALDACTONE) 25 MG tablet TAKE ONE TABLET BY MOUTH ONE TIME DAILY  30 tablet  5  . trimethoprim (TRIMPEX) 100 MG tablet Take 100 mg by mouth 1 day or 1 dose. For recurring UTIs      . warfarin (COUMADIN) 2.5 MG tablet Take as directed by [facility]  30 tablet  3    BP 184/86  Pulse 66  Ht 4\' 11"  (1.499 m)  Wt 150 lb 1.9 oz (68.094 kg)  BMI 30.32 kg/m2 General:  Elderly woman in no distress.  Neck:  Neck supple, no JVD. No masses, thyromegaly or abnormal cervical nodes. Lungs:  Clear bilaterally to auscultation and percussion. Heart:  Non-displaced PMI, chest non-tender; regular rate and rhythm, S1, S2 without murmurs, rubs or gallops. Carotid upstroke normal, no bruit.  Pedals normal pulses. No edema.  Abdomen:  Bowel sounds positive; abdomen soft and non-tender without masses, organomegaly, or hernias noted. No hepatosplenomegaly. Extremities:  No clubbing or cyanosis. Neurologic:  Alert and oriented x 3. Psych:  Normal affect.

## 2011-10-08 ENCOUNTER — Encounter (HOSPITAL_COMMUNITY): Payer: Self-pay | Admitting: Internal Medicine

## 2011-10-08 DIAGNOSIS — N390 Urinary tract infection, site not specified: Secondary | ICD-10-CM

## 2011-10-08 DIAGNOSIS — I209 Angina pectoris, unspecified: Secondary | ICD-10-CM

## 2011-10-08 DIAGNOSIS — R079 Chest pain, unspecified: Secondary | ICD-10-CM

## 2011-10-08 DIAGNOSIS — I208 Other forms of angina pectoris: Secondary | ICD-10-CM | POA: Diagnosis present

## 2011-10-08 DIAGNOSIS — E7800 Pure hypercholesterolemia, unspecified: Secondary | ICD-10-CM

## 2011-10-08 DIAGNOSIS — I10 Essential (primary) hypertension: Secondary | ICD-10-CM

## 2011-10-08 LAB — CARDIAC PANEL(CRET KIN+CKTOT+MB+TROPI)
CK, MB: 5.2 ng/mL — ABNORMAL HIGH (ref 0.3–4.0)
Relative Index: 2.4 (ref 0.0–2.5)
Relative Index: 2.3 (ref 0.0–2.5)
Total CK: 221 U/L — ABNORMAL HIGH (ref 7–177)
Total CK: 227 U/L — ABNORMAL HIGH (ref 7–177)
Troponin I: <0.3 ng/mL (ref <0.30)
Troponin I: <0.3 ng/mL (ref <0.30)

## 2011-10-08 LAB — CBC
Hemoglobin: 10.8 g/dL — ABNORMAL LOW (ref 12.0–15.0)
Platelets: 171 10*3/uL (ref 150–400)
RBC: 3.34 MIL/uL — ABNORMAL LOW (ref 3.87–5.11)
WBC: 6.3 10*3/uL (ref 4.0–10.5)

## 2011-10-08 LAB — PRO B NATRIURETIC PEPTIDE: Pro B Natriuretic peptide (BNP): 1207 pg/mL — ABNORMAL HIGH (ref 0–450)

## 2011-10-08 LAB — BASIC METABOLIC PANEL
BUN: 49 mg/dL — ABNORMAL HIGH (ref 6–23)
Calcium: 10.2 mg/dL (ref 8.4–10.5)
Chloride: 99 mEq/L (ref 96–112)
Creatinine, Ser: 1.51 mg/dL — ABNORMAL HIGH (ref 0.50–1.10)
GFR calc Af Amer: 35 mL/min — ABNORMAL LOW (ref >90)
GFR calc non Af Amer: 31 mL/min — ABNORMAL LOW (ref >90)

## 2011-10-08 LAB — PROTIME-INR
INR: 1.79 — ABNORMAL HIGH (ref 0.00–1.49)
Prothrombin Time: 21.1 seconds — ABNORMAL HIGH (ref 11.6–15.2)

## 2011-10-08 LAB — TSH: TSH: 1.12 u[IU]/mL (ref 0.350–4.500)

## 2011-10-08 MED ORDER — MORPHINE SULFATE 2 MG/ML IJ SOLN: 1.0000 mg | INTRAMUSCULAR | Status: DC | PRN

## 2011-10-08 MED ORDER — VITAMIN D3 25 MCG (1000 UNIT) PO TABS
1000.0000 [IU] | ORAL_TABLET | Freq: Every day | ORAL | Status: DC
  Administered 2011-10-08 – 2011-10-09 (×2): 1000 [IU] via ORAL
  Filled 2011-10-08 (×2): qty 1

## 2011-10-08 MED ORDER — NITROGLYCERIN 0.4 MG SL SUBL: 0.4000 mg | SUBLINGUAL_TABLET | SUBLINGUAL | Status: DC | PRN

## 2011-10-08 MED ORDER — DOCUSATE SODIUM 100 MG PO CAPS
100.0000 mg | ORAL_CAPSULE | Freq: Two times a day (BID) | ORAL | Status: DC
  Administered 2011-10-08 – 2011-10-09 (×4): 100 mg via ORAL
  Filled 2011-10-08 (×5): qty 1

## 2011-10-08 MED ORDER — ADULT MULTIVITAMIN W/MINERALS CH
1.0000 | ORAL_TABLET | Freq: Every day | ORAL | Status: DC
  Administered 2011-10-08 – 2011-10-09 (×2): 1 via ORAL
  Filled 2011-10-08 (×2): qty 1

## 2011-10-08 MED ORDER — SODIUM CHLORIDE 0.9 % IJ SOLN
3.0000 mL | Freq: Two times a day (BID) | INTRAMUSCULAR | Status: DC
  Administered 2011-10-08 (×2): 3 mL via INTRAVENOUS

## 2011-10-08 MED ORDER — SODIUM CHLORIDE 0.9 % IV SOLN: 250.0000 mL | INTRAVENOUS | Status: DC | PRN

## 2011-10-08 MED ORDER — ACETAMINOPHEN 650 MG RE SUPP: 650.0000 mg | Freq: Four times a day (QID) | RECTAL | Status: DC | PRN

## 2011-10-08 MED ORDER — SODIUM CHLORIDE 0.9 % IJ SOLN: 3.0000 mL | INTRAMUSCULAR | Status: DC | PRN

## 2011-10-08 MED ORDER — LOSARTAN POTASSIUM 50 MG PO TABS
50.0000 mg | ORAL_TABLET | Freq: Every day | ORAL | Status: DC
  Administered 2011-10-08 – 2011-10-09 (×2): 50 mg via ORAL
  Filled 2011-10-08 (×2): qty 1

## 2011-10-08 MED ORDER — SERTRALINE HCL 50 MG PO TABS
50.0000 mg | ORAL_TABLET | Freq: Every day | ORAL | Status: DC
  Administered 2011-10-08 – 2011-10-09 (×2): 50 mg via ORAL
  Filled 2011-10-08 (×2): qty 1

## 2011-10-08 MED ORDER — WARFARIN SODIUM 4 MG PO TABS
4.0000 mg | ORAL_TABLET | Freq: Once | ORAL | Status: AC
  Administered 2011-10-08: 4 mg via ORAL
  Filled 2011-10-08: qty 1

## 2011-10-08 MED ORDER — ONDANSETRON HCL 4 MG PO TABS: 4.0000 mg | ORAL_TABLET | Freq: Four times a day (QID) | ORAL | Status: DC | PRN

## 2011-10-08 MED ORDER — SPIRONOLACTONE 25 MG PO TABS
25.0000 mg | ORAL_TABLET | Freq: Every day | ORAL | Status: DC
  Administered 2011-10-08 – 2011-10-09 (×2): 25 mg via ORAL
  Filled 2011-10-08 (×2): qty 1

## 2011-10-08 MED ORDER — ENOXAPARIN SODIUM 30 MG/0.3ML ~~LOC~~ SOLN
30.0000 mg | SUBCUTANEOUS | Status: DC
  Administered 2011-10-08: 30 mg via SUBCUTANEOUS
  Filled 2011-10-08 (×2): qty 0.3

## 2011-10-08 MED ORDER — WARFARIN SODIUM 2.5 MG PO TABS
2.5000 mg | ORAL_TABLET | Freq: Once | ORAL | Status: DC
  Filled 2011-10-08: qty 1

## 2011-10-08 MED ORDER — FUROSEMIDE 20 MG PO TABS
20.0000 mg | ORAL_TABLET | Freq: Every day | ORAL | Status: DC
  Administered 2011-10-08: 20 mg via ORAL
  Filled 2011-10-08 (×2): qty 1

## 2011-10-08 MED ORDER — ONDANSETRON HCL 4 MG/2ML IJ SOLN: 4.0000 mg | Freq: Four times a day (QID) | INTRAMUSCULAR | Status: DC | PRN

## 2011-10-08 MED ORDER — TRIMETHOPRIM 100 MG PO TABS
100.0000 mg | ORAL_TABLET | Freq: Every day | ORAL | Status: DC
  Administered 2011-10-08 – 2011-10-09 (×2): 100 mg via ORAL
  Filled 2011-10-08 (×2): qty 1

## 2011-10-08 MED ORDER — CEFTRIAXONE SODIUM 1 G IJ SOLR
1.0000 g | INTRAMUSCULAR | Status: DC
  Administered 2011-10-08: 1 g via INTRAVENOUS
  Filled 2011-10-08 (×2): qty 10

## 2011-10-08 MED ORDER — WARFARIN - PHARMACIST DOSING INPATIENT: Freq: Every day | Status: DC

## 2011-10-08 MED ORDER — OMEGA-3-ACID ETHYL ESTERS 1 G PO CAPS
1.0000 g | ORAL_CAPSULE | Freq: Every day | ORAL | Status: DC
  Administered 2011-10-08 – 2011-10-09 (×2): 1 g via ORAL
  Filled 2011-10-08 (×2): qty 1

## 2011-10-08 MED ORDER — CARVEDILOL 25 MG PO TABS
25.0000 mg | ORAL_TABLET | Freq: Two times a day (BID) | ORAL | Status: DC
  Administered 2011-10-08 – 2011-10-09 (×3): 25 mg via ORAL
  Filled 2011-10-08 (×6): qty 1

## 2011-10-08 MED ORDER — ENOXAPARIN SODIUM 40 MG/0.4ML ~~LOC~~ SOLN: 40.0000 mg | SUBCUTANEOUS | Status: DC

## 2011-10-08 MED ORDER — ASPIRIN EC 81 MG PO TBEC
81.0000 mg | DELAYED_RELEASE_TABLET | Freq: Every day | ORAL | Status: DC
  Administered 2011-10-08 – 2011-10-09 (×2): 81 mg via ORAL
  Filled 2011-10-08 (×2): qty 1

## 2011-10-08 MED ORDER — ACETAMINOPHEN 325 MG PO TABS
650.0000 mg | ORAL_TABLET | Freq: Four times a day (QID) | ORAL | Status: DC | PRN
  Administered 2011-10-08: 650 mg via ORAL
  Filled 2011-10-08: qty 2

## 2011-10-08 MED ORDER — EZETIMIBE-SIMVASTATIN 10-40 MG PO TABS
1.0000 | ORAL_TABLET | Freq: Every day | ORAL | Status: DC
  Administered 2011-10-08 – 2011-10-09 (×2): 1 via ORAL
  Filled 2011-10-08 (×2): qty 1

## 2011-10-08 NOTE — H&P (Signed)
[name] is an 78 y.o. female.   Chief Complaint: Chest pain HPI: An 78 year old female with known history of coronary artery disease who has been stable coming in with sudden onset of chest pain. Patient was seen today by her cardiologist at the time she was doing fine. When she got home she started feeling chest pain. Pain was rated as 4/10 centrally located. No radiation. She took a nitroglycerin and was brought to the emergency room. Her chest pain has since resolved after arriving in the emergency room. She was however noted to have blood pressure of 220/110 on arrival. Patient also reported some dysuria and frequency. Her urinalysis showed evidence of urinary tract infection. She has known ischemic cardiomyopathy with ejection fraction of about 25-30%. She however denies any shortness of breath or any symptoms of fluid overload. She denied fever or chills. No nausea vomiting or diarrhea. Patient believes she's back to her baseline however with her known history of coronary artery disease and symptoms of chest pain she is being admitted for observation and ruling out acute coronary syndrome.  Past Medical History  Diagnosis Date  . Pure hypercholesterolemia   . Arthritis     severe lower back pain  . Other specified forms of chronic ischemic heart disease     echo (1/10) with EF 25-30%, mildly dilated LV, periapical LV aneueysm, calcified LV thombus, moderate MR. Medtronic ICD set to VVI.   Marland Kitchen CAD (coronary artery disease)     anterior MI in 2006 with Cypher DES to LAD  . HTN (hypertension)   . LV (left ventricular) mural thrombus     on coumadin   . Nephrolithiasis   . CHF (congestive heart failure)   . Cardiomyopathy     Past Surgical History  Procedure Date  . Aicd implantation     ICD-Medtronic. Remote-yes   . Coronary angioplasty with stent placement   . Coronary angioplasty   . Cardiac catheterization     Family History  Problem Relation Age of Onset  . Breast cancer  Mother   . Stroke Father   . Stroke Brother    Social History:  reports that she has never smoked. She has never used smokeless tobacco. She reports that she does not drink alcohol or use illicit drugs.  Allergies:  Allergies  Allergen Reactions  . Codeine     Medications Prior to Admission  Medication Sig Dispense Refill  . carvedilol (COREG) 25 MG tablet Take 25 mg by mouth 2 (two) times daily with a meal.      . cholecalciferol (VITAMIN D) 1000 UNITS tablet Take 1,000 Units by mouth daily.        Marland Kitchen ezetimibe-simvastatin (VYTORIN) 10-40 MG per tablet Take 1 tablet by mouth daily.  30 tablet  6  . furosemide (LASIX) 20 MG tablet Take 20 mg by mouth daily.       . Glucosamine Sulfate 500 MG TABS Take by mouth daily.        Marland Kitchen losartan (COZAAR) 50 MG tablet Take 1 tablet (50 mg total) by mouth daily.      . Multiple Vitamin (MULTIVITAMIN) tablet Take 1 tablet by mouth daily.        . nitroGLYCERIN (NITROSTAT) 0.4 MG SL tablet Place 1 tablet (0.4 mg total) under the tongue every 5 (five) minutes as needed.  30 tablet  2  . Omega-3 Fatty Acids (FISH OIL) 1000 MG CAPS Take by mouth daily.        Marland Kitchen  sertraline (ZOLOFT) 50 MG tablet Take 50 mg by mouth daily.       Marland Kitchen spironolactone (ALDACTONE) 25 MG tablet Take 25 mg by mouth daily.      Marland Kitchen trimethoprim (TRIMPEX) 100 MG tablet Take 100 mg by mouth daily. For recurring UTIs      . warfarin (COUMADIN) 2.5 MG tablet Take 1.25-2.5 mg by mouth daily. Take 1 tab on Tuesday, Thursday, and Saturday. Then take 1/2 tab all other days        Results for orders placed during the hospital encounter of 10/07/11 (from the past 48 hour(s))  CBC WITH DIFFERENTIAL     Status: Abnormal   Collection Time   10/07/11  8:10 PM      Component Value Range Comment   WBC 7.6  4.0 - 10.5 K/uL    RBC 3.40 (*) 3.87 - 5.11 MIL/uL    Hemoglobin 10.9 (*) 12.0 - 15.0 g/dL    HCT 16.1 (*) 09.6 - 46.0 %    MCV 96.8  78.0 - 100.0 fL    MCH 32.1  26.0 - 34.0 pg    MCHC 33.1   30.0 - 36.0 g/dL    RDW 04.5  40.9 - 81.1 %    Platelets 170  150 - 400 K/uL    Neutrophils Relative 67  43 - 77 %    Neutro Abs 5.1  1.7 - 7.7 K/uL    Lymphocytes Relative 25  12 - 46 %    Lymphs Abs 1.9  0.7 - 4.0 K/uL    Monocytes Relative 6  3 - 12 %    Monocytes Absolute 0.4  0.1 - 1.0 K/uL    Eosinophils Relative 2  0 - 5 %    Eosinophils Absolute 0.1  0.0 - 0.7 K/uL    Basophils Relative 0  0 - 1 %    Basophils Absolute 0.0  0.0 - 0.1 K/uL   COMPREHENSIVE METABOLIC PANEL     Status: Abnormal   Collection Time   10/07/11  8:10 PM      Component Value Range Comment   Sodium 138  135 - 145 mEq/L    Potassium 4.7  3.5 - 5.1 mEq/L    Chloride 101  96 - 112 mEq/L    CO2 22  19 - 32 mEq/L    Glucose, Bld 141 (*) 70 - 99 mg/dL    BUN 51 (*) 6 - 23 mg/dL    Creatinine, Ser 9.14 (*) 0.50 - 1.10 mg/dL    Calcium 9.9  8.4 - 78.2 mg/dL    Total Protein 7.4  6.0 - 8.3 g/dL    Albumin 4.1  3.5 - 5.2 g/dL    AST 29  0 - 37 U/L    ALT 18  0 - 35 U/L    Alkaline Phosphatase 50  39 - 117 U/L    Total Bilirubin 0.4  0.3 - 1.2 mg/dL    GFR calc non Af Amer 27 (*) >90 mL/min    GFR calc Af Amer 31 (*) >90 mL/min   TROPONIN I     Status: Normal   Collection Time   10/07/11  8:10 PM      Component Value Range Comment   Troponin I <0.30  <0.30 ng/mL   URINALYSIS, ROUTINE W REFLEX MICROSCOPIC     Status: Abnormal   Collection Time   10/07/11  8:57 PM      Component Value Range Comment  Color, Urine ORANGE (*) YELLOW BIOCHEMICALS MAY BE AFFECTED BY COLOR   APPearance CLEAR  CLEAR    Specific Gravity, Urine 1.011  1.005 - 1.030    pH 5.5  5.0 - 8.0    Glucose, UA NEGATIVE  NEGATIVE mg/dL    Hgb urine dipstick NEGATIVE  NEGATIVE    Bilirubin Urine NEGATIVE  NEGATIVE    Ketones, ur NEGATIVE  NEGATIVE mg/dL    Protein, ur NEGATIVE  NEGATIVE mg/dL    Urobilinogen, UA 0.2  0.0 - 1.0 mg/dL    Nitrite POSITIVE (*) NEGATIVE    Leukocytes, UA MODERATE (*) NEGATIVE   URINE MICROSCOPIC-ADD ON      Status: Abnormal   Collection Time   10/07/11  8:57 PM      Component Value Range Comment   Squamous Epithelial / LPF RARE  RARE    WBC, UA 3-6  <3 WBC/hpf    Bacteria, UA MANY (*) RARE    Casts HYALINE CASTS (*) NEGATIVE   PROTIME-INR     Status: Abnormal   Collection Time   10/07/11 10:31 PM      Component Value Range Comment   Prothrombin Time 20.9 (*) 11.6 - 15.2 seconds    INR 1.77 (*) 0.00 - 1.49   CARDIAC PANEL(CRET KIN+CKTOT+MB+TROPI)     Status: Abnormal   Collection Time   10/08/11  1:43 AM      Component Value Range Comment   Total CK 221 (*) 7 - 177 U/L    CK, MB 5.2 (*) 0.3 - 4.0 ng/mL    Troponin I <0.30  <0.30 ng/mL    Relative Index 2.4  0.0 - 2.5   PRO B NATRIURETIC PEPTIDE     Status: Abnormal   Collection Time   10/08/11  1:43 AM      Component Value Range Comment   Pro B Natriuretic peptide (BNP) 1207.0 (*) 0 - 450 pg/mL   CBC     Status: Abnormal   Collection Time   10/08/11  1:44 AM      Component Value Range Comment   WBC 6.3  4.0 - 10.5 K/uL    RBC 3.34 (*) 3.87 - 5.11 MIL/uL    Hemoglobin 10.8 (*) 12.0 - 15.0 g/dL    HCT 16.1 (*) 09.6 - 46.0 %    MCV 97.0  78.0 - 100.0 fL    MCH 32.3  26.0 - 34.0 pg    MCHC 33.3  30.0 - 36.0 g/dL    RDW 04.5  40.9 - 81.1 %    Platelets 171  150 - 400 K/uL   BASIC METABOLIC PANEL     Status: Abnormal   Collection Time   10/08/11  2:00 AM      Component Value Range Comment   Sodium 137  135 - 145 mEq/L    Potassium 4.0  3.5 - 5.1 mEq/L    Chloride 99  96 - 112 mEq/L    CO2 23  19 - 32 mEq/L    Glucose, Bld 174 (*) 70 - 99 mg/dL    BUN 49 (*) 6 - 23 mg/dL    Creatinine, Ser 9.14 (*) 0.50 - 1.10 mg/dL    Calcium 78.2  8.4 - 10.5 mg/dL    GFR calc non Af Amer 31 (*) >90 mL/min    GFR calc Af Amer 35 (*) >90 mL/min    Dg Chest 2 View  10/07/2011  *RADIOLOGY REPORT*  Clinical Data: Chest pain,  shortness of breath, dizziness and hypertension.  CHEST - 2 VIEW  Comparison: 03/18/2009  Findings: Stable cardiomegaly and  appearance of pacing / ICD device.  No evidence of pulmonary edema or pleural fluid.  Stable bibasilar scarring.  No focal consolidation.  Stable degenerative changes of the thoracic spine.  IMPRESSION: Stable cardiomegaly.  No acute findings.  Original Report Authenticated By: Reola Calkins, M.D.    Review of Systems  Constitutional: Negative.   HENT: Negative.   Eyes: Negative.   Cardiovascular: Positive for chest pain.  Gastrointestinal: Negative.   Genitourinary: Positive for dysuria and frequency.  Musculoskeletal: Negative.   Skin: Negative.   Neurological: Negative.   Endo/Heme/Allergies: Negative.   Psychiatric/Behavioral: Negative.     Blood pressure 122/60, pulse 63, temperature 98.1 F (36.7 C), temperature source Oral, resp. rate 21, height 4\' 10"  (1.473 m), weight 67.858 kg (149 lb 9.6 oz), SpO2 92.00%. Physical Exam  Constitutional: She is oriented to person, place, and time. She appears well-developed and well-nourished.  HENT:  Head: Normocephalic and atraumatic.  Right Ear: External ear normal.  Left Ear: External ear normal.  Nose: Nose normal.  Mouth/Throat: Oropharynx is clear and moist.  Eyes: Conjunctivae and EOM are normal. Pupils are equal, round, and reactive to light.  Neck: Normal range of motion. Neck supple.  Cardiovascular: Normal rate, regular rhythm, normal heart sounds and intact distal pulses.   Respiratory: Effort normal and breath sounds normal.  GI: Soft.  Musculoskeletal: Normal range of motion.  Neurological: She is alert and oriented to person, place, and time. She has normal reflexes.  Skin: Skin is warm and dry.  Psychiatric: She has a normal mood and affect. Her behavior is normal. Judgment and thought content normal.     Assessment/Plan And 78 year old female who was history of coronary artery disease he does chest pain. Also this seems to have UTI.  Plan #1 chest pain: Most likely this reflects angina. We will admit her for  observation check serial cardiac enzymes repeat EKG which is normal at the moment. Because of this and negative will schedule patient to follow up with cardiology for father workup including possible stress test.  Plan #2 UTI: We will send it for urine culture and sensitivity as well as blood cultures. In the meantime empirically we'll start her on Rocephin IV. Once we get sensitivities back we'll narrow down the antibiotics choice.  Plan #3 ischemic cardiomyopathy: The patient seems to be compensated at this point. We will continue his home therapy and avoid fluid overload.  Plan #4 hypertension: Blood pressure is coming down now with medication in the ER. Add nitroglycerin it and clamping her blood pressure to his inability.  Plan #5 hyperlipidemia: Continue with her statin.  GARBA,LAWAL 10/08/2011, 4:59 AM

## 2011-10-08 NOTE — Progress Notes (Signed)
ANTICOAGULATION CONSULT NOTE - Initial Consult  Pharmacy Consult for Coumadin Indication: Apical aneurysm/LV thrombus  Allergies  Allergen Reactions  . Codeine     Patient Measurements:    Vital Signs: Temp: 98.5 F (36.9 C) (07/16 1939) Temp src: Oral (07/16 1939) BP: 106/69 mmHg (07/16 2225) Pulse Rate: 64  (07/16 2100)  Labs:  Basename 10/07/11 2231 10/07/11 2010 10/07/11 1032 10/07/11 1024  HGB -- 10.9* -- --  HCT -- 32.9* -- --  PLT -- 170 -- --  APTT -- -- -- --  LABPROT 20.9* -- -- --  INR 1.77* -- -- 1.9  HEPARINUNFRC -- -- -- --  CREATININE -- 1.67* 1.7* --  CKTOTAL -- -- -- --  CKMB -- -- -- --  TROPONINI -- <0.30 -- --    The CrCl is unknown because both a height and weight (above a minimum accepted value) are required for this calculation.   Medical History: Past Medical History  Diagnosis Date  . Pure hypercholesterolemia   . Arthritis     severe lower back pain  . Other specified forms of chronic ischemic heart disease     echo (1/10) with EF 25-30%, mildly dilated LV, periapical LV aneueysm, calcified LV thombus, moderate MR. Medtronic ICD set to VVI.   Marland Kitchen CAD (coronary artery disease)     anterior MI in 2006 with Cypher DES to LAD  . HTN (hypertension)   . LV (left ventricular) mural thrombus     on coumadin   . Nephrolithiasis   . CHF (congestive heart failure)   . Cardiomyopathy     Medications:  Coreg  Vitamin D  Vytorin  Lasix  Cozaar  MVI  Ntg  Lovaza  Zoloft  Aldactone  Trimethoprim Coumadin 2.5 mg TTSat  1.25 mg MWFSun  Assessment: 78 yo female admitted with chest pain, h/o LV thrombus, to continue anticoagulation.  INR subtherapeutic on admission and DVT prophylaxis with Lovenox ordered.  Will discontinue Lovenox once INR > 2.   Goal of Therapy:  INR 2-3 Monitor platelets by anticoagulation protocol: Yes   Plan:  Coumadin 4 mg po tonight. Daily INR  Malay Fantroy, Gary Fleet 10/08/2011,12:39 AM

## 2011-10-08 NOTE — Progress Notes (Signed)
[facility] PROGRESS NOTE  [name] ZOX:[id number] DOB: Jun 16, 1925 [name]: 10/07/2011 PCP: Pearson Grippe, MD  Assessment/Plan: Principal Problem:  *Angina at rest Active Problems:  HYPERLIPIDEMIA  HYPERTENSION  Systolic CHF, chronic  UTI (lower urinary tract infection)  Plan #1 chest pain: No further episodes of CP. 2decho results pending. CE neg to date. EKG unchanged. Continue to monitor on tele.  Plan #2 UTI: Urine culture and sensitivity pending. Continue  Rocephin IV. Afebrile, non-toxic appearing.  Once we get sensitivities back we'll narrow down the antibiotics choice.   Plan #3 Acute on chronic renal insufficiency. Improving. Creatinine on admission 1.7. Currently 1.5. In 3/13 1.4 and 1 year ago 1.1. Likely related to meds. Will monitor trend if it does not continue to improve will adjust med.   Plan #4 ischemic cardiomyopathy: The patient seems to be compensated at this point. We will continue  home therapy and avoid fluid overload. Volume status currently balanced. Will monitor  Plan #5 hypertension: Blood pressure is coming down now with medication in the ER. SBP range 122-134.  Continue home lasix, BB. Plan #6 hyperlipidemia: Continue with her statin Plan #7 LV thrombus on coumadin Planc #8 Chronic low back pain. At baseline. Continue home meds. Request PT eval.   Code Status:  Family Communication: patient at bedside Disposition Plan: to be determined. Currently lives at home alone. Request PT/OT eva   Brief narrative: And 78 year old female who was history of coronary artery disease  Admitted for CP. Also this seems to have UTI.      Consultants:    Procedures:    Antibiotics:  Rocephin 10/08/11  HPI/Subjective: Denies CP and reports "i am ready to go home". No events during night  Objective: Filed Vitals:   10/07/11 2225 10/08/11 0044 10/08/11 0132 10/08/11 0603  BP: 106/69 135/67 122/60 126/71  Pulse:   63 64  Temp:  97.8 F (36.6 C) 98.1  F (36.7 C) 98.1 F (36.7 C)  TempSrc:  Oral Oral Oral  Resp:    18  Height:   4\' 10"  (1.473 m)   Weight:   67.858 kg (149 lb 9.6 oz) 67.767 kg (149 lb 6.4 oz)  SpO2: 96% 96% 92% 92%    Intake/Output Summary (Last 24 hours) at 10/08/11 1035 Last data filed at 10/08/11 0816  Gross per 24 hour  Intake    523 ml  Output    525 ml  Net     -2 ml    Exam:   General:  Awake, alert oriented x3.   Cardiovascular: RRR, No MGR No LEE PPP  Respiratory: normal effort, BSCTAB No wheeze, no rhonchi. Somewhat diminished rt base  Abdomen: obese, soft +BS Non-tender to palpation  Data Reviewed: Basic Metabolic Panel:  Lab 10/08/11 8119 10/07/11 2010 10/07/11 1032  NA 137 138 138  K 4.0 4.7 4.4  CL 99 101 104  CO2 23 22 27   GLUCOSE 174* 141* 118*  BUN 49* 51* 48*  CREATININE 1.51* 1.67* 1.7*  CALCIUM 10.2 9.9 9.4  MG -- -- --  PHOS -- -- --   Liver Function Tests:  Lab 10/07/11 2010  AST 29  ALT 18  ALKPHOS 50  BILITOT 0.4  PROT 7.4  ALBUMIN 4.1   No results found for this basename: LIPASE:5,AMYLASE:5 in the last 168 hours No results found for this basename: AMMONIA:5 in the last 168 hours CBC:  Lab 10/08/11 0144 10/07/11 2010  WBC 6.3 7.6  NEUTROABS -- 5.1  HGB  10.8* 10.9*  HCT 32.4* 32.9*  MCV 97.0 96.8  PLT 171 170   Cardiac Enzymes:  Lab 10/08/11 0900 10/08/11 0143 10/07/11 2010  CKTOTAL 227* 221* --  CKMB 5.3* 5.2* --  CKMBINDEX -- -- --  TROPONINI <0.30 <0.30 <0.30   BNP (last 3 results)  Basename 10/08/11 0143 04/29/11 1033 12/10/10 1548  PROBNP 1207.0* 227.0* 170.0*   CBG: No results found for this basename: GLUCAP:5 in the last 168 hours  No results found for this or any previous visit (from the past 240 hour(s)).   Studies: Dg Chest 2 View  10/07/2011  *RADIOLOGY REPORT*  Clinical Data: Chest pain, shortness of breath, dizziness and hypertension.  CHEST - 2 VIEW  Comparison: 03/18/2009  Findings: Stable cardiomegaly and appearance of  pacing / ICD device.  No evidence of pulmonary edema or pleural fluid.  Stable bibasilar scarring.  No focal consolidation.  Stable degenerative changes of the thoracic spine.  IMPRESSION: Stable cardiomegaly.  No acute findings.  Original Report Authenticated By: Reola Calkins, M.D.    Scheduled Meds:   . aspirin  324 mg Oral Once  . aspirin EC  81 mg Oral Daily  . carvedilol  25 mg Oral BID WC  . cefTRIAXone (ROCEPHIN)  IV  1 g Intravenous Once  . cefTRIAXone (ROCEPHIN)  IV  1 g Intravenous Q24H  . cholecalciferol  1,000 Units Oral Daily  . docusate sodium  100 mg Oral BID  . enoxaparin (LOVENOX) injection  30 mg Subcutaneous Q24H  . ezetimibe-simvastatin  1 tablet Oral Daily  . furosemide  20 mg Oral Daily  . losartan  50 mg Oral Daily  . multivitamin with minerals  1 tablet Oral Daily  . omega-3 acid ethyl esters  1 g Oral Daily  . sertraline  50 mg Oral Daily  . sodium chloride  3 mL Intravenous Q12H  . sodium chloride  3 mL Intravenous Q12H  . spironolactone  25 mg Oral Daily  . trimethoprim  100 mg Oral Daily  . warfarin  4 mg Oral Once  . Warfarin - Pharmacist Dosing Inpatient   Does not apply q1800  . DISCONTD: enoxaparin (LOVENOX) injection  40 mg Subcutaneous Q24H   Continuous Infusions:   Principal Problem:  *Angina at rest Active Problems:  HYPERLIPIDEMIA  HYPERTENSION  Systolic CHF, chronic  UTI (lower urinary tract infection)    Time spent: 40 minutes    [facility] M  [facility] Pager [phone]. If 8PM-8AM, please contact night-coverage at [email or web address], password [facility] 10/08/2011, 10:35 AM  LOS: 1 day       Patient seen and examined, agree with above note.Patient has chronic recurrent UTI's and is currently on long term antibiotics Trimethoprim at home. Will await the 2 d echo results.

## 2011-10-08 NOTE — Progress Notes (Signed)
ANTICOAGULATION CONSULT NOTE - Follow Up Consult  Pharmacy Consult for Coumadin Indication: apical aneurysm/  LV thrombus  Allergies  Allergen Reactions  . Codeine     Patient Measurements: Height: 4\' 10"  (147.3 cm) Weight: 149 lb 6.4 oz (67.767 kg) (a scale) IBW/kg (Calculated) : 40.9   Vital Signs: Temp: 98.1 F (36.7 C) (07/17 0603) Temp src: Oral (07/17 0603) BP: 126/71 mmHg (07/17 0603) Pulse Rate: 64  (07/17 0603)  Labs:  Basename 10/08/11 0915 10/08/11 0900 10/08/11 0200 10/08/11 0144 10/08/11 0143 10/07/11 2231 10/07/11 2010 10/07/11 1032 10/07/11 1024  HGB -- -- -- 10.8* -- -- 10.9* -- --  HCT -- -- -- 32.4* -- -- 32.9* -- --  PLT -- -- -- 171 -- -- 170 -- --  APTT -- -- -- -- -- -- -- -- --  LABPROT 21.1* -- -- -- -- 20.9* -- -- --  INR 1.79* -- -- -- -- 1.77* -- -- 1.9  HEPARINUNFRC -- -- -- -- -- -- -- -- --  CREATININE -- -- 1.51* -- -- -- 1.67* 1.7* --  CKTOTAL -- 227* -- -- 221* -- -- -- --  CKMB -- 5.3* -- -- 5.2* -- -- -- --  TROPONINI -- <0.30 -- -- <0.30 -- <0.30 -- --    Estimated Creatinine Clearance: 22.6 ml/min (by C-G formula based on Cr of 1.51).  Assessment:   INR subtherapeutic.    Home Coumadin regimen: 2.5 mg TTSat; 1.25 mg on MWFSun.  Received Coumadin 4 mg ~2am today.  Also on Lovenox 30 mg SQ q24hrs until INR >2.  Goal of Therapy:  INR 2-3 Monitor platelets by anticoagulation protocol: Yes   Plan:    Coumadin 2.5 mg today, instead of her usual 1.25 mg Wednesday dose.   Continue daily PT/INR.  Dennie Fetters, RPh Pager: [phone] 10/08/2011,10:47 AM

## 2011-10-08 NOTE — Consult Note (Signed)
Cardiology Consult Note   Patient ID: [name] MRN: [id number], DOB/AGE: 11-30-1925   Admit date: 10/07/2011 Date of Consult: 10/08/2011  Primary Physician: Pearson Grippe, MD Primary Cardiologist: Marca Ancona, MD  Pt. Profile: [name] is a 78yo female with PMHx significant for CAD (ant MI 2006 s/p DES to prox LAD; cath 07/2006 with nonobstructive CAD), ischemic cardiomyopathy (NYHA class II-III symptoms, EF 25-30%, s/p Medtronic ICD- VVI), apical aneurysm with LV thrombus, HTN, HL and CKD, stage 3 who was admitted to [facility] on 10/07/11 with chest pain.   Reason for consult: evaluation/management of chest pain  RECENT CARDIAC HISTORY  2D ECHO 03/2008: mild LV dilatation, mild LVH, EF 25-30%, aneurysmal deformity of the mid-distal anteroseptal wall, mural thrombus, mild diastolic dysfunction, mild-moderate MR, mild-moderate LA dilatation  Adenosine Myoview 05/2006: large previously noted infarction involving distal half to two-thirds of the ventricle. The ventricle is markedly dilated with a markedly reduced EF of 22%. No evidence of significant ischemia.   Cardiac cath 05/2006: normal left main, prox LAD 0% at stent site, 55% ostial D1 stenosis, LCx, ramus, PLB normal; 30% distal RCA stenosis, clonus, RV branch, PDA, PLBs x 4 all normal  Remote ICD check 09/2011: sensing, impedance consistent with previous device measurements. No ventricular arrhythmias recorded. All other diagnostic data reviewed and is appropriate and stable for patient. Real time EGM demonstrates appropriate sensing.  HPI:   The patient followed up with Dr. Shirlee Latch yesterday in clinic. At that time, she had complained of chronic low back and hip pain, but denies increased DOE (she does note mild baseline DOE on walking to her mailbox), chest pain, lightheadedness or falls. BP was noted to be elevated in the clinic (184/86) due to not having taken her BP meds that day. It typically runs SBP < 130. She  was found to be stable from an ischemic and heart failure standpoint. She was euvolemic on exam. She was continued on cardiac meds including statin, ARB, BB, spironolactone. LDL near goal in 06/2011. Warfarin continued for apical aneurysm with thrombus.   After her office visit, the patient went home, ate lunch and took her BP medications. BP at home was reportedly SBP 105. She did not miss any meds that she takes on a daily basis. She did not particularly over-exert herself throughout the day (baseline activity level consists of walking with a cane to her mailbox before becoming short of breath). She went to church and when she returned, she was checking her voicemail when she suddenly experienced a dull, burning left-sided chest pain without radiation lasting for < 30 minutes and rated at a 10/10 with associated diaphoresis. She notes her prior MI manifested as a neck discomfort, and yesterday's chest pain was different. No prior episodes of chest pain. No ICD shocks. She denies associated shortness of breath, lightheadedness, n/v, PND, orthopnea, LE edema or weight changes. No association with meals, and different from indigestion. No aggravation on deep inspiration. She does note a chronic productive cough consisting of clear sputum over the past several weeks. Denies sick contacts, fevers, chills or extended travel. She took one NTG SL and TUMS with minimal relief prompting her to call EMS. She was administered low-dose ASA x 4 and transported to [facility] ED.   The patient was admitted by the medicine service. CXR revealed no acute cardiopulmonary disease. Troponin-I negative x 3. pBNP this AM was mildly elevated at 1207.0. TSH WNL. Cr 1.7 > 1.67 > 1.51. INR 1.9 on  admission, 1.79 today. U/A revealed positive nitrites, moderate leukocytes. She was started on empiric antibiotics for UTI.   Problem List: Past Medical History  Diagnosis Date  . Pure hypercholesterolemia   . Arthritis     severe lower  back pain  . Other specified forms of chronic ischemic heart disease     echo (1/10) with EF 25-30%, mildly dilated LV, periapical LV aneueysm, calcified LV thombus, moderate MR. Medtronic ICD set to VVI.   Marland Kitchen CAD (coronary artery disease)     anterior MI in 2006 with Cypher DES to LAD  . HTN (hypertension)   . LV (left ventricular) mural thrombus     on coumadin   . Nephrolithiasis   . CHF (congestive heart failure)   . Cardiomyopathy     Past Surgical History  Procedure Date  . Aicd implantation     ICD-Medtronic. Remote-yes   . Coronary angioplasty with stent placement   . Coronary angioplasty   . Cardiac catheterization      Allergies:  Allergies  Allergen Reactions  . Codeine     Home Medications: Prior to Admission medications   Medication Sig Start Date End Date Taking? Authorizing Provider  carvedilol (COREG) 25 MG tablet Take 25 mg by mouth 2 (two) times daily with a meal.   Yes Historical Provider, MD  cholecalciferol (VITAMIN D) 1000 UNITS tablet Take 1,000 Units by mouth daily.     Yes Historical Provider, MD  ezetimibe-simvastatin (VYTORIN) 10-40 MG per tablet Take 1 tablet by mouth daily. 11/26/10  Yes Laurey Morale, MD  furosemide (LASIX) 20 MG tablet Take 20 mg by mouth daily.  10/07/11  Yes Laurey Morale, MD  Glucosamine Sulfate 500 MG TABS Take by mouth daily.     Yes Historical Provider, MD  losartan (COZAAR) 50 MG tablet Take 1 tablet (50 mg total) by mouth daily. 05/05/11  Yes Laurey Morale, MD  Multiple Vitamin (MULTIVITAMIN) tablet Take 1 tablet by mouth daily.     Yes Historical Provider, MD  nitroGLYCERIN (NITROSTAT) 0.4 MG SL tablet Place 1 tablet (0.4 mg total) under the tongue every 5 (five) minutes as needed. 05/12/11  Yes Marinus Maw, MD  Omega-3 Fatty Acids (FISH OIL) 1000 MG CAPS Take by mouth daily.     Yes Historical Provider, MD  sertraline (ZOLOFT) 50 MG tablet Take 50 mg by mouth daily.    Yes Historical Provider, MD  spironolactone  (ALDACTONE) 25 MG tablet Take 25 mg by mouth daily.   Yes Historical Provider, MD  trimethoprim (TRIMPEX) 100 MG tablet Take 100 mg by mouth daily. For recurring UTIs   Yes Historical Provider, MD  warfarin (COUMADIN) 2.5 MG tablet Take 1.25-2.5 mg by mouth daily. Take 1 tab on Tuesday, Thursday, and Saturday. Then take 1/2 tab all other days 06/23/11  Yes Marinus Maw, MD    Inpatient Medications:     . aspirin  324 mg Oral Once  . aspirin EC  81 mg Oral Daily  . carvedilol  25 mg Oral BID WC  . cefTRIAXone (ROCEPHIN)  IV  1 g Intravenous Once  . cefTRIAXone (ROCEPHIN)  IV  1 g Intravenous Q24H  . cholecalciferol  1,000 Units Oral Daily  . docusate sodium  100 mg Oral BID  . enoxaparin (LOVENOX) injection  30 mg Subcutaneous Q24H  . ezetimibe-simvastatin  1 tablet Oral Daily  . furosemide  20 mg Oral Daily  . losartan  50 mg Oral Daily  .  multivitamin with minerals  1 tablet Oral Daily  . omega-3 acid ethyl esters  1 g Oral Daily  . sertraline  50 mg Oral Daily  . sodium chloride  3 mL Intravenous Q12H  . sodium chloride  3 mL Intravenous Q12H  . spironolactone  25 mg Oral Daily  . trimethoprim  100 mg Oral Daily  . warfarin  2.5 mg Oral ONCE-1800  . warfarin  4 mg Oral Once  . Warfarin - Pharmacist Dosing Inpatient   Does not apply q1800  . DISCONTD: enoxaparin (LOVENOX) injection  40 mg Subcutaneous Q24H   Prescriptions prior to admission  Medication Sig Dispense Refill  . carvedilol (COREG) 25 MG tablet Take 25 mg by mouth 2 (two) times daily with a meal.      . cholecalciferol (VITAMIN D) 1000 UNITS tablet Take 1,000 Units by mouth daily.        Marland Kitchen ezetimibe-simvastatin (VYTORIN) 10-40 MG per tablet Take 1 tablet by mouth daily.  30 tablet  6  . furosemide (LASIX) 20 MG tablet Take 20 mg by mouth daily.       . Glucosamine Sulfate 500 MG TABS Take by mouth daily.        Marland Kitchen losartan (COZAAR) 50 MG tablet Take 1 tablet (50 mg total) by mouth daily.      . Multiple Vitamin  (MULTIVITAMIN) tablet Take 1 tablet by mouth daily.        . nitroGLYCERIN (NITROSTAT) 0.4 MG SL tablet Place 1 tablet (0.4 mg total) under the tongue every 5 (five) minutes as needed.  30 tablet  2  . Omega-3 Fatty Acids (FISH OIL) 1000 MG CAPS Take by mouth daily.        . sertraline (ZOLOFT) 50 MG tablet Take 50 mg by mouth daily.       Marland Kitchen spironolactone (ALDACTONE) 25 MG tablet Take 25 mg by mouth daily.      Marland Kitchen trimethoprim (TRIMPEX) 100 MG tablet Take 100 mg by mouth daily. For recurring UTIs      . warfarin (COUMADIN) 2.5 MG tablet Take 1.25-2.5 mg by mouth daily. Take 1 tab on Tuesday, Thursday, and Saturday. Then take 1/2 tab all other days        Family History  Problem Relation Age of Onset  . Breast cancer Mother   . Stroke Father   . Stroke Brother      History   Social History  . Marital Status: Widowed    Spouse Name: N/A    Number of Children: N/A  . Years of Education: N/A   Occupational History  . Not on file.   Social History Main Topics  . Smoking status: Never Smoker   . Smokeless tobacco: Never Used  . Alcohol Use: No  . Drug Use: No  . Sexually Active: Not on file   Other Topics Concern  . Not on file   Social History Narrative   Pt is widowed and lives alone near [place]. 3 daughters live nearby     Review of Systems: General: positive for diaphoresis, negative for chills, fever, night sweats or weight changes.  Cardiovascular: positive for chest pain, negative for dyspnea on exertion, edema, orthopnea, palpitations, paroxysmal nocturnal dyspnea or shortness of breath Dermatological: negative for rash Respiratory: positive for cough, negative for wheezing Urologic: negative for hematuria Abdominal: negative for nausea, vomiting, diarrhea, bright red blood per rectum, melena, or hematemesis Neurologic: negative for visual changes, syncope, or dizziness All other systems reviewed and  are otherwise negative except as noted above.  Physical  Exam: Blood pressure 126/71, pulse 64, temperature 98.1 F (36.7 C), temperature source Oral, resp. rate 18, height 4\' 10"  (1.473 m), weight 67.767 kg (149 lb 6.4 oz), SpO2 92.00%.    General: Elderly, well developed, in no acute distress. Head: Normocephalic, atraumatic, sclera non-icteric, no xanthomas, nares are without discharge.  Neck: Negative for carotid bruits. JVD not elevated. Lungs: Clear bilaterally to auscultation without wheezes, rales, or rhonchi. Breathing is unlabored. Heart: RRR with S1 S2. No murmurs, rubs, or gallops appreciated. Abdomen: Soft, non-tender, non-distended with normoactive bowel sounds. No hepatomegaly. No rebound/guarding. No obvious abdominal masses. Msk:  Strength and tone appears normal for age. Extremities: No clubbing, cyanosis or edema.  Distal pedal pulses are 2+ and equal bilaterally. Neuro: Alert and oriented X 3. Moves all extremities spontaneously. Psych:  Responds to questions appropriately with a normal affect.  Labs: Recent Labs  Basename 10/08/11 0144 10/07/11 2010   WBC 6.3 7.6   HGB 10.8* 10.9*   HCT 32.4* 32.9*   MCV 97.0 96.8   PLT 171 170   Lab 10/08/11 0200 10/07/11 2010 10/07/11 1032  NA 137 138 138  K 4.0 4.7 4.4  CL 99 101 104  CO2 23 22 27   BUN 49* 51* 48*  CREATININE 1.51* 1.67* 1.7*  CALCIUM 10.2 9.9 9.4  PROT -- 7.4 --  BILITOT -- 0.4 --  ALKPHOS -- 50 --  ALT -- 18 --  AST -- 29 --  AMYLASE -- -- --  LIPASE -- -- --  GLUCOSE 174* 141* 118*   Recent Labs  Basename 10/08/11 0900 10/08/11 0143 10/07/11 2010   CKTOTAL 227* 221* --   CKMB 5.3* 5.2* --   CKMBINDEX -- -- --   TROPONINI <0.30 <0.30 <0.30   Basename 10/08/11 0144  TSH 1.120  T4TOTAL --  T3FREE --  THYROIDAB --    Radiology/Studies: Dg Chest 2 View  10/07/2011  *RADIOLOGY REPORT*  Clinical Data: Chest pain, shortness of breath, dizziness and hypertension.  CHEST - 2 VIEW  Comparison: 03/18/2009  Findings: Stable cardiomegaly and appearance  of pacing / ICD device.  No evidence of pulmonary edema or pleural fluid.  Stable bibasilar scarring.  No focal consolidation.  Stable degenerative changes of the thoracic spine.  IMPRESSION: Stable cardiomegaly.  No acute findings.  Original Report Authenticated By: Reola Calkins, M.D.    EKG:  10/07/11 1938: NSR, 72 bpm, TWIs V5, V6, I, aVL, nonspecific ST changes V2-V4, deep Q waves II, III, aVF, V1-V3  ASSESSMENT:   1. Chest pain 2. CAD 3. Ischemic cardiomyopathy s/p Medtronic ICD 4. Apical aneurysm with thrombus 5. HTN 6. HL 7. UTI 8. CKD, stage 3  DISCUSSION/PLAN:   This is a fairy functional 78 yo lady despite her ischemic cardiomyopathy. Her chest pain experienced the night of admission has atypical and typical features. The patient was seen by Dr. Shirlee Latch yesterday in clinic and found to be stable from a cardiac perspective. She noted no prior episodes of chest pain, no increased DOE, no decrease in activity level. She denied recent weight increases, PND, LE edema. Chest pain last night occurred at rest. Not reminiscent of prior MI. Minimally relieved with NTG. No objective evidence of ischemia this admission- cardiac biomarkers WNL, EKG last night with subtle anterior ST changes when compared to EKG done in the office yesterday. Echo pending. She is fairly euvolemic on exam. No findings of pulmonary edema. CXR unremarkable  as above. It is unclear whether this is angina related. The patient certainly has a significant cardiac history and has not had an ischemic work-up since 2010. At the same token, she has been well-compensated and stable until yesterday evening. No definitive pattern prior to this event to suggest an unstable angina-type picture, however, the patient is female and elderly and may present atypically. Will plan for NPO midnight and hold Coumadin. Will discuss with Dr. Shirlee Latch on cath vs potentially outpatient stress testing. Will review echo findings as well.    Signed, R. Hurman Horn, PA-C 10/08/2011, 1:14 PM  Cardiology Attending  Patient seen and examined. I agree with the above exam, assessment and plan. She has a h/o an ICM, chronic systolic heart failure, and HTN who was admitted with chest pain and subtle ECG changes. She appears to have ruled out for MI. As she has known disease, an abnormal ECG, fairly typical symptoms, and has not undergone an ischemic evaluation in 5 years, I would suggest that she proceed with heart cath. I will discuss with Dr. Shirlee Latch.  Lewayne Bunting, M.D.

## 2011-10-08 NOTE — Progress Notes (Signed)
  Echocardiogram 2D Echocardiogram has been performed.  [name] 10/08/2011, 11:35 AM

## 2011-10-08 NOTE — ED Provider Notes (Signed)
History     CSN: [id number]  Arrival date & time 10/07/11  1931   First MD Initiated Contact with Patient 10/07/11 1935      Chief Complaint  Patient presents with  . Chest Pain    substernal chest pain - nonrad - onset approx 1700 this PM - also endorses sob and nonprod cough; took NTG x1 without relief; was given ASA 324 mg PO per EMS PTA; EMS reports RA sat was 88% - is NOT on home O2; was seen at [facility] today for routine check up       (Consider location/radiation/quality/duration/timing/severity/associated sxs/prior treatment) HPI Patient presents emergency department with midsternal chest pain, that was nonradiating.  Patient, states she also had shortness of breath.  This pain occurred this afternoon.  Patient was also seen by [facility] cardiology earlier today.  Patient, states that she took nitroglycerin and it seemed to relieve her pain.  She denies, nausea, vomiting, abdominal pain, back pain, headache, weakness, syncope, dizziness, or fever.  Patient, states she may have had some mild shortness of breath.  Patient denies any diaphoresis or exertional symptoms. Past Medical History  Diagnosis Date  . Pure hypercholesterolemia   . Arthritis     severe lower back pain  . Other specified forms of chronic ischemic heart disease     echo (1/10) with EF 25-30%, mildly dilated LV, periapical LV aneueysm, calcified LV thombus, moderate MR. Medtronic ICD set to VVI.   Marland Kitchen CAD (coronary artery disease)     anterior MI in 2006 with Cypher DES to LAD  . HTN (hypertension)   . LV (left ventricular) mural thrombus     on coumadin   . Nephrolithiasis   . CHF (congestive heart failure)   . Cardiomyopathy     Past Surgical History  Procedure Date  . Aicd implantation     ICD-Medtronic. Remote-yes   . Coronary angioplasty with stent placement   . Coronary angioplasty   . Cardiac catheterization     Family History  Problem Relation Age of Onset  . Breast cancer Mother   .  Stroke Father   . Stroke Brother     History  Substance Use Topics  . Smoking status: Never Smoker   . Smokeless tobacco: Never Used  . Alcohol Use: No    OB History    Grav Para Term Preterm Abortions TAB SAB Ect Mult Living                  Review of Systems All other systems negative except as documented in the HPI. All pertinent positives and negatives as reviewed in the HPI.  Allergies  Codeine  Home Medications   Current Outpatient Rx  Name Route Sig Dispense Refill  . CARVEDILOL 25 MG PO TABS Oral Take 25 mg by mouth 2 (two) times daily with a meal.    . VITAMIN D 1000 UNITS PO TABS Oral Take 1,000 Units by mouth daily.      Marland Kitchen EZETIMIBE-SIMVASTATIN 10-40 MG PO TABS Oral Take 1 tablet by mouth daily. 30 tablet 6  . FUROSEMIDE 20 MG PO TABS Oral Take 20 mg by mouth daily.     Marland Kitchen GLUCOSAMINE SULFATE 500 MG PO TABS Oral Take by mouth daily.      Marland Kitchen LOSARTAN POTASSIUM 50 MG PO TABS Oral Take 1 tablet (50 mg total) by mouth daily.    Marland Kitchen ONE-DAILY MULTI VITAMINS PO TABS Oral Take 1 tablet by mouth daily.      Marland Kitchen  NITROGLYCERIN 0.4 MG SL SUBL Sublingual Place 1 tablet (0.4 mg total) under the tongue every 5 (five) minutes as needed. 30 tablet 2  . FISH OIL 1000 MG PO CAPS Oral Take by mouth daily.      . SERTRALINE HCL 50 MG PO TABS Oral Take 50 mg by mouth daily.      Marland Kitchen SPIRONOLACTONE 25 MG PO TABS Oral Take 25 mg by mouth daily.    Marland Kitchen TRIMETHOPRIM 100 MG PO TABS Oral Take 100 mg by mouth daily. For recurring UTIs    . WARFARIN SODIUM 2.5 MG PO TABS Oral Take 1.25-2.5 mg by mouth daily. Take 1 tab on Tuesday, Thursday, and Saturday. Then take 1/2 tab all other days      BP 106/69  Pulse 64  Temp 98.5 F (36.9 C) (Oral)  Resp 21  SpO2 96%  Physical Exam  Nursing note and vitals reviewed. Constitutional: She is oriented to person, place, and time. She appears well-developed and well-nourished. No distress.  HENT:  Head: Normocephalic and atraumatic.  Mouth/Throat:  Oropharynx is clear and moist. No oropharyngeal exudate.  Neck: Normal range of motion. Neck supple.  Cardiovascular: Normal rate and regular rhythm.  Exam reveals no gallop and no friction rub.   No murmur heard. Pulmonary/Chest: Effort normal and breath sounds normal. No respiratory distress. She has no wheezes. She has no rales.  Abdominal: Soft. Bowel sounds are normal. She exhibits no distension. There is no tenderness. There is no rebound.  Neurological: She is alert and oriented to person, place, and time.  Skin: Skin is warm and dry. No rash noted.    ED Course  Procedures (including critical care time)  Labs Reviewed  CBC WITH DIFFERENTIAL - Abnormal; Notable for the following:    RBC 3.40 (*)     Hemoglobin 10.9 (*)     HCT 32.9 (*)     All other components within normal limits  COMPREHENSIVE METABOLIC PANEL - Abnormal; Notable for the following:    Glucose, Bld 141 (*)     BUN 51 (*)     Creatinine, Ser 1.67 (*)     GFR calc non Af Amer 27 (*)     GFR calc Af Amer 31 (*)     All other components within normal limits  URINALYSIS, ROUTINE W REFLEX MICROSCOPIC - Abnormal; Notable for the following:    Color, Urine ORANGE (*)  BIOCHEMICALS MAY BE AFFECTED BY COLOR   Nitrite POSITIVE (*)     Leukocytes, UA MODERATE (*)     All other components within normal limits  URINE MICROSCOPIC-ADD ON - Abnormal; Notable for the following:    Bacteria, UA MANY (*)     Casts HYALINE CASTS (*)     All other components within normal limits  PROTIME-INR - Abnormal; Notable for the following:    Prothrombin Time 20.9 (*)     INR 1.77 (*)     All other components within normal limits  TROPONIN I   Dg Chest 2 View  10/07/2011  *RADIOLOGY REPORT*  Clinical Data: Chest pain, shortness of breath, dizziness and hypertension.  CHEST - 2 VIEW  Comparison: 03/18/2009  Findings: Stable cardiomegaly and appearance of pacing / ICD device.  No evidence of pulmonary edema or pleural fluid.  Stable  bibasilar scarring.  No focal consolidation.  Stable degenerative changes of the thoracic spine.  IMPRESSION: Stable cardiomegaly.  No acute findings.  Original Report Authenticated By: Reola Calkins, M.D.  1. Chest pain    Patient be admitted to the hospital by the [facility] hospitalist service.  Patient has been pain free here in the emergency department.    MDM  MDM Reviewed: vitals and nursing note Reviewed previous: ECG and labs Interpretation: ECG, labs and x-ray Consults: admitting MD   Date: 10/08/2011  Rate: 72  Rhythm: normal sinus rhythm  QRS Axis: normal  Intervals: normal  ST/T Wave abnormalities: normal  Conduction Disutrbances:left anterior fascicular block  Narrative Interpretation: Patient does have T wave inversion  Old EKG Reviewed: changes noted             Carlyle Dolly, PA-C 10/08/11 0017

## 2011-10-08 NOTE — Progress Notes (Signed)
Patient admitted to floor from [facility] ED for further work up of chest pain.  Arrived via stretcher, alert and oriented x4, capable of ambulating to the bed.  Patient appears in no apparent distress.  Denies chest pain and shortness of breath on arrival.  Placed patient on continuous telemetry and oriented her to room and floor procedures.  Will continue to monitor patient condition.  Idolina Primer RN

## 2011-10-08 NOTE — Plan of Care (Signed)
Problem: Phase I Progression Outcomes Goal: Anginal pain relieved Outcome: Completed/Met Date Met:  10/08/11 Patient denies chest pain upon arrival to room.

## 2011-10-09 ENCOUNTER — Encounter (HOSPITAL_COMMUNITY)
Admission: EM | Disposition: A | Discharge status: Home / Self Care | Payer: Self-pay | Source: Home / Self Care | Attending: Family Medicine

## 2011-10-09 DIAGNOSIS — I5022 Chronic systolic (congestive) heart failure: Secondary | ICD-10-CM

## 2011-10-09 DIAGNOSIS — I253 Aneurysm of heart: Secondary | ICD-10-CM

## 2011-10-09 LAB — URINE CULTURE: Colony Count: 30000

## 2011-10-09 LAB — PROTIME-INR
INR: 2.09 — ABNORMAL HIGH (ref 0.00–1.49)
Prothrombin Time: 23.8 seconds — ABNORMAL HIGH (ref 11.6–15.2)

## 2011-10-09 LAB — BASIC METABOLIC PANEL
Chloride: 106 mEq/L (ref 96–112)
GFR calc Af Amer: 43 mL/min — ABNORMAL LOW (ref >90)
Potassium: 4.3 mEq/L (ref 3.5–5.1)

## 2011-10-09 SURGERY — LEFT HEART CATHETERIZATION WITH CORONARY ANGIOGRAM
Anesthesia: LOCAL

## 2011-10-09 MED ORDER — DSS 100 MG PO CAPS: 100.0000 mg | ORAL_CAPSULE | Freq: Two times a day (BID) | ORAL | Status: AC

## 2011-10-09 MED ORDER — ASPIRIN 81 MG PO TBEC: 81.0000 mg | DELAYED_RELEASE_TABLET | Freq: Every day | ORAL | Status: DC

## 2011-10-09 MED ORDER — FUROSEMIDE 40 MG PO TABS
40.0000 mg | ORAL_TABLET | Freq: Every day | ORAL | Status: DC
Start: 2011-10-09 — End: 2011-10-09
  Administered 2011-10-09: 40 mg via ORAL
  Filled 2011-10-09: qty 1

## 2011-10-09 MED ORDER — FUROSEMIDE 40 MG PO TABS: ORAL_TABLET | ORAL | Status: DC

## 2011-10-09 NOTE — Progress Notes (Addendum)
Patient ID: [name], female   DOB: [birth year], 78 y.o.   MRN: [id number]    SUBJECTIVE: No further chest pain.  Echo yesterday was stable with EF 25-30% and peri-apical akinesis.  She feels well this morning.      Marland Kitchen aspirin  324 mg Oral Once  . aspirin EC  81 mg Oral Daily  . carvedilol  25 mg Oral BID WC  . cefTRIAXone (ROCEPHIN)  IV  1 g Intravenous Q24H  . cholecalciferol  1,000 Units Oral Daily  . docusate sodium  100 mg Oral BID  . enoxaparin (LOVENOX) injection  30 mg Subcutaneous Q24H  . ezetimibe-simvastatin  1 tablet Oral Daily  . furosemide  40 mg Oral Daily  . losartan  50 mg Oral Daily  . multivitamin with minerals  1 tablet Oral Daily  . omega-3 acid ethyl esters  1 g Oral Daily  . sertraline  50 mg Oral Daily  . sodium chloride  3 mL Intravenous Q12H  . sodium chloride  3 mL Intravenous Q12H  . spironolactone  25 mg Oral Daily  . trimethoprim  100 mg Oral Daily  . warfarin  2.5 mg Oral ONCE-1800  . Warfarin - Pharmacist Dosing Inpatient   Does not apply q1800  . DISCONTD: furosemide  20 mg Oral Daily      Filed Vitals:   10/08/11 0603 10/08/11 1429 10/08/11 2149 10/09/11 0530  BP: 126/71 114/51 120/57 114/54  Pulse: 64 64 61 63  Temp: 98.1 F (36.7 C) 97.1 F (36.2 C) 98.5 F (36.9 C) 98.2 F (36.8 C)  TempSrc: Oral Oral Oral Oral  Resp: 18 20 18 18   Height:      Weight: 149 lb 6.4 oz (67.767 kg)   148 lb 11.2 oz (67.45 kg)  SpO2: 92% 93% 92% 92%    Intake/Output Summary (Last 24 hours) at 10/09/11 0739 Last data filed at 10/08/11 2301  Gross per 24 hour  Intake    770 ml  Output    675 ml  Net     95 ml    LABS: Basic Metabolic Panel:  Basename 10/09/11 0500 10/08/11 0200  NA 142 137  K 4.3 4.0  CL 106 99  CO2 24 23  GLUCOSE 100* 174*  BUN 44* 49*  CREATININE 1.28* 1.51*  CALCIUM 9.2 10.2  MG -- --  PHOS -- --   Liver Function Tests:  [facility] 10/07/11 2010  AST 29  ALT 18  ALKPHOS 50  BILITOT 0.4  PROT 7.4  ALBUMIN  4.1   No results found for this basename: LIPASE:2,AMYLASE:2 in the last 72 hours CBC:  Basename 10/08/11 0144 10/07/11 2010  WBC 6.3 7.6  NEUTROABS -- 5.1  HGB 10.8* 10.9*  HCT 32.4* 32.9*  MCV 97.0 96.8  PLT 171 170   Cardiac Enzymes:  Basename 10/08/11 1650 10/08/11 0900 10/08/11 0143  CKTOTAL 196* 227* 221*  CKMB 4.5* 5.3* 5.2*  CKMBINDEX -- -- --  TROPONINI <0.30 <0.30 <0.30   BNP: No components found with this basename: POCBNP:3 D-Dimer: No results found for this basename: DDIMER:2 in the last 72 hours Hemoglobin A1C: No results found for this basename: HGBA1C in the last 72 hours Fasting Lipid Panel: No results found for this basename: CHOL,HDL,LDLCALC,TRIG,CHOLHDL,LDLDIRECT in the last 72 hours Thyroid Function Tests:  Basename 10/08/11 0144  TSH 1.120  T4TOTAL --  T3FREE --  THYROIDAB --   Anemia Panel: No results found for this basename: VITAMINB12,FOLATE,FERRITIN,TIBC,IRON,RETICCTPCT in the last  72 hours  RADIOLOGY: Dg Chest 2 View  10/07/2011  *RADIOLOGY REPORT*  Clinical Data: Chest pain, shortness of breath, dizziness and hypertension.  CHEST - 2 VIEW  Comparison: 03/18/2009  Findings: Stable cardiomegaly and appearance of pacing / ICD device.  No evidence of pulmonary edema or pleural fluid.  Stable bibasilar scarring.  No focal consolidation.  Stable degenerative changes of the thoracic spine.  IMPRESSION: Stable cardiomegaly.  No acute findings.  Original Report Authenticated By: Reola Calkins, M.D.    PHYSICAL EXAM General: NAD Neck: JVP 8 cm, no thyromegaly or thyroid nodule.  Lungs: Clear to auscultation bilaterally with normal respiratory effort. CV: Nondisplaced PMI.  Heart regular S1/S2, no S3/S4, no murmur.  No peripheral edema.  No carotid bruit.  Normal pedal pulses.  Abdomen: Soft, nontender, no hepatosplenomegaly, no distention.  Neurologic: Alert and oriented x 3.  Psych: Normal affect. Extremities: No clubbing or cyanosis.    TELEMETRY: Reviewed telemetry pt in NSR  ASSESSMENT AND PLAN:  78 yo with history of CAD, ischemic cardiomyopathy, and CKD presented with chest pain.  1. CAD: Patient had episode of CP at rest lasting about 10 minutes.  She has had no chest pain since that time.  Cardiac enzymes are negative.  She has a known ischemic cardiomyopathy.  Given her elevated creatinine (1.7 at admission, now 1.28 => GFR quite low given age) would like to avoid cath if possible.  - Continue current cardiac meds. - Lexiscan myoview this morning. If there is no significant ischemia, she can go home (suspect there will be peri-apical scar from prior MI).  2. CHF: Chronic systolic CHF.  Patient is mildly volume overloaded with elevated BNP.  I would keep her on Lasix 40 mg daily for now.  When she goes home, she should alternate Lasix 40 mg daily with Lasix 20 mg daily (take on alternating days).  She will need a BMET about 1 week after discharge.   3. CKD: Creatinine 1.7 at admission, now improved but BUN still high.  4. UTI: Ceftriaxone, change to po at discharge.   Marca Ancona 10/09/2011 7:44 AM   Nuclear camera is down, will be unable to get stress test today.  I will arrange for an outpatient stress to be done asap.  Given no further symptoms and negative enzymes, I will let her go home today.   Marca Ancona 10/09/2011 7:49 AM

## 2011-10-09 NOTE — Discharge Summary (Signed)
Physician Discharge Summary  [name] ZOX:[id number] DOB: 1925-11-09 [name]: 10/07/2011  PCP: Pearson Grippe, MD  Admit date: 10/07/2011 Discharge date: 10/09/2011  Recommendations for Outpatient Follow-up:  1. OP stress test for 10/13/11 2. Labs drawn 10/16/11 Dr. Selena Batten 3. August 8 appointment with Dr. Selena Batten  Discharge Diagnoses:  Principal Problem:  *Angina at rest Active Problems:  HYPERLIPIDEMIA  HYPERTENSION  Systolic CHF, chronic  UTI (lower urinary tract infection)   Discharge Condition: Medically stable and ready for discharge to home  Diet recommendation: Heart Healthy  History of present illness:  An 78 year old female with known history of coronary artery disease who has been stable presents to ED 10/08/11 with sudden onset of chest pain. Patient was seen earlier that day by her cardiologist at the time she was doing fine. When she got home she started feeling chest pain. Pain was rated as 4/10 centrally located. No radiation. She took a nitroglycerin and was brought to the emergency room. Her chest pain  resolved after arriving in the emergency room. She was however noted to have blood pressure of 220/110 on arrival. Patient also reported some dysuria and frequency. Her urinalysis showed evidence of urinary tract infection. She has known ischemic cardiomyopathy with ejection fraction of about 25-30%. She however denied any shortness of breath or any symptoms of fluid overload. She denied fever or chills. No nausea vomiting or diarrhea. Patient believed she's back to her baseline however with her known history of coronary artery disease and symptoms of chest pain she was admitted for observation and ruling out acute coronary syndrome   Hospital Course:  Plan #1 chest pain: admitted to tele. Had one episode of CP early am at rest that lasted about 10 minutes. Had no further episodes of CP. 2decho with limited results. Echo 2010 mild LV dilatation,mild LVH, EF 25-30%, aneurysmal deformity  of the mid-distal anteroseptal wall, mural thrombus, mild diastolic dysfunction. CE neg.  EKG with subtle changes. Pt seen by cardiology and initially a cath planned but cancelled due renal function. Stress test scheduled but cancelled due to equipment failure. Pt will have OP stress test 10/13/11.   Plan #2 UTI: Pt with history of chronic/recurring UTI.Given one dose Rocephin IV.  Remained afebrile, non-toxic during this hospitalization. She is on long term Trimethoprim and this will be resumed at discharge.    Plan #3 Acute on chronic renal insufficiency. Improving. Creatinine on admission 1.7. At discharge 1.28. In 3/13 1.4 and 1 year ago 1.1. Likely related to meds. Will need OP BMET and this has been scheduled Dr. Elmyra Ricks office 10/16/11.   Plan #4 ischemic cardiomyopathy: Pt seen by cardiology. See problem #1. BNP on admission 1207. At discharge wt. 67.4 from 67.8 on admission. Volume status at discharge +288. Will change lasix to alternating 40mg  with 20mg  daily per cards recommendation.   Plan #5 hypertension: Blood pressure initially elevated. At discharge controlled. SBP range 122-134. Continue home lasix as above, BB.  Plan #6 hyperlipidemia: Continue with her statin  Plan #7 LV thrombus on coumadin .  Planc #8 Chronic low back pain. At baseline. Continue home meds.     Procedures:  2decho  Consultations:  Cardilology  Discharge Exam: Filed Vitals:   10/09/11 0530  BP: 114/54  Pulse: 63  Temp: 98.2 F (36.8 C)  Resp: 18   Filed Vitals:   10/08/11 0603 10/08/11 1429 10/08/11 2149 10/09/11 0530  BP: 126/71 114/51 120/57 114/54  Pulse: 64 64 61 63  Temp: 98.1 F (36.7  C) 97.1 F (36.2 C) 98.5 F (36.9 C) 98.2 F (36.8 C)  TempSrc: Oral Oral Oral Oral  Resp: 18 20 18 18   Height:      Weight: 67.767 kg (149 lb 6.4 oz)   67.45 kg (148 lb 11.2 oz)  SpO2: 92% 93% 92% 92%   General: Up in chair, alert, NAD Cardiovascular: RRR, No MGR, No LEE Respiratory: Normal  effort, BSCTAB, no wheeze, rales  Discharge Instructions  Discharge Orders    Future Appointments: Provider: Department: Dept Phone: Center:   10/13/2011 11:45 AM [facility] (Nuc Treadm) [facility]  None   11/11/2011 10:00 AM [facility] [phone] None   12/29/2011 11:55 AM [facility] [phone] [facility]   02/09/2012 10:15 AM Laurey Morale, MD [facility] [phone] [facility]     Future Orders Please Complete By Expires   Diet - low sodium heart healthy      Increase activity slowly      Discharge instructions      Comments:   Keep August appointment with Dr. Selena Batten.  Appointment for labs at Dr. Elmyra Ricks office 10/16/11 . May go to office anytime between 7:30 and 5:30   (HEART FAILURE PATIENTS) Call MD:  Anytime you have any of the following symptoms: 1) 3 pound weight gain in 24 hours or 5 pounds in 1 week 2) shortness of breath, with or without a dry hacking cough 3) swelling in the hands, feet or stomach 4) if you have to sleep on extra pillows at night in order to breathe.      Call MD for:  persistant nausea and vomiting      Call MD for:  severe uncontrolled pain        Medication List  As of 10/09/2011  9:34 AM   TAKE these medications         aspirin 81 MG EC tablet   Take 1 tablet (81 mg total) by mouth daily.      carvedilol 25 MG tablet   Commonly known as: COREG   Take 25 mg by mouth 2 (two) times daily with a meal.      cholecalciferol 1000 UNITS tablet   Commonly known as: VITAMIN D   Take 1,000 Units by mouth daily.      DSS 100 MG Caps   Take 100 mg by mouth 2 (two) times daily.      ezetimibe-simvastatin 10-40 MG per tablet   Commonly known as: VYTORIN   Take 1 tablet by mouth daily.      Fish Oil 1000 MG Caps   Take by mouth daily.      furosemide 40 MG tablet   Commonly known as: LASIX   Take daily alternating whole tab with half tab.   Take daily  alternating 40mg  with 20mg .      Glucosamine Sulfate 500 MG Tabs   Take by mouth daily.      losartan 50 MG tablet   Commonly known as: COZAAR   Take 1 tablet (50 mg total) by mouth daily.      multivitamin tablet   Take 1 tablet by mouth daily.      nitroGLYCERIN 0.4 MG SL tablet   Commonly known as: NITROSTAT   Place 1 tablet (0.4 mg total) under the tongue every 5 (five) minutes as needed.      sertraline 50 MG tablet   Commonly known as: ZOLOFT  Take 50 mg by mouth daily.      spironolactone 25 MG tablet   Commonly known as: ALDACTONE   Take 25 mg by mouth daily.      trimethoprim 100 MG tablet   Commonly known as: TRIMPEX   Take 100 mg by mouth daily. For recurring UTIs      warfarin 2.5 MG tablet   Commonly known as: COUMADIN   Take 1.25-2.5 mg by mouth daily. Take 1 tab on Tuesday, Thursday, and Saturday. Then take 1/2 tab all other days           Follow-up Information    Follow up with [facility] on 10/13/2011. (For stress test. Please arrive at 11:30 AM for study at 11:45 AM. )    Contact information:   [address] [place] [address]       Follow up with Pearson Grippe, MD on 10/16/2011. (for labs i.e. BMET, INR. May go to office anytime between  7:30 and 5:30 to have labs drawn)    Contact information:   [address] [place] [address] [phone]           The results of significant diagnostics from this hospitalization (including imaging, microbiology, ancillary and laboratory) are listed below for reference.    Significant Diagnostic Studies: Dg Chest 2 View  10/07/2011  *RADIOLOGY REPORT*  Clinical Data: Chest pain, shortness of breath, dizziness and hypertension.  CHEST - 2 VIEW  Comparison: 03/18/2009  Findings: Stable cardiomegaly and appearance of pacing / ICD device.  No evidence of pulmonary edema or pleural fluid.  Stable bibasilar scarring.  No focal consolidation.  Stable  degenerative changes of the thoracic spine.  IMPRESSION: Stable cardiomegaly.  No acute findings.  Original Report Authenticated By: Reola Calkins, M.D.    Microbiology: No results found for this or any previous visit (from the past 240 hour(s)).   Labs: Basic Metabolic Panel:  Lab 10/09/11 6295 10/08/11 0200 10/07/11 2010 10/07/11 1032  NA 142 137 138 138  K 4.3 4.0 4.7 4.4  CL 106 99 101 104  CO2 24 23 22 27   GLUCOSE 100* 174* 141* 118*  BUN 44* 49* 51* 48*  CREATININE 1.28* 1.51* 1.67* 1.7*  CALCIUM 9.2 10.2 9.9 9.4  MG -- -- -- --  PHOS -- -- -- --   Liver Function Tests:  Lab 10/07/11 2010  AST 29  ALT 18  ALKPHOS 50  BILITOT 0.4  PROT 7.4  ALBUMIN 4.1   No results found for this basename: LIPASE:5,AMYLASE:5 in the last 168 hours No results found for this basename: AMMONIA:5 in the last 168 hours CBC:  Lab 10/08/11 0144 10/07/11 2010  WBC 6.3 7.6  NEUTROABS -- 5.1  HGB 10.8* 10.9*  HCT 32.4* 32.9*  MCV 97.0 96.8  PLT 171 170   Cardiac Enzymes:  Lab 10/08/11 1650 10/08/11 0900 10/08/11 0143 10/07/11 2010  CKTOTAL 196* 227* 221* --  CKMB 4.5* 5.3* 5.2* --  CKMBINDEX -- -- -- --  TROPONINI <0.30 <0.30 <0.30 <0.30   BNP: BNP (last 3 results)  Basename 10/08/11 0143 04/29/11 1033 12/10/10 1548  PROBNP 1207.0* 227.0* 170.0*   CBG: No results found for this basename: GLUCAP:5 in the last 168 hours  Time coordinating discharge: 40 minutes  Signed:  Gwenyth Bender NP [facility] 10/09/2011, 9:34 AM    Patient seen and examined Agree with above No cath per cardiology Outpatient stress test

## 2011-10-09 NOTE — Progress Notes (Signed)
IV d/c'd.  Tele d/c'd.  Pt d/c'd to home.  Home meds and d/c instructions have been reviewed with pt.  Pt denies any questions or concerns.  Pt leaving unit via wheelchair and appears in no acute distress. Eugune Sine RN 

## 2011-10-09 NOTE — Evaluation (Signed)
Physical Therapy Evaluation Patient Details Name: [name] MRN: [id number] DOB: 1926-02-16 Today's Date: 10/09/2011 Time: 7829-5621 PT Time Calculation (min): 19 min  PT Assessment / Plan / Recommendation Clinical Impression  Pt admitted for angina, no current chest pain. Pt is at her baseline functional mobility, no acute PT needs. Will not follow    PT Assessment  Patent does not need any further PT services    Follow Up Recommendations  No PT follow up;Supervision - Intermittent       Equipment Recommendations  None recommended by OT          Precautions / Restrictions Precautions Precautions: None Restrictions Weight Bearing Restrictions: No         Mobility  Bed Mobility Bed Mobility: Supine to Sit;Sitting - Scoot to Edge of Bed Supine to Sit: 7: Independent Sitting - Scoot to Edge of Bed: 7: Independent Transfers Transfers: Sit to Stand;Stand to Sit Sit to Stand: 5: Supervision;With upper extremity assist;From bed;From toilet Stand to Sit: 5: Supervision;With upper extremity assist;To toilet;To chair/3-in-1 Details for Transfer Assistance: VC for hand placement. No physical assist needed Ambulation/Gait Ambulation/Gait Assistance: 5: Supervision Ambulation Distance (Feet): 75 Feet Assistive device: Rolling walker Ambulation/Gait Assistance Details: VC for proper hand placement and sequencing with RW. No physical assist needed Gait Pattern: Trunk flexed Gait velocity: normal gait speed Stairs: Yes Stairs Assistance: 5: Supervision Stairs Assistance Details (indicate cue type and reason): VC for hand held assistance. No physical assistance needed. Discussed need for supervision on stairs at home Stair Management Technique: One rail Right;Step to pattern;Forwards Number of Stairs: 2       Visit Information  Last PT Received On: 10/09/11 Assistance Needed: +1 PT/OT Co-Evaluation/Treatment: Yes    Subjective Data  Patient Stated Goal: to go home     Prior Functioning  Home Living Lives With: Alone Available Help at Discharge: Family;Available PRN/intermittently (can be there all the time if need be) Type of Home: House Home Access: Stairs to enter [employer] of Steps: 1 Entrance Stairs-Rails: None Home Layout: One level Bathroom Shower/Tub: Forensic scientist: Handicapped height Bathroom Accessibility: Yes How Accessible: Accessible via walker Home Adaptive Equipment: Straight cane;Walker - rolling;Shower chair with back Prior Function Level of Independence: Independent with assistive device(s) (cane) Able to Take Stairs?: Yes Driving: No Vocation: Retired Musician: No difficulties Dominant Hand: Right    Cognition  Overall Cognitive Status: Appears within functional limits for tasks assessed/performed Arousal/Alertness: Awake/alert Orientation Level: Appears intact for tasks assessed Behavior During Session: [facility] for tasks performed    Extremity/Trunk Assessment Right Upper Extremity Assessment RUE ROM/Strength/Tone: Within functional levels Left Upper Extremity Assessment LUE ROM/Strength/Tone: Within functional levels Right Lower Extremity Assessment RLE ROM/Strength/Tone: Within functional levels RLE Sensation: WFL - Light Touch Left Lower Extremity Assessment LLE ROM/Strength/Tone: Within functional levels LLE Sensation: WFL - Light Touch   Balance    End of Session PT - End of Session Equipment Utilized During Treatment: Gait belt Activity Tolerance: Patient tolerated treatment well Patient left: in chair;with call bell/phone within reach Nurse Communication: Mobility status    Milana Kidney 10/09/2011, 9:45 AM  10/09/2011 Milana Kidney DPT PAGER: [phone] OFFICE: [phone]

## 2011-10-09 NOTE — Evaluation (Signed)
Occupational Therapy Evaluation and Discharge Patient Details Name: [name] MRN: [id number] DOB: 25-Sep-1925 Today's Date: 10/09/2011 Time: 6440-3474 OT Time Calculation (min): 20 min  OT Assessment / Plan / Recommendation Clinical Impression  This 78 yo admitted with increased Bp and c/o CP presents to acute OT at baseline. No further OT needs noted, will sign off.    OT Assessment  Patient does not need any further OT services    Follow Up Recommendations  No OT follow up    Barriers to Discharge      Equipment Recommendations  None recommended by OT    Recommendations for Other Services    Frequency       Precautions / Restrictions Precautions Precautions: None Restrictions Weight Bearing Restrictions: No   Pertinent Vitals/Pain     ADL  Eating/Feeding: Simulated;Independent Where Assessed - Eating/Feeding: Edge of bed Grooming: Performed;Independent Where Assessed - Grooming: Unsupported standing Upper Body Bathing: Simulated;Modified independent Where Assessed - Upper Body Bathing: Unsupported sit to stand Lower Body Bathing: Simulated;Modified independent Where Assessed - Lower Body Bathing: Unsupported sit to stand Upper Body Dressing: Simulated;Modified independent Where Assessed - Upper Body Dressing: Unsupported sit to stand Lower Body Dressing: Simulated;Modified independent Where Assessed - Lower Body Dressing: Unsupported sit to stand Toilet Transfer: Performed;Modified independent Toilet Transfer Method: Sit to Barista: Regular height toilet;Grab bars Toileting - Clothing Manipulation and Hygiene: Performed;Independent Where Assessed - Toileting Clothing Manipulation and Hygiene: Standing Equipment Used: Rolling walker;Gait belt Transfers/Ambulation Related to ADLs: Mod I    OT Diagnosis:    OT Problem List:   OT Treatment Interventions:     OT Goals    Visit Information  Last OT Received On: 10/09/11 Assistance  Needed: +1 PT/OT Co-Evaluation/Treatment: Yes    Subjective Data  Subjective: When you get old you don't care (keeping backside covered) Patient Stated Goal: Going home today   Prior Functioning  Vision/Perception  Home Living Lives With: Alone Available Help at Discharge: Family;Available PRN/intermittently (can be there all the time if need be) Type of Home: House Home Access: Stairs to enter [employer] of Steps: 1 Entrance Stairs-Rails: None Home Layout: One level Bathroom Shower/Tub: Forensic scientist: Handicapped height Bathroom Accessibility: Yes How Accessible: Accessible via walker Home Adaptive Equipment: Straight cane;Walker - rolling;Shower chair with back Prior Function Level of Independence: Independent with assistive device(s) (cane) Able to Take Stairs?: Yes Driving: No Vocation: Retired Musician: No difficulties Dominant Hand: Right      Cognition  Overall Cognitive Status: Appears within functional limits for tasks assessed/performed Arousal/Alertness: Awake/alert Orientation Level: Appears intact for tasks assessed Behavior During Session: [facility] for tasks performed    Extremity/Trunk Assessment Right Upper Extremity Assessment RUE ROM/Strength/Tone: Within functional levels Left Upper Extremity Assessment LUE ROM/Strength/Tone: Within functional levels   Mobility Bed Mobility Bed Mobility: Supine to Sit;Sitting - Scoot to Edge of Bed Supine to Sit: 7: Independent Sitting - Scoot to Edge of Bed: 7: Independent Transfers Sit to Stand: 5: Supervision;With upper extremity assist;From bed;From toilet Stand to Sit: 5: Supervision;With upper extremity assist;To toilet;To chair/3-in-1 Details for Transfer Assistance: VC for hand placement. No physical assist needed   Exercise    Balance    End of Session OT - End of Session Equipment Utilized During Treatment: Gait belt Activity Tolerance: Patient  tolerated treatment well Patient left: in chair;with call bell/phone within reach       Evette Georges [phone] 10/09/2011, 8:53 AM

## 2011-10-10 NOTE — ED Provider Notes (Signed)
Medical screening examination/treatment/procedure(s) were performed by non-physician practitioner and as supervising physician I was immediately available for consultation/collaboration.   Laray Anger, DO 10/10/11 [phone]

## 2011-10-13 ENCOUNTER — Ambulatory Visit (HOSPITAL_COMMUNITY): Payer: Medicare Other | Attending: Cardiovascular Disease | Admitting: Radiology

## 2011-10-13 VITALS — BP 116/60 | HR 55 | Ht 60.0 in | Wt 150.0 lb

## 2011-10-13 DIAGNOSIS — R9439 Abnormal result of other cardiovascular function study: Secondary | ICD-10-CM | POA: Insufficient documentation

## 2011-10-13 DIAGNOSIS — E7800 Pure hypercholesterolemia, unspecified: Secondary | ICD-10-CM

## 2011-10-13 DIAGNOSIS — R0609 Other forms of dyspnea: Secondary | ICD-10-CM | POA: Insufficient documentation

## 2011-10-13 DIAGNOSIS — R0602 Shortness of breath: Secondary | ICD-10-CM

## 2011-10-13 DIAGNOSIS — I2510 Atherosclerotic heart disease of native coronary artery without angina pectoris: Secondary | ICD-10-CM

## 2011-10-13 DIAGNOSIS — R0789 Other chest pain: Secondary | ICD-10-CM | POA: Insufficient documentation

## 2011-10-13 DIAGNOSIS — R0989 Other specified symptoms and signs involving the circulatory and respiratory systems: Secondary | ICD-10-CM | POA: Insufficient documentation

## 2011-10-13 DIAGNOSIS — R61 Generalized hyperhidrosis: Secondary | ICD-10-CM | POA: Insufficient documentation

## 2011-10-13 DIAGNOSIS — E785 Hyperlipidemia, unspecified: Secondary | ICD-10-CM

## 2011-10-13 DIAGNOSIS — R079 Chest pain, unspecified: Secondary | ICD-10-CM

## 2011-10-13 DIAGNOSIS — R5381 Other malaise: Secondary | ICD-10-CM | POA: Insufficient documentation

## 2011-10-13 DIAGNOSIS — R110 Nausea: Secondary | ICD-10-CM | POA: Insufficient documentation

## 2011-10-13 DIAGNOSIS — I10 Essential (primary) hypertension: Secondary | ICD-10-CM | POA: Insufficient documentation

## 2011-10-13 MED ORDER — REGADENOSON 0.4 MG/5ML IV SOLN
0.4000 mg | Freq: Once | INTRAVENOUS | Status: AC
  Administered 2011-10-13: 0.4 mg via INTRAVENOUS

## 2011-10-13 MED ORDER — TECHNETIUM TC 99M TETROFOSMIN IV KIT
33.0000 | PACK | Freq: Once | INTRAVENOUS | Status: AC | PRN
  Administered 2011-10-13: 33 via INTRAVENOUS

## 2011-10-13 MED ORDER — TECHNETIUM TC 99M TETROFOSMIN IV KIT
11.0000 | PACK | Freq: Once | INTRAVENOUS | Status: AC | PRN
  Administered 2011-10-13: 11 via INTRAVENOUS

## 2011-10-13 NOTE — Progress Notes (Addendum)
[facility] SITE 3 NUCLEAR MED [address] [place] [address] [phone]  Cardiology Nuclear Med Study  [name] is a 78 y.o. female     MRN : [id number]     DOB: [birth year]  Procedure Date: 10/13/2011  Nuclear Med Background Indication for Stress Test:  Evaluation for Ischemia, Stent Patency and 10/07/11 Post Hospital with Chest pain; (-) enzymes History:  '06 AWMI>Stent-LAD; '08 MPS:No ischemia, large previous infarcti in distal halves of ventricles, EF=22%; '09 ICD; 10/08/11 Echo:EF=25-30%, peri-apical akinesis; h/o LV thrombus Cardiac Risk Factors: Hypertension and Lipids  Symptoms:  Chest Pressure/Tightness (last episode of chest discomfort:none since discharge), Diaphoresis, DOE, Fatigue and Nausea   Nuclear Pre-Procedure Caffeine/Decaff Intake:  None NPO After: 10:00pm   Lungs:  Clear. O2 Sat: 95% on room air. IV 0.9% NS with Angio Cath:  22g  IV Site: R Antecubital  IV Started by:  Stanton Kidney, EMT-P  Chest Size (in):  42 Cup Size: C  Height: 5' (1.524 m)  Weight:  150 lb (68.04 kg)  BMI:  Body mass index is 29.30 kg/(m^2). Tech Comments:  Med's were taken at 8am, per patient.    Nuclear Med Study 1 or 2 day study: 1 day  Stress Test Type:  Eugenie Birks  Reading MD: Olga Millers, MD  Order Authorizing Provider:  Marca Ancona, MD  Resting Radionuclide: Technetium 56m Tetrofosmin  Resting Radionuclide Dose: 11.0 mCi   Stress Radionuclide:  Technetium 49m Tetrofosmin  Stress Radionuclide Dose: 33.0 mCi           Stress Protocol Rest HR: 55 Stress HR: 80  Rest BP: 116/60 Stress BP: 115/63  Exercise Time (min): n/a METS: n/a   Predicted Max HR: 136 bpm % Max HR: 58.82 bpm Rate Pressure Product: 9200   Dose of Adenosine (mg):  n/a Dose of Lexiscan: 0.4 mg  Dose of Atropine (mg): n/a Dose of Dobutamine: n/a mcg/kg/min (at max HR)  Stress Test Technologist: Smiley Houseman, CMA-N  Nuclear Technologist:  Domenic Polite, CNMT     Rest  Procedure:  Myocardial perfusion imaging was performed at rest 45 minutes following the intravenous administration of Technetium 26m Tetrofosmin.  Rest ECG: LAFB with nonspecific T-wave changes.  Stress Procedure:  The patient received IV Lexiscan 0.4 mg over 15-seconds.  Technetium 2m Tetrofosmin injected at 30-seconds.  There were no significant changes with Lexiscan.  Quantitative spect images were obtained after a 45 minute delay.  Stress ECG: No significant ST segment change suggestive of ischemia.  QPS Raw Data Images:  Acquisition technically good; LVE. Stress Images:  There is decreased uptake in the apex and inferolateral wall. Rest Images:  There is decreased uptake in the apex and inferolateral wall; apical defect slightly less prominent compared to the stress images. Subtraction (SDS):  These findings are consistent with prior apical and inferolateral infarct; mild peri-infarct ischemia in the apex. Transient Ischemic Dilatation (Normal <1.22):  0.98 Lung/Heart Ratio (Normal <0.45):  0.55  Quantitative Gated Spect Images QGS EDV:  180 ml QGS ESV:  132 ml  Impression Exercise Capacity:  Lexiscan with no exercise. BP Response:  Normal blood pressure response. Clinical Symptoms:  There is dyspnea. ECG Impression:  No significant ST segment change suggestive of ischemia. Comparison with Prior Nuclear Study: No previous nuclear study performed  Overall Impression:  Abnormal stress nuclear study with a large, severe, fixed inferolateral defect consistent with previous infarct; there is also a large, severe, partially reversible defect in the apex consistent  with prior infarct and very mild peri-infarct ischemia.  LV Ejection Fraction: 27%.  LV Wall Motion:  Akinesis of the inferolateral wall and apex.  Olga Millers  Prior infarcts with minimal ischemia.  Likely similar to the past.  EF consistent with prior studies.  Please inform patient.   Marca Ancona 10/14/2011

## 2011-10-15 NOTE — Progress Notes (Signed)
Pt.notified

## 2011-10-16 ENCOUNTER — Encounter: Payer: Self-pay | Admitting: Cardiology

## 2011-10-17 ENCOUNTER — Encounter: Payer: Self-pay | Admitting: *Deleted

## 2011-10-17 ENCOUNTER — Other Ambulatory Visit: Payer: Self-pay | Admitting: *Deleted

## 2011-10-17 DIAGNOSIS — I5022 Chronic systolic (congestive) heart failure: Secondary | ICD-10-CM

## 2011-10-22 ENCOUNTER — Ambulatory Visit (INDEPENDENT_AMBULATORY_CARE_PROVIDER_SITE_OTHER): Payer: Medicare Other | Admitting: *Deleted

## 2011-10-22 ENCOUNTER — Encounter: Payer: Self-pay | Admitting: Physician Assistant

## 2011-10-22 ENCOUNTER — Other Ambulatory Visit (INDEPENDENT_AMBULATORY_CARE_PROVIDER_SITE_OTHER): Payer: Medicare Other

## 2011-10-22 ENCOUNTER — Ambulatory Visit (INDEPENDENT_AMBULATORY_CARE_PROVIDER_SITE_OTHER): Payer: Medicare Other | Admitting: Physician Assistant

## 2011-10-22 VITALS — BP 136/74 | HR 57 | Ht 60.0 in | Wt 150.0 lb

## 2011-10-22 DIAGNOSIS — N189 Chronic kidney disease, unspecified: Secondary | ICD-10-CM

## 2011-10-22 DIAGNOSIS — R0989 Other specified symptoms and signs involving the circulatory and respiratory systems: Secondary | ICD-10-CM

## 2011-10-22 DIAGNOSIS — I2510 Atherosclerotic heart disease of native coronary artery without angina pectoris: Secondary | ICD-10-CM

## 2011-10-22 DIAGNOSIS — I5022 Chronic systolic (congestive) heart failure: Secondary | ICD-10-CM

## 2011-10-22 DIAGNOSIS — E785 Hyperlipidemia, unspecified: Secondary | ICD-10-CM

## 2011-10-22 DIAGNOSIS — I253 Aneurysm of heart: Secondary | ICD-10-CM

## 2011-10-22 DIAGNOSIS — Z7901 Long term (current) use of anticoagulants: Secondary | ICD-10-CM

## 2011-10-22 DIAGNOSIS — I10 Essential (primary) hypertension: Secondary | ICD-10-CM

## 2011-10-22 LAB — BASIC METABOLIC PANEL
CO2: 26 mEq/L (ref 19–32)
Chloride: 105 mEq/L (ref 96–112)
Sodium: 140 mEq/L (ref 135–145)

## 2011-10-22 NOTE — Progress Notes (Signed)
[address] [place], [place]  [address] Phone: [phone] Fax:  [phone]  Date:  10/22/2011   Name:  [name]   DOB:  04-13-1925   MRN:  [id number]  PCP:  Pearson Grippe, MD  Primary Cardiologist:  Dr. Marca Ancona  Primary Electrophysiologist:  Dr. Lewayne Bunting    History of Present Illness: [name] is a 78 y.o. female who returns for post hospital follow up.  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    She was admitted 7/16-7/18 with chest pain. She ruled out for myocardial infarction by enzymes. Chest x-ray was nonacute. Echocardiogram 10/08/11: EF 25-30%, mid to apical anteroseptal akinesis, apical lateral, apical inferior and apical anterior AK, aneurysmal dilatation of the true apex, no LV clot noted, grade 1 diastolic dysfunction, trivial AI, mild MAC, moderate MR, mild LAE, PASP 30.  She was seen by Dr. Shirlee Latch. She was noted to have worse creatinine upon admission.  Given her chronic kidney disease, he planned to avoid cardiac catheterization if possible. Inpatient stress testing was arranged. However, this could not be completed. She was noted to be mildly volume overloaded and she was sent home a higher dose of Lasix. Myoview 10/13/11: Large, severe, fixed inferolateral defect consistent with previous infarct, large severe partial reversible defect in the apex consistent with prior infarct very mild peri-infarct ischemia, EF 27%. This was felt to be consistent with prior studies and reviewed by Dr. Shirlee Latch.  Follow up labs demonstrated elevated K+ and creatinine and her Lasix was changed to 20 mg QD, Spironolactone to 12.5 mg QD and Losartan to 25 mg QD.  She is doing well.  Dyspnea is stable.  Probably Class IIb-III.  No orthopnea, PND, edema.  No chest pain.  No syncope.  No cough.    Labs (12/11): K 4.1, creatinine 1.1  Labs (4/12): K 4.6, creatinine 1.2, BUN 43, BNP 271  Labs (6/12): K 4,  creatinine 1.3, LDL 66, HDL 44  Labs (10/12): K 4.8, creatinine 1.3  Labs (3/13): K 4.7, creatinine 1.4  Labs (4/13): K 4.6, creatinine 1.22, LDL 71, HDL 45  Labs (10/16/11):  K 5.3, BUN 61, creatinine 1.79  Wt Readings from Last 3 Encounters:  10/22/11 150 lb (68.04 kg)  10/13/11 150 lb (68.04 kg)  10/09/11 148 lb 11.2 oz (67.45 kg)     Past Medical History:  1. HYPERCHOLESTEROLEMIA (ICD-272.0)  2. ARTHRITIS (ICD-716.90): Severe low back pain.  3. ISCHEMIC CARDIOMYOPATHY (ICD-414.8): Echo (1/10) with EF 25-30%, mildly dilated LV, periapical LV aneurysm, calcified LV thrombus, moderate MR. Medtronic ICD set to VVI.  Echocardiogram 10/08/11: EF 25-30%, mid to apical anteroseptal akinesis, apical lateral, apical inferior and apical anterior AK, aneurysmal dilatation of the true apex, no LV clot noted, grade 1 diastolic dysfunction, trivial AI, mild MAC, moderate MR, mild LAE, PASP 30. 4. CAD (ICD-414.00): Anterior MI in 2006 with Cypher DES to LAD.  Myoview 10/13/11: Large, severe, fixed inferolateral defect consistent with previous infarct, large severe partial reversible defect in the apex consistent with prior infarct very mild peri-infarct ischemia, EF 27% - unchanged and med Rx continued. 5. HYPERTENSION (ICD-401.9)  6. LV thrombus on coumadin.  7. Macular degeneration  8. CKD   Current Outpatient Prescriptions  Medication Sig Dispense Refill  . aspirin EC 81 MG EC tablet Take 1 tablet (81 mg total) by mouth daily.      . carvedilol (COREG)  25 MG tablet Take 25 mg by mouth 2 (two) times daily with a meal.      . cholecalciferol (VITAMIN D) 1000 UNITS tablet Take 1,000 Units by mouth daily.        Marland Kitchen ezetimibe-simvastatin (VYTORIN) 10-40 MG per tablet Take 1 tablet by mouth daily.  30 tablet  6  . furosemide (LASIX) 20 MG tablet Take 20 mg by mouth daily.       . Glucosamine Sulfate 500 MG TABS Take by mouth daily.        Marland Kitchen losartan (COZAAR) 25 MG tablet (1/2)  50mg  tablet daily  90  tablet  3  . Multiple Vitamin (MULTIVITAMIN) tablet Take 1 tablet by mouth daily.        . nitroGLYCERIN (NITROSTAT) 0.4 MG SL tablet Place 1 tablet (0.4 mg total) under the tongue every 5 (five) minutes as needed.  30 tablet  2  . Omega-3 Fatty Acids (FISH OIL) 1000 MG CAPS Take by mouth daily.        . sertraline (ZOLOFT) 50 MG tablet Take 50 mg by mouth daily.       Marland Kitchen spironolactone (ALDACTONE) 25 MG tablet 1/2 tablet daily      . trimethoprim (TRIMPEX) 100 MG tablet Take 100 mg by mouth daily. For recurring UTIs      . warfarin (COUMADIN) 2.5 MG tablet Take 1.25-2.5 mg by mouth daily. Take 1 tab on Tuesday, Thursday, and Saturday. Then take 1/2 tab all other days        Allergies: Allergies  Allergen Reactions  . Codeine     History  Substance Use Topics  . Smoking status: Never Smoker   . Smokeless tobacco: Never Used  . Alcohol Use: No     ROS:  Please see the history of present illness.     All other systems reviewed and negative.   PHYSICAL EXAM: VS:  BP 136/74  Pulse 57  Ht 5' (1.524 m)  Wt 150 lb (68.04 kg)  BMI 29.30 kg/m2 Well nourished, well developed, in no acute distress HEENT: normal Neck: no JVD Cardiac:  normal S1, S2; RRR; no murmur Lungs:  clear to auscultation bilaterally, no wheezing, rhonchi or rales Abd: soft, nontender, no hepatomegaly Ext: trace bilateral LE edema Skin: warm and dry Neuro:  CNs 2-12 intact, no focal abnormalities noted  EKG:  Sinus bradycardia, heart rate 57, left axis deviation, T wave inversions and 1, aVL, V4-V6, no change from prior tracing      ASSESSMENT AND PLAN:  1.  CAD Stable.  No further chest pain. Myoview consistent with prior. Continue current medical Rx. Follow up with Dr. Marca Ancona in 3 mos.  2.  Chronic Systolic CHF in setting of Ischemic Cardiomyopathy Volume stable. Recent change in medications due to high K+ and worsening renal insufficiency. Follow up BMET today. Follow up with Dr. Marca Ancona in 3 mos.  3.  Apical Aneurysm with prior thrombus Remains on coumadin. Recent echo ok. Check INR today.  4.  Hypertension Controlled.  Continue current therapy.   5.  Hyperlipidemia Continue current Rx.  6.  Chronic Kidney Disease Check BMET today.  Signed, Tereso Newcomer, PA-C  12:19 PM 10/22/2011

## 2011-10-22 NOTE — Patient Instructions (Addendum)
Your physician recommends that you return for lab work in: TODAY BMET   YOU WILL HAVE YOUR INR CHECKED  PLEASE MAKE APPT TO SEE DR. MCLEAN IN 3 MONTHS

## 2011-10-23 ENCOUNTER — Telehealth: Payer: Self-pay | Admitting: *Deleted

## 2011-10-23 NOTE — Telephone Encounter (Signed)
Patient notified about lab results patient gave verbal understanding

## 2011-10-23 NOTE — Telephone Encounter (Signed)
Message copied by Tarri Fuller on Thu Oct 23, 2011  3:34 PM ------      Message from: [place], [place] T      Created: Wed Oct 22, 2011  5:57 PM       Labs ok      Continue with current treatment plan.      Tereso Newcomer, PA-C  5:57 PM 10/22/2011

## 2011-10-31 ENCOUNTER — Telehealth: Payer: Self-pay | Admitting: Cardiology

## 2011-10-31 ENCOUNTER — Other Ambulatory Visit: Payer: Self-pay | Admitting: Cardiology

## 2011-10-31 NOTE — Telephone Encounter (Signed)
I attempted to call the patient. Her home # is still ringing busy. I called her cell number and was able to reach her. She questioned which dose she should be taking on her spironolactone. Per her lab on 10/16/11, she was to decrease spironolactone to 25 mg 1/2 tablet by mouth daily. Her RX sent in today was for a whole tablet daily. I explained she should just take a 1/2 tablet daily and I will fix her medication list. She has a hard time splitting the pill, I advised her that the pharmacy can help her split these. She is agreeable.

## 2011-10-31 NOTE — Telephone Encounter (Signed)
Busy signal

## 2011-10-31 NOTE — Telephone Encounter (Signed)
New problem:  Need to discuss medication.  Recently pick up from pharmacy has question.

## 2011-11-04 MED FILL — Perflutren Lipid Microsphere IV Susp 6.52 MG/ML: INTRAVENOUS | Qty: 2 | Status: AC

## 2011-11-11 ENCOUNTER — Ambulatory Visit (INDEPENDENT_AMBULATORY_CARE_PROVIDER_SITE_OTHER): Payer: Medicare Other | Admitting: *Deleted

## 2011-11-11 DIAGNOSIS — Z7901 Long term (current) use of anticoagulants: Secondary | ICD-10-CM

## 2011-11-11 DIAGNOSIS — I253 Aneurysm of heart: Secondary | ICD-10-CM

## 2011-11-11 LAB — POCT INR: INR: 1.8

## 2011-11-13 ENCOUNTER — Telehealth: Payer: Self-pay | Admitting: *Deleted

## 2011-11-13 NOTE — Telephone Encounter (Signed)
That is fine 

## 2011-11-13 NOTE — Telephone Encounter (Signed)
LM at pt's request on voice ID voice mail  this is OK.

## 2011-11-13 NOTE — Telephone Encounter (Signed)
lower leg edema      Carmela Hurt, RN       Sent: Tue November 11, 2011 11:57 AM    To: Jacqlyn Krauss, RN     Flags: Call patient      [name]    MRN: [id number] DOB: 18-Jun-1925    Pt Home: [phone]               Message     Patient states she took an extra 1/2 tablet of lasix due to some lower leg edema around ankle area, this did resolve problem, she also has not had a weight gain. Anne please call her at her request, thanks, Addison Lank    11/13/11-spoke with pt. Pt states one day last week she has some swelling in her ankles. She denies increase in SOB or weight. She took extra 1/2 lasix and the swelling resolved. Pt is asking if OK to take extra 1/2 lasix prn if she has swelling in her ankles. I will forward to Dr Shirlee Latch for review and recommendations.

## 2011-11-25 ENCOUNTER — Ambulatory Visit (INDEPENDENT_AMBULATORY_CARE_PROVIDER_SITE_OTHER): Payer: Medicare Other | Admitting: *Deleted

## 2011-11-25 DIAGNOSIS — Z7901 Long term (current) use of anticoagulants: Secondary | ICD-10-CM

## 2011-11-25 DIAGNOSIS — I253 Aneurysm of heart: Secondary | ICD-10-CM

## 2011-11-27 ENCOUNTER — Other Ambulatory Visit: Payer: Self-pay | Admitting: Cardiology

## 2011-12-02 ENCOUNTER — Ambulatory Visit (INDEPENDENT_AMBULATORY_CARE_PROVIDER_SITE_OTHER): Payer: Medicare Other | Admitting: *Deleted

## 2011-12-02 DIAGNOSIS — I253 Aneurysm of heart: Secondary | ICD-10-CM

## 2011-12-02 DIAGNOSIS — Z7901 Long term (current) use of anticoagulants: Secondary | ICD-10-CM

## 2011-12-09 ENCOUNTER — Encounter: Payer: Self-pay | Admitting: Cardiology

## 2011-12-11 ENCOUNTER — Other Ambulatory Visit: Payer: Self-pay | Admitting: Internal Medicine

## 2011-12-18 ENCOUNTER — Ambulatory Visit (INDEPENDENT_AMBULATORY_CARE_PROVIDER_SITE_OTHER): Payer: Medicare Other | Admitting: *Deleted

## 2011-12-18 DIAGNOSIS — Z7901 Long term (current) use of anticoagulants: Secondary | ICD-10-CM

## 2011-12-18 DIAGNOSIS — I253 Aneurysm of heart: Secondary | ICD-10-CM

## 2011-12-30 ENCOUNTER — Encounter: Payer: Self-pay | Admitting: *Deleted

## 2012-01-08 ENCOUNTER — Ambulatory Visit (INDEPENDENT_AMBULATORY_CARE_PROVIDER_SITE_OTHER): Payer: Medicare Other | Admitting: Cardiology

## 2012-01-08 ENCOUNTER — Ambulatory Visit (INDEPENDENT_AMBULATORY_CARE_PROVIDER_SITE_OTHER): Payer: Medicare Other | Admitting: *Deleted

## 2012-01-08 ENCOUNTER — Encounter: Payer: Self-pay | Admitting: Cardiology

## 2012-01-08 VITALS — BP 125/60 | HR 64 | Ht 59.0 in | Wt 155.0 lb

## 2012-01-08 DIAGNOSIS — I5022 Chronic systolic (congestive) heart failure: Secondary | ICD-10-CM

## 2012-01-08 DIAGNOSIS — R0609 Other forms of dyspnea: Secondary | ICD-10-CM

## 2012-01-08 DIAGNOSIS — I2510 Atherosclerotic heart disease of native coronary artery without angina pectoris: Secondary | ICD-10-CM

## 2012-01-08 DIAGNOSIS — I2589 Other forms of chronic ischemic heart disease: Secondary | ICD-10-CM

## 2012-01-08 DIAGNOSIS — I253 Aneurysm of heart: Secondary | ICD-10-CM

## 2012-01-08 DIAGNOSIS — E785 Hyperlipidemia, unspecified: Secondary | ICD-10-CM

## 2012-01-08 DIAGNOSIS — I509 Heart failure, unspecified: Secondary | ICD-10-CM

## 2012-01-08 DIAGNOSIS — Z7901 Long term (current) use of anticoagulants: Secondary | ICD-10-CM

## 2012-01-08 DIAGNOSIS — R0989 Other specified symptoms and signs involving the circulatory and respiratory systems: Secondary | ICD-10-CM

## 2012-01-08 LAB — BASIC METABOLIC PANEL
Calcium: 8.9 mg/dL (ref 8.4–10.5)
GFR: 52.98 mL/min — ABNORMAL LOW (ref >60.00)
Potassium: 3.8 mEq/L (ref 3.5–5.1)
Sodium: 140 mEq/L (ref 135–145)

## 2012-01-08 LAB — ICD DEVICE OBSERVATION
DEV-0020ICD: NEGATIVE
PACEART VT: 0
RV LEAD THRESHOLD: 3 V
TZAT-0004FASTVT: 8
TZAT-0005SLOWVT: 84 pct
TZAT-0005SLOWVT: 91 pct
TZAT-0011FASTVT: 10 ms
TZAT-0011SLOWVT: 10 ms
TZAT-0011SLOWVT: 10 ms
TZAT-0012FASTVT: 200 ms
TZAT-0012SLOWVT: 200 ms
TZAT-0012SLOWVT: 200 ms
TZAT-0013FASTVT: 1
TZAT-0013SLOWVT: 2
TZAT-0018SLOWVT: NEGATIVE
TZAT-0018SLOWVT: NEGATIVE
TZAT-0019SLOWVT: 8 V
TZAT-0019SLOWVT: 8 V
TZAT-0020FASTVT: 1.6 ms
TZON-0003FASTVT: 240 ms
TZON-0003SLOWVT: 350 ms
TZON-0004SLOWVT: 20
TZON-0005SLOWVT: 12
TZON-0008FASTVT: 0 ms
TZON-0008SLOWVT: 0 ms
TZST-0001FASTVT: 3
TZST-0001FASTVT: 4
TZST-0001FASTVT: 5
TZST-0001SLOWVT: 4
TZST-0001SLOWVT: 6
TZST-0003FASTVT: 35 J
TZST-0003FASTVT: 35 J
TZST-0003SLOWVT: 35 J
TZST-0003SLOWVT: 35 J
VENTRICULAR PACING ICD: 0 pct

## 2012-01-08 LAB — BRAIN NATRIURETIC PEPTIDE: Pro B Natriuretic peptide (BNP): 171 pg/mL — ABNORMAL HIGH (ref 0.0–100.0)

## 2012-01-08 MED ORDER — FUROSEMIDE 20 MG PO TABS: ORAL_TABLET | ORAL | Status: DC

## 2012-01-08 NOTE — Progress Notes (Signed)
ICD check by industry for research 

## 2012-01-08 NOTE — Patient Instructions (Addendum)
**Note De-Identified  Obfuscation** Your physician has recommended you make the following change in your medication: increase Lasix to 40 mg in the mornings and 20 mg in the afternoons.  Your physician recommends that you return for lab work in: today and in 2 weeks  Your physician recommends that you schedule a follow-up appointment in: 2 weeks

## 2012-01-09 ENCOUNTER — Ambulatory Visit: Payer: Medicare Other | Admitting: Cardiology

## 2012-01-09 NOTE — Progress Notes (Signed)
Patient ID: [name], female   DOB: 02-08-1926, 78 y.o.   MRN: [id number] PCP: Dr. Selena Batten  78 yo with history of CAD, ischemic CMP and apical aneurysm with LV thrombus presents for cardiology followup.   Since I last saw her, she was admitted with chest pain in 7/13.  Echo showed EF 25-30% with apical aneurysm.  Lexiscan myoview showed a large fixed inferolateral defect and a large predominantly fixed apical defect.  There was no significant ischemia and she was managed medically.  She later developed increasing exertional dyspnea and was found to be volume overloaded. Lasix was increased and she was actually started on oxygen by her PCP.  She is wearing the oxygen today.    Currently, she is up 5 lbs since she was last seen in this office.  She is feeling better since increasing the Lasix and starting oxygen with less dyspnea.  She can walk around her house without shortness of breath but is not doing much more than this.  No orthopnea or PND.  No chest pain.    Labs (12/11): K 4.1, creatinine 1.1 Labs (4/12): K 4.6, creatinine 1.2, BUN 43, BNP 271 Labs (6/12): K 4, creatinine 1.3, LDL 66, HDL 44 Labs (10/12): K 4.8, creatinine 1.3 Labs (3/13): K 4.7, creatinine 1.4 Labs (4/13): K 4.6, creatinine 1.22, LDL 71, HDL 45 Labs (7/13): K 4.2, creatinine 1.2  Allergies (verified):  1)  ! Codeine 2)  ! Williams Che  Past Medical History: 1. HYPERCHOLESTEROLEMIA (ICD-272.0) 2. ARTHRITIS (ICD-716.90): Severe low back pain.  3. ISCHEMIC CARDIOMYOPATHY (ICD-414.8): Echo (1/10) with EF 25-30%, mildly dilated LV, periapical LV aneurysm, calcified LV thrombus, moderate MR.  Medtronic ICD set to VVI.  Echo (7/12) with EF 25-30%, mid-apical anteroseptal, apical lateral, apical anterior akinesis and aneurysm of the true apex; no thrombus noted, moderate MR, PA systolic pressure 30 mmHg.  4. CAD (ICD-414.00): Anterior MI in 2006 with Cypher DES to LAD.  Lexiscan myoview (7/13) with large fixed inferolateral  defect and large predominantly fixed apical defect.  There was minimal ischemia so she was managed medically.  5. HYPERTENSION (ICD-401.9) 6. LV thrombus on coumadin.  7. Macular degeneration 8. CKD  Social History: The patient lives alone near [place]. 3 daughters live nearby. Widowed Tobacco Use - No.  Alcohol Use - no  Family History:  CAD  ROS: All systems reviewed and negative except as per HPI.    Current Outpatient Prescriptions  Medication Sig Dispense Refill  . aspirin EC 81 MG EC tablet Take 1 tablet (81 mg total) by mouth daily.      . carvedilol (COREG) 25 MG tablet TAKE ONE TABLET BY MOUTH TWICE DAILY  60 tablet  10  . cholecalciferol (VITAMIN D) 1000 UNITS tablet Take 1,000 Units by mouth daily.        Marland Kitchen ezetimibe-simvastatin (VYTORIN) 10-40 MG per tablet Take 1 tablet by mouth daily.  30 tablet  6  . furosemide (LASIX) 20 MG tablet Take 40 mg in am and 20 mg in afternoon.  45 tablet  1  . Glucosamine Sulfate 500 MG TABS Take by mouth daily.        Marland Kitchen losartan (COZAAR) 25 MG tablet (1/2)  50mg  tablet daily  90 tablet  3  . Multiple Vitamin (MULTIVITAMIN) tablet Take 1 tablet by mouth daily.        . nitroGLYCERIN (NITROSTAT) 0.4 MG SL tablet Place 1 tablet (0.4 mg total) under the tongue every 5 (five) minutes  as needed.  30 tablet  2  . Omega-3 Fatty Acids (FISH OIL) 1000 MG CAPS Take by mouth daily.        . sertraline (ZOLOFT) 50 MG tablet Take 50 mg by mouth daily.       Marland Kitchen spironolactone (ALDACTONE) 25 MG tablet 1/2 tablet daily      . trimethoprim (TRIMPEX) 100 MG tablet Take 100 mg by mouth daily. For recurring UTIs      . warfarin (COUMADIN) 2.5 MG tablet Take 1.25-2.5 mg by mouth daily. Take 1 tab on Tuesday, Thursday, and Saturday. Then take 1/2 tab all other days      . warfarin (COUMADIN) 2.5 MG tablet TAKE AS DIRECTED BY COUMADIN CLINIC PHYSICIAN  30 tablet  3  . carvedilol (COREG) 25 MG tablet Take 25 mg by mouth 2 (two) times daily with a meal.         BP 125/60  Pulse 64  Ht 4\' 11"  (1.499 m)  Wt 155 lb (70.308 kg)  BMI 31.31 kg/m2 General:  Elderly woman in no distress.  Neck:  Neck supple, JVP 8-9 cm. No masses, thyromegaly or abnormal cervical nodes. Lungs:  L>R crackles at bases.  Heart:  Non-displaced PMI, chest non-tender; regular rate and rhythm, S1, S2 without rubs or gallops. 2/6 HSM at apex.  Carotid upstroke normal, no bruit.  Pedals normal pulses. 1+ ankle edema on right, trace edema on left.   Abdomen:  Bowel sounds positive; abdomen soft and non-tender without masses, organomegaly, or hernias noted. No hepatosplenomegaly. Extremities:  No clubbing or cyanosis. Neurologic:  Alert and oriented x 3. Psych:  Normal affect.  Assessment/Plan:  CAD   Stable. No chest pain. Recent myoview showed scar without significant ischemia.  Continue statin, ARB, beta blocker. She is on warfarin.  HYPERLIPIDEMIA LDL near goal (< 70) in 4/13.  Will need to repeat lipids next appointment.  Systolic CHF, chronic   NYHA class III symptoms. She does appear somewhat volume overloaded on exam and is still on home oxygen.  - Increase Lasix to 40 mg qam, 20 mg qpm.   - BMET/BNP today and in 2 wks.  - Followup in 2 wks.  - At followup, I will check her oxygen saturation off oxygen to see if she can stop it.  - Continue current losartan, spironolactone, and Coreg.  Creatinine has risen with prior attempts to uptitrate losartan.  VENTRICULAR ANEURYSM, LEFT  Apical aneurysm with thrombus, not seen on last echo. Continue warfarin. No falls but some gait instability. She is using her cane.   Marca Ancona 01/09/2012

## 2012-01-21 ENCOUNTER — Ambulatory Visit (INDEPENDENT_AMBULATORY_CARE_PROVIDER_SITE_OTHER): Payer: Medicare Other | Admitting: Cardiology

## 2012-01-21 ENCOUNTER — Encounter: Payer: Self-pay | Admitting: Cardiology

## 2012-01-21 VITALS — BP 120/80 | HR 62 | Ht 59.0 in | Wt 156.0 lb

## 2012-01-21 DIAGNOSIS — Z79899 Other long term (current) drug therapy: Secondary | ICD-10-CM

## 2012-01-21 DIAGNOSIS — I253 Aneurysm of heart: Secondary | ICD-10-CM

## 2012-01-21 DIAGNOSIS — E785 Hyperlipidemia, unspecified: Secondary | ICD-10-CM

## 2012-01-21 DIAGNOSIS — R0602 Shortness of breath: Secondary | ICD-10-CM

## 2012-01-21 DIAGNOSIS — I2510 Atherosclerotic heart disease of native coronary artery without angina pectoris: Secondary | ICD-10-CM

## 2012-01-21 DIAGNOSIS — I509 Heart failure, unspecified: Secondary | ICD-10-CM

## 2012-01-21 DIAGNOSIS — I5022 Chronic systolic (congestive) heart failure: Secondary | ICD-10-CM

## 2012-01-21 MED ORDER — FUROSEMIDE 20 MG PO TABS: ORAL_TABLET | ORAL | Status: DC

## 2012-01-21 NOTE — Patient Instructions (Signed)
Continue all current medications. Labs - due same day as next coumadin check on 02/05/2012.   Follow up in  1 month

## 2012-01-21 NOTE — Progress Notes (Signed)
Patient ID: [name], female   DOB: [birth year], 78 y.o.   MRN: [id number] PCP: Dr. Selena Batten  78 yo with history of CAD, ischemic CMP and apical aneurysm with LV thrombus presents for cardiology followup.   Since I last saw her, she was admitted with chest pain in 7/13.  Echo showed EF 25-30% with apical aneurysm.  Lexiscan myoview showed a large fixed inferolateral defect and a large predominantly fixed apical defect.  There was no significant ischemia and she was managed medically.  She later developed increasing exertional dyspnea and was found to be volume overloaded. Lasix was increased and she was actually started on oxygen by her PCP.   At last appointment, I increased her Lasix to 40 qam/20 qpm.  She feels like she is back to her baseline.  She is short of breath with dressing and showering but is able to walk around her house without problems.  No chest pain.  No orthopnea or PND.  Weight is stable.  She continues to live alone.  Oxygen saturation today on room air was 92% at rest and 92% with walking up and down the halls.   Labs (12/11): K 4.1, creatinine 1.1 Labs (4/12): K 4.6, creatinine 1.2, BUN 43, BNP 271 Labs (6/12): K 4, creatinine 1.3, LDL 66, HDL 44 Labs (10/12): K 4.8, creatinine 1.3 Labs (3/13): K 4.7, creatinine 1.4 Labs (4/13): K 4.6, creatinine 1.22, LDL 71, HDL 45 Labs (7/13): K 4.2, creatinine 1.2 Labs (10/13): K 3.8, creatinine 1.1, BNP 171  Allergies (verified):  1)  ! Codeine 2)  ! Williams Che  Past Medical History: 1. HYPERCHOLESTEROLEMIA (ICD-272.0) 2. ARTHRITIS (ICD-716.90): Severe low back pain.  3. ISCHEMIC CARDIOMYOPATHY (ICD-414.8): Echo (1/10) with EF 25-30%, mildly dilated LV, periapical LV aneurysm, calcified LV thrombus, moderate MR.  Medtronic ICD set to VVI.  Echo (7/12) with EF 25-30%, mid-apical anteroseptal, apical lateral, apical anterior akinesis and aneurysm of the true apex; no thrombus noted, moderate MR, PA systolic pressure 30 mmHg.  4. CAD  (ICD-414.00): Anterior MI in 2006 with Cypher DES to LAD.  Lexiscan myoview (7/13) with large fixed inferolateral defect and large predominantly fixed apical defect.  There was minimal ischemia so she was managed medically.  5. HYPERTENSION (ICD-401.9) 6. LV thrombus on coumadin.  7. Macular degeneration 8. CKD  Social History: The patient lives alone near [place]. 3 daughters live nearby. Widowed Tobacco Use - No.  Alcohol Use - no  Family History:  CAD  ROS: All systems reviewed and negative except as per HPI.    Current Outpatient Prescriptions  Medication Sig Dispense Refill  . aspirin EC 81 MG EC tablet Take 1 tablet (81 mg total) by mouth daily.      . carvedilol (COREG) 25 MG tablet Take 25 mg by mouth 2 (two) times daily with a meal.      . cholecalciferol (VITAMIN D) 1000 UNITS tablet Take 1,000 Units by mouth daily.        Marland Kitchen ezetimibe-simvastatin (VYTORIN) 10-40 MG per tablet Take 1 tablet by mouth daily.  30 tablet  6  . furosemide (LASIX) 20 MG tablet Take 2 tabs (40mg ) every morning & 1 tab (20mg ) every evening      . Glucosamine Sulfate 500 MG TABS Take by mouth daily.        Marland Kitchen losartan (COZAAR) 25 MG tablet (1/2)  50mg  tablet daily  90 tablet  3  . Multiple Vitamin (MULTIVITAMIN) tablet Take 1 tablet by mouth daily.        Marland Kitchen  nitroGLYCERIN (NITROSTAT) 0.4 MG SL tablet Place 1 tablet (0.4 mg total) under the tongue every 5 (five) minutes as needed.  30 tablet  2  . Omega-3 Fatty Acids (FISH OIL) 1000 MG CAPS Take by mouth daily.        . sertraline (ZOLOFT) 50 MG tablet Take 50 mg by mouth daily.       Marland Kitchen spironolactone (ALDACTONE) 25 MG tablet 1/2 tablet daily      . warfarin (COUMADIN) 2.5 MG tablet Take 1.25-2.5 mg by mouth daily. Take 1 tab on Tuesday, Thursday, and Saturday. Then take 1/2 tab all other days      . warfarin (COUMADIN) 2.5 MG tablet TAKE AS DIRECTED BY COUMADIN CLINIC PHYSICIAN  30 tablet  3  . DISCONTD: furosemide (LASIX) 20 MG tablet Take 40 mg in  am and 20 mg in afternoon.  45 tablet  1  . DISCONTD: furosemide (LASIX) 20 MG tablet Take 20 mg by mouth 2 (two) times daily. Take 1 tablet in AM and 1/2 tablet in PM.      . DISCONTD: carvedilol (COREG) 25 MG tablet TAKE ONE TABLET BY MOUTH TWICE DAILY  60 tablet  10    BP 120/80  Pulse 62  Ht 4\' 11"  (1.499 m)  Wt 156 lb (70.761 kg)  BMI 31.51 kg/m2  SpO2 91% General:  Elderly woman in no distress.  Neck:  Neck supple, JVP 8 cm. No masses, thyromegaly or abnormal cervical nodes. Lungs:  L>R crackles at bases.  Heart:  Non-displaced PMI, chest non-tender; regular rate and rhythm, S1, S2 without rubs or gallops. 2/6 HSM at apex.  Carotid upstroke normal, no bruit.  Pedals normal pulses. No peripheral edema. Abdomen:  Bowel sounds positive; abdomen soft and non-tender without masses, organomegaly, or hernias noted. No hepatosplenomegaly. Extremities:  No clubbing or cyanosis. Neurologic:  Alert and oriented x 3. Psych:  Normal affect.  Assessment/Plan:  CAD   Stable. No chest pain. Recent myoview showed scar without significant ischemia.  Continue statin, ARB, beta blocker. She is on warfarin.  HYPERLIPIDEMIA LDL near goal (< 70) in 4/13.  I will get repeat lipids when she returns for her next INR check.  Systolic CHF, chronic   NYHA class III symptoms, back to baseline. Volume status looks better.  Oxygen saturation 92% at rest and with mild exertion on room air. - Continue Lasix 40 qam, 20 qpm.   - I will get a BMET/BNP when she returns for her next INR check.  - Followup in 1 month.  - She can start using oxygen only at night and prn given reasonable oxygen saturation on room air today.  - Continue current losartan, spironolactone, and Coreg.  Creatinine has risen with prior attempts to uptitrate losartan.  VENTRICULAR ANEURYSM, LEFT  Apical aneurysm with thrombus, not seen on last echo. Continue warfarin. No falls but some gait instability. She is using her cane.   Marca Ancona 01/21/2012

## 2012-01-22 ENCOUNTER — Other Ambulatory Visit: Payer: Self-pay | Admitting: Cardiology

## 2012-01-26 ENCOUNTER — Encounter: Payer: Self-pay | Admitting: Internal Medicine

## 2012-02-05 ENCOUNTER — Other Ambulatory Visit (INDEPENDENT_AMBULATORY_CARE_PROVIDER_SITE_OTHER): Payer: Medicare Other

## 2012-02-05 ENCOUNTER — Telehealth: Payer: Self-pay

## 2012-02-05 ENCOUNTER — Ambulatory Visit (INDEPENDENT_AMBULATORY_CARE_PROVIDER_SITE_OTHER): Payer: Medicare Other

## 2012-02-05 DIAGNOSIS — Z7901 Long term (current) use of anticoagulants: Secondary | ICD-10-CM

## 2012-02-05 DIAGNOSIS — I253 Aneurysm of heart: Secondary | ICD-10-CM

## 2012-02-05 DIAGNOSIS — I5022 Chronic systolic (congestive) heart failure: Secondary | ICD-10-CM

## 2012-02-05 DIAGNOSIS — E785 Hyperlipidemia, unspecified: Secondary | ICD-10-CM

## 2012-02-05 LAB — BASIC METABOLIC PANEL
BUN: 39 mg/dL — ABNORMAL HIGH (ref 6–23)
Chloride: 103 mEq/L (ref 96–112)
Glucose, Bld: 129 mg/dL — ABNORMAL HIGH (ref 70–99)
Potassium: 4 mEq/L (ref 3.5–5.1)

## 2012-02-05 LAB — LIPID PANEL
Cholesterol: 162 mg/dL (ref 0–200)
HDL: 46.2 mg/dL (ref >39.00)
LDL Cholesterol: 80 mg/dL (ref 0–99)
VLDL: 36 mg/dL (ref 0.0–40.0)

## 2012-02-05 NOTE — Telephone Encounter (Signed)
Pt seen in [facility] today reports being awoken last night at 3am with chest pain/pressure took NTG with relief and drifted off back to sleep.  Denies any SOB, nausea, vomiting, diaphoresis, or arm pain associated with CP.  Pt reports a second episode of CP/pressure after 4am, she took NTG with relief and states she has had no further episodes since then.  Pt reports feeling fine at the present, denies any chest pain or discomfort now.  Advised pt I would forward a note to Dr Shirlee Latch just to make aware.  Advised pt to continue to monitor and take NTG prn for pain upto 1 tablet every 5 mins x 3 for persistant pain.  If CP persists after taking 3 tablets consecutively seek immediate medical help, call 911.

## 2012-02-05 NOTE — Telephone Encounter (Signed)
Patient called stated she has not had any chest pain since last night.Advised to keep appointment with Dr.McLean 02/18/12 in the [place] office.Advised if continues to have frequent episodes of chest pain to call office sooner and go to ER if needed.

## 2012-02-05 NOTE — Telephone Encounter (Signed)
Patient called no answer.LMTC. 

## 2012-02-09 ENCOUNTER — Ambulatory Visit: Payer: Medicare Other | Admitting: Cardiology

## 2012-02-17 ENCOUNTER — Other Ambulatory Visit: Payer: Self-pay | Admitting: Cardiology

## 2012-02-18 ENCOUNTER — Ambulatory Visit (INDEPENDENT_AMBULATORY_CARE_PROVIDER_SITE_OTHER): Payer: Medicare Other | Admitting: Cardiology

## 2012-02-18 ENCOUNTER — Encounter: Payer: Self-pay | Admitting: Cardiology

## 2012-02-18 VITALS — BP 166/82 | HR 68 | Ht 59.0 in | Wt 154.0 lb

## 2012-02-18 DIAGNOSIS — I2510 Atherosclerotic heart disease of native coronary artery without angina pectoris: Secondary | ICD-10-CM

## 2012-02-18 DIAGNOSIS — Z79899 Other long term (current) drug therapy: Secondary | ICD-10-CM

## 2012-02-18 DIAGNOSIS — I5022 Chronic systolic (congestive) heart failure: Secondary | ICD-10-CM

## 2012-02-18 DIAGNOSIS — I219 Acute myocardial infarction, unspecified: Secondary | ICD-10-CM

## 2012-02-18 DIAGNOSIS — I509 Heart failure, unspecified: Secondary | ICD-10-CM

## 2012-02-18 DIAGNOSIS — E785 Hyperlipidemia, unspecified: Secondary | ICD-10-CM

## 2012-02-18 DIAGNOSIS — I513 Intracardiac thrombosis, not elsewhere classified: Secondary | ICD-10-CM

## 2012-02-18 DIAGNOSIS — E7800 Pure hypercholesterolemia, unspecified: Secondary | ICD-10-CM

## 2012-02-18 MED ORDER — SPIRONOLACTONE 25 MG PO TABS: 25.0000 mg | ORAL_TABLET | Freq: Every day | ORAL | Status: DC

## 2012-02-18 MED ORDER — ATORVASTATIN CALCIUM 40 MG PO TABS: 40.0000 mg | ORAL_TABLET | Freq: Every evening | ORAL | Status: DC

## 2012-02-18 NOTE — Patient Instructions (Signed)
   Increase Spironolactone to 25mg  daily  Stop Vytorin  Begin Atorvastatin 40mg  every evening  Lab in 2 months for fasting lipid and liver panel  Lab in 7-10 days for [employer] will contact with results Continue all other current medications. Follow up in  2 months

## 2012-02-18 NOTE — Progress Notes (Signed)
Patient ID: [name], female   DOB: 20-Mar-1926, 78 y.o.   MRN: [id number] PCP: Dr. Selena Batten  78 yo with history of CAD, ischemic CMP and apical aneurysm with LV thrombus presents for cardiology followup.  She was admitted with chest pain in 7/13.  Echo showed EF 25-30% with apical aneurysm.  Lexiscan myoview showed a large fixed inferolateral defect and a large predominantly fixed apical defect.  There was no significant ischemia and she was managed medically.  She later developed increasing exertional dyspnea and was found to be volume overloaded. Lasix was increased and she was actually started on oxygen by her PCP.   Today, she feels like she is at her baseline. She is short of breath with dressing and showering but is able to walk around her house without problems.  No chest pain.  No orthopnea or PND.  Weight is down 2 lbs.  She continues to live alone.  She is now only wearing oxygen at night.  Oxygen saturation is 94% on RA today.  BP is high today but this is unusual.  She checks at home frequently, and pressure usually runs < 130 systolic.   Labs (12/11): K 4.1, creatinine 1.1 Labs (4/12): K 4.6, creatinine 1.2, BUN 43, BNP 271 Labs (6/12): K 4, creatinine 1.3, LDL 66, HDL 44 Labs (10/12): K 4.8, creatinine 1.3 Labs (3/13): K 4.7, creatinine 1.4 Labs (4/13): K 4.6, creatinine 1.22, LDL 71, HDL 45 Labs (7/13): K 4.2, creatinine 1.2 Labs (10/13): K 3.8, creatinine 1.1, BNP 171 Labs (11/13): K 4, creatinine 1.2, LDL 80, HDL 46  Allergies (verified):  1)  ! Codeine 2)  ! Williams Che  Past Medical History: 1. HYPERCHOLESTEROLEMIA (ICD-272.0) 2. ARTHRITIS (ICD-716.90): Severe low back pain.  3. ISCHEMIC CARDIOMYOPATHY (ICD-414.8): Echo (1/10) with EF 25-30%, mildly dilated LV, periapical LV aneurysm, calcified LV thrombus, moderate MR.  Medtronic ICD set to VVI.  Echo (7/12) with EF 25-30%, mid-apical anteroseptal, apical lateral, apical anterior akinesis and aneurysm of the true apex; no  thrombus noted, moderate MR, PA systolic pressure 30 mmHg.  4. CAD (ICD-414.00): Anterior MI in 2006 with Cypher DES to LAD.  Lexiscan myoview (7/13) with large fixed inferolateral defect and large predominantly fixed apical defect.  There was minimal ischemia so she was managed medically.  5. HYPERTENSION (ICD-401.9) 6. LV thrombus on coumadin.  7. Macular degeneration 8. CKD  Social History: The patient lives alone near [place]. 3 daughters live nearby. Widowed Tobacco Use - No.  Alcohol Use - no  Family History:  CAD  ROS: All systems reviewed and negative except as per HPI.    Current Outpatient Prescriptions  Medication Sig Dispense Refill  . aspirin EC 81 MG EC tablet Take 1 tablet (81 mg total) by mouth daily.      . carvedilol (COREG) 25 MG tablet Take 25 mg by mouth 2 (two) times daily with a meal.      . cholecalciferol (VITAMIN D) 1000 UNITS tablet Take 1,000 Units by mouth daily.        Marland Kitchen ezetimibe-simvastatin (VYTORIN) 10-40 MG per tablet Take 1 tablet by mouth daily.  30 tablet  6  . furosemide (LASIX) 20 MG tablet Take 2 tabs (40mg ) every morning & 1 tab (20mg ) every evening      . furosemide (LASIX) 20 MG tablet TAKE 2 TABLETS IN THE MORNING AND 1 TABLET IN THE EVENING  90 tablet  1  . Glucosamine Sulfate 500 MG TABS Take by mouth  daily.        . losartan (COZAAR) 25 MG tablet (1/2)  50mg  tablet daily  90 tablet  3  . losartan (COZAAR) 50 MG tablet TAKE ONE TABLET BY MOUTH ONE TIME DAILY  30 tablet  6  . Multiple Vitamin (MULTIVITAMIN) tablet Take 1 tablet by mouth daily.        . nitroGLYCERIN (NITROSTAT) 0.4 MG SL tablet Place 1 tablet (0.4 mg total) under the tongue every 5 (five) minutes as needed.  30 tablet  2  . Omega-3 Fatty Acids (FISH OIL) 1000 MG CAPS Take by mouth daily.        . sertraline (ZOLOFT) 50 MG tablet Take 50 mg by mouth daily.       Marland Kitchen spironolactone (ALDACTONE) 25 MG tablet 1/2 tablet daily      . warfarin (COUMADIN) 2.5 MG tablet Take  1.25-2.5 mg by mouth daily. Take 1 tab on Tuesday, Thursday, and Saturday. Then take 1/2 tab all other days      . warfarin (COUMADIN) 2.5 MG tablet TAKE AS DIRECTED BY COUMADIN CLINIC PHYSICIAN  30 tablet  3    BP 166/82  Pulse 68  Ht 4\' 11"  (1.499 m)  Wt 154 lb (69.854 kg)  BMI 31.10 kg/m2  SpO2 94% General:  Elderly woman in no distress.  Neck:  Neck supple, JVP not elevated. No masses, thyromegaly or abnormal cervical nodes. Lungs:  Clear bilaterally. Heart:  Non-displaced PMI, chest non-tender; regular rate and rhythm, S1, S2 without rubs or gallops. 2/6 HSM at apex.  Carotid upstroke normal, no bruit.  Pedals normal pulses. No peripheral edema. Abdomen:  Bowel sounds positive; abdomen soft and non-tender without masses, organomegaly, or hernias noted. No hepatosplenomegaly. Extremities:  No clubbing or cyanosis. Neurologic:  Alert and oriented x 3. Psych:  Normal affect.  Assessment/Plan:  CAD   Stable. No chest pain. Recent myoview showed scar without significant ischemia.  Continue statin, ARB, beta blocker. She is on warfarin.  HYPERLIPIDEMIA Good lipids in 11/13.  She wants to stop Vytorin due to expense.  I will let her try atorvastatin 40 mg daily.  Will get lipids/LFTs 2 months after starting atorvastatin. Systolic CHF, chronic   NYHA class III symptoms, at baseline. Looks euvolemic, good oxygen saturation. - Continue Lasix 40 qam, 20 qpm.   - I will increase spironolactone to 25 mg daily.  BMET in 10 days.  - Continue current losartan and Coreg.  Creatinine has risen with prior attempts to uptitrate losartan.  VENTRICULAR ANEURYSM, LEFT  Apical aneurysm with thrombus, not seen on last echo. Continue warfarin. No falls but some gait instability. She is using her cane.   Marca Ancona 02/18/2012

## 2012-03-05 ENCOUNTER — Ambulatory Visit (INDEPENDENT_AMBULATORY_CARE_PROVIDER_SITE_OTHER): Payer: Medicare Other | Admitting: *Deleted

## 2012-03-05 DIAGNOSIS — I253 Aneurysm of heart: Secondary | ICD-10-CM

## 2012-03-05 DIAGNOSIS — Z7901 Long term (current) use of anticoagulants: Secondary | ICD-10-CM

## 2012-03-05 LAB — POCT INR: INR: 2

## 2012-03-22 ENCOUNTER — Telehealth: Payer: Self-pay | Admitting: Cardiology

## 2012-03-22 DIAGNOSIS — I5022 Chronic systolic (congestive) heart failure: Secondary | ICD-10-CM

## 2012-03-22 DIAGNOSIS — E785 Hyperlipidemia, unspecified: Secondary | ICD-10-CM

## 2012-03-22 NOTE — Telephone Encounter (Signed)
Spoke with pt. Pt will have BMET 03/29/12 when she has device check. Pt will have fasting lipid/liver profile 04/28/12 when she sees Dr Shirlee Latch.

## 2012-03-22 NOTE — Telephone Encounter (Signed)
Pt wants to talk about medication changes and labs regarding when she needs to do lab work and she wants to come for that when she come for defib check

## 2012-03-29 ENCOUNTER — Encounter: Payer: Self-pay | Admitting: Internal Medicine

## 2012-03-29 ENCOUNTER — Ambulatory Visit (INDEPENDENT_AMBULATORY_CARE_PROVIDER_SITE_OTHER): Payer: Medicare Other | Admitting: *Deleted

## 2012-03-29 ENCOUNTER — Other Ambulatory Visit (INDEPENDENT_AMBULATORY_CARE_PROVIDER_SITE_OTHER): Payer: Medicare Other

## 2012-03-29 DIAGNOSIS — E785 Hyperlipidemia, unspecified: Secondary | ICD-10-CM

## 2012-03-29 DIAGNOSIS — Z95810 Presence of automatic (implantable) cardiac defibrillator: Secondary | ICD-10-CM

## 2012-03-29 DIAGNOSIS — I5022 Chronic systolic (congestive) heart failure: Secondary | ICD-10-CM

## 2012-03-29 DIAGNOSIS — I472 Ventricular tachycardia, unspecified: Secondary | ICD-10-CM

## 2012-03-29 LAB — ICD DEVICE OBSERVATION
BATTERY VOLTAGE: 2.78 V
DEV-0020ICD: NEGATIVE
TZAT-0001SLOWVT: 2
TZAT-0004FASTVT: 8
TZAT-0004SLOWVT: 8
TZAT-0005FASTVT: 88 pct
TZAT-0005SLOWVT: 84 pct
TZAT-0011FASTVT: 10 ms
TZAT-0011SLOWVT: 10 ms
TZAT-0012FASTVT: 200 ms
TZAT-0012SLOWVT: 200 ms
TZAT-0013SLOWVT: 2
TZAT-0018SLOWVT: NEGATIVE
TZAT-0019SLOWVT: 8 V
TZAT-0019SLOWVT: 8 V
TZAT-0020SLOWVT: 1.6 ms
TZAT-0020SLOWVT: 1.6 ms
TZON-0003FASTVT: 240 ms
TZON-0005SLOWVT: 12
TZON-0011AFLUTTER: 70
TZST-0001FASTVT: 3
TZST-0001FASTVT: 4
TZST-0001SLOWVT: 3
TZST-0001SLOWVT: 5
TZST-0003FASTVT: 25 J
TZST-0003FASTVT: 35 J
TZST-0003SLOWVT: 25 J
TZST-0003SLOWVT: 35 J

## 2012-03-29 LAB — BASIC METABOLIC PANEL
BUN: 28 mg/dL — ABNORMAL HIGH (ref 6–23)
Chloride: 104 mEq/L (ref 96–112)
Creatinine, Ser: 1.2 mg/dL (ref 0.4–1.2)
GFR: 45.83 mL/min — ABNORMAL LOW (ref >60.00)
Glucose, Bld: 126 mg/dL — ABNORMAL HIGH (ref 70–99)
Potassium: 3.9 mEq/L (ref 3.5–5.1)

## 2012-03-29 NOTE — Progress Notes (Signed)
Pt seen in clinic for follow up of ICD.  No complaints of chest pain, shortness of breath, dizziness, palpitations, or shocks.  Device functioning normally at this time.  For full details, see [facility] report.  No programming changes made today.  Plan to follow up in 3 months with Dr Ladona Ridgel.   Gypsy Balsam, RN, BSN 03/29/2012 11:16 AM

## 2012-04-06 ENCOUNTER — Encounter: Payer: Self-pay | Admitting: *Deleted

## 2012-04-13 ENCOUNTER — Ambulatory Visit (INDEPENDENT_AMBULATORY_CARE_PROVIDER_SITE_OTHER): Payer: Medicare Other

## 2012-04-13 DIAGNOSIS — Z7901 Long term (current) use of anticoagulants: Secondary | ICD-10-CM

## 2012-04-13 DIAGNOSIS — I253 Aneurysm of heart: Secondary | ICD-10-CM

## 2012-04-14 ENCOUNTER — Other Ambulatory Visit: Payer: Self-pay | Admitting: *Deleted

## 2012-04-14 ENCOUNTER — Other Ambulatory Visit: Payer: Self-pay | Admitting: Internal Medicine

## 2012-04-14 ENCOUNTER — Other Ambulatory Visit: Payer: Self-pay | Admitting: Cardiology

## 2012-04-26 ENCOUNTER — Encounter: Payer: Self-pay | Admitting: Cardiology

## 2012-04-26 ENCOUNTER — Other Ambulatory Visit (INDEPENDENT_AMBULATORY_CARE_PROVIDER_SITE_OTHER): Payer: Medicare Other

## 2012-04-26 ENCOUNTER — Ambulatory Visit (INDEPENDENT_AMBULATORY_CARE_PROVIDER_SITE_OTHER): Payer: Medicare Other | Admitting: Cardiology

## 2012-04-26 VITALS — BP 142/70 | HR 65 | Ht 59.0 in | Wt 155.0 lb

## 2012-04-26 DIAGNOSIS — E7800 Pure hypercholesterolemia, unspecified: Secondary | ICD-10-CM

## 2012-04-26 DIAGNOSIS — I509 Heart failure, unspecified: Secondary | ICD-10-CM

## 2012-04-26 DIAGNOSIS — E785 Hyperlipidemia, unspecified: Secondary | ICD-10-CM

## 2012-04-26 DIAGNOSIS — R0989 Other specified symptoms and signs involving the circulatory and respiratory systems: Secondary | ICD-10-CM

## 2012-04-26 DIAGNOSIS — I2510 Atherosclerotic heart disease of native coronary artery without angina pectoris: Secondary | ICD-10-CM

## 2012-04-26 DIAGNOSIS — I253 Aneurysm of heart: Secondary | ICD-10-CM

## 2012-04-26 DIAGNOSIS — I5022 Chronic systolic (congestive) heart failure: Secondary | ICD-10-CM

## 2012-04-26 LAB — HEPATIC FUNCTION PANEL
ALT: 18 U/L (ref 0–35)
Alkaline Phosphatase: 61 U/L (ref 39–117)
Bilirubin, Direct: 0 mg/dL (ref 0.0–0.3)
Total Bilirubin: 0.5 mg/dL (ref 0.3–1.2)

## 2012-04-26 LAB — LIPID PANEL
LDL Cholesterol: 72 mg/dL (ref 0–99)
Total CHOL/HDL Ratio: 3
Triglycerides: 74 mg/dL (ref 0.0–149.0)

## 2012-04-26 NOTE — Patient Instructions (Signed)
Stop aspirin.   Your physician recommends that you return for a FASTING lipid profile /liver profile today.   Your physician recommends that you schedule a follow-up appointment in: 3 months with Dr Shirlee Latch.  Your physician recommends that you return for lab work in: 3 months when you see Dr McLean--BMET/BNP.

## 2012-04-26 NOTE — Progress Notes (Signed)
Patient ID: [name], female   DOB: 1925-05-15, 79 y.o.   MRN: [id number] PCP: Dr. Selena Batten  79 yo with history of CAD, ischemic CMP and apical aneurysm with LV thrombus presents for cardiology followup. Last echo in 7/13 showed EF 25-30% with apical aneurysm.  Lexiscan myoview in 7/13 showed a large fixed inferolateral defect and a large predominantly fixed apical defect.  There was no significant ischemia and she was managed medically.   Today, she feels like she is at her baseline. She is short of breath after walking 100-150 feet.  She had one episode of chest pain while lying in bed at night after a late dinner.  She thinks this may have been GERD.  No exertional chest pain.  No orthopnea or PND.  Weight is stable.  She continues to live alone.  She is now only wearing oxygen at night.    Labs (12/11): K 4.1, creatinine 1.1 Labs (4/12): K 4.6, creatinine 1.2, BUN 43, BNP 271 Labs (6/12): K 4, creatinine 1.3, LDL 66, HDL 44 Labs (10/12): K 4.8, creatinine 1.3 Labs (3/13): K 4.7, creatinine 1.4 Labs (4/13): K 4.6, creatinine 1.22, LDL 71, HDL 45 Labs (7/13): K 4.2, creatinine 1.2 Labs (10/13): K 3.8, creatinine 1.1, BNP 171 Labs (11/13): K 4, creatinine 1.2, LDL 80, HDL 46 Labs (1/14): K 3.9, creatinine 1.2  Allergies (verified):  1)  ! Codeine 2)  ! Williams Che  Past Medical History: 1. HYPERCHOLESTEROLEMIA (ICD-272.0) 2. ARTHRITIS (ICD-716.90): Severe low back pain.  3. ISCHEMIC CARDIOMYOPATHY (ICD-414.8): Echo (1/10) with EF 25-30%, mildly dilated LV, periapical LV aneurysm, calcified LV thrombus, moderate MR.  Medtronic ICD set to VVI.  Echo (7/12) with EF 25-30%, mid-apical anteroseptal, apical lateral, apical anterior akinesis and aneurysm of the true apex; no thrombus noted, moderate MR, PA systolic pressure 30 mmHg.  4. CAD (ICD-414.00): Anterior MI in 2006 with Cypher DES to LAD.  Lexiscan myoview (7/13) with large fixed inferolateral defect and large predominantly fixed apical  defect.  There was minimal ischemia so she was managed medically.  5. HYPERTENSION (ICD-401.9) 6. LV thrombus on coumadin.  7. Macular degeneration 8. CKD  Social History: The patient lives alone near [place]. 3 daughters live nearby. Widowed Tobacco Use - No.  Alcohol Use - no  Family History:  CAD  ROS: All systems reviewed and negative except as per HPI.    Current Outpatient Prescriptions  Medication Sig Dispense Refill  . atorvastatin (LIPITOR) 40 MG tablet Take 1 tablet (40 mg total) by mouth every evening.  30 tablet  6  . carvedilol (COREG) 25 MG tablet Take 25 mg by mouth 2 (two) times daily with a meal.      . cholecalciferol (VITAMIN D) 1000 UNITS tablet Take 1,000 Units by mouth daily.        . furosemide (LASIX) 20 MG tablet TAKE 2 TABLETS BY MOUTH EVERY MORNING AND 1 TABLET EVERY EVENING  90 tablet  0  . Glucosamine Sulfate 500 MG TABS Take by mouth daily.        Marland Kitchen losartan (COZAAR) 25 MG tablet (1/2)  50mg  tablet daily  90 tablet  3  . Multiple Vitamin (MULTIVITAMIN) tablet Take 1 tablet by mouth daily.        . nitroGLYCERIN (NITROSTAT) 0.4 MG SL tablet Place 1 tablet (0.4 mg total) under the tongue every 5 (five) minutes as needed.  30 tablet  2  . Omega-3 Fatty Acids (FISH OIL) 1000 MG CAPS Take  by mouth daily.        . sertraline (ZOLOFT) 50 MG tablet Take 50 mg by mouth daily.       Marland Kitchen spironolactone (ALDACTONE) 25 MG tablet Take 1 tablet (25 mg total) by mouth daily.  30 tablet  6  . warfarin (COUMADIN) 2.5 MG tablet TAKE AS DIRECTED BY COUMADIN  CLINIC PHYSICIAN  30 tablet  2    BP 142/70  Pulse 65  Ht 4\' 11"  (1.499 m)  Wt 155 lb (70.308 kg)  BMI 31.31 kg/m2 General:  Elderly woman in no distress.  Neck:  Neck supple, JVP not elevated. No masses, thyromegaly or abnormal cervical nodes. Lungs:  Clear bilaterally. Heart:  Non-displaced PMI, chest non-tender; regular rate and rhythm, S1, S2 without rubs or gallops. 2/6 HSM at apex.  Carotid upstroke normal,  no bruit.  Pedals normal pulses. No peripheral edema. Abdomen:  Bowel sounds positive; abdomen soft and non-tender without masses, organomegaly, or hernias noted. No hepatosplenomegaly. Extremities:  No clubbing or cyanosis. Neurologic:  Alert and oriented x 3. Psych:  Normal affect.  Assessment/Plan:  1. CAD   Stable. One episode of atypical chest pain that seems most likely to have been GERD.  Myoview in 7/13 showed scar without significant ischemia.  Continue statin, ARB, beta blocker. She is on warfarin so she does not need to take ASA 81.  2. HYPERLIPIDEMIA She switched from Vytorin to atorvastatin.  Will get lipids/LFTs today.  3. Systolic CHF, chronic   NYHA class III symptoms, at baseline. Looks euvolemic. - Continue Lasix 40 qam, 20 qpm.   - Continue current spironolactone, losartan and Coreg.  Creatinine has risen with prior attempts to uptitrate losartan.  4. VENTRICULAR ANEURYSM, LEFT  Apical aneurysm with thrombus, not seen on last echo. Continue warfarin. No falls but some gait instability. She is using her cane.   Marca Ancona 04/26/2012

## 2012-04-27 ENCOUNTER — Ambulatory Visit: Payer: Medicare Other | Admitting: Cardiology

## 2012-04-28 ENCOUNTER — Other Ambulatory Visit: Payer: Medicare Other

## 2012-04-28 ENCOUNTER — Ambulatory Visit: Payer: Medicare Other | Admitting: Cardiology

## 2012-05-13 ENCOUNTER — Other Ambulatory Visit: Payer: Self-pay | Admitting: Cardiology

## 2012-05-26 ENCOUNTER — Ambulatory Visit (INDEPENDENT_AMBULATORY_CARE_PROVIDER_SITE_OTHER): Payer: Medicare Other | Admitting: *Deleted

## 2012-05-26 DIAGNOSIS — Z7901 Long term (current) use of anticoagulants: Secondary | ICD-10-CM

## 2012-05-26 DIAGNOSIS — I253 Aneurysm of heart: Secondary | ICD-10-CM

## 2012-06-24 ENCOUNTER — Ambulatory Visit (INDEPENDENT_AMBULATORY_CARE_PROVIDER_SITE_OTHER): Payer: Medicare Other | Admitting: Cardiology

## 2012-06-24 ENCOUNTER — Encounter: Payer: Self-pay | Admitting: Cardiology

## 2012-06-24 ENCOUNTER — Ambulatory Visit (INDEPENDENT_AMBULATORY_CARE_PROVIDER_SITE_OTHER): Payer: Medicare Other | Admitting: *Deleted

## 2012-06-24 ENCOUNTER — Encounter: Payer: Self-pay | Admitting: Internal Medicine

## 2012-06-24 VITALS — BP 142/64 | HR 65 | Ht 59.0 in | Wt 155.0 lb

## 2012-06-24 DIAGNOSIS — I472 Ventricular tachycardia: Secondary | ICD-10-CM

## 2012-06-24 DIAGNOSIS — I5022 Chronic systolic (congestive) heart failure: Secondary | ICD-10-CM

## 2012-06-24 DIAGNOSIS — Z7901 Long term (current) use of anticoagulants: Secondary | ICD-10-CM

## 2012-06-24 DIAGNOSIS — I509 Heart failure, unspecified: Secondary | ICD-10-CM

## 2012-06-24 DIAGNOSIS — Z95810 Presence of automatic (implantable) cardiac defibrillator: Secondary | ICD-10-CM

## 2012-06-24 DIAGNOSIS — I253 Aneurysm of heart: Secondary | ICD-10-CM

## 2012-06-24 LAB — ICD DEVICE OBSERVATION
BRDY-0002RV: 40 {beats}/min
DEV-0020ICD: NEGATIVE
RV LEAD AMPLITUDE: 15 mv
TZAT-0001SLOWVT: 1
TZAT-0001SLOWVT: 2
TZAT-0005SLOWVT: 91 pct
TZAT-0011FASTVT: 10 ms
TZAT-0012SLOWVT: 200 ms
TZAT-0012SLOWVT: 200 ms
TZAT-0013FASTVT: 1
TZAT-0013SLOWVT: 2
TZAT-0013SLOWVT: 2
TZAT-0018FASTVT: NEGATIVE
TZAT-0018SLOWVT: NEGATIVE
TZAT-0018SLOWVT: NEGATIVE
TZAT-0019FASTVT: 8 V
TZAT-0020SLOWVT: 1.6 ms
TZAT-0020SLOWVT: 1.6 ms
TZON-0003SLOWVT: 350 ms
TZON-0004SLOWVT: 20
TZON-0005SLOWVT: 12
TZON-0008FASTVT: 0 ms
TZON-0011AFLUTTER: 70
TZST-0001FASTVT: 3
TZST-0001FASTVT: 6
TZST-0001SLOWVT: 3
TZST-0001SLOWVT: 6
TZST-0003FASTVT: 35 J
TZST-0003FASTVT: 35 J
TZST-0003SLOWVT: 25 J
TZST-0003SLOWVT: 35 J
VENTRICULAR PACING ICD: 0 pct
VF: 0

## 2012-06-24 NOTE — Patient Instructions (Signed)
Your physician recommends that you schedule a follow-up appointment in: 3 months with Device clinic  Your physician wants you to follow-up in: 1 year with Dr Ladona Ridgel. You will receive a reminder letter in the mail two months in advance. If you don't receive a letter, please call our office to schedule the follow-up appointment.

## 2012-06-24 NOTE — Progress Notes (Signed)
ELECTROPHYSIOLOGY OFFICE NOTE  Patient ID: [name] MRN: [id number], DOB/AGE: 1925/05/11   Date of Visit: 06/24/2012  Primary Physician: Pearson Grippe, MD Primary Cardiologist: Marca Ancona, MD Primary EP: Lewayne Bunting, MD Reason for Visit: EP/device follow-up  History of Present Illness  [name] is an 79 year old woman with an ICM s/p single chamber ICD implant for primary prevention SCD, chronic systolic HF and CAD who presents today for routine electrophysiology followup. Since last being seen in our clinic, she reports she is doing well. She has intermittent LE swelling which she manages with increased Lasix dose over 1-2 days as instructed by Dr. Shirlee Latch. She has no complaints. Today, she specifically denies chest pain or shortness of breath. She denies palpitations, dizziness, near syncope or syncope. She denies LE swelling, orthopnea, PND or recent weight gain. She denies ICD shocks. [name] reports she is compliant and tolerating medications without difficulty.  Past Medical History Past Medical History  Diagnosis Date  . Pure hypercholesterolemia   . Arthritis     severe lower back pain  . Other specified forms of chronic ischemic heart disease     echo (1/10) with EF 25-30%, mildly dilated LV, periapical LV aneueysm, calcified LV thombus, moderate MR. Medtronic ICD set to VVI.   Marland Kitchen CAD (coronary artery disease)     anterior MI in 2006 with Cypher DES to LAD  . HTN (hypertension)   . LV (left ventricular) mural thrombus     on coumadin   . Nephrolithiasis   . CHF (congestive heart failure)   . Cardiomyopathy     Past Surgical History Past Surgical History  Procedure Laterality Date  . Aicd implantation      ICD-Medtronic. Remote-yes   . Coronary angioplasty with stent placement    . Coronary angioplasty    . Cardiac catheterization       Allergies/Intolerances Allergies  Allergen Reactions  . Codeine     Current Home Medications Current Outpatient  Prescriptions  Medication Sig Dispense Refill  . atorvastatin (LIPITOR) 40 MG tablet Take 1 tablet (40 mg total) by mouth every evening.  30 tablet  6  . carvedilol (COREG) 25 MG tablet Take 25 mg by mouth 2 (two) times daily with a meal.      . cholecalciferol (VITAMIN D) 1000 UNITS tablet Take 1,000 Units by mouth daily.        . furosemide (LASIX) 20 MG tablet TAKE 2 TABLETS BY MOUTH EVERY MORNING AND 1 TABLET EVERY EVENING  90 tablet  5  . Glucosamine Sulfate 500 MG TABS Take by mouth daily.        Marland Kitchen losartan (COZAAR) 25 MG tablet (1/2)  50mg  tablet daily  90 tablet  3  . Multiple Vitamin (MULTIVITAMIN) tablet Take 1 tablet by mouth daily.        . nitroGLYCERIN (NITROSTAT) 0.4 MG SL tablet Place 1 tablet (0.4 mg total) under the tongue every 5 (five) minutes as needed.  30 tablet  2  . Omega-3 Fatty Acids (FISH OIL) 1000 MG CAPS Take by mouth daily.        . sertraline (ZOLOFT) 50 MG tablet Take 50 mg by mouth daily.       Marland Kitchen spironolactone (ALDACTONE) 25 MG tablet Take 1 tablet (25 mg total) by mouth daily.  30 tablet  6  . warfarin (COUMADIN) 2.5 MG tablet TAKE AS DIRECTED BY COUMADIN  CLINIC PHYSICIAN  30 tablet  2   No  current facility-administered medications for this visit.    Social History Social History  . Marital Status: Widowed   Social History Main Topics  . Smoking status: Never Smoker   . Smokeless tobacco: Never Used  . Alcohol Use: No  . Drug Use: No   Social History Narrative   Pt is widowed and lives alone near [place]. 3 daughters live nearby    Review of Systems General: No chills, fever, night sweats or weight changes Cardiovascular: No chest pain, dyspnea on exertion, edema, orthopnea, palpitations, paroxysmal nocturnal dyspnea Dermatological: No rash, lesions or masses Respiratory: No cough, dyspnea Urologic: No hematuria, dysuria Abdominal: No nausea, vomiting, diarrhea, bright red blood per rectum, melena, or hematemesis Neurologic: No visual  changes, weakness, changes in mental status All other systems reviewed and are otherwise negative except as noted above.  Physical Exam Blood pressure 142/64, pulse 65, height 4\' 11"  (1.499 m), weight 155 lb (70.308 kg), SpO2 97.00%.  General: Well developed, well appearing 79 year old female in no acute distress. HEENT: Normocephalic, atraumatic. EOMs intact. Sclera nonicteric. Oropharynx clear.  Neck: Supple. No JVD. Lungs: Respirations regular and unlabored, CTA bilaterally. No wheezes, rales or rhonchi. Heart: RRR. S1, S2 present. No murmurs, rub, S3 or S4. Abdomen: Soft, non-distended.  Extremities: No clubbing, cyanosis or edema. DP/PT/Radials 2+ and equal bilaterally. Psych: Normal affect. Neuro: Alert and oriented X 3. Moves all extremities spontaneously.   Diagnostics Device interrogation today - Normal device function. Thresholds and sensing consistent with previous device measurements. Impedance trends stable over time. No evidence of any ventricular arrhythmias. No changes made this session.  Assessment and Plan 1. ICM s/p ICD implant Normal device function No programming changes made Return to device clinic every 3 months Return for follow-up with Dr. Ladona Ridgel in one year 2. Chronic systolic HF Stable; euvolemic by exam Continue medical therapy and follow-up as scheduled with Dr. Shirlee Latch  Signed, Ranard Harte, PA-C 06/24/2012, 11:00 AM

## 2012-07-08 ENCOUNTER — Ambulatory Visit (INDEPENDENT_AMBULATORY_CARE_PROVIDER_SITE_OTHER): Payer: Medicare Other

## 2012-07-08 DIAGNOSIS — I253 Aneurysm of heart: Secondary | ICD-10-CM

## 2012-07-08 DIAGNOSIS — Z7901 Long term (current) use of anticoagulants: Secondary | ICD-10-CM

## 2012-07-08 LAB — POCT INR: INR: 1.7

## 2012-07-27 ENCOUNTER — Encounter: Payer: Self-pay | Admitting: Cardiology

## 2012-07-27 ENCOUNTER — Other Ambulatory Visit (INDEPENDENT_AMBULATORY_CARE_PROVIDER_SITE_OTHER): Payer: Medicare Other

## 2012-07-27 ENCOUNTER — Ambulatory Visit (INDEPENDENT_AMBULATORY_CARE_PROVIDER_SITE_OTHER): Payer: Medicare Other | Admitting: *Deleted

## 2012-07-27 ENCOUNTER — Ambulatory Visit (INDEPENDENT_AMBULATORY_CARE_PROVIDER_SITE_OTHER): Payer: Medicare Other | Admitting: Cardiology

## 2012-07-27 VITALS — BP 118/76 | HR 61 | Ht 59.0 in | Wt 152.0 lb

## 2012-07-27 DIAGNOSIS — R0989 Other specified symptoms and signs involving the circulatory and respiratory systems: Secondary | ICD-10-CM

## 2012-07-27 DIAGNOSIS — I509 Heart failure, unspecified: Secondary | ICD-10-CM

## 2012-07-27 DIAGNOSIS — Z7901 Long term (current) use of anticoagulants: Secondary | ICD-10-CM

## 2012-07-27 DIAGNOSIS — N189 Chronic kidney disease, unspecified: Secondary | ICD-10-CM

## 2012-07-27 DIAGNOSIS — I219 Acute myocardial infarction, unspecified: Secondary | ICD-10-CM

## 2012-07-27 DIAGNOSIS — E785 Hyperlipidemia, unspecified: Secondary | ICD-10-CM

## 2012-07-27 DIAGNOSIS — I2510 Atherosclerotic heart disease of native coronary artery without angina pectoris: Secondary | ICD-10-CM

## 2012-07-27 DIAGNOSIS — I2589 Other forms of chronic ischemic heart disease: Secondary | ICD-10-CM

## 2012-07-27 DIAGNOSIS — I5022 Chronic systolic (congestive) heart failure: Secondary | ICD-10-CM

## 2012-07-27 DIAGNOSIS — I253 Aneurysm of heart: Secondary | ICD-10-CM

## 2012-07-27 DIAGNOSIS — I513 Intracardiac thrombosis, not elsewhere classified: Secondary | ICD-10-CM

## 2012-07-27 LAB — BASIC METABOLIC PANEL
Calcium: 9.5 mg/dL (ref 8.4–10.5)
GFR: 35.33 mL/min — ABNORMAL LOW (ref >60.00)
Glucose, Bld: 100 mg/dL — ABNORMAL HIGH (ref 70–99)
Potassium: 4 mEq/L (ref 3.5–5.1)
Sodium: 139 mEq/L (ref 135–145)

## 2012-07-27 LAB — POCT INR: INR: 2.6

## 2012-07-27 LAB — BRAIN NATRIURETIC PEPTIDE: Pro B Natriuretic peptide (BNP): 214 pg/mL — ABNORMAL HIGH (ref 0.0–100.0)

## 2012-07-27 NOTE — Progress Notes (Signed)
Patient ID: [name], female   DOB: 1925-07-27, 79 y.o.   MRN: [id number] PCP: Dr. Selena Batten  79 yo with history of CAD, ischemic CMP and apical aneurysm with LV thrombus presents for cardiology followup. Last echo in 7/13 showed EF 25-30% with apical aneurysm.  Lexiscan myoview in 7/13 showed a large fixed inferolateral defect and a large predominantly fixed apical defect.  There was no significant ischemia and she was managed medically.   Today, she feels like she is at her baseline. She is short of breath after walking 100-150 feet.  No exertional chest pain.  Rare atypical chest pain, sometimes after taking her pills.  No bendopnea, orthopnea or PND.  Weight is down 3 lbs.  She continues to live alone.  She is now only wearing oxygen at night.    Labs (12/11): K 4.1, creatinine 1.1 Labs (4/12): K 4.6, creatinine 1.2, BUN 43, BNP 271 Labs (6/12): K 4, creatinine 1.3, LDL 66, HDL 44 Labs (10/12): K 4.8, creatinine 1.3 Labs (3/13): K 4.7, creatinine 1.4 Labs (4/13): K 4.6, creatinine 1.22, LDL 71, HDL 45 Labs (7/13): K 4.2, creatinine 1.2 Labs (10/13): K 3.8, creatinine 1.1, BNP 171 Labs (11/13): K 4, creatinine 1.2, LDL 80, HDL 46 Labs (1/14): K 3.9, creatinine 1.2 Labs (2/14): LDL 72, HDL 50  Allergies (verified):  1)  ! Codeine 2)  ! Williams Che  Past Medical History: 1. HYPERCHOLESTEROLEMIA (ICD-272.0) 2. ARTHRITIS (ICD-716.90): Severe low back pain.  3. ISCHEMIC CARDIOMYOPATHY (ICD-414.8): Echo (1/10) with EF 25-30%, mildly dilated LV, periapical LV aneurysm, calcified LV thrombus, moderate MR.  Medtronic ICD set to VVI.  Echo (7/12) with EF 25-30%, mid-apical anteroseptal, apical lateral, apical anterior akinesis and aneurysm of the true apex; no thrombus noted, moderate MR, PA systolic pressure 30 mmHg.  4. CAD (ICD-414.00): Anterior MI in 2006 with Cypher DES to LAD.  Lexiscan myoview (7/13) with large fixed inferolateral defect and large predominantly fixed apical defect.  There was  minimal ischemia so she was managed medically.  5. HYPERTENSION (ICD-401.9) 6. LV thrombus on coumadin.  7. Macular degeneration 8. CKD  Social History: The patient lives alone near [place]. 3 daughters live nearby. Widowed Tobacco Use - No.  Alcohol Use - no  Family History:  CAD   Current Outpatient Prescriptions  Medication Sig Dispense Refill  . atorvastatin (LIPITOR) 40 MG tablet Take 1 tablet (40 mg total) by mouth every evening.  30 tablet  6  . carvedilol (COREG) 25 MG tablet Take 25 mg by mouth 2 (two) times daily with a meal.      . cholecalciferol (VITAMIN D) 1000 UNITS tablet Take 1,000 Units by mouth daily.        . furosemide (LASIX) 20 MG tablet TAKE 2 TABLETS BY MOUTH EVERY MORNING AND 1 TABLET EVERY EVENING  90 tablet  5  . Glucosamine Sulfate 500 MG TABS Take by mouth daily.        Marland Kitchen losartan (COZAAR) 25 MG tablet (1/2)  50mg  tablet daily  90 tablet  3  . Multiple Vitamin (MULTIVITAMIN) tablet Take 1 tablet by mouth daily.        . nitroGLYCERIN (NITROSTAT) 0.4 MG SL tablet Place 1 tablet (0.4 mg total) under the tongue every 5 (five) minutes as needed.  30 tablet  2  . Omega-3 Fatty Acids (FISH OIL) 1000 MG CAPS Take by mouth daily.        . sertraline (ZOLOFT) 50 MG tablet Take 50 mg  by mouth daily.       Marland Kitchen spironolactone (ALDACTONE) 25 MG tablet Take 1 tablet (25 mg total) by mouth daily.  30 tablet  6  . warfarin (COUMADIN) 2.5 MG tablet TAKE AS DIRECTED BY COUMADIN  CLINIC PHYSICIAN  30 tablet  2   No current facility-administered medications for this visit.    BP 118/76  Pulse 61  Ht 4\' 11"  (1.499 m)  Wt 152 lb (68.947 kg)  BMI 30.68 kg/m2  SpO2 96% General:  Elderly woman in no distress.  Neck:  Neck supple, JVP not elevated. No masses, thyromegaly or abnormal cervical nodes. Lungs:  Clear bilaterally. Heart:  Non-displaced PMI, chest non-tender; regular rate and rhythm, S1, S2 without rubs or gallops. 2/6 HSM at apex.  Carotid upstroke normal, no  bruit.  Pedals normal pulses. No peripheral edema. Abdomen:  Bowel sounds positive; abdomen soft and non-tender without masses, organomegaly, or hernias noted. No hepatosplenomegaly. Extremities:  No clubbing or cyanosis. Neurologic:  Alert and oriented x 3. Psych:  Normal affect.  Assessment/Plan:  1. CAD   Stable with occasional atypical chest pain.  Myoview in 7/13 showed scar without significant ischemia.  Continue statin, ARB, beta blocker. She is on warfarin so she does not need to take ASA 81.  2. HYPERLIPIDEMIA Good lipids in 2/14, continue statin.  3. Systolic CHF, chronic   NYHA class III symptoms, at baseline. Looks euvolemic. - Continue Lasix 40 qam, 20 qpm.   - Continue current spironolactone, losartan and Coreg.  Creatinine has risen with prior attempts to uptitrate losartan.  - Check BMET/BNP today.  4. VENTRICULAR ANEURYSM, LEFT  Apical aneurysm with thrombus, not seen on last echo. Continue warfarin. No falls but some gait instability. She is using her cane.   Marca Ancona 07/27/2012

## 2012-07-27 NOTE — Patient Instructions (Addendum)
Your physician recommends that you have lab work today--BMET/BNP.   Your physician recommends that you schedule a follow-up appointment in: 4 months with Dr McLean.    

## 2012-08-07 ENCOUNTER — Other Ambulatory Visit: Payer: Self-pay | Admitting: Internal Medicine

## 2012-08-19 ENCOUNTER — Ambulatory Visit (INDEPENDENT_AMBULATORY_CARE_PROVIDER_SITE_OTHER): Payer: Medicare Other

## 2012-08-19 DIAGNOSIS — I253 Aneurysm of heart: Secondary | ICD-10-CM

## 2012-08-19 DIAGNOSIS — Z7901 Long term (current) use of anticoagulants: Secondary | ICD-10-CM

## 2012-08-25 ENCOUNTER — Other Ambulatory Visit: Payer: Self-pay | Admitting: Cardiology

## 2012-09-02 ENCOUNTER — Ambulatory Visit (INDEPENDENT_AMBULATORY_CARE_PROVIDER_SITE_OTHER): Payer: Medicare Other | Admitting: *Deleted

## 2012-09-02 DIAGNOSIS — Z7901 Long term (current) use of anticoagulants: Secondary | ICD-10-CM

## 2012-09-02 DIAGNOSIS — I253 Aneurysm of heart: Secondary | ICD-10-CM

## 2012-09-02 LAB — POCT INR: INR: 2.6

## 2012-09-15 ENCOUNTER — Telehealth: Payer: Self-pay | Admitting: Pharmacist

## 2012-09-15 ENCOUNTER — Other Ambulatory Visit: Payer: Self-pay | Admitting: Cardiology

## 2012-09-15 NOTE — Telephone Encounter (Signed)
Called and left message with patient to call us due to cipro antibiotic can make pts INR thin, will need to move coumadin clinic appt.

## 2012-09-15 NOTE — Telephone Encounter (Signed)
New problem   pts pcp wanted pt to inform coumadin clinic that she had a uti and she's taking ciprofloxacin

## 2012-09-17 ENCOUNTER — Ambulatory Visit (INDEPENDENT_AMBULATORY_CARE_PROVIDER_SITE_OTHER): Payer: Medicare Other

## 2012-09-17 DIAGNOSIS — I253 Aneurysm of heart: Secondary | ICD-10-CM

## 2012-09-17 DIAGNOSIS — Z7901 Long term (current) use of anticoagulants: Secondary | ICD-10-CM

## 2012-09-17 LAB — POCT INR: INR: 3.9

## 2012-09-29 ENCOUNTER — Ambulatory Visit (INDEPENDENT_AMBULATORY_CARE_PROVIDER_SITE_OTHER): Payer: Medicare Other | Admitting: *Deleted

## 2012-09-29 ENCOUNTER — Encounter: Payer: Self-pay | Admitting: Internal Medicine

## 2012-09-29 DIAGNOSIS — I253 Aneurysm of heart: Secondary | ICD-10-CM

## 2012-09-29 DIAGNOSIS — I2589 Other forms of chronic ischemic heart disease: Secondary | ICD-10-CM

## 2012-09-29 DIAGNOSIS — Z7901 Long term (current) use of anticoagulants: Secondary | ICD-10-CM

## 2012-09-29 LAB — ICD DEVICE OBSERVATION
BATTERY VOLTAGE: 2.66 V
BRDY-0002RV: 40 {beats}/min
CHARGE TIME: 9.56 s
FVT: 0
TZAT-0001FASTVT: 1
TZAT-0004FASTVT: 8
TZAT-0004SLOWVT: 8
TZAT-0004SLOWVT: 8
TZAT-0005FASTVT: 88 pct
TZAT-0011FASTVT: 10 ms
TZAT-0012SLOWVT: 200 ms
TZAT-0012SLOWVT: 200 ms
TZAT-0020SLOWVT: 1.6 ms
TZAT-0020SLOWVT: 1.6 ms
TZON-0003SLOWVT: 350 ms
TZON-0011AFLUTTER: 70
TZST-0001FASTVT: 2
TZST-0001FASTVT: 5
TZST-0001SLOWVT: 3
TZST-0001SLOWVT: 5
TZST-0003FASTVT: 35 J
TZST-0003FASTVT: 35 J
TZST-0003SLOWVT: 15 J
TZST-0003SLOWVT: 35 J
VENTRICULAR PACING ICD: 0 pct
VF: 0

## 2012-09-29 LAB — POCT INR: INR: 2.8

## 2012-09-29 NOTE — Progress Notes (Signed)
ICD check in office. 

## 2012-10-18 ENCOUNTER — Ambulatory Visit (INDEPENDENT_AMBULATORY_CARE_PROVIDER_SITE_OTHER): Payer: Medicare Other | Admitting: Pharmacist

## 2012-10-18 DIAGNOSIS — Z7901 Long term (current) use of anticoagulants: Secondary | ICD-10-CM

## 2012-10-18 DIAGNOSIS — I253 Aneurysm of heart: Secondary | ICD-10-CM

## 2012-11-15 ENCOUNTER — Ambulatory Visit (INDEPENDENT_AMBULATORY_CARE_PROVIDER_SITE_OTHER): Payer: Medicare Other | Admitting: *Deleted

## 2012-11-15 DIAGNOSIS — I253 Aneurysm of heart: Secondary | ICD-10-CM

## 2012-11-15 DIAGNOSIS — Z7901 Long term (current) use of anticoagulants: Secondary | ICD-10-CM

## 2012-11-16 ENCOUNTER — Other Ambulatory Visit: Payer: Self-pay | Admitting: Cardiology

## 2012-11-24 ENCOUNTER — Other Ambulatory Visit: Payer: Self-pay | Admitting: Cardiology

## 2012-12-03 ENCOUNTER — Encounter: Payer: Self-pay | Admitting: Cardiology

## 2012-12-03 ENCOUNTER — Ambulatory Visit (INDEPENDENT_AMBULATORY_CARE_PROVIDER_SITE_OTHER): Payer: Medicare Other | Admitting: Cardiology

## 2012-12-03 ENCOUNTER — Ambulatory Visit (INDEPENDENT_AMBULATORY_CARE_PROVIDER_SITE_OTHER): Payer: Medicare Other

## 2012-12-03 VITALS — BP 115/70 | HR 62 | Wt 153.0 lb

## 2012-12-03 DIAGNOSIS — I2510 Atherosclerotic heart disease of native coronary artery without angina pectoris: Secondary | ICD-10-CM

## 2012-12-03 DIAGNOSIS — E785 Hyperlipidemia, unspecified: Secondary | ICD-10-CM

## 2012-12-03 DIAGNOSIS — I253 Aneurysm of heart: Secondary | ICD-10-CM

## 2012-12-03 DIAGNOSIS — I5022 Chronic systolic (congestive) heart failure: Secondary | ICD-10-CM

## 2012-12-03 DIAGNOSIS — R002 Palpitations: Secondary | ICD-10-CM

## 2012-12-03 DIAGNOSIS — N189 Chronic kidney disease, unspecified: Secondary | ICD-10-CM

## 2012-12-03 DIAGNOSIS — I509 Heart failure, unspecified: Secondary | ICD-10-CM

## 2012-12-03 DIAGNOSIS — I2589 Other forms of chronic ischemic heart disease: Secondary | ICD-10-CM

## 2012-12-03 DIAGNOSIS — Z7901 Long term (current) use of anticoagulants: Secondary | ICD-10-CM

## 2012-12-03 LAB — POCT INR: INR: 2.6

## 2012-12-03 NOTE — Patient Instructions (Addendum)
Increase lasix (furosemide) to 40mg  two times a day. This will be 2 of your 20mg  tablets two times a day.   Your physician has recommended that you wear a holter monitor. Holter monitors are medical devices that record the heart's electrical activity. Doctors most often use these monitors to diagnose arrhythmias. Arrhythmias are problems with the speed or rhythm of the heartbeat. The monitor is a small, portable device. You can wear one while you do your normal daily activities. This is usually used to diagnose what is causing palpitations/syncope (passing out). 24 hour     Your physician recommends that you return for a FASTING lipid profile /BMET/BNP in about 10 days.    Your physician recommends that you schedule a follow-up appointment in: 4-5 weeks with Dr Shirlee Latch.

## 2012-12-04 NOTE — Progress Notes (Signed)
Patient ID: [name], female   DOB: [birth year], 79 y.o.   MRN: [id number] PCP: Dr. Selena Batten  79 yo with history of CAD, ischemic CMP and apical aneurysm with LV thrombus presents for cardiology followup. Last echo in 7/13 showed EF 25-30% with apical aneurysm.  Lexiscan myoview in 7/13 showed a large fixed inferolateral defect and a large predominantly fixed apical defect.  There was no significant ischemia and she was managed medically.   Weight is up 1 lb.  She says that she has been more short of breath. She is using oxygen at night.  She has been sleeping in the recliner (has done this for a while now).  She is a little dyspneic walking in her house.  No chest pain.  No rest symptoms.  She has been feeling "funny" heart beats.  She does not feel her heart racing.  She is "weak" in general.  Hip and back arthritis has also been limiting.  She is in NSR today.   ECG: NSR, LAFB, old ASMI, lateral TWIs  Labs (12/11): K 4.1, creatinine 1.1 Labs (4/12): K 4.6, creatinine 1.2, BUN 43, BNP 271 Labs (6/12): K 4, creatinine 1.3, LDL 66, HDL 44 Labs (10/12): K 4.8, creatinine 1.3 Labs (3/13): K 4.7, creatinine 1.4 Labs (4/13): K 4.6, creatinine 1.22, LDL 71, HDL 45 Labs (7/13): K 4.2, creatinine 1.2 Labs (10/13): K 3.8, creatinine 1.1, BNP 171 Labs (11/13): K 4, creatinine 1.2, LDL 80, HDL 46 Labs (1/14): K 3.9, creatinine 1.2 Labs (2/14): LDL 72, HDL 50 Labs (5/14): K 4, creatinine 1.5, BNP 214  Allergies (verified):  1)  ! Codeine 2)  ! Williams Che  Past Medical History: 1. HYPERCHOLESTEROLEMIA (ICD-272.0) 2. ARTHRITIS (ICD-716.90): Severe low back pain.  3. ISCHEMIC CARDIOMYOPATHY (ICD-414.8): Echo (1/10) with EF 25-30%, mildly dilated LV, periapical LV aneurysm, calcified LV thrombus, moderate MR.  Medtronic ICD set to VVI.  Echo (7/12) with EF 25-30%, mid-apical anteroseptal, apical lateral, apical anterior akinesis and aneurysm of the true apex; no thrombus noted, moderate MR, PA systolic  pressure 30 mmHg.  4. CAD (ICD-414.00): Anterior MI in 2006 with Cypher DES to LAD.  Lexiscan myoview (7/13) with large fixed inferolateral defect and large predominantly fixed apical defect.  There was minimal ischemia so she was managed medically.  5. HYPERTENSION (ICD-401.9) 6. LV thrombus on coumadin.  7. Macular degeneration 8. CKD  Social History: The patient lives alone near [place]. 3 daughters live nearby. Widowed Tobacco Use - No.  Alcohol Use - no  Family History:  CAD  ROS:  All systems reviewed and negative except as per HPI.    Current Outpatient Prescriptions  Medication Sig Dispense Refill  . atorvastatin (LIPITOR) 40 MG tablet TAKE 1 TABLET (40 MG TOTAL) BY MOUTH EVERY EVENING.  30 tablet  5  . carvedilol (COREG) 25 MG tablet Take 25 mg by mouth 2 (two) times daily with a meal.      . cholecalciferol (VITAMIN D) 1000 UNITS tablet Take 1,000 Units by mouth daily.        . Glucosamine Sulfate 500 MG TABS Take by mouth daily.        Marland Kitchen losartan (COZAAR) 25 MG tablet (1/2)  50mg  tablet daily  90 tablet  3  . Multiple Vitamin (MULTIVITAMIN) tablet Take 1 tablet by mouth daily.        . nitroGLYCERIN (NITROSTAT) 0.4 MG SL tablet Place 1 tablet (0.4 mg total) under the tongue every 5 (five) minutes as  needed.  30 tablet  2  . Omega-3 Fatty Acids (FISH OIL) 1000 MG CAPS Take by mouth daily.        . sertraline (ZOLOFT) 50 MG tablet Take 50 mg by mouth daily.       Marland Kitchen spironolactone (ALDACTONE) 25 MG tablet TAKE ONE TABLET BY MOUTH ONE TIME DAILY  30 tablet  5  . warfarin (COUMADIN) 2.5 MG tablet TAKE 1 TABLET AS INSTRUCTED BY COUMADIN CLINIC  30 tablet  3  . furosemide (LASIX) 40 MG tablet 2 of a 20mg  tablet two times a day       No current facility-administered medications for this visit.    BP 115/70  Pulse 62  Wt 69.4 kg (153 lb)  BMI 30.89 kg/m2 General:  Elderly woman in no distress.  Neck:  Neck supple, JVP 8-9 cm. No masses, thyromegaly or abnormal cervical  nodes. Lungs:  Clear bilaterally. Heart:  Non-displaced PMI, chest non-tender; regular rate and rhythm, S1, S2 without rubs or gallops. 2/6 HSM at apex.  Carotid upstroke normal, no bruit.  Pedals normal pulses. No peripheral edema. Abdomen:  Bowel sounds positive; abdomen soft and non-tender without masses, organomegaly, or hernias noted. No hepatosplenomegaly. Extremities:  No clubbing or cyanosis. Neurologic:  Alert and oriented x 3. Psych:  Normal affect.  Assessment/Plan:  1. CAD   Stable with occasional atypical chest pain.  Myoview in 7/13 showed scar without significant ischemia.  Continue statin, ARB, beta blocker. She is on warfarin so she does not need to take ASA 81.  2. HYPERLIPIDEMIA Continue statin, will check lipids. 3. Systolic CHF, chronic   NYHA class III symptoms.  Somewhat worse with some signs of volume overload. - Increase Lasix to 40 mg bid (gently with CKD).  - Continue current spironolactone, losartan and Coreg.  Creatinine has risen with prior attempts to uptitrate losartan.  - Check BMET/BNP in 1 week. 4. VENTRICULAR ANEURYSM, LEFT  Apical aneurysm with thrombus, not seen on last echo. Continue warfarin. No falls but some gait instability. She is using her cane.  5. Palpitations Patient has been feeling "funny" heart beats.  She is in NSR today.  I will have her wear a holter monitor to look for atrial fibrillation paroxysms that could potentially be making her feel worse.   Marca Ancona 12/04/2012

## 2012-12-08 ENCOUNTER — Other Ambulatory Visit: Payer: Self-pay | Admitting: Internal Medicine

## 2012-12-13 ENCOUNTER — Other Ambulatory Visit (INDEPENDENT_AMBULATORY_CARE_PROVIDER_SITE_OTHER): Payer: Medicare Other

## 2012-12-13 ENCOUNTER — Encounter: Payer: Self-pay | Admitting: *Deleted

## 2012-12-13 ENCOUNTER — Encounter (INDEPENDENT_AMBULATORY_CARE_PROVIDER_SITE_OTHER): Payer: Medicare Other

## 2012-12-13 DIAGNOSIS — E785 Hyperlipidemia, unspecified: Secondary | ICD-10-CM

## 2012-12-13 DIAGNOSIS — R002 Palpitations: Secondary | ICD-10-CM

## 2012-12-13 DIAGNOSIS — I509 Heart failure, unspecified: Secondary | ICD-10-CM

## 2012-12-13 DIAGNOSIS — I5022 Chronic systolic (congestive) heart failure: Secondary | ICD-10-CM

## 2012-12-13 DIAGNOSIS — I2589 Other forms of chronic ischemic heart disease: Secondary | ICD-10-CM

## 2012-12-13 LAB — LIPID PANEL
HDL: 44.4 mg/dL (ref >39.00)
LDL Cholesterol: 82 mg/dL (ref 0–99)
Total CHOL/HDL Ratio: 4
VLDL: 29.6 mg/dL (ref 0.0–40.0)

## 2012-12-13 LAB — BASIC METABOLIC PANEL
BUN: 48 mg/dL — ABNORMAL HIGH (ref 6–23)
CO2: 25 mEq/L (ref 19–32)
Calcium: 9.5 mg/dL (ref 8.4–10.5)
Chloride: 105 mEq/L (ref 96–112)
Creatinine, Ser: 1.4 mg/dL — ABNORMAL HIGH (ref 0.4–1.2)
Glucose, Bld: 121 mg/dL — ABNORMAL HIGH (ref 70–99)
Potassium: 4.1 mEq/L (ref 3.5–5.1)

## 2012-12-13 NOTE — Progress Notes (Signed)
Patient ID: [name], female   DOB: Jan 27, 1926, 79 y.o.   MRN: [id number] E-cardio 24 hour holter monitor applied to patient.

## 2012-12-23 ENCOUNTER — Telehealth: Payer: Self-pay | Admitting: *Deleted

## 2012-12-23 NOTE — Telephone Encounter (Signed)
Dr Shirlee Latch reviewed monitor done 12/13/12 occasional PACs and PVCs. No significant abnormalities. Pt notified.

## 2012-12-28 ENCOUNTER — Encounter: Payer: Self-pay | Admitting: Cardiology

## 2013-01-03 ENCOUNTER — Ambulatory Visit (INDEPENDENT_AMBULATORY_CARE_PROVIDER_SITE_OTHER): Payer: Medicare Other | Admitting: Cardiology

## 2013-01-03 ENCOUNTER — Ambulatory Visit (INDEPENDENT_AMBULATORY_CARE_PROVIDER_SITE_OTHER): Payer: Medicare Other | Admitting: *Deleted

## 2013-01-03 ENCOUNTER — Encounter: Payer: Self-pay | Admitting: Cardiology

## 2013-01-03 VITALS — BP 122/64 | HR 62 | Ht 59.0 in | Wt 146.0 lb

## 2013-01-03 DIAGNOSIS — Z7901 Long term (current) use of anticoagulants: Secondary | ICD-10-CM

## 2013-01-03 DIAGNOSIS — R0609 Other forms of dyspnea: Secondary | ICD-10-CM

## 2013-01-03 DIAGNOSIS — R0989 Other specified symptoms and signs involving the circulatory and respiratory systems: Secondary | ICD-10-CM

## 2013-01-03 DIAGNOSIS — N189 Chronic kidney disease, unspecified: Secondary | ICD-10-CM

## 2013-01-03 DIAGNOSIS — I2589 Other forms of chronic ischemic heart disease: Secondary | ICD-10-CM

## 2013-01-03 DIAGNOSIS — I2510 Atherosclerotic heart disease of native coronary artery without angina pectoris: Secondary | ICD-10-CM

## 2013-01-03 DIAGNOSIS — I253 Aneurysm of heart: Secondary | ICD-10-CM

## 2013-01-03 DIAGNOSIS — I5022 Chronic systolic (congestive) heart failure: Secondary | ICD-10-CM

## 2013-01-03 DIAGNOSIS — I509 Heart failure, unspecified: Secondary | ICD-10-CM

## 2013-01-03 DIAGNOSIS — E785 Hyperlipidemia, unspecified: Secondary | ICD-10-CM

## 2013-01-03 LAB — ICD DEVICE OBSERVATION
BATTERY VOLTAGE: 2.65 V
BRDY-0002RV: 40 {beats}/min
DEV-0020ICD: NEGATIVE
FVT: 0
PACEART VT: 0
RV LEAD IMPEDENCE ICD: 340 Ohm
TZAT-0001FASTVT: 1
TZAT-0001SLOWVT: 1
TZAT-0001SLOWVT: 2
TZAT-0004SLOWVT: 8
TZAT-0005SLOWVT: 84 pct
TZAT-0005SLOWVT: 91 pct
TZAT-0011FASTVT: 10 ms
TZAT-0011SLOWVT: 10 ms
TZAT-0012FASTVT: 200 ms
TZAT-0012SLOWVT: 200 ms
TZAT-0012SLOWVT: 200 ms
TZAT-0013FASTVT: 1
TZAT-0013SLOWVT: 2
TZAT-0013SLOWVT: 2
TZAT-0018FASTVT: NEGATIVE
TZAT-0018SLOWVT: NEGATIVE
TZAT-0018SLOWVT: NEGATIVE
TZAT-0019FASTVT: 8 V
TZAT-0020SLOWVT: 1.6 ms
TZON-0003SLOWVT: 350 ms
TZON-0004SLOWVT: 20
TZON-0005SLOWVT: 12
TZON-0008FASTVT: 0 ms
TZON-0011AFLUTTER: 70
TZST-0001FASTVT: 3
TZST-0001FASTVT: 5
TZST-0001FASTVT: 6
TZST-0001SLOWVT: 3
TZST-0001SLOWVT: 6
TZST-0003FASTVT: 35 J
TZST-0003FASTVT: 35 J
TZST-0003FASTVT: 35 J
TZST-0003SLOWVT: 25 J
TZST-0003SLOWVT: 35 J
VF: 0

## 2013-01-03 LAB — BASIC METABOLIC PANEL
CO2: 26 mEq/L (ref 19–32)
Chloride: 101 mEq/L (ref 96–112)
Creatinine, Ser: 1.5 mg/dL — ABNORMAL HIGH (ref 0.4–1.2)
Potassium: 3.7 mEq/L (ref 3.5–5.1)
Sodium: 140 mEq/L (ref 135–145)

## 2013-01-03 LAB — BRAIN NATRIURETIC PEPTIDE: Pro B Natriuretic peptide (BNP): 234 pg/mL — ABNORMAL HIGH (ref 0.0–100.0)

## 2013-01-03 LAB — POCT INR: INR: 2.4

## 2013-01-03 NOTE — Progress Notes (Signed)
Patient ID: [name], female   DOB: [birth year], 79 y.o.   MRN: [id number] PCP: Dr. Selena Batten  79 yo with history of CAD, ischemic CMP and apical aneurysm with LV thrombus presents for cardiology followup. Last echo in 7/13 showed EF 25-30% with apical aneurysm.  Lexiscan myoview in 7/13 showed a large fixed inferolateral defect and a large predominantly fixed apical defect.  There was no significant ischemia and she was managed medically.  At last appointment, she was volume overloaded and symptomatic so I increased her Lasix to 40 mg bid.  She had palpitations and holter monitor showed only PACs and PVCs, no atrial fibrillation.   She is doing well overall since last appointment.  Weight is down some on her home scale.  She can walk around her house without dyspnea now.  She uses her cane everywhere.  She is scared of falling.  She is short of breath after walking about 100 feet (stable to improved).    Labs (12/11): K 4.1, creatinine 1.1 Labs (4/12): K 4.6, creatinine 1.2, BUN 43, BNP 271 Labs (6/12): K 4, creatinine 1.3, LDL 66, HDL 44 Labs (10/12): K 4.8, creatinine 1.3 Labs (3/13): K 4.7, creatinine 1.4 Labs (4/13): K 4.6, creatinine 1.22, LDL 71, HDL 45 Labs (7/13): K 4.2, creatinine 1.2 Labs (10/13): K 3.8, creatinine 1.1, BNP 171 Labs (11/13): K 4, creatinine 1.2, LDL 80, HDL 46 Labs (1/14): K 3.9, creatinine 1.2 Labs (2/14): LDL 72, HDL 50 Labs (5/14): K 4, creatinine 1.5, BNP 214 Labs (9/14): K 4.4, creatinine 1.4, BNP 220, LDL 82, HDL 44  Allergies (verified):  1)  ! Codeine 2)  ! Williams Che  Past Medical History: 1. HYPERCHOLESTEROLEMIA (ICD-272.0) 2. ARTHRITIS (ICD-716.90): Severe low back pain.  3. ISCHEMIC CARDIOMYOPATHY (ICD-414.8): Echo (1/10) with EF 25-30%, mildly dilated LV, periapical LV aneurysm, calcified LV thrombus, moderate MR.  Medtronic ICD set to VVI.  Echo (7/12) with EF 25-30%, mid-apical anteroseptal, apical lateral, apical anterior akinesis and aneurysm of  the true apex; no thrombus noted, moderate MR, PA systolic pressure 30 mmHg.  4. CAD (ICD-414.00): Anterior MI in 2006 with Cypher DES to LAD.  Lexiscan myoview (7/13) with large fixed inferolateral defect and large predominantly fixed apical defect.  There was minimal ischemia so she was managed medically.  5. HYPERTENSION (ICD-401.9) 6. LV thrombus on coumadin.  7. Macular degeneration 8. CKD 9. Palpitations: Holter (9/14) with PACs and PVCs, no atrial fibrillation.   Social History: The patient lives alone near [place]. 3 daughters live nearby. Widowed Tobacco Use - No.  Alcohol Use - no  Family History:  CAD  ROS:  All systems reviewed and negative except as per HPI.    Current Outpatient Prescriptions  Medication Sig Dispense Refill  . atorvastatin (LIPITOR) 40 MG tablet TAKE 1 TABLET (40 MG TOTAL) BY MOUTH EVERY EVENING.  30 tablet  5  . carvedilol (COREG) 25 MG tablet Take 25 mg by mouth 2 (two) times daily with a meal.      . cholecalciferol (VITAMIN D) 1000 UNITS tablet Take 1,000 Units by mouth daily.        . furosemide (LASIX) 40 MG tablet 2 of a 20mg  tablet two times a day      . Glucosamine Sulfate 500 MG TABS Take by mouth daily.        Marland Kitchen losartan (COZAAR) 25 MG tablet (1/2)  50mg  tablet daily  90 tablet  3  . Multiple Vitamin (MULTIVITAMIN) tablet Take 1  tablet by mouth daily.        . nitroGLYCERIN (NITROSTAT) 0.4 MG SL tablet Place 1 tablet (0.4 mg total) under the tongue every 5 (five) minutes as needed.  30 tablet  2  . Omega-3 Fatty Acids (FISH OIL) 1000 MG CAPS Take by mouth daily.        . sertraline (ZOLOFT) 50 MG tablet Take 50 mg by mouth daily.       Marland Kitchen spironolactone (ALDACTONE) 25 MG tablet TAKE ONE TABLET BY MOUTH ONE TIME DAILY  30 tablet  5  . warfarin (COUMADIN) 2.5 MG tablet Take as directed by anticoagulation clinic  30 tablet  3   No current facility-administered medications for this visit.    BP 122/64  Pulse 62  Ht 4\' 11"  (1.499 m)  Wt  146 lb (66.225 kg)  BMI 29.47 kg/m2  SpO2 95% General:  Elderly woman in no distress.  Neck:  Neck supple, JVP 7 cm. No masses, thyromegaly or abnormal cervical nodes. Lungs:  Clear bilaterally. Heart:  Non-displaced PMI, chest non-tender; regular rate and rhythm, S1, S2 without rubs or gallops. 2/6 HSM at apex.  Carotid upstroke normal, no bruit.  Pedals normal pulses. No peripheral edema. Abdomen:  Bowel sounds positive; abdomen soft and non-tender without masses, organomegaly, or hernias noted. No hepatosplenomegaly. Extremities:  No clubbing or cyanosis. Neurologic:  Alert and oriented x 3. Psych:  Normal affect.  Assessment/Plan:  1. CAD   Stable with occasional atypical chest pain.  Myoview in 7/13 showed scar without significant ischemia.  Continue statin, ARB, beta blocker. She is on warfarin so she does not need to take ASA 81.  2. HYPERLIPIDEMIA Continue statin, good lipids in 9/14. 3. Systolic CHF, chronic   NYHA class III symptoms.  Improved since last appointment.   - Volume ok, continue current Lasix. - Continue current spironolactone, losartan and Coreg.  Creatinine has risen with prior attempts to uptitrate losartan.  - Check BMET/BNP today. 4. VENTRICULAR ANEURYSM, LEFT  Apical aneurysm with thrombus, not seen on last echo. Continue warfarin. No falls but some gait instability. She is using her cane.  5. Palpitations No atrial fibrillation seen on holter monitor.  Not as prominent at this time.   Marca Ancona 01/03/2013

## 2013-01-03 NOTE — Progress Notes (Signed)
ICD check in clinic. Normal device function. Thresholds and sensing consistent with previous device measurements. Impedance trends stable over time. No evidence of any ventricular arrhythmias. Histogram distribution appropriate for patient and level of activity. No changes made this session. Device programmed at appropriate safety margins. Device programmed to optimize intrinsic conduction. Patient education completed including shock plan. Alert tones/vibration demonstrated for patient.  Patient with [phone] lead on manufacturer recall. LIA installed on device. Impedance trends stable, SIC counter 0. NSVT episodes reviewed.  No noise detected, no increase in NSVT episodes.  Patient education completed about alert tones and magnet use. Alert tones demonstrated for patient. Impedance alerts programmed per manufacturer recommendations.  ROV in January with the device clinic.

## 2013-01-03 NOTE — Patient Instructions (Signed)
Your physician recommends that you have lab work today--BMET/BNP.   Your physician recommends that you schedule a follow-up appointment in: 3 months with Dr Shirlee Latch in the [facility].    Your physician recommends that you return for lab work in: 3 months when you see Dr McLean--BMET/BNP.

## 2013-01-06 ENCOUNTER — Encounter: Payer: Self-pay | Admitting: Internal Medicine

## 2013-02-07 ENCOUNTER — Ambulatory Visit (INDEPENDENT_AMBULATORY_CARE_PROVIDER_SITE_OTHER): Payer: Medicare Other | Admitting: Pharmacist

## 2013-02-07 DIAGNOSIS — I253 Aneurysm of heart: Secondary | ICD-10-CM

## 2013-02-07 DIAGNOSIS — Z7901 Long term (current) use of anticoagulants: Secondary | ICD-10-CM

## 2013-02-07 LAB — POCT INR: INR: 3.3

## 2013-02-21 ENCOUNTER — Other Ambulatory Visit: Payer: Self-pay | Admitting: Cardiology

## 2013-02-28 ENCOUNTER — Ambulatory Visit (INDEPENDENT_AMBULATORY_CARE_PROVIDER_SITE_OTHER): Payer: Medicare Other | Admitting: *Deleted

## 2013-02-28 DIAGNOSIS — Z7901 Long term (current) use of anticoagulants: Secondary | ICD-10-CM

## 2013-02-28 DIAGNOSIS — I253 Aneurysm of heart: Secondary | ICD-10-CM

## 2013-02-28 LAB — POCT INR: INR: 3.3

## 2013-03-14 ENCOUNTER — Ambulatory Visit (INDEPENDENT_AMBULATORY_CARE_PROVIDER_SITE_OTHER): Payer: Medicare Other | Admitting: Pharmacist

## 2013-03-14 DIAGNOSIS — Z7901 Long term (current) use of anticoagulants: Secondary | ICD-10-CM

## 2013-03-14 DIAGNOSIS — I253 Aneurysm of heart: Secondary | ICD-10-CM

## 2013-03-14 LAB — POCT INR: INR: 2.8

## 2013-03-23 ENCOUNTER — Other Ambulatory Visit: Payer: Self-pay | Admitting: Cardiology

## 2013-03-25 ENCOUNTER — Ambulatory Visit (INDEPENDENT_AMBULATORY_CARE_PROVIDER_SITE_OTHER): Payer: Medicare Other | Admitting: Pharmacist

## 2013-03-25 DIAGNOSIS — Z7901 Long term (current) use of anticoagulants: Secondary | ICD-10-CM

## 2013-03-25 DIAGNOSIS — I253 Aneurysm of heart: Secondary | ICD-10-CM

## 2013-03-25 LAB — POCT INR: INR: 3

## 2013-04-04 ENCOUNTER — Ambulatory Visit (INDEPENDENT_AMBULATORY_CARE_PROVIDER_SITE_OTHER): Payer: Medicare Other | Admitting: *Deleted

## 2013-04-04 DIAGNOSIS — I253 Aneurysm of heart: Secondary | ICD-10-CM

## 2013-04-04 DIAGNOSIS — I2589 Other forms of chronic ischemic heart disease: Secondary | ICD-10-CM

## 2013-04-04 DIAGNOSIS — Z7901 Long term (current) use of anticoagulants: Secondary | ICD-10-CM

## 2013-04-04 LAB — MDC_IDC_ENUM_SESS_TYPE_INCLINIC
Brady Statistic RV Percent Paced: 0 %
HIGH POWER IMPEDANCE MEASURED VALUE: 39 Ohm
HIGH POWER IMPEDANCE MEASURED VALUE: 41 Ohm
HIGH POWER IMPEDANCE MEASURED VALUE: 42 Ohm
HIGH POWER IMPEDANCE MEASURED VALUE: 49 Ohm
HIGH POWER IMPEDANCE MEASURED VALUE: 49 Ohm
HIGH POWER IMPEDANCE MEASURED VALUE: 50 Ohm
HIGH POWER IMPEDANCE MEASURED VALUE: 51 Ohm
HIGH POWER IMPEDANCE MEASURED VALUE: 51 Ohm
HIGH POWER IMPEDANCE MEASURED VALUE: 52 Ohm
HIGH POWER IMPEDANCE MEASURED VALUE: 52 Ohm
HighPow Impedance: 40 Ohm
HighPow Impedance: 40 Ohm
HighPow Impedance: 41 Ohm
HighPow Impedance: 41 Ohm
HighPow Impedance: 41 Ohm
HighPow Impedance: 41 Ohm
HighPow Impedance: 42 Ohm
HighPow Impedance: 42 Ohm
HighPow Impedance: 42 Ohm
HighPow Impedance: 42 Ohm
HighPow Impedance: 42 Ohm
HighPow Impedance: 43 Ohm
HighPow Impedance: 48 Ohm
HighPow Impedance: 49 Ohm
HighPow Impedance: 49 Ohm
HighPow Impedance: 51 Ohm
HighPow Impedance: 51 Ohm
HighPow Impedance: 51 Ohm
HighPow Impedance: 52 Ohm
HighPow Impedance: 53 Ohm
Lead Channel Impedance Value: 340 Ohm
Lead Channel Impedance Value: 340 Ohm
Lead Channel Impedance Value: 348 Ohm
Lead Channel Impedance Value: 348 Ohm
Lead Channel Impedance Value: 348 Ohm
Lead Channel Impedance Value: 348 Ohm
Lead Channel Impedance Value: 348 Ohm
Lead Channel Impedance Value: 352 Ohm
Lead Channel Impedance Value: 352 Ohm
Lead Channel Impedance Value: 352 Ohm
Lead Channel Impedance Value: 352 Ohm
Lead Channel Impedance Value: 352 Ohm
Lead Channel Impedance Value: 368 Ohm
Lead Channel Impedance Value: 368 Ohm
Lead Channel Pacing Threshold Pulse Width: 0.4 ms
Lead Channel Sensing Intrinsic Amplitude: 14.8 mV
Lead Channel Sensing Intrinsic Amplitude: 15 mV
Lead Channel Sensing Intrinsic Amplitude: 15.4 mV
Lead Channel Sensing Intrinsic Amplitude: 15.4 mV
Lead Channel Sensing Intrinsic Amplitude: 15.8 mV
Lead Channel Sensing Intrinsic Amplitude: 15.8 mV
Lead Channel Sensing Intrinsic Amplitude: 15.9 mV
Lead Channel Sensing Intrinsic Amplitude: 16 mV
Lead Channel Sensing Intrinsic Amplitude: 16.3 mV
Lead Channel Setting Pacing Amplitude: 3 V
Lead Channel Setting Pacing Pulse Width: 0.9 ms
MDC IDC MSMT BATTERY VOLTAGE: 2.64 V
MDC IDC MSMT LEADCHNL RV IMPEDANCE VALUE: 336 Ohm
MDC IDC MSMT LEADCHNL RV IMPEDANCE VALUE: 348 Ohm
MDC IDC MSMT LEADCHNL RV PACING THRESHOLD AMPLITUDE: 1.5 V
MDC IDC MSMT LEADCHNL RV SENSING INTR AMPL: 14.8 mV
MDC IDC MSMT LEADCHNL RV SENSING INTR AMPL: 15.4 mV
MDC IDC MSMT LEADCHNL RV SENSING INTR AMPL: 15.4 mV
MDC IDC MSMT LEADCHNL RV SENSING INTR AMPL: 15.4 mV
MDC IDC MSMT LEADCHNL RV SENSING INTR AMPL: 15.6 mV
MDC IDC MSMT LEADCHNL RV SENSING INTR AMPL: 15.9 mV
MDC IDC SESS DTM: 20150112095453
MDC IDC SET LEADCHNL RV SENSING SENSITIVITY: 0.3 mV
MDC IDC SET ZONE DETECTION INTERVAL: 240 ms
MDC IDC SET ZONE DETECTION INTERVAL: 300 ms
Zone Setting Detection Interval: 350 ms

## 2013-04-04 LAB — POCT INR: INR: 2.9

## 2013-04-04 NOTE — Progress Notes (Signed)
ICD check in clinic. Normal device function. Thresholds and sensing consistent with previous device measurements. Impedance trends stable over time. No evidence of any ventricular arrhythmias. No mode switches. Histogram distribution appropriate for patient and level of activity. No changes made this session. Device programmed at appropriate safety margins. Device programmed to optimize intrinsic conduction.  Patient education completed including shock plan. Alert tones/vibration demonstrated for patient.

## 2013-04-08 ENCOUNTER — Other Ambulatory Visit: Payer: Self-pay | Admitting: Internal Medicine

## 2013-04-08 ENCOUNTER — Ambulatory Visit (HOSPITAL_COMMUNITY)
Admission: RE | Admit: 2013-04-08 | Discharge: 2013-04-08 | Disposition: A | Discharge status: Home / Self Care | Payer: Medicare Other | Source: Ambulatory Visit | Attending: Internal Medicine | Admitting: Internal Medicine

## 2013-04-08 ENCOUNTER — Encounter (HOSPITAL_COMMUNITY): Payer: Self-pay

## 2013-04-08 VITALS — BP 122/64 | HR 64 | Ht <= 58 in | Wt 146.1 lb

## 2013-04-08 DIAGNOSIS — I513 Intracardiac thrombosis, not elsewhere classified: Secondary | ICD-10-CM

## 2013-04-08 DIAGNOSIS — I2589 Other forms of chronic ischemic heart disease: Secondary | ICD-10-CM | POA: Insufficient documentation

## 2013-04-08 DIAGNOSIS — I4949 Other premature depolarization: Secondary | ICD-10-CM | POA: Insufficient documentation

## 2013-04-08 DIAGNOSIS — Z95810 Presence of automatic (implantable) cardiac defibrillator: Secondary | ICD-10-CM | POA: Insufficient documentation

## 2013-04-08 DIAGNOSIS — Z7901 Long term (current) use of anticoagulants: Secondary | ICD-10-CM | POA: Insufficient documentation

## 2013-04-08 DIAGNOSIS — E7800 Pure hypercholesterolemia, unspecified: Secondary | ICD-10-CM | POA: Insufficient documentation

## 2013-04-08 DIAGNOSIS — I219 Acute myocardial infarction, unspecified: Secondary | ICD-10-CM

## 2013-04-08 DIAGNOSIS — Z79899 Other long term (current) drug therapy: Secondary | ICD-10-CM | POA: Insufficient documentation

## 2013-04-08 DIAGNOSIS — R002 Palpitations: Secondary | ICD-10-CM | POA: Insufficient documentation

## 2013-04-08 DIAGNOSIS — I509 Heart failure, unspecified: Secondary | ICD-10-CM | POA: Insufficient documentation

## 2013-04-08 DIAGNOSIS — I2510 Atherosclerotic heart disease of native coronary artery without angina pectoris: Secondary | ICD-10-CM | POA: Insufficient documentation

## 2013-04-08 DIAGNOSIS — I253 Aneurysm of heart: Secondary | ICD-10-CM

## 2013-04-08 DIAGNOSIS — N189 Chronic kidney disease, unspecified: Secondary | ICD-10-CM | POA: Insufficient documentation

## 2013-04-08 DIAGNOSIS — E785 Hyperlipidemia, unspecified: Secondary | ICD-10-CM | POA: Insufficient documentation

## 2013-04-08 DIAGNOSIS — I252 Old myocardial infarction: Secondary | ICD-10-CM | POA: Insufficient documentation

## 2013-04-08 DIAGNOSIS — I5022 Chronic systolic (congestive) heart failure: Secondary | ICD-10-CM | POA: Insufficient documentation

## 2013-04-08 DIAGNOSIS — I129 Hypertensive chronic kidney disease with stage 1 through stage 4 chronic kidney disease, or unspecified chronic kidney disease: Secondary | ICD-10-CM | POA: Insufficient documentation

## 2013-04-08 LAB — BASIC METABOLIC PANEL
BUN: 38 mg/dL — AB (ref 6–23)
CO2: 27 meq/L (ref 19–32)
Calcium: 9.5 mg/dL (ref 8.4–10.5)
Chloride: 99 mEq/L (ref 96–112)
Creatinine, Ser: 1.44 mg/dL — ABNORMAL HIGH (ref 0.50–1.10)
GFR calc Af Amer: 37 mL/min — ABNORMAL LOW (ref >90)
GFR calc non Af Amer: 32 mL/min — ABNORMAL LOW (ref >90)
GLUCOSE: 128 mg/dL — AB (ref 70–99)
Potassium: 4.3 mEq/L (ref 3.7–5.3)
Sodium: 141 mEq/L (ref 137–147)

## 2013-04-08 LAB — PRO B NATRIURETIC PEPTIDE: Pro B Natriuretic peptide (BNP): 943.5 pg/mL — ABNORMAL HIGH (ref 0–450)

## 2013-04-08 MED ORDER — LOSARTAN POTASSIUM 25 MG PO TABS: 25.0000 mg | ORAL_TABLET | Freq: Every day | ORAL | Status: DC

## 2013-04-08 NOTE — Progress Notes (Signed)
Patient ID: [name], female   DOB: 02-21-1926, 80 y.o.   MRN: [id number] PCP: Dr. Maudie Mercury  80 yo with history of CAD, ischemic CMP and apical aneurysm with LV thrombus presents for cardiology followup. Last echo in 7/13 showed EF 25-30% with apical aneurysm.  Lexiscan myoview in 7/13 showed a large fixed inferolateral defect and a large predominantly fixed apical defect.  There was no significant ischemia and she was managed medically.    She is stable.  She is not short of breath walking in her house but is dyspneic walking longer distances or with getting dressed.  She has been sleeping in a recliner for about a year but this is because of her back.  She continues to have back/hip pain from arthritis.  No chest pain.  No PND.  No tachypalpitations. Weight is stable.   Labs (12/11): K 4.1, creatinine 1.1 Labs (4/12): K 4.6, creatinine 1.2, BUN 43, BNP 271 Labs (6/12): K 4, creatinine 1.3, LDL 66, HDL 44 Labs (10/12): K 4.8, creatinine 1.3 Labs (3/13): K 4.7, creatinine 1.4 Labs (4/13): K 4.6, creatinine 1.22, LDL 71, HDL 45 Labs (7/13): K 4.2, creatinine 1.2 Labs (10/13): K 3.8, creatinine 1.1, BNP 171 Labs (11/13): K 4, creatinine 1.2, LDL 80, HDL 46 Labs (1/14): K 3.9, creatinine 1.2 Labs (2/14): LDL 72, HDL 50 Labs (5/14): K 4, creatinine 1.5, BNP 214 Labs (9/14): K 4.4, creatinine 1.4, BNP 220, LDL 82, HDL 44 Labs (10/14): K 3.7, creatinine 1.5  Allergies (verified):  1)  ! Codeine 2)  ! Veneda Melter  Past Medical History: 1. HYPERCHOLESTEROLEMIA (ICD-272.0) 2. ARTHRITIS (ICD-716.90): Severe low back pain.  3. ISCHEMIC CARDIOMYOPATHY (ICD-414.8): Echo (1/10) with EF 25-30%, mildly dilated LV, periapical LV aneurysm, calcified LV thrombus, moderate MR.  Medtronic ICD set to VVI.  Echo (7/12) with EF 25-30%, mid-apical anteroseptal, apical lateral, apical anterior akinesis and aneurysm of the true apex; no thrombus noted, moderate MR, PA systolic pressure 30 mmHg.  4. CAD (ICD-414.00):  Anterior MI in 2006 with Cypher DES to LAD.  Lexiscan myoview (7/13) with large fixed inferolateral defect and large predominantly fixed apical defect.  There was minimal ischemia so she was managed medically.  5. HYPERTENSION (ICD-401.9) 6. LV thrombus on coumadin.  7. Macular degeneration 8. CKD 9. Palpitations: Holter (9/14) with PACs and PVCs, no atrial fibrillation.   Social History: The patient lives alone near [place]. 3 daughters live nearby. Widowed Tobacco Use - No.  Alcohol Use - no  Family History:  CAD  ROS:  All systems reviewed and negative except as per HPI.    Current Outpatient Prescriptions  Medication Sig Dispense Refill  . atorvastatin (LIPITOR) 40 MG tablet TAKE 1 TABLET (40 MG TOTAL) BY MOUTH EVERY EVENING.  30 tablet  0  . carvedilol (COREG) 25 MG tablet TAKE ONE TABLET BY MOUTH TWICE DAILY  180 tablet  1  . cholecalciferol (VITAMIN D) 1000 UNITS tablet Take 1,000 Units by mouth daily.        . furosemide (LASIX) 40 MG tablet Take 1 tablet (40 mg total) by mouth 2 (two) times daily.  180 tablet  1  . losartan (COZAAR) 25 MG tablet Take 1 tablet (25 mg total) by mouth daily.  90 tablet  3  . Multiple Vitamin (MULTIVITAMIN) tablet Take 1 tablet by mouth daily.        . nitroGLYCERIN (NITROSTAT) 0.4 MG SL tablet Place 1 tablet (0.4 mg total) under the tongue every 5 (  five) minutes as needed.  30 tablet  2  . Omega-3 Fatty Acids (FISH OIL) 1000 MG CAPS Take by mouth daily.        . sertraline (ZOLOFT) 50 MG tablet Take 50 mg by mouth daily.       Marland Kitchen spironolactone (ALDACTONE) 25 MG tablet TAKE ONE TABLET BY MOUTH ONE TIME DAILY  30 tablet  5  . warfarin (COUMADIN) 2.5 MG tablet Take as directed by anticoagulation clinic  30 tablet  3   No current facility-administered medications for this encounter.    BP 122/64  Pulse 64  Ht 4\' 10"  (1.473 m)  Wt 146 lb 1.9 oz (66.28 kg)  BMI 30.55 kg/m2  SpO2 95% General:  Elderly woman in no distress.  Neck:  Neck  supple, JVP 7 cm. No masses, thyromegaly or abnormal cervical nodes. Lungs:  Clear bilaterally. Heart:  Non-displaced PMI, chest non-tender; regular rate and rhythm, S1, S2 without rubs or gallops. 2/6 HSM at apex.  Carotid upstroke normal, no bruit.  Pedals normal pulses. No peripheral edema. Abdomen:  Bowel sounds positive; abdomen soft and non-tender without masses, organomegaly, or hernias noted. No hepatosplenomegaly. Extremities:  No clubbing or cyanosis. Neurologic:  Alert and oriented x 3. Psych:  Normal affect.  Assessment/Plan:  1. CAD   No chest pain.  Myoview in 7/13 showed scar without significant ischemia.  Continue statin, ARB, beta blocker. She is on warfarin so she does not need to take ASA 81.  2. HYPERLIPIDEMIA Continue statin, good lipids in 9/14. 3. Systolic CHF, chronic   NYHA class III symptoms. She is limited but this is stable.    - Volume ok, continue current Lasix. - Continue current spironolactone, losartan and Coreg.  Creatinine has risen with prior attempts to uptitrate losartan.  - Check BMET/BNP today. - I would like her to try to be more active.  I suggested walking for 10 minutes/day in her house, then increase to 20 minutes/day.  4. VENTRICULAR ANEURYSM, LEFT  Apical aneurysm with thrombus, not seen on last echo. Continue warfarin. No falls but some gait instability. She is using her cane.  5. Palpitations No atrial fibrillation seen on last holter monitor.  Not as prominent at this time.   Loralie Champagne 04/08/2013

## 2013-04-08 NOTE — Patient Instructions (Signed)
Labs today  We will contact you in 4 months to schedule your next appointment.  

## 2013-04-14 ENCOUNTER — Encounter: Payer: Self-pay | Admitting: Internal Medicine

## 2013-04-27 ENCOUNTER — Other Ambulatory Visit: Payer: Self-pay | Admitting: Cardiology

## 2013-05-02 ENCOUNTER — Ambulatory Visit (INDEPENDENT_AMBULATORY_CARE_PROVIDER_SITE_OTHER): Payer: Medicare Other | Admitting: *Deleted

## 2013-05-02 DIAGNOSIS — Z7901 Long term (current) use of anticoagulants: Secondary | ICD-10-CM

## 2013-05-02 DIAGNOSIS — I253 Aneurysm of heart: Secondary | ICD-10-CM

## 2013-05-02 DIAGNOSIS — Z5181 Encounter for therapeutic drug level monitoring: Secondary | ICD-10-CM

## 2013-05-02 LAB — POCT INR: INR: 2.4

## 2013-05-18 ENCOUNTER — Other Ambulatory Visit: Payer: Self-pay | Admitting: Cardiology

## 2013-05-24 ENCOUNTER — Other Ambulatory Visit: Payer: Self-pay | Admitting: Internal Medicine

## 2013-05-24 DIAGNOSIS — R51 Headache: Principal | ICD-10-CM

## 2013-05-24 DIAGNOSIS — R42 Dizziness and giddiness: Secondary | ICD-10-CM

## 2013-05-24 DIAGNOSIS — R519 Headache, unspecified: Secondary | ICD-10-CM

## 2013-05-25 ENCOUNTER — Ambulatory Visit
Admission: RE | Admit: 2013-05-25 | Discharge: 2013-05-25 | Disposition: A | Discharge status: Home / Self Care | Payer: Medicare Other | Source: Ambulatory Visit | Attending: Internal Medicine | Admitting: Internal Medicine

## 2013-05-25 DIAGNOSIS — R519 Headache, unspecified: Secondary | ICD-10-CM

## 2013-05-25 DIAGNOSIS — R51 Headache: Principal | ICD-10-CM

## 2013-05-25 DIAGNOSIS — R42 Dizziness and giddiness: Secondary | ICD-10-CM

## 2013-05-30 ENCOUNTER — Ambulatory Visit (INDEPENDENT_AMBULATORY_CARE_PROVIDER_SITE_OTHER): Payer: Medicare Other

## 2013-05-30 DIAGNOSIS — I253 Aneurysm of heart: Secondary | ICD-10-CM

## 2013-05-30 DIAGNOSIS — Z5181 Encounter for therapeutic drug level monitoring: Secondary | ICD-10-CM

## 2013-05-30 DIAGNOSIS — Z7901 Long term (current) use of anticoagulants: Secondary | ICD-10-CM

## 2013-05-30 LAB — POCT INR: INR: 4.6

## 2013-06-10 ENCOUNTER — Ambulatory Visit (INDEPENDENT_AMBULATORY_CARE_PROVIDER_SITE_OTHER): Payer: Medicare Other | Admitting: *Deleted

## 2013-06-10 DIAGNOSIS — I253 Aneurysm of heart: Secondary | ICD-10-CM

## 2013-06-10 DIAGNOSIS — Z7901 Long term (current) use of anticoagulants: Secondary | ICD-10-CM

## 2013-06-10 DIAGNOSIS — Z5181 Encounter for therapeutic drug level monitoring: Secondary | ICD-10-CM

## 2013-06-10 LAB — POCT INR: INR: 2

## 2013-06-20 ENCOUNTER — Encounter: Payer: Self-pay | Admitting: Neurology

## 2013-06-21 ENCOUNTER — Encounter (INDEPENDENT_AMBULATORY_CARE_PROVIDER_SITE_OTHER): Payer: Self-pay

## 2013-06-21 ENCOUNTER — Ambulatory Visit (INDEPENDENT_AMBULATORY_CARE_PROVIDER_SITE_OTHER): Payer: Medicare Other | Admitting: Neurology

## 2013-06-21 ENCOUNTER — Encounter: Payer: Self-pay | Admitting: Neurology

## 2013-06-21 VITALS — BP 144/77 | HR 67 | Ht <= 58 in | Wt 147.0 lb

## 2013-06-21 DIAGNOSIS — H8110 Benign paroxysmal vertigo, unspecified ear: Secondary | ICD-10-CM

## 2013-06-21 NOTE — Patient Instructions (Addendum)
Overall you are doing fairly well but I do want to suggest a few things today:   Remember to drink plenty of fluid, eat healthy meals and do not skip any meals. Try to eat protein with a every meal and eat a healthy snack such as fruit or nuts in between meals. Try to keep a regular sleep-wake schedule and try to exercise daily, particularly in the form of walking, 20-30 minutes a day, if you can.   I would like you to work with vestibular rehab, they will call you to schedule this.  Follow up as needed  My clinical assistant and will answer any of your questions and relay your messages to me and also relay most of my messages to you.   Our phone number is [phone]. We also have an after hours call service for urgent matters and there is a physician on-call for urgent questions. For any emergencies you know to call 911 or go to the nearest emergency room

## 2013-06-21 NOTE — Progress Notes (Signed)
[facility]    Provider:  Dr Janann Colonel Referring Provider: [name], MD Primary Care Physician:  [name], MD  CC:  Dizziness and gait instability  HPI:  [name] is a 80 y.o. female here as a referral from Dr. Maudie Mercury for dizziness and gait instability. A few weeks ago notes acute onset of blurry vision, had trouble standing up due to weakness and instability. Went to bed, next morning called Dr. Maudie Mercury and had a head CT. Since then has had intermittent episodes of sensation of room spinning and feeling like she is on a boat. Notes episodes are occuring daily, comes on randomly, notes symptoms can be triggered by turning head to the right or left. Can last up to 1 hour, gets better on its own. Happens more when she is up walking around. Notes for the past few days has had trouble seeing, notes increased blurry vision. No falls.  Has HTN, HLD, DM.   Review of Systems: Out of a complete 14 system review, the patient complains of only the following symptoms, and all other reviewed systems are negative. + gait instability  History   Social History  . Marital Status: Widowed    Spouse Name: N/A    Number of Children: 3  . Years of Education: 12   Occupational History  .      retired   Social History Main Topics  . Smoking status: Never Smoker   . Smokeless tobacco: Never Used  . Alcohol Use: No  . Drug Use: No  . Sexual Activity: Not on file   Other Topics Concern  . Not on file   Social History Narrative   Pt is widowed and lives alone near [place]. 3 daughters live nearby   Retired   Education high school    Right handed   Caffeine one cup of coffee daily             Family History  Problem Relation Age of Onset  . Breast cancer Mother   . Stroke Father   . Stroke Brother   . Diabetes Brother     Past Medical History  Diagnosis Date  . Pure hypercholesterolemia   . Arthritis     severe lower back pain  . Other specified forms of  chronic ischemic heart disease     echo (1/10) with EF 25-30%, mildly dilated LV, periapical LV aneueysm, calcified LV thombus, moderate MR. Medtronic ICD set to VVI.   Marland Kitchen CAD (coronary artery disease)     anterior MI in 2006 with Cypher DES to LAD  . HTN (hypertension)   . LV (left ventricular) mural thrombus     on coumadin   . Nephrolithiasis   . CHF (congestive heart failure)   . Cardiomyopathy   . OA (osteoarthritis) of hip   . Osteoporosis   . On home oxygen therapy     at night    Past Surgical History  Procedure Laterality Date  . Aicd implantation      ICD-Medtronic. Remote-yes   . Coronary angioplasty with stent placement    . Coronary angioplasty    . Cardiac catheterization    . Vesicovaginal fistula closure w/ tah  1978    Current Outpatient Prescriptions  Medication Sig Dispense Refill  . acetaminophen (TYLENOL) 325 MG tablet Take 325 mg by mouth every 6 (six) hours as needed.      Marland Kitchen atorvastatin (LIPITOR) 40 MG tablet TAKE 1 TABLET (40 MG TOTAL) BY  MOUTH EVERY EVENING.  30 tablet  6  . carvedilol (COREG) 25 MG tablet TAKE ONE TABLET BY MOUTH TWICE DAILY  180 tablet  1  . cholecalciferol (VITAMIN D) 1000 UNITS tablet Take 1,000 Units by mouth daily.        . furosemide (LASIX) 40 MG tablet Take 1 tablet (40 mg total) by mouth 2 (two) times daily.  180 tablet  1  . Glucosamine 500 MG CAPS Take 500 mg by mouth.      . losartan (COZAAR) 25 MG tablet Take 1 tablet (25 mg total) by mouth daily.  90 tablet  3  . meclizine (ANTIVERT) 25 MG tablet Take 25 mg by mouth 3 (three) times daily as needed for dizziness.      . Multiple Vitamin (MULTIVITAMIN) tablet Take 1 tablet by mouth daily.        . Multiple Vitamins-Minerals (ICAPS) CAPS Take by mouth.      . nitroGLYCERIN (NITROSTAT) 0.4 MG SL tablet Place 1 tablet (0.4 mg total) under the tongue every 5 (five) minutes as needed.  30 tablet  2  . nystatin-triamcinolone (MYCOLOG II) cream Apply 1 application topically 2  (two) times daily.      . Omega-3 Fatty Acids (FISH OIL) 1000 MG CAPS Take by mouth daily.        . polyethylene glycol powder (MIRALAX) powder Take 1 Container by mouth once.      . saxagliptin HCl (ONGLYZA) 2.5 MG TABS tablet Take 2.5 mg by mouth daily.      . sertraline (ZOLOFT) 50 MG tablet Take 50 mg by mouth daily.       Marland Kitchen spironolactone (ALDACTONE) 25 MG tablet TAKE ONE TABLET BY MOUTH ONE TIME DAILY  90 tablet  0  . trimethoprim (TRIMPEX) 100 MG tablet Take 100 mg by mouth 2 (two) times daily.      Marland Kitchen warfarin (COUMADIN) 2.5 MG tablet TAKE AS DIRECTED BY ANTICOAGULATION CLINIC  30 tablet  2   No current facility-administered medications for this visit.    Allergies as of 06/21/2013 - Review Complete 06/21/2013  Allergen Reaction Noted  . Codeine  06/26/2008    Vitals: BP 144/77  Pulse 67  Ht 4\' 10"  (1.473 m)  Wt 147 lb (66.679 kg)  BMI 30.73 kg/m2 Last Weight:  Wt Readings from Last 1 Encounters:  06/21/13 147 lb (66.679 kg)   Last Height:   Ht Readings from Last 1 Encounters:  06/21/13 4\' 10"  (1.473 m)     Physical exam: Exam: Gen: NAD, conversant Eyes: anicteric sclerae, moist conjunctivae HENT: Atraumatic, oropharynx clear Neck: Trachea midline; supple,  Lungs: CTA, no wheezing, rales, rhonic                          CV: RRR, no MRG Abdomen: Soft, non-tender;  Extremities: No peripheral edema  Skin: Normal temperature, no rash,  Psych: Appropriate affect, pleasant  Neuro: MS: AA&Ox3, appropriately interactive, normal affect   Speech: fluent w/o paraphasic error  Memory: good recent and remote recall  CN: PERRL, EOMI no nystagmus, no ptosis, sensation intact to LT V1-V3 bilat, face symmetric, no weakness, hearing grossly intact, palate elevates symmetrically, shoulder shrug 5/5 bilat,  tongue protrudes midline, no fasiculations noted.  Motor: normal bulk and tone Strength: 5/5  In all extremities  Coord: rapid alternating and point-to-point (FNF,  HTS) movements intact.  Reflexes: symmetrical, bilat downgoing toes  Sens: LT intact in all extremities  Gait: posture, stance, stride and arm-swing normal. Tandem gait intact. Able to walk on heels and toes. Romberg absent.   Assessment:  After physical and neurologic examination, review of laboratory studies, imaging, neurophysiology testing and pre-existing records, assessment will be reviewed on the problem list.  Plan:  Treatment plan and additional workup will be reviewed under Problem List.  1)Vertigo  80y/o woman presenting for initial evaluation of transient episodes of vertigo which have been occuring intermittently for the past few weeks. Physical exam is overall unremarkable. Based on exam and history this is most consistent with a diagnosis of benign paroxysmal vertigo. Head CT was unremarkable, unable to have MRI due to ICD. Patient does not seem to be getting much benefit from meclizine, likely due to short duration of symptoms. Will refer to vestibular rehab, patient hesitant to do this but agrees to try it. Follow up as needed.   Jim Like, DO  [facility] [address] [place], [place] [address]  Phone [phone] Fax [phone]

## 2013-07-05 ENCOUNTER — Ambulatory Visit (INDEPENDENT_AMBULATORY_CARE_PROVIDER_SITE_OTHER): Payer: Medicare Other | Admitting: Pharmacist

## 2013-07-05 ENCOUNTER — Encounter: Payer: Self-pay | Admitting: Internal Medicine

## 2013-07-05 ENCOUNTER — Ambulatory Visit: Payer: Medicare Other | Attending: Neurology | Admitting: Rehabilitative and Restorative Service Providers"

## 2013-07-05 ENCOUNTER — Ambulatory Visit (INDEPENDENT_AMBULATORY_CARE_PROVIDER_SITE_OTHER): Payer: Medicare Other | Admitting: Internal Medicine

## 2013-07-05 VITALS — BP 136/74 | HR 62 | Ht 59.0 in | Wt 148.0 lb

## 2013-07-05 DIAGNOSIS — I2589 Other forms of chronic ischemic heart disease: Secondary | ICD-10-CM

## 2013-07-05 DIAGNOSIS — IMO0001 Reserved for inherently not codable concepts without codable children: Secondary | ICD-10-CM | POA: Insufficient documentation

## 2013-07-05 DIAGNOSIS — Z7901 Long term (current) use of anticoagulants: Secondary | ICD-10-CM

## 2013-07-05 DIAGNOSIS — I472 Ventricular tachycardia, unspecified: Secondary | ICD-10-CM

## 2013-07-05 DIAGNOSIS — Z95810 Presence of automatic (implantable) cardiac defibrillator: Secondary | ICD-10-CM

## 2013-07-05 DIAGNOSIS — R42 Dizziness and giddiness: Secondary | ICD-10-CM | POA: Insufficient documentation

## 2013-07-05 DIAGNOSIS — Z5181 Encounter for therapeutic drug level monitoring: Secondary | ICD-10-CM

## 2013-07-05 DIAGNOSIS — I253 Aneurysm of heart: Secondary | ICD-10-CM

## 2013-07-05 DIAGNOSIS — I4729 Other ventricular tachycardia: Secondary | ICD-10-CM

## 2013-07-05 DIAGNOSIS — R269 Unspecified abnormalities of gait and mobility: Secondary | ICD-10-CM | POA: Insufficient documentation

## 2013-07-05 LAB — MDC_IDC_ENUM_SESS_TYPE_INCLINIC
Brady Statistic RV Percent Paced: 0 %
Date Time Interrogation Session: 20150414101756
HIGH POWER IMPEDANCE MEASURED VALUE: 39 Ohm
HIGH POWER IMPEDANCE MEASURED VALUE: 40 Ohm
HIGH POWER IMPEDANCE MEASURED VALUE: 41 Ohm
HIGH POWER IMPEDANCE MEASURED VALUE: 42 Ohm
HIGH POWER IMPEDANCE MEASURED VALUE: 42 Ohm
HIGH POWER IMPEDANCE MEASURED VALUE: 43 Ohm
HIGH POWER IMPEDANCE MEASURED VALUE: 51 Ohm
HIGH POWER IMPEDANCE MEASURED VALUE: 52 Ohm
HIGH POWER IMPEDANCE MEASURED VALUE: 52 Ohm
HIGH POWER IMPEDANCE MEASURED VALUE: 54 Ohm
HIGH POWER IMPEDANCE MEASURED VALUE: 55 Ohm
HighPow Impedance: 39 Ohm
HighPow Impedance: 40 Ohm
HighPow Impedance: 42 Ohm
HighPow Impedance: 42 Ohm
HighPow Impedance: 42 Ohm
HighPow Impedance: 43 Ohm
HighPow Impedance: 43 Ohm
HighPow Impedance: 43 Ohm
HighPow Impedance: 43 Ohm
HighPow Impedance: 44 Ohm
HighPow Impedance: 48 Ohm
HighPow Impedance: 49 Ohm
HighPow Impedance: 50 Ohm
HighPow Impedance: 51 Ohm
HighPow Impedance: 52 Ohm
HighPow Impedance: 52 Ohm
HighPow Impedance: 52 Ohm
HighPow Impedance: 52 Ohm
HighPow Impedance: 53 Ohm
HighPow Impedance: 54 Ohm
Lead Channel Impedance Value: 336 Ohm
Lead Channel Impedance Value: 336 Ohm
Lead Channel Impedance Value: 344 Ohm
Lead Channel Impedance Value: 348 Ohm
Lead Channel Impedance Value: 352 Ohm
Lead Channel Impedance Value: 352 Ohm
Lead Channel Impedance Value: 352 Ohm
Lead Channel Impedance Value: 360 Ohm
Lead Channel Impedance Value: 368 Ohm
Lead Channel Pacing Threshold Pulse Width: 0.4 ms
Lead Channel Sensing Intrinsic Amplitude: 14.7 mV
Lead Channel Sensing Intrinsic Amplitude: 14.8 mV
Lead Channel Sensing Intrinsic Amplitude: 15.1 mV
Lead Channel Sensing Intrinsic Amplitude: 15.5 mV
Lead Channel Sensing Intrinsic Amplitude: 15.8 mV
Lead Channel Sensing Intrinsic Amplitude: 16.1 mV
Lead Channel Sensing Intrinsic Amplitude: 16.2 mV
Lead Channel Sensing Intrinsic Amplitude: 16.4 mV
Lead Channel Sensing Intrinsic Amplitude: 16.6 mV
Lead Channel Sensing Intrinsic Amplitude: 16.6 mV
MDC IDC MSMT BATTERY VOLTAGE: 2.64 V
MDC IDC MSMT LEADCHNL RV IMPEDANCE VALUE: 344 Ohm
MDC IDC MSMT LEADCHNL RV IMPEDANCE VALUE: 344 Ohm
MDC IDC MSMT LEADCHNL RV IMPEDANCE VALUE: 352 Ohm
MDC IDC MSMT LEADCHNL RV IMPEDANCE VALUE: 360 Ohm
MDC IDC MSMT LEADCHNL RV IMPEDANCE VALUE: 360 Ohm
MDC IDC MSMT LEADCHNL RV IMPEDANCE VALUE: 368 Ohm
MDC IDC MSMT LEADCHNL RV PACING THRESHOLD AMPLITUDE: 1.5 V
MDC IDC MSMT LEADCHNL RV SENSING INTR AMPL: 15.5 mV
MDC IDC MSMT LEADCHNL RV SENSING INTR AMPL: 15.7 mV
MDC IDC MSMT LEADCHNL RV SENSING INTR AMPL: 15.8 mV
MDC IDC MSMT LEADCHNL RV SENSING INTR AMPL: 16 mV
MDC IDC MSMT LEADCHNL RV SENSING INTR AMPL: 16.2 mV
MDC IDC SET LEADCHNL RV PACING AMPLITUDE: 3 V
MDC IDC SET LEADCHNL RV PACING PULSEWIDTH: 0.9 ms
MDC IDC SET LEADCHNL RV SENSING SENSITIVITY: 0.3 mV
Zone Setting Detection Interval: 240 ms
Zone Setting Detection Interval: 300 ms
Zone Setting Detection Interval: 350 ms

## 2013-07-05 LAB — POCT INR: INR: 3

## 2013-07-05 NOTE — Patient Instructions (Signed)
Your physician wants you to follow-up in: 12 months with Dr Knox Saliva will receive a reminder letter in the mail two months in advance. If you don't receive a letter, please call our office to schedule the follow-up appointment.   Remote monitoring is used to monitor your Pacemaker of ICD from home. This monitoring reduces the number of office visits required to check your device to one time per year. It allows [place] to keep an eye on the functioning of your device to ensure it is working properly. You are scheduled for a device check from home on 08/04/13. You may send your transmission at any time that day. If you have a wireless device, the transmission will be sent automatically. After your physician reviews your transmission, you will receive a postcard with your next transmission date.

## 2013-07-05 NOTE — Assessment & Plan Note (Signed)
Her Medtronic ICD is working normally. Will recheck in several months. We discussed both the pro's and con's of ICD generator change out vs not having a new implant.

## 2013-07-05 NOTE — Assessment & Plan Note (Signed)
She denies anginal symptoms. Will follow.  

## 2013-07-05 NOTE — Assessment & Plan Note (Signed)
She has had no recurrent ventricular arrhythmias. Will follow. 

## 2013-07-05 NOTE — Progress Notes (Signed)
HPI [name] returns today for followup of her ICD. She is a pleasant 80 yo woman with an ICM, chronic systolic heart failure, s/p ICD implant. In the interim, he has done well. He denies chest pain or sob. No syncope. She had what has been described as vertigo and is undergoing evaluation. She denies any ICD shocks or syncope.  Allergies  Allergen Reactions  . Codeine      Current Outpatient Prescriptions  Medication Sig Dispense Refill  . acetaminophen (TYLENOL) 325 MG tablet Take 325 mg by mouth every 6 (six) hours as needed.      Marland Kitchen atorvastatin (LIPITOR) 40 MG tablet TAKE 1 TABLET (40 MG TOTAL) BY MOUTH EVERY EVENING.  30 tablet  6  . carvedilol (COREG) 25 MG tablet TAKE ONE TABLET BY MOUTH TWICE DAILY  180 tablet  1  . cholecalciferol (VITAMIN D) 1000 UNITS tablet Take 1,000 Units by mouth daily.        . furosemide (LASIX) 40 MG tablet Take 1 tablet (40 mg total) by mouth 2 (two) times daily.  180 tablet  1  . Glucosamine 500 MG CAPS Take 500 mg by mouth daily.       Marland Kitchen losartan (COZAAR) 25 MG tablet Take 1 tablet (25 mg total) by mouth daily.  90 tablet  3  . meclizine (ANTIVERT) 25 MG tablet Take 25 mg by mouth 3 (three) times daily as needed for dizziness.      . Multiple Vitamin (MULTIVITAMIN) tablet Take 1 tablet by mouth daily.        . Multiple Vitamins-Minerals (ICAPS) CAPS Take by mouth.      . nitroGLYCERIN (NITROSTAT) 0.4 MG SL tablet Place 1 tablet (0.4 mg total) under the tongue every 5 (five) minutes as needed.  30 tablet  2  . Omega-3 Fatty Acids (FISH OIL) 1000 MG CAPS Take 1 capsule by mouth daily.       . polyethylene glycol powder (MIRALAX) powder Take 1 Container by mouth once.      . saxagliptin HCl (ONGLYZA) 2.5 MG TABS tablet Take 2.5 mg by mouth daily.      . sertraline (ZOLOFT) 50 MG tablet Take 50 mg by mouth daily.       Marland Kitchen spironolactone (ALDACTONE) 25 MG tablet TAKE ONE TABLET BY MOUTH ONE TIME DAILY  90 tablet  0  . trimethoprim (TRIMPEX) 100 MG  tablet Take 100 mg by mouth 2 (two) times daily.      Marland Kitchen warfarin (COUMADIN) 2.5 MG tablet TAKE AS DIRECTED BY ANTICOAGULATION CLINIC  30 tablet  2   No current facility-administered medications for this visit.     Past Medical History  Diagnosis Date  . Pure hypercholesterolemia   . Arthritis     severe lower back pain  . Other specified forms of chronic ischemic heart disease     echo (1/10) with EF 25-30%, mildly dilated LV, periapical LV aneueysm, calcified LV thombus, moderate MR. Medtronic ICD set to VVI.   Marland Kitchen CAD (coronary artery disease)     anterior MI in 2006 with Cypher DES to LAD  . HTN (hypertension)   . LV (left ventricular) mural thrombus     on coumadin   . Nephrolithiasis   . CHF (congestive heart failure)   . Cardiomyopathy   . OA (osteoarthritis) of hip   . Osteoporosis   . On home oxygen therapy     at night    ROS:  All systems reviewed and negative except as noted in the HPI.   Past Surgical History  Procedure Laterality Date  . Aicd implantation      ICD-Medtronic. Remote-yes   . Coronary angioplasty with stent placement    . Coronary angioplasty    . Cardiac catheterization    . Vesicovaginal fistula closure w/ tah  1978     Family History  Problem Relation Age of Onset  . Breast cancer Mother   . Stroke Father   . Stroke Brother   . Diabetes Brother      History   Social History  . Marital Status: Widowed    Spouse Name: N/A    Number of Children: 3  . Years of Education: 12   Occupational History  .      retired   Social History Main Topics  . Smoking status: Never Smoker   . Smokeless tobacco: Never Used  . Alcohol Use: No  . Drug Use: No  . Sexual Activity: Not on file   Other Topics Concern  . Not on file   Social History Narrative   Pt is widowed and lives alone near [place]. 3 daughters live nearby   Retired   Education high school    Right handed   Caffeine one cup of coffee daily              BP  136/74  Pulse 62  Ht 4\' 11"  (1.499 m)  Wt 148 lb (67.132 kg)  BMI 29.88 kg/m2  Physical Exam:  Well appearing 80 yo woman,NAD HEENT: Unremarkable Neck:  No JVD, no thyromegally Back:  No CVA tenderness Lungs:  Clear with no wheezes HEART:  Regular rate rhythm, no murmurs, no rubs, no clicks Abd:  soft, positive bowel sounds, no organomegally, no rebound, no guarding Ext:  2 plus pulses, no edema, no cyanosis, no clubbing Skin:  No rashes no nodules Neuro:  CN II through XII intact, motor grossly intact   DEVICE  Normal device function.  See PaceArt for details.   Assess/Plan:

## 2013-07-08 ENCOUNTER — Ambulatory Visit (HOSPITAL_COMMUNITY)
Admission: RE | Admit: 2013-07-08 | Discharge: 2013-07-08 | Disposition: A | Discharge status: Home / Self Care | Payer: Medicare Other | Source: Ambulatory Visit | Attending: Cardiology | Admitting: Cardiology

## 2013-07-08 ENCOUNTER — Encounter (HOSPITAL_COMMUNITY): Payer: Self-pay

## 2013-07-08 VITALS — BP 136/68 | HR 67 | Resp 20 | Ht 59.0 in | Wt 149.0 lb

## 2013-07-08 DIAGNOSIS — I509 Heart failure, unspecified: Secondary | ICD-10-CM

## 2013-07-08 DIAGNOSIS — E785 Hyperlipidemia, unspecified: Secondary | ICD-10-CM

## 2013-07-08 DIAGNOSIS — I5022 Chronic systolic (congestive) heart failure: Secondary | ICD-10-CM

## 2013-07-08 DIAGNOSIS — Z95810 Presence of automatic (implantable) cardiac defibrillator: Secondary | ICD-10-CM

## 2013-07-08 DIAGNOSIS — N189 Chronic kidney disease, unspecified: Secondary | ICD-10-CM

## 2013-07-08 LAB — BASIC METABOLIC PANEL
BUN: 39 mg/dL — ABNORMAL HIGH (ref 6–23)
CALCIUM: 9.5 mg/dL (ref 8.4–10.5)
CO2: 25 mEq/L (ref 19–32)
CREATININE: 1.41 mg/dL — AB (ref 0.50–1.10)
Chloride: 103 mEq/L (ref 96–112)
GFR calc Af Amer: 38 mL/min — ABNORMAL LOW (ref >90)
GFR, EST NON AFRICAN AMERICAN: 33 mL/min — AB (ref >90)
Glucose, Bld: 107 mg/dL — ABNORMAL HIGH (ref 70–99)
Potassium: 4.5 mEq/L (ref 3.7–5.3)
SODIUM: 143 meq/L (ref 137–147)

## 2013-07-08 LAB — PRO B NATRIURETIC PEPTIDE: PRO B NATRI PEPTIDE: 1249 pg/mL — AB (ref 0–450)

## 2013-07-08 NOTE — Patient Instructions (Signed)
Labs today  We will contact you in 3 months to schedule your next appointment.  

## 2013-07-10 NOTE — Progress Notes (Signed)
Patient ID: [name], female   DOB: 30-Jul-1925, 80 y.o.   MRN: [id number] PCP: Dr. Maudie Mercury  80 yo with history of CAD, ischemic CMP and apical aneurysm with LV thrombus presents for cardiology followup. Last echo in 7/13 showed EF 25-30% with apical aneurysm.  Lexiscan myoview in 7/13 showed a large fixed inferolateral defect and a large predominantly fixed apical defect.  There was no significant ischemia and she was managed medically.    She is not short of breath walking in her house but is dyspneic walking longer distances or with getting dressed this is chronic.  She has been sleeping in a recliner for over a year but this is because of her back.  She continues to have back/hip pain from arthritis.  She had one episode of chest pain last week after a large meal that resolved with Tums.  No exertional chest pain.  No PND.  No tachypalpitations. In 3/15, she started to develop nausea with head movement, also a sensation of spinning.  Head CT showed no acute changes.  She saw neurology and was thought to have BPPV.  This seems to have resolved over the last week. Of note, her ICD is nearing the need for a generator change.   Labs (12/11): K 4.1, creatinine 1.1 Labs (4/12): K 4.6, creatinine 1.2, BUN 43, BNP 271 Labs (6/12): K 4, creatinine 1.3, LDL 66, HDL 44 Labs (10/12): K 4.8, creatinine 1.3 Labs (3/13): K 4.7, creatinine 1.4 Labs (4/13): K 4.6, creatinine 1.22, LDL 71, HDL 45 Labs (7/13): K 4.2, creatinine 1.2 Labs (10/13): K 3.8, creatinine 1.1, BNP 171 Labs (11/13): K 4, creatinine 1.2, LDL 80, HDL 46 Labs (1/14): K 3.9, creatinine 1.2 Labs (2/14): LDL 72, HDL 50 Labs (5/14): K 4, creatinine 1.5, BNP 214 Labs (9/14): K 4.4, creatinine 1.4, BNP 220, LDL 82, HDL 44 Labs (10/14): K 3.7, creatinine 1.5 Labs (1/15): K 4.3, creatinine 1.44, BNP 944  Allergies (verified):  1)  ! Codeine 2)  ! Veneda Melter  Past Medical History: 1. HYPERCHOLESTEROLEMIA  2. ARTHRITIS: Severe low back pain.   3. ISCHEMIC CARDIOMYOPATHY: Echo (1/10) with EF 25-30%, mildly dilated LV, periapical LV aneurysm, calcified LV thrombus, moderate MR.  Medtronic ICD set to VVI.  Echo (7/12) with EF 25-30%, mid-apical anteroseptal, apical lateral, apical anterior akinesis and aneurysm of the true apex; no thrombus noted, moderate MR, PA systolic pressure 30 mmHg.  4. CAD: Anterior MI in 2006 with Cypher DES to LAD.  Lexiscan myoview (7/13) with large fixed inferolateral defect and large predominantly fixed apical defect.  There was minimal ischemia so she was managed medically.  5. HYPERTENSION 6. LV thrombus on coumadin.  7. Macular degeneration 8. CKD 9. Palpitations: Holter (9/14) with PACs and PVCs, no atrial fibrillation.  10. BPPV  Social History: The patient lives alone near [place]. 3 daughters live nearby. Widowed Tobacco Use - No.  Alcohol Use - no  Family History:  CAD  ROS:  All systems reviewed and negative except as per HPI.    Current Outpatient Prescriptions  Medication Sig Dispense Refill  . acetaminophen (TYLENOL) 325 MG tablet Take 325 mg by mouth every 6 (six) hours as needed.      Marland Kitchen atorvastatin (LIPITOR) 40 MG tablet TAKE 1 TABLET (40 MG TOTAL) BY MOUTH EVERY EVENING.  30 tablet  6  . carvedilol (COREG) 25 MG tablet TAKE ONE TABLET BY MOUTH TWICE DAILY  180 tablet  1  . cholecalciferol (VITAMIN D)  1000 UNITS tablet Take 1,000 Units by mouth daily.        . furosemide (LASIX) 40 MG tablet Take 1 tablet (40 mg total) by mouth 2 (two) times daily.  180 tablet  1  . Glucosamine 500 MG CAPS Take 500 mg by mouth daily.       Marland Kitchen losartan (COZAAR) 25 MG tablet Take 1 tablet (25 mg total) by mouth daily.  90 tablet  3  . meclizine (ANTIVERT) 25 MG tablet Take 25 mg by mouth 3 (three) times daily as needed for dizziness.      . Multiple Vitamin (MULTIVITAMIN) tablet Take 1 tablet by mouth daily.        . Multiple Vitamins-Minerals (ICAPS) CAPS Take by mouth.      . nitroGLYCERIN  (NITROSTAT) 0.4 MG SL tablet Place 1 tablet (0.4 mg total) under the tongue every 5 (five) minutes as needed.  30 tablet  2  . Omega-3 Fatty Acids (FISH OIL) 1000 MG CAPS Take 1 capsule by mouth daily.       . polyethylene glycol powder (MIRALAX) powder Take 1 Container by mouth once.      . saxagliptin HCl (ONGLYZA) 2.5 MG TABS tablet Take 2.5 mg by mouth daily.      . sertraline (ZOLOFT) 50 MG tablet Take 50 mg by mouth daily.       Marland Kitchen spironolactone (ALDACTONE) 25 MG tablet TAKE ONE TABLET BY MOUTH ONE TIME DAILY  90 tablet  0  . trimethoprim (TRIMPEX) 100 MG tablet Take 100 mg by mouth 2 (two) times daily.      Marland Kitchen warfarin (COUMADIN) 2.5 MG tablet TAKE AS DIRECTED BY ANTICOAGULATION CLINIC  30 tablet  2   No current facility-administered medications for this encounter.    BP 136/68  Pulse 67  Resp 20  Ht 4\' 11"  (1.499 m)  Wt 149 lb (67.586 kg)  BMI 30.08 kg/m2  SpO2 95% General:  Elderly woman in no distress.  Neck:  Neck supple, JVP 7 cm. No masses, thyromegaly or abnormal cervical nodes. Lungs:  Slight crackles at bases bilaterally.  Heart:  Non-displaced PMI, chest non-tender; regular rate and rhythm, S1, S2 without rubs or gallops. 2/6 HSM at apex.  Carotid upstroke normal, no bruit.  Pedals normal pulses. No peripheral edema. Abdomen:  Bowel sounds positive; abdomen soft and non-tender without masses, organomegaly, or hernias noted. No hepatosplenomegaly. Extremities:  No clubbing or cyanosis. Neurologic:  Alert and oriented x 3. Psych:  Normal affect.  Assessment/Plan:  1. CAD   No chest pain.  Myoview in 7/13 showed scar without significant ischemia.  Continue statin, ARB, beta blocker. She is on warfarin so she does not need to take ASA 81.  2. HYPERLIPIDEMIA Continue statin, good lipids in 9/14. 3. Systolic CHF, chronic   NYHA class III symptoms. She is limited but this is stable.    - Volume ok, continue current Lasix. - Continue current spironolactone, losartan and  Coreg.  Creatinine has risen with prior attempts to uptitrate losartan.  - Check BMET/BNP today. - Continue to try to do some walking for exercise.   - ICD is nearing the need for a generator change.  We discussed this at length today.  I think she is leaning towards not having a new implant, which is a reasonable choice given her age.  4. VENTRICULAR ANEURYSM, LEFT  Apical aneurysm with thrombus, not seen on last echo. Continue warfarin. No falls but some gait instability. She is  using her cane.  5. Palpitations No atrial fibrillation seen on last holter monitor.  Not as prominent at this time.  6. BPPV This seems to be resolving.   Larey Dresser 07/10/2013

## 2013-07-13 ENCOUNTER — Other Ambulatory Visit: Payer: Self-pay | Admitting: Cardiology

## 2013-07-14 ENCOUNTER — Encounter: Payer: Self-pay | Admitting: Internal Medicine

## 2013-07-26 ENCOUNTER — Ambulatory Visit (INDEPENDENT_AMBULATORY_CARE_PROVIDER_SITE_OTHER): Payer: Medicare Other

## 2013-07-26 ENCOUNTER — Ambulatory Visit: Payer: Medicare Other | Admitting: Rehabilitative and Restorative Service Providers"

## 2013-07-26 DIAGNOSIS — Z5181 Encounter for therapeutic drug level monitoring: Secondary | ICD-10-CM

## 2013-07-26 DIAGNOSIS — Z7901 Long term (current) use of anticoagulants: Secondary | ICD-10-CM

## 2013-07-26 DIAGNOSIS — I253 Aneurysm of heart: Secondary | ICD-10-CM

## 2013-07-26 LAB — POCT INR: INR: 2.2

## 2013-08-04 ENCOUNTER — Ambulatory Visit: Payer: Medicare Other | Admitting: *Deleted

## 2013-08-04 ENCOUNTER — Telehealth: Payer: Self-pay | Admitting: Cardiology

## 2013-08-04 DIAGNOSIS — I472 Ventricular tachycardia, unspecified: Secondary | ICD-10-CM

## 2013-08-04 NOTE — Telephone Encounter (Signed)
Spoke with pt and reminded pt of remote transmission that is due today. Pt verbalized understanding.   

## 2013-08-05 NOTE — Progress Notes (Signed)
Remote ICD transmission.   

## 2013-08-07 ENCOUNTER — Telehealth: Payer: Self-pay | Admitting: Physician Assistant

## 2013-08-07 NOTE — Telephone Encounter (Signed)
The patient called stating her Medtronic ICD ha beeped.  She did a download this past Thursday.  Will have medtronic review.  Sounds like her battery is may be low.    Tarri Fuller

## 2013-08-08 ENCOUNTER — Telehealth: Payer: Self-pay | Admitting: Internal Medicine

## 2013-08-08 NOTE — Telephone Encounter (Signed)
Instructions given to patient for manual transmission.  We will call her after it is received and follow up accordingly.

## 2013-08-08 NOTE — Telephone Encounter (Signed)
New message     Want someone to know that her pacemaker/defib is beeping.  She said the battery is getting low. Please advise

## 2013-08-09 ENCOUNTER — Encounter (INDEPENDENT_AMBULATORY_CARE_PROVIDER_SITE_OTHER): Payer: Self-pay

## 2013-08-09 ENCOUNTER — Encounter: Payer: Self-pay | Admitting: Internal Medicine

## 2013-08-09 ENCOUNTER — Ambulatory Visit (INDEPENDENT_AMBULATORY_CARE_PROVIDER_SITE_OTHER): Payer: Medicare Other | Admitting: Internal Medicine

## 2013-08-09 VITALS — BP 157/79 | HR 68 | Ht 59.0 in | Wt 145.0 lb

## 2013-08-09 DIAGNOSIS — I5022 Chronic systolic (congestive) heart failure: Secondary | ICD-10-CM

## 2013-08-09 DIAGNOSIS — I509 Heart failure, unspecified: Secondary | ICD-10-CM

## 2013-08-09 DIAGNOSIS — I472 Ventricular tachycardia, unspecified: Secondary | ICD-10-CM

## 2013-08-09 DIAGNOSIS — Z95810 Presence of automatic (implantable) cardiac defibrillator: Secondary | ICD-10-CM

## 2013-08-09 DIAGNOSIS — I4729 Other ventricular tachycardia: Secondary | ICD-10-CM

## 2013-08-09 NOTE — Progress Notes (Signed)
HPI [name] returns today for followup of her ICD. She is a pleasant 80 yo woman with an ICM, chronic systolic heart failure, s/p ICD implant. In the interim, he has done well. He denies chest pain or sob. No syncope. She denies any ICD shocks. She has reached ERI. Allergies  Allergen Reactions  . Codeine      Current Outpatient Prescriptions  Medication Sig Dispense Refill  . acetaminophen (TYLENOL) 325 MG tablet Take 325 mg by mouth every 6 (six) hours as needed.      Marland Kitchen atorvastatin (LIPITOR) 40 MG tablet TAKE 1 TABLET (40 MG TOTAL) BY MOUTH EVERY EVENING.  30 tablet  6  . carvedilol (COREG) 25 MG tablet TAKE ONE TABLET BY MOUTH TWICE DAILY  180 tablet  1  . cholecalciferol (VITAMIN D) 1000 UNITS tablet Take 1,000 Units by mouth daily.        . furosemide (LASIX) 40 MG tablet Take 1 tablet (40 mg total) by mouth 2 (two) times daily.  180 tablet  1  . Glucosamine 500 MG CAPS Take 500 mg by mouth daily.       Marland Kitchen losartan (COZAAR) 25 MG tablet Take 1 tablet (25 mg total) by mouth daily.  90 tablet  3  . meclizine (ANTIVERT) 25 MG tablet Take 25 mg by mouth 3 (three) times daily as needed for dizziness.      . Multiple Vitamin (MULTIVITAMIN) tablet Take 1 tablet by mouth daily.        . Multiple Vitamins-Minerals (ICAPS) CAPS Take by mouth.      . nitroGLYCERIN (NITROSTAT) 0.4 MG SL tablet Place 1 tablet (0.4 mg total) under the tongue every 5 (five) minutes as needed.  30 tablet  2  . Omega-3 Fatty Acids (FISH OIL) 1000 MG CAPS Take 1 capsule by mouth daily.       . polyethylene glycol powder (MIRALAX) powder Take 1 Container by mouth once.      . saxagliptin HCl (ONGLYZA) 2.5 MG TABS tablet Take 2.5 mg by mouth daily.      . sertraline (ZOLOFT) 50 MG tablet Take 50 mg by mouth daily.       Marland Kitchen spironolactone (ALDACTONE) 25 MG tablet TAKE ONE TABLET BY MOUTH ONE TIME DAILY  90 tablet  0  . trimethoprim (TRIMPEX) 100 MG tablet Take 100 mg by mouth 2 (two) times daily.      Marland Kitchen  warfarin (COUMADIN) 2.5 MG tablet TAKE AS DIRECTED BY ANTICOAGULATION CLINIC  30 tablet  3   No current facility-administered medications for this visit.     Past Medical History  Diagnosis Date  . Pure hypercholesterolemia   . Arthritis     severe lower back pain  . Other specified forms of chronic ischemic heart disease     echo (1/10) with EF 25-30%, mildly dilated LV, periapical LV aneueysm, calcified LV thombus, moderate MR. Medtronic ICD set to VVI.   Marland Kitchen CAD (coronary artery disease)     anterior MI in 2006 with Cypher DES to LAD  . HTN (hypertension)   . LV (left ventricular) mural thrombus     on coumadin   . Nephrolithiasis   . CHF (congestive heart failure)   . Cardiomyopathy   . OA (osteoarthritis) of hip   . Osteoporosis   . On home oxygen therapy     at night    ROS:   All systems reviewed and negative except as noted in the  HPI.   Past Surgical History  Procedure Laterality Date  . Aicd implantation      ICD-Medtronic. Remote-yes   . Coronary angioplasty with stent placement    . Coronary angioplasty    . Cardiac catheterization    . Vesicovaginal fistula closure w/ tah  1978     Family History  Problem Relation Age of Onset  . Breast cancer Mother   . Stroke Father   . Stroke Brother   . Diabetes Brother      History   Social History  . Marital Status: Widowed    Spouse Name: N/A    Number of Children: 3  . Years of Education: 12   Occupational History  .      retired   Social History Main Topics  . Smoking status: Never Smoker   . Smokeless tobacco: Never Used  . Alcohol Use: No  . Drug Use: No  . Sexual Activity: Not on file   Other Topics Concern  . Not on file   Social History Narrative   Pt is widowed and lives alone near [place]. 3 daughters live nearby   Retired   Education high school    Right handed   Caffeine one cup of coffee daily              BP 157/79  Pulse 68  Ht 4\' 11"  (1.499 m)  Wt 145 lb (65.772  kg)  BMI 29.27 kg/m2  Physical Exam:  Well appearing 80 yo woman,NAD HEENT: Unremarkable Neck:  No JVD, no thyromegally Back:  No CVA tenderness Lungs:  Clear with no wheezes HEART:  Regular rate rhythm, no murmurs, no rubs, no clicks Abd:  soft, positive bowel sounds, no organomegally, no rebound, no guarding Ext:  2 plus pulses, no edema, no cyanosis, no clubbing Skin:  No rashes no nodules Neuro:  CN II through XII intact, motor grossly intact   DEVICE  Normal device function.  See PaceArt for details. She is at [facility]  Assess/Plan:

## 2013-08-09 NOTE — Assessment & Plan Note (Signed)
She has had no symptomatic VT in over 4 years. No change in medical therapy.

## 2013-08-09 NOTE — Patient Instructions (Signed)
Keep your follow up with Dr Aundra Dubin and call if you want to have battery changed out

## 2013-08-09 NOTE — Assessment & Plan Note (Signed)
Her Medtronic device has reached ERI. Because of her advanced age, multiple comorbidities and because she has never had a device therapy, I have recommended that she not undergo device generator change. If she changes her mind (she does not want the device changed out either), then I would be willing to do her change out.

## 2013-08-09 NOTE — Assessment & Plan Note (Signed)
Her symptoms are stable. She will followup with Dr. Aundra Dubin.

## 2013-08-10 ENCOUNTER — Encounter: Payer: Self-pay | Admitting: Internal Medicine

## 2013-08-12 LAB — MDC_IDC_ENUM_SESS_TYPE_INCLINIC
Lead Channel Setting Sensing Sensitivity: 0.3 mV
MDC IDC SET LEADCHNL RV PACING AMPLITUDE: 3 V
MDC IDC SET LEADCHNL RV PACING PULSEWIDTH: 0.9 ms
MDC IDC SET ZONE DETECTION INTERVAL: 240 ms
Zone Setting Detection Interval: 300 ms
Zone Setting Detection Interval: 350 ms

## 2013-08-12 NOTE — Progress Notes (Signed)
Transmission was not processed. ERI reached 5-15. Appt made with GT for 5-19.

## 2013-08-18 ENCOUNTER — Other Ambulatory Visit: Payer: Self-pay | Admitting: Cardiology

## 2013-08-20 ENCOUNTER — Other Ambulatory Visit: Payer: Self-pay | Admitting: Cardiology

## 2013-08-23 ENCOUNTER — Ambulatory Visit (INDEPENDENT_AMBULATORY_CARE_PROVIDER_SITE_OTHER): Payer: Medicare Other

## 2013-08-23 ENCOUNTER — Other Ambulatory Visit: Payer: Self-pay | Admitting: Internal Medicine

## 2013-08-23 DIAGNOSIS — Z7901 Long term (current) use of anticoagulants: Secondary | ICD-10-CM

## 2013-08-23 DIAGNOSIS — Z5181 Encounter for therapeutic drug level monitoring: Secondary | ICD-10-CM

## 2013-08-23 DIAGNOSIS — I253 Aneurysm of heart: Secondary | ICD-10-CM

## 2013-08-23 LAB — POCT INR: INR: 2.9

## 2013-10-04 ENCOUNTER — Ambulatory Visit (INDEPENDENT_AMBULATORY_CARE_PROVIDER_SITE_OTHER): Payer: Medicare Other | Admitting: Pharmacist

## 2013-10-04 DIAGNOSIS — I253 Aneurysm of heart: Secondary | ICD-10-CM

## 2013-10-04 DIAGNOSIS — Z7901 Long term (current) use of anticoagulants: Secondary | ICD-10-CM

## 2013-10-04 DIAGNOSIS — Z5181 Encounter for therapeutic drug level monitoring: Secondary | ICD-10-CM

## 2013-10-04 LAB — POCT INR: INR: 3

## 2013-11-02 ENCOUNTER — Encounter: Payer: Self-pay | Admitting: Cardiology

## 2013-11-04 ENCOUNTER — Encounter (HOSPITAL_COMMUNITY): Payer: Self-pay | Admitting: Cardiology

## 2013-11-04 NOTE — Progress Notes (Signed)
Normal lab letter mailed.

## 2013-11-10 ENCOUNTER — Ambulatory Visit (HOSPITAL_COMMUNITY)
Admission: RE | Admit: 2013-11-10 | Discharge: 2013-11-10 | Disposition: A | Discharge status: Home / Self Care | Payer: Medicare Other | Source: Ambulatory Visit | Attending: Internal Medicine | Admitting: Internal Medicine

## 2013-11-10 ENCOUNTER — Encounter (HOSPITAL_COMMUNITY): Payer: Self-pay

## 2013-11-10 ENCOUNTER — Ambulatory Visit (INDEPENDENT_AMBULATORY_CARE_PROVIDER_SITE_OTHER): Payer: Medicare Other | Admitting: Pharmacist

## 2013-11-10 VITALS — BP 140/68 | HR 70 | Wt 144.1 lb

## 2013-11-10 DIAGNOSIS — I509 Heart failure, unspecified: Secondary | ICD-10-CM | POA: Diagnosis not present

## 2013-11-10 DIAGNOSIS — Z885 Allergy status to narcotic agent status: Secondary | ICD-10-CM | POA: Insufficient documentation

## 2013-11-10 DIAGNOSIS — I129 Hypertensive chronic kidney disease with stage 1 through stage 4 chronic kidney disease, or unspecified chronic kidney disease: Secondary | ICD-10-CM | POA: Diagnosis not present

## 2013-11-10 DIAGNOSIS — M545 Low back pain, unspecified: Secondary | ICD-10-CM | POA: Insufficient documentation

## 2013-11-10 DIAGNOSIS — E7800 Pure hypercholesterolemia, unspecified: Secondary | ICD-10-CM | POA: Insufficient documentation

## 2013-11-10 DIAGNOSIS — H8110 Benign paroxysmal vertigo, unspecified ear: Secondary | ICD-10-CM | POA: Insufficient documentation

## 2013-11-10 DIAGNOSIS — I253 Aneurysm of heart: Secondary | ICD-10-CM | POA: Insufficient documentation

## 2013-11-10 DIAGNOSIS — M129 Arthropathy, unspecified: Secondary | ICD-10-CM | POA: Diagnosis not present

## 2013-11-10 DIAGNOSIS — I2510 Atherosclerotic heart disease of native coronary artery without angina pectoris: Secondary | ICD-10-CM | POA: Insufficient documentation

## 2013-11-10 DIAGNOSIS — E785 Hyperlipidemia, unspecified: Secondary | ICD-10-CM | POA: Insufficient documentation

## 2013-11-10 DIAGNOSIS — Z7901 Long term (current) use of anticoagulants: Secondary | ICD-10-CM | POA: Diagnosis not present

## 2013-11-10 DIAGNOSIS — I10 Essential (primary) hypertension: Secondary | ICD-10-CM

## 2013-11-10 DIAGNOSIS — I5022 Chronic systolic (congestive) heart failure: Secondary | ICD-10-CM | POA: Diagnosis not present

## 2013-11-10 DIAGNOSIS — Z5181 Encounter for therapeutic drug level monitoring: Secondary | ICD-10-CM

## 2013-11-10 DIAGNOSIS — R002 Palpitations: Secondary | ICD-10-CM | POA: Insufficient documentation

## 2013-11-10 DIAGNOSIS — Z8249 Family history of ischemic heart disease and other diseases of the circulatory system: Secondary | ICD-10-CM | POA: Insufficient documentation

## 2013-11-10 DIAGNOSIS — N189 Chronic kidney disease, unspecified: Secondary | ICD-10-CM | POA: Diagnosis not present

## 2013-11-10 DIAGNOSIS — I2589 Other forms of chronic ischemic heart disease: Secondary | ICD-10-CM | POA: Insufficient documentation

## 2013-11-10 DIAGNOSIS — H3530 Unspecified macular degeneration: Secondary | ICD-10-CM | POA: Insufficient documentation

## 2013-11-10 LAB — BASIC METABOLIC PANEL
ANION GAP: 15 (ref 5–15)
BUN: 52 mg/dL — AB (ref 6–23)
CHLORIDE: 97 meq/L (ref 96–112)
CO2: 28 mEq/L (ref 19–32)
CREATININE: 1.97 mg/dL — AB (ref 0.50–1.10)
Calcium: 10.1 mg/dL (ref 8.4–10.5)
GFR calc Af Amer: 25 mL/min — ABNORMAL LOW (ref >90)
GFR, EST NON AFRICAN AMERICAN: 22 mL/min — AB (ref >90)
Glucose, Bld: 111 mg/dL — ABNORMAL HIGH (ref 70–99)
Potassium: 4.6 mEq/L (ref 3.7–5.3)
Sodium: 140 mEq/L (ref 137–147)

## 2013-11-10 LAB — POCT INR: INR: 5.8

## 2013-11-10 LAB — PRO B NATRIURETIC PEPTIDE: Pro B Natriuretic peptide (BNP): 1595 pg/mL — ABNORMAL HIGH (ref 0–450)

## 2013-11-10 NOTE — Patient Instructions (Signed)
Labs today  Your physician recommends that you schedule a follow-up appointment in: 4 months  Do the following things EVERYDAY: 1) Weigh yourself in the morning before breakfast. Write it down and keep it in a log. 2) Take your medicines as prescribed 3) Eat low salt foods-Limit salt (sodium) to 2000 mg per day.  4) Stay as active as you can everyday 5) Limit all fluids for the day to less than 2 liters 6)   

## 2013-11-11 ENCOUNTER — Telehealth: Payer: Self-pay | Admitting: *Deleted

## 2013-11-11 NOTE — Progress Notes (Signed)
Patient ID: [name], female   DOB: Nov 25, 1925, 80 y.o.   MRN: [id number] PCP: Dr. Maudie Mercury  80 yo with history of CAD, ischemic CMP and apical aneurysm with LV thrombus presents for cardiology followup. Last echo in 7/13 showed EF 25-30% with apical aneurysm.  Lexiscan myoview in 7/13 showed a large fixed inferolateral defect and a large predominantly fixed apical defect.  There was no significant ischemia and she was managed medically.    She is not short of breath walking in her house but is dyspneic walking longer distances or with getting dressed (chronic).  She has been sleeping in a recliner for over a year but this is because of her back. She continues to have back/hip pain from arthritis.  This is very limiting. She has been taking tramadol but it has not helped. She decided against having an ICD generator change.  No chest pain.  Weight is down 5 lbs.   Labs (12/11): K 4.1, creatinine 1.1 Labs (4/12): K 4.6, creatinine 1.2, BUN 43, BNP 271 Labs (6/12): K 4, creatinine 1.3, LDL 66, HDL 44 Labs (10/12): K 4.8, creatinine 1.3 Labs (3/13): K 4.7, creatinine 1.4 Labs (4/13): K 4.6, creatinine 1.22, LDL 71, HDL 45 Labs (7/13): K 4.2, creatinine 1.2 Labs (10/13): K 3.8, creatinine 1.1, BNP 171 Labs (11/13): K 4, creatinine 1.2, LDL 80, HDL 46 Labs (1/14): K 3.9, creatinine 1.2 Labs (2/14): LDL 72, HDL 50 Labs (5/14): K 4, creatinine 1.5, BNP 214 Labs (9/14): K 4.4, creatinine 1.4, BNP 220, LDL 82, HDL 44 Labs (10/14): K 3.7, creatinine 1.5 Labs (1/15): K 4.3, creatinine 1.44, BNP 944 Labs (7/15): creatinine 1.5, LFTs normal, hemoglobin 12.5, LDL 95, HDL 42  Allergies (verified):  1)  ! Codeine  Past Medical History: 1. HYPERCHOLESTEROLEMIA  2. ARTHRITIS: Severe low back pain.  3. ISCHEMIC CARDIOMYOPATHY: Echo (1/10) with EF 25-30%, mildly dilated LV, periapical LV aneurysm, calcified LV thrombus, moderate MR.  Medtronic ICD set to VVI.  Echo (7/12) with EF 25-30%, mid-apical  anteroseptal, apical lateral, apical anterior akinesis and aneurysm of the true apex; no thrombus noted, moderate MR, PA systolic pressure 30 mmHg.  4. CAD: Anterior MI in 2006 with Cypher DES to LAD.  Lexiscan myoview (7/13) with large fixed inferolateral defect and large predominantly fixed apical defect.  There was minimal ischemia so she was managed medically.  5. HYPERTENSION 6. LV thrombus on coumadin.  7. Macular degeneration 8. CKD 9. Palpitations: Holter (9/14) with PACs and PVCs, no atrial fibrillation.  10. BPPV  Social History: The patient lives alone near [place]. 3 daughters live nearby. Widowed Tobacco Use - No.  Alcohol Use - no  Family History:  CAD  ROS:  All systems reviewed and negative except as per HPI.    Current Outpatient Prescriptions  Medication Sig Dispense Refill  . acetaminophen (TYLENOL) 325 MG tablet Take 325 mg by mouth every 6 (six) hours as needed.      Marland Kitchen atorvastatin (LIPITOR) 40 MG tablet TAKE 1 TABLET (40 MG TOTAL) BY MOUTH EVERY EVENING.  30 tablet  6  . carvedilol (COREG) 25 MG tablet TAKE ONE TABLET BY MOUTH TWICE DAILY  180 tablet  0  . cholecalciferol (VITAMIN D) 1000 UNITS tablet Take 1,000 Units by mouth daily.        . furosemide (LASIX) 40 MG tablet TAKE 1 TABLET (40 MG TOTAL) BY MOUTH 2 (TWO) TIMES DAILY.  180 tablet  0  . Glucosamine 500 MG CAPS Take  500 mg by mouth daily.       Marland Kitchen losartan (COZAAR) 25 MG tablet Take 1 tablet (25 mg total) by mouth daily.  90 tablet  3  . meclizine (ANTIVERT) 25 MG tablet Take 25 mg by mouth 3 (three) times daily as needed for dizziness.      . Multiple Vitamin (MULTIVITAMIN) tablet Take 1 tablet by mouth daily.        . Multiple Vitamins-Minerals (ICAPS) CAPS Take by mouth.      Marland Kitchen NITROSTAT 0.4 MG SL tablet DISSOLVE 1 TABLET UNDER THE TONGUE EVERY 5 MINUTES AS NEEDED FOR CHEST PAIN -  25 tablet  1  . polyethylene glycol powder (MIRALAX) powder Take 1 Container by mouth once.      . saxagliptin HCl  (ONGLYZA) 2.5 MG TABS tablet Take 2.5 mg by mouth daily.      . sertraline (ZOLOFT) 50 MG tablet Take 50 mg by mouth daily.       Marland Kitchen spironolactone (ALDACTONE) 25 MG tablet TAKE ONE TABLET BY MOUTH ONE TIME DAILY  90 tablet  2  . traMADol (ULTRAM) 50 MG tablet Take by mouth every 6 (six) hours as needed.      . trimethoprim (TRIMPEX) 100 MG tablet Take 100 mg by mouth 2 (two) times daily.      Marland Kitchen warfarin (COUMADIN) 2.5 MG tablet TAKE AS DIRECTED BY ANTICOAGULATION CLINIC  30 tablet  3  . Omega-3 Fatty Acids (FISH OIL) 1000 MG CAPS Take 1 capsule by mouth daily.        No current facility-administered medications for this encounter.    BP 140/68  Pulse 70  Wt 144 lb 1.9 oz (65.372 kg)  SpO2 91% General:  Elderly woman in no distress.  Neck:  Neck supple, JVP 7 cm. No masses, thyromegaly or abnormal cervical nodes. Lungs:  Slight crackles at bases bilaterally.  Heart:  Non-displaced PMI, chest non-tender; regular rate and rhythm, S1, S2 without rubs or gallops. 2/6 HSM at apex.  Carotid upstroke normal, no bruit.  Pedals normal pulses. No peripheral edema. Abdomen:  Bowel sounds positive; abdomen soft and non-tender without masses, organomegaly, or hernias noted. No hepatosplenomegaly. Extremities:  No clubbing or cyanosis. Neurologic:  Alert and oriented x 3. Psych:  Normal affect.  Assessment/Plan:  1. CAD   No chest pain.  Myoview in 7/13 showed scar without significant ischemia.  Continue statin, ARB, beta blocker. She is on warfarin so she does not need to take ASA 81.  2. HYPERLIPIDEMIA Continue statin, good lipids in 9/14. 3. Systolic CHF, chronic   NYHA class III symptoms. She is limited but this is stable.  She seems more affected by arthritis than CHF.   - Volume ok, continue current Lasix. - Continue current spironolactone, losartan and Coreg.  Creatinine has risen with prior attempts to uptitrate losartan.  - Check BMET/BNP today. - She has decided against changing her ICD  generator but will not have it explanted.  4. VENTRICULAR ANEURYSM, LEFT  History of apical aneurysm with thrombus, not seen on last echo. Continue warfarin. No falls but some gait instability. She is using her cane.  5. Palpitations No atrial fibrillation seen on last holter monitor.  Not as prominent at this time.  6. Hip/low back pain: Arthritis.  Very limiting.  Tramadol not helped.  Avoid NSAIDs.  May need a low dose narcotic.  She will call her PCP.   Loralie Champagne 11/11/2013

## 2013-11-11 NOTE — Addendum Note (Signed)
Encounter addended by: Bonne Dolores, NT on: 11/11/2013 11:09 AM<BR>     Documentation filed: Charges VN

## 2013-11-11 NOTE — Telephone Encounter (Signed)
Notes Recorded by Larey Dresser, MD on 11/11/2013 at 4:59 PM Decrease Lasix to 40 mg daily x 3 days then take Lasix 40 qam/20 qpm with BMET in 10 days.  Pt advised, verbalized understanding.  Will forward to Heart Failure to follow up with patient about repeat lab in 10 days.

## 2013-11-13 ENCOUNTER — Other Ambulatory Visit: Payer: Self-pay | Admitting: Internal Medicine

## 2013-11-13 ENCOUNTER — Other Ambulatory Visit: Payer: Self-pay | Admitting: Cardiology

## 2013-11-16 NOTE — Telephone Encounter (Signed)
Pt scheduled for INR on 11/17/13 BMET order added to this appt

## 2013-11-17 ENCOUNTER — Ambulatory Visit (INDEPENDENT_AMBULATORY_CARE_PROVIDER_SITE_OTHER): Payer: Medicare Other | Admitting: *Deleted

## 2013-11-17 ENCOUNTER — Other Ambulatory Visit (INDEPENDENT_AMBULATORY_CARE_PROVIDER_SITE_OTHER): Payer: Medicare Other

## 2013-11-17 DIAGNOSIS — I509 Heart failure, unspecified: Secondary | ICD-10-CM

## 2013-11-17 DIAGNOSIS — I5022 Chronic systolic (congestive) heart failure: Secondary | ICD-10-CM

## 2013-11-17 DIAGNOSIS — I253 Aneurysm of heart: Secondary | ICD-10-CM

## 2013-11-17 DIAGNOSIS — Z5181 Encounter for therapeutic drug level monitoring: Secondary | ICD-10-CM

## 2013-11-17 DIAGNOSIS — Z7901 Long term (current) use of anticoagulants: Secondary | ICD-10-CM

## 2013-11-17 LAB — BASIC METABOLIC PANEL
BUN: 41 mg/dL — ABNORMAL HIGH (ref 6–23)
CO2: 28 meq/L (ref 19–32)
CREATININE: 1.8 mg/dL — AB (ref 0.4–1.2)
Calcium: 9.2 mg/dL (ref 8.4–10.5)
Chloride: 100 mEq/L (ref 96–112)
GFR: 28.14 mL/min — ABNORMAL LOW (ref >60.00)
Glucose, Bld: 118 mg/dL — ABNORMAL HIGH (ref 70–99)
Potassium: 4.4 mEq/L (ref 3.5–5.1)
Sodium: 137 mEq/L (ref 135–145)

## 2013-11-17 LAB — POCT INR: INR: 2.5

## 2013-11-18 ENCOUNTER — Other Ambulatory Visit: Payer: Self-pay

## 2013-11-18 ENCOUNTER — Telehealth (HOSPITAL_COMMUNITY): Payer: Self-pay | Admitting: Vascular Surgery

## 2013-11-18 MED ORDER — FUROSEMIDE 40 MG PO TABS: ORAL_TABLET | ORAL | Status: DC

## 2013-11-18 MED ORDER — CARVEDILOL 25 MG PO TABS: ORAL_TABLET | ORAL | Status: DC

## 2013-11-18 NOTE — Telephone Encounter (Signed)
Spoke w/pt she is aware of results and will continue current doses

## 2013-11-18 NOTE — Telephone Encounter (Signed)
Pt called Twice  for lab results.. Please advise

## 2013-12-05 ENCOUNTER — Ambulatory Visit (INDEPENDENT_AMBULATORY_CARE_PROVIDER_SITE_OTHER): Payer: Medicare Other | Admitting: *Deleted

## 2013-12-05 DIAGNOSIS — I253 Aneurysm of heart: Secondary | ICD-10-CM

## 2013-12-05 DIAGNOSIS — Z5181 Encounter for therapeutic drug level monitoring: Secondary | ICD-10-CM

## 2013-12-05 DIAGNOSIS — Z7901 Long term (current) use of anticoagulants: Secondary | ICD-10-CM

## 2013-12-05 LAB — POCT INR: INR: 4.4

## 2013-12-20 ENCOUNTER — Ambulatory Visit (INDEPENDENT_AMBULATORY_CARE_PROVIDER_SITE_OTHER): Payer: Medicare Other | Admitting: Pharmacist

## 2013-12-20 DIAGNOSIS — Z5181 Encounter for therapeutic drug level monitoring: Secondary | ICD-10-CM

## 2013-12-20 DIAGNOSIS — Z7901 Long term (current) use of anticoagulants: Secondary | ICD-10-CM

## 2013-12-20 DIAGNOSIS — I253 Aneurysm of heart: Secondary | ICD-10-CM

## 2013-12-20 LAB — POCT INR: INR: 4

## 2014-01-03 ENCOUNTER — Ambulatory Visit (INDEPENDENT_AMBULATORY_CARE_PROVIDER_SITE_OTHER): Payer: Medicare Other | Admitting: Pharmacist

## 2014-01-03 DIAGNOSIS — I513 Intracardiac thrombosis, not elsewhere classified: Secondary | ICD-10-CM

## 2014-01-03 DIAGNOSIS — Z7901 Long term (current) use of anticoagulants: Secondary | ICD-10-CM

## 2014-01-03 DIAGNOSIS — I213 ST elevation (STEMI) myocardial infarction of unspecified site: Secondary | ICD-10-CM

## 2014-01-03 DIAGNOSIS — I253 Aneurysm of heart: Secondary | ICD-10-CM

## 2014-01-03 DIAGNOSIS — Z5181 Encounter for therapeutic drug level monitoring: Secondary | ICD-10-CM

## 2014-01-03 LAB — POCT INR: INR: 1.8

## 2014-01-16 ENCOUNTER — Ambulatory Visit (INDEPENDENT_AMBULATORY_CARE_PROVIDER_SITE_OTHER): Payer: Medicare Other | Admitting: Pharmacist

## 2014-01-16 DIAGNOSIS — Z5181 Encounter for therapeutic drug level monitoring: Secondary | ICD-10-CM

## 2014-01-16 DIAGNOSIS — Z7901 Long term (current) use of anticoagulants: Secondary | ICD-10-CM

## 2014-01-16 DIAGNOSIS — I213 ST elevation (STEMI) myocardial infarction of unspecified site: Secondary | ICD-10-CM

## 2014-01-16 DIAGNOSIS — I513 Intracardiac thrombosis, not elsewhere classified: Secondary | ICD-10-CM

## 2014-01-16 DIAGNOSIS — I253 Aneurysm of heart: Secondary | ICD-10-CM

## 2014-01-16 LAB — POCT INR: INR: 1.8

## 2014-02-06 ENCOUNTER — Ambulatory Visit (INDEPENDENT_AMBULATORY_CARE_PROVIDER_SITE_OTHER): Payer: Medicare Other | Admitting: Pharmacist

## 2014-02-06 DIAGNOSIS — I513 Intracardiac thrombosis, not elsewhere classified: Secondary | ICD-10-CM

## 2014-02-06 DIAGNOSIS — I213 ST elevation (STEMI) myocardial infarction of unspecified site: Secondary | ICD-10-CM

## 2014-02-06 DIAGNOSIS — I253 Aneurysm of heart: Secondary | ICD-10-CM

## 2014-02-06 DIAGNOSIS — Z5181 Encounter for therapeutic drug level monitoring: Secondary | ICD-10-CM

## 2014-02-06 DIAGNOSIS — Z7901 Long term (current) use of anticoagulants: Secondary | ICD-10-CM

## 2014-02-06 LAB — POCT INR: INR: 2.4

## 2014-02-24 ENCOUNTER — Other Ambulatory Visit: Payer: Self-pay | Admitting: Cardiology

## 2014-02-25 NOTE — Telephone Encounter (Signed)
Rx was sent to pharmacy electronically. 

## 2014-03-06 ENCOUNTER — Ambulatory Visit (HOSPITAL_COMMUNITY)
Admission: RE | Admit: 2014-03-06 | Discharge: 2014-03-06 | Disposition: A | Discharge status: Home / Self Care | Payer: Medicare Other | Source: Ambulatory Visit | Attending: Internal Medicine | Admitting: Internal Medicine

## 2014-03-06 ENCOUNTER — Encounter (HOSPITAL_COMMUNITY): Payer: Self-pay

## 2014-03-06 ENCOUNTER — Ambulatory Visit (INDEPENDENT_AMBULATORY_CARE_PROVIDER_SITE_OTHER): Payer: Medicare Other | Admitting: *Deleted

## 2014-03-06 VITALS — BP 136/82 | HR 79 | Wt 140.0 lb

## 2014-03-06 DIAGNOSIS — I253 Aneurysm of heart: Secondary | ICD-10-CM | POA: Insufficient documentation

## 2014-03-06 DIAGNOSIS — I5022 Chronic systolic (congestive) heart failure: Secondary | ICD-10-CM | POA: Insufficient documentation

## 2014-03-06 DIAGNOSIS — Z7901 Long term (current) use of anticoagulants: Secondary | ICD-10-CM | POA: Insufficient documentation

## 2014-03-06 DIAGNOSIS — I129 Hypertensive chronic kidney disease with stage 1 through stage 4 chronic kidney disease, or unspecified chronic kidney disease: Secondary | ICD-10-CM | POA: Insufficient documentation

## 2014-03-06 DIAGNOSIS — Z95810 Presence of automatic (implantable) cardiac defibrillator: Secondary | ICD-10-CM | POA: Insufficient documentation

## 2014-03-06 DIAGNOSIS — I10 Essential (primary) hypertension: Secondary | ICD-10-CM

## 2014-03-06 DIAGNOSIS — Z79899 Other long term (current) drug therapy: Secondary | ICD-10-CM | POA: Insufficient documentation

## 2014-03-06 DIAGNOSIS — Z95818 Presence of other cardiac implants and grafts: Secondary | ICD-10-CM | POA: Insufficient documentation

## 2014-03-06 DIAGNOSIS — I728 Aneurysm of other specified arteries: Secondary | ICD-10-CM | POA: Diagnosis not present

## 2014-03-06 DIAGNOSIS — I213 ST elevation (STEMI) myocardial infarction of unspecified site: Secondary | ICD-10-CM

## 2014-03-06 DIAGNOSIS — N189 Chronic kidney disease, unspecified: Secondary | ICD-10-CM | POA: Diagnosis not present

## 2014-03-06 DIAGNOSIS — E7800 Pure hypercholesterolemia, unspecified: Secondary | ICD-10-CM

## 2014-03-06 DIAGNOSIS — I513 Intracardiac thrombosis, not elsewhere classified: Secondary | ICD-10-CM

## 2014-03-06 DIAGNOSIS — Z5181 Encounter for therapeutic drug level monitoring: Secondary | ICD-10-CM

## 2014-03-06 DIAGNOSIS — I252 Old myocardial infarction: Secondary | ICD-10-CM | POA: Insufficient documentation

## 2014-03-06 DIAGNOSIS — E785 Hyperlipidemia, unspecified: Secondary | ICD-10-CM | POA: Diagnosis not present

## 2014-03-06 DIAGNOSIS — I2510 Atherosclerotic heart disease of native coronary artery without angina pectoris: Secondary | ICD-10-CM | POA: Diagnosis present

## 2014-03-06 DIAGNOSIS — I2584 Coronary atherosclerosis due to calcified coronary lesion: Secondary | ICD-10-CM

## 2014-03-06 DIAGNOSIS — I255 Ischemic cardiomyopathy: Secondary | ICD-10-CM | POA: Diagnosis not present

## 2014-03-06 LAB — COMPREHENSIVE METABOLIC PANEL
ALK PHOS: 61 U/L (ref 39–117)
ALT: 15 U/L (ref 0–35)
ANION GAP: 14 (ref 5–15)
AST: 20 U/L (ref 0–37)
Albumin: 3.7 g/dL (ref 3.5–5.2)
BUN: 41 mg/dL — ABNORMAL HIGH (ref 6–23)
CALCIUM: 9.6 mg/dL (ref 8.4–10.5)
CO2: 24 meq/L (ref 19–32)
Chloride: 106 mEq/L (ref 96–112)
Creatinine, Ser: 1.32 mg/dL — ABNORMAL HIGH (ref 0.50–1.10)
GFR calc Af Amer: 41 mL/min — ABNORMAL LOW (ref >90)
GFR, EST NON AFRICAN AMERICAN: 35 mL/min — AB (ref >90)
Glucose, Bld: 115 mg/dL — ABNORMAL HIGH (ref 70–99)
POTASSIUM: 4.8 meq/L (ref 3.7–5.3)
SODIUM: 144 meq/L (ref 137–147)
TOTAL PROTEIN: 7.2 g/dL (ref 6.0–8.3)
Total Bilirubin: 0.3 mg/dL (ref 0.3–1.2)

## 2014-03-06 LAB — LIPID PANEL
Cholesterol: 141 mg/dL (ref 0–200)
HDL: 44 mg/dL (ref >39)
LDL CALC: 76 mg/dL (ref 0–99)
Total CHOL/HDL Ratio: 3.2 RATIO
Triglycerides: 104 mg/dL (ref <150)
VLDL: 21 mg/dL (ref 0–40)

## 2014-03-06 LAB — POCT INR: INR: 2.1

## 2014-03-06 NOTE — Addendum Note (Signed)
Encounter addended by: Scarlette Calico, RN on: 03/06/2014 10:57 AM<BR>     Documentation filed: Visit Diagnoses, Dx Association, Patient Instructions Section, Orders

## 2014-03-06 NOTE — Progress Notes (Signed)
Patient ID: [name], female   DOB: 1925-07-06, 80 y.o.   MRN: [id number] PCP: Dr. Maudie Mercury  80 yo with history of CAD, ischemic CMP and apical aneurysm with LV thrombus presents for cardiology followup. Last echo in 7/13 showed EF 25-30% with apical aneurysm.  Lexiscan myoview in 7/13 showed a large fixed inferolateral defect and a large predominantly fixed apical defect. EF 27% There was no significant ischemia and she was managed medically.    She is not short of breath walking in her house but DOE with longer distances.  Main issue is ongoing back pain. No edema. She decided against having an ICD generator change.  No chest pain.  Weight is down from 144 to 140 today.  Baseline weight in AM is around 137-138 at home scale, denies extra fluid on board today.    Labs (12/11): K 4.1, creatinine 1.1 Labs (4/12): K 4.6, creatinine 1.2, BUN 43, BNP 271 Labs (6/12): K 4, creatinine 1.3, LDL 66, HDL 44 Labs (10/12): K 4.8, creatinine 1.3 Labs (3/13): K 4.7, creatinine 1.4 Labs (4/13): K 4.6, creatinine 1.22, LDL 71, HDL 45 Labs (7/13): K 4.2, creatinine 1.2 Labs (10/13): K 3.8, creatinine 1.1, BNP 171 Labs (11/13): K 4, creatinine 1.2, LDL 80, HDL 46 Labs (1/14): K 3.9, creatinine 1.2 Labs (2/14): LDL 72, HDL 50 Labs (5/14): K 4, creatinine 1.5, BNP 214 Labs (9/14): K 4.4, creatinine 1.4, BNP 220, LDL 82, HDL 44 Labs (10/14): K 3.7, creatinine 1.5 Labs (1/15): K 4.3, creatinine 1.44, BNP 944 Labs (7/15): creatinine 1.5, LFTs normal, hemoglobin 12.5, LDL 95, HDL 42 Labs (8/27): creatinine 1.8, K+ 4.4,   Allergies (verified):  1)  ! Codeine  Past Medical History: 1. HYPERCHOLESTEROLEMIA  2. ARTHRITIS: Severe low back pain.  3. ISCHEMIC CARDIOMYOPATHY: Echo (1/10) with EF 25-30%, mildly dilated LV, periapical LV aneurysm, calcified LV thrombus, moderate MR.  Medtronic ICD set to VVI.  Echo (7/12) with EF 25-30%, mid-apical anteroseptal, apical lateral, apical anterior akinesis and aneurysm of  the true apex; no thrombus noted, moderate MR, PA systolic pressure 30 mmHg.  4. CAD: Anterior MI in 2006 with Cypher DES to LAD.  Lexiscan myoview (7/13) with large fixed inferolateral defect and large predominantly fixed apical defect.  There was minimal ischemia so she was managed medically.  5. HYPERTENSION 6. LV thrombus on coumadin.  7. Macular degeneration 8. CKD 9. Palpitations: Holter (9/14) with PACs and PVCs, no atrial fibrillation.  10. BPPV  Social History: The patient lives alone near [place]. 3 daughters live nearby. Widowed Tobacco Use - No.  Alcohol Use - no  Family History:  CAD  ROS:  All systems reviewed and negative except as per HPI.    Current Outpatient Prescriptions  Medication Sig Dispense Refill  . acetaminophen (TYLENOL) 325 MG tablet Take 325 mg by mouth every 6 (six) hours as needed.    Marland Kitchen atorvastatin (LIPITOR) 40 MG tablet TAKE 1 TABLET (40 MG TOTAL) BY MOUTH EVERY EVENING. 30 tablet 5  . carvedilol (COREG) 25 MG tablet TAKE ONE TABLET BY MOUTH TWICE DAILY 180 tablet 1  . cholecalciferol (VITAMIN D) 1000 UNITS tablet Take 1,000 Units by mouth daily.      . furosemide (LASIX) 40 MG tablet 1 tablet in the AM and 1/2 in the PM 45 tablet 6  . Glucosamine 500 MG CAPS Take 500 mg by mouth daily.     Marland Kitchen losartan (COZAAR) 25 MG tablet Take 1 tablet (25 mg total) by  mouth daily. 90 tablet 3  . meclizine (ANTIVERT) 25 MG tablet Take 25 mg by mouth 3 (three) times daily as needed for dizziness.    . Multiple Vitamin (MULTIVITAMIN) tablet Take 1 tablet by mouth daily.      . Multiple Vitamins-Minerals (ICAPS) CAPS Take by mouth.    Marland Kitchen NITROSTAT 0.4 MG SL tablet DISSOLVE 1 TABLET UNDER THE TONGUE EVERY 5 MINUTES AS NEEDED FOR CHEST PAIN - 25 tablet 1  . Omega-3 Fatty Acids (FISH OIL) 1000 MG CAPS Take 1 capsule by mouth daily.     . polyethylene glycol powder (MIRALAX) powder Take 1 Container by mouth once.    . saxagliptin HCl (ONGLYZA) 2.5 MG TABS tablet Take  2.5 mg by mouth daily.    . sertraline (ZOLOFT) 50 MG tablet Take 50 mg by mouth daily.     Marland Kitchen spironolactone (ALDACTONE) 25 MG tablet TAKE ONE TABLET BY MOUTH ONE TIME DAILY 90 tablet 2  . traMADol (ULTRAM) 50 MG tablet Take by mouth every 6 (six) hours as needed.    . trimethoprim (TRIMPEX) 100 MG tablet Take 100 mg by mouth 2 (two) times daily.    Marland Kitchen warfarin (COUMADIN) 2.5 MG tablet TAKE AS DIRECTED BY ANTICOAGULATION CLINIC 30 tablet 3   No current facility-administered medications for this encounter.    BP 136/82 mmHg  Pulse 79  Wt 63.504 kg (140 lb)  SpO2 92% General:  Elderly woman in no distress.  Neck:  Neck supple, JVP 8 cm.  Lungs:  CTAB Heart:  Non-displaced PMI, chest non-tender; regular rate and rhythm, S1, S2 without rubs or gallops. 2/6 HSM at apex.  Carotid upstroke normal, no bruit.   Abdomen:  Soft/NT/ND, NABS, No hepatosplenomegaly. Extremities:  No clubbing or cyanosis.  No edema present  Neurologic:  Alert and oriented x 3. Psych:  Normal affect.  Assessment/Plan:  1. CAD   -No chest pain.  Myoview in 7/13 showed scar without significant ischemia.   -Continue statin, ARB, beta blocker, coumadin -Check BMET today   2. HYPERLIPIDEMIA -Continue statin. -Check Lipid Panel today   3. Systolic CHF, chronic   NYHA class III symptoms, stable  - Volume ok, continue current Lasix. - Continue current spironolactone, losartan and Coreg.  Creatinine has risen with prior attempts to uptitrate losartan.  - Check BMET - She has decided against changing her ICD generator but will not have it explanted - Repeat Echo (last echo from 09/2011)   4. VENTRICULAR ANEURYSM, LEFT  -History of apical aneurysm with thrombus, not seen on last echo. Continue warfarin.   5. HTN -Stable, continue current meds, check BMET today    Kennith Maes 03/06/2014   Patient seen and examined with Dr. Awanda Mink. We discussed all aspects of the encounter. I agree with the assessment and plan as  stated above.   Doing well from cardiac perspective. Will continue current regimen. Unable to titrate Losartan due to renal failure. Check lipids and CMET today. I have ok'd her to try Lyrica for her severe back pain. F/u in 4 months with and echo.   Daniel Bensimhon,MD 10:51 AM

## 2014-03-06 NOTE — Patient Instructions (Signed)
Labs today  We will contact you in 4 months to schedule your next appointment and echocardiogram  

## 2014-03-16 ENCOUNTER — Other Ambulatory Visit: Payer: Self-pay | Admitting: Internal Medicine

## 2014-04-03 ENCOUNTER — Ambulatory Visit (INDEPENDENT_AMBULATORY_CARE_PROVIDER_SITE_OTHER): Payer: Medicare Other | Admitting: Surgery

## 2014-04-03 DIAGNOSIS — I513 Intracardiac thrombosis, not elsewhere classified: Secondary | ICD-10-CM

## 2014-04-03 DIAGNOSIS — Z7901 Long term (current) use of anticoagulants: Secondary | ICD-10-CM

## 2014-04-03 DIAGNOSIS — Z5181 Encounter for therapeutic drug level monitoring: Secondary | ICD-10-CM | POA: Diagnosis not present

## 2014-04-03 DIAGNOSIS — I253 Aneurysm of heart: Secondary | ICD-10-CM | POA: Diagnosis not present

## 2014-04-03 DIAGNOSIS — I213 ST elevation (STEMI) myocardial infarction of unspecified site: Secondary | ICD-10-CM

## 2014-04-03 LAB — POCT INR: INR: 2.1

## 2014-04-11 DIAGNOSIS — R0602 Shortness of breath: Secondary | ICD-10-CM | POA: Diagnosis not present

## 2014-04-11 DIAGNOSIS — I509 Heart failure, unspecified: Secondary | ICD-10-CM | POA: Diagnosis not present

## 2014-04-16 ENCOUNTER — Other Ambulatory Visit: Payer: Self-pay | Admitting: Cardiology

## 2014-04-24 ENCOUNTER — Telehealth: Payer: Self-pay | Admitting: Cardiology

## 2014-04-24 NOTE — Telephone Encounter (Signed)
Called patient to follow up with breathing and weight issue.  States her breathing "is not that bad", and weight is up 2.7lbs from yesterday, slight swelling in feet.  Advised to elevate feet in recliner and take whole furosemide tablet this afternoon (usually takes half).  Will call us tomorrow if not resolved.  Renee Pain

## 2014-04-24 NOTE — Telephone Encounter (Signed)
New Msg      Pt c/o swelling: STAT is pt has developed SOB within 24 hours  1. How long have you been experiencing swelling? Last night   2. Where is the swelling located? Feet and ankles   3.  Are you currently taking a "fluid pill"? Yes   4.  Are you currently SOB? No  5.  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  Please call pt and advise, call on cell phn [phone].

## 2014-04-25 ENCOUNTER — Other Ambulatory Visit: Payer: Self-pay | Admitting: *Deleted

## 2014-04-25 ENCOUNTER — Telehealth: Payer: Self-pay | Admitting: Cardiology

## 2014-04-25 MED ORDER — WARFARIN SODIUM 2.5 MG PO TABS: ORAL_TABLET | ORAL | Status: DC

## 2014-04-25 NOTE — Telephone Encounter (Signed)
New Msg        Stephanie RN from [facility] calling, would like to know pt plan of care in case she has any concerns in the future.    Please return call to [place] at [phone] ext [phone].

## 2014-04-25 NOTE — Telephone Encounter (Signed)
Spoke w/Stephanie, let her know what was done yesterday for pt's wt gain

## 2014-04-25 NOTE — Telephone Encounter (Signed)
Left message to call back  

## 2014-05-11 ENCOUNTER — Other Ambulatory Visit: Payer: Self-pay | Admitting: Cardiology

## 2014-05-12 ENCOUNTER — Other Ambulatory Visit (HOSPITAL_COMMUNITY): Payer: Self-pay | Admitting: Cardiology

## 2014-05-12 DIAGNOSIS — I509 Heart failure, unspecified: Secondary | ICD-10-CM | POA: Diagnosis not present

## 2014-05-12 DIAGNOSIS — R0602 Shortness of breath: Secondary | ICD-10-CM | POA: Diagnosis not present

## 2014-05-12 MED ORDER — SPIRONOLACTONE 25 MG PO TABS: 25.0000 mg | ORAL_TABLET | Freq: Every day | ORAL | Status: DC

## 2014-05-15 ENCOUNTER — Ambulatory Visit (INDEPENDENT_AMBULATORY_CARE_PROVIDER_SITE_OTHER): Payer: Medicare Other

## 2014-05-15 ENCOUNTER — Other Ambulatory Visit: Payer: Self-pay | Admitting: Cardiology

## 2014-05-15 DIAGNOSIS — Z5181 Encounter for therapeutic drug level monitoring: Secondary | ICD-10-CM

## 2014-05-15 DIAGNOSIS — I513 Intracardiac thrombosis, not elsewhere classified: Secondary | ICD-10-CM

## 2014-05-15 DIAGNOSIS — I213 ST elevation (STEMI) myocardial infarction of unspecified site: Secondary | ICD-10-CM | POA: Diagnosis not present

## 2014-05-15 DIAGNOSIS — Z7901 Long term (current) use of anticoagulants: Secondary | ICD-10-CM | POA: Diagnosis not present

## 2014-05-15 DIAGNOSIS — I253 Aneurysm of heart: Secondary | ICD-10-CM | POA: Diagnosis not present

## 2014-05-15 LAB — POCT INR: INR: 2

## 2014-05-26 DIAGNOSIS — H3531 Nonexudative age-related macular degeneration: Secondary | ICD-10-CM | POA: Diagnosis not present

## 2014-06-05 DIAGNOSIS — E118 Type 2 diabetes mellitus with unspecified complications: Secondary | ICD-10-CM | POA: Diagnosis not present

## 2014-06-05 DIAGNOSIS — I10 Essential (primary) hypertension: Secondary | ICD-10-CM | POA: Diagnosis not present

## 2014-06-06 ENCOUNTER — Emergency Department (HOSPITAL_COMMUNITY): Payer: Medicare Other

## 2014-06-06 ENCOUNTER — Emergency Department (HOSPITAL_COMMUNITY)
Admission: EM | Admit: 2014-06-06 | Discharge: 2014-06-06 | Disposition: A | Discharge status: Home / Self Care | Payer: Medicare Other | Attending: Emergency Medicine | Admitting: Emergency Medicine

## 2014-06-06 DIAGNOSIS — I2510 Atherosclerotic heart disease of native coronary artery without angina pectoris: Secondary | ICD-10-CM | POA: Diagnosis not present

## 2014-06-06 DIAGNOSIS — Z9861 Coronary angioplasty status: Secondary | ICD-10-CM | POA: Insufficient documentation

## 2014-06-06 DIAGNOSIS — Y92091 Bathroom in other non-institutional residence as the place of occurrence of the external cause: Secondary | ICD-10-CM | POA: Insufficient documentation

## 2014-06-06 DIAGNOSIS — W010XXA Fall on same level from slipping, tripping and stumbling without subsequent striking against object, initial encounter: Secondary | ICD-10-CM | POA: Insufficient documentation

## 2014-06-06 DIAGNOSIS — Z9981 Dependence on supplemental oxygen: Secondary | ICD-10-CM | POA: Insufficient documentation

## 2014-06-06 DIAGNOSIS — Y9389 Activity, other specified: Secondary | ICD-10-CM | POA: Diagnosis not present

## 2014-06-06 DIAGNOSIS — S4992XA Unspecified injury of left shoulder and upper arm, initial encounter: Secondary | ICD-10-CM | POA: Diagnosis not present

## 2014-06-06 DIAGNOSIS — S40012A Contusion of left shoulder, initial encounter: Secondary | ICD-10-CM | POA: Insufficient documentation

## 2014-06-06 DIAGNOSIS — Z79899 Other long term (current) drug therapy: Secondary | ICD-10-CM | POA: Diagnosis not present

## 2014-06-06 DIAGNOSIS — Z8639 Personal history of other endocrine, nutritional and metabolic disease: Secondary | ICD-10-CM | POA: Diagnosis not present

## 2014-06-06 DIAGNOSIS — Z87442 Personal history of urinary calculi: Secondary | ICD-10-CM | POA: Diagnosis not present

## 2014-06-06 DIAGNOSIS — Y998 Other external cause status: Secondary | ICD-10-CM | POA: Insufficient documentation

## 2014-06-06 DIAGNOSIS — S20212A Contusion of left front wall of thorax, initial encounter: Secondary | ICD-10-CM | POA: Insufficient documentation

## 2014-06-06 DIAGNOSIS — I10 Essential (primary) hypertension: Secondary | ICD-10-CM | POA: Diagnosis not present

## 2014-06-06 DIAGNOSIS — I509 Heart failure, unspecified: Secondary | ICD-10-CM | POA: Diagnosis not present

## 2014-06-06 DIAGNOSIS — Z9889 Other specified postprocedural states: Secondary | ICD-10-CM | POA: Diagnosis not present

## 2014-06-06 DIAGNOSIS — M25512 Pain in left shoulder: Secondary | ICD-10-CM | POA: Diagnosis not present

## 2014-06-06 DIAGNOSIS — M1990 Unspecified osteoarthritis, unspecified site: Secondary | ICD-10-CM | POA: Insufficient documentation

## 2014-06-06 DIAGNOSIS — W19XXXA Unspecified fall, initial encounter: Secondary | ICD-10-CM

## 2014-06-06 DIAGNOSIS — Z7901 Long term (current) use of anticoagulants: Secondary | ICD-10-CM | POA: Insufficient documentation

## 2014-06-06 DIAGNOSIS — S2020XA Contusion of thorax, unspecified, initial encounter: Secondary | ICD-10-CM | POA: Diagnosis not present

## 2014-06-06 LAB — PROTIME-INR
INR: 1.59 — ABNORMAL HIGH (ref 0.00–1.49)
Prothrombin Time: 19.1 seconds — ABNORMAL HIGH (ref 11.6–15.2)

## 2014-06-06 NOTE — Discharge Instructions (Signed)
Take an extra 1/2 of a coumadin pill.   Take tylenol for pain.   Follow up if not improving.

## 2014-06-06 NOTE — ED Notes (Signed)
Lab at bedside

## 2014-06-06 NOTE — ED Notes (Signed)
Clarification: patient did not hit head

## 2014-06-06 NOTE — ED Notes (Signed)
Fall at home.  Did hit head but did injury left shoulder.  Rates pain 10.

## 2014-06-06 NOTE — ED Provider Notes (Signed)
CSN: [id number]     Arrival date & time 06/06/14  1531 History  This chart was scribed for Milton Ferguson, MD by Chester Holstein, ED Scribe. This patient was seen in room APA19/APA19 and the patient's care was started at 5:37 PM.      Chief Complaint  Patient presents with  . Fall    Patient is a 81 y.o. female presenting with fall. The history is provided by the patient. No language interpreter was used.  Fall This is a new problem. The current episode started yesterday. The problem occurs rarely. The problem has been resolved. Pertinent negatives include no chest pain, no abdominal pain and no headaches.   HPI Comments: [name] is a 81 y.o. female with PMHx of CAD, cardiomyopathy, OA, osteoporosis, CHF, LV mural thrombus, HCE, and HTN who presents to the Emergency Department complaining of fall PTA. Pt states she was putting on her pants in the bathroom, tripped, and fell onto her left side, noting she thinks she fell on her hip, but denies any hip pain. Pt able to ambulate. Pt notes associated bruising to left chest and left shoulder pain. Pt has a ICD which she states is turned off.  Pt is on blood thinners.  Pt denies head injury, back pain, and LOC.   Past Medical History  Diagnosis Date  . Pure hypercholesterolemia   . Arthritis     severe lower back pain  . Other specified forms of chronic ischemic heart disease     echo (1/10) with EF 25-30%, mildly dilated LV, periapical LV aneueysm, calcified LV thombus, moderate MR. Medtronic ICD set to VVI.   Marland Kitchen CAD (coronary artery disease)     anterior MI in 2006 with Cypher DES to LAD  . HTN (hypertension)   . LV (left ventricular) mural thrombus     on coumadin   . Nephrolithiasis   . CHF (congestive heart failure)   . Cardiomyopathy   . OA (osteoarthritis) of hip   . Osteoporosis   . On home oxygen therapy     at night   Past Surgical History  Procedure Laterality Date  . Aicd implantation      ICD-Medtronic. Remote-yes    . Coronary angioplasty with stent placement    . Coronary angioplasty    . Cardiac catheterization    . Vesicovaginal fistula closure w/ tah  1978   Family History  Problem Relation Age of Onset  . Breast cancer Mother   . Stroke Father   . Stroke Brother   . Diabetes Brother    History  Substance Use Topics  . Smoking status: Never Smoker   . Smokeless tobacco: Never Used  . Alcohol Use: No   OB History    No data available     Review of Systems  Constitutional: Negative for appetite change and fatigue.  HENT: Negative for congestion, ear discharge and sinus pressure.   Eyes: Negative for discharge.  Respiratory: Negative for cough.   Cardiovascular: Negative for chest pain.  Gastrointestinal: Negative for abdominal pain and diarrhea.  Genitourinary: Negative for frequency and hematuria.  Musculoskeletal: Positive for myalgias and arthralgias. Negative for back pain.  Skin: Negative for rash.  Neurological: Negative for seizures, syncope and headaches.  Psychiatric/Behavioral: Negative for hallucinations.      Allergies  Codeine  Home Medications   Prior to Admission medications   Medication Sig Start Date End Date Taking? Authorizing Provider  acetaminophen (TYLENOL) 325 MG tablet Take 325 mg  by mouth every 6 (six) hours as needed.    Historical Provider, MD  atorvastatin (LIPITOR) 40 MG tablet TAKE 1 TABLET (40 MG TOTAL) BY MOUTH EVERY EVENING. 05/18/14   Jolaine Artist, MD  carvedilol (COREG) 25 MG tablet TAKE ONE TABLET BY MOUTH TWICE DAILY 02/25/14   Larey Dresser, MD  cholecalciferol (VITAMIN D) 1000 UNITS tablet Take 1,000 Units by mouth daily.      Historical Provider, MD  furosemide (LASIX) 40 MG tablet 1 tablet in the AM and 1/2 in the PM 11/18/13   Larey Dresser, MD  gabapentin (NEURONTIN) 100 MG capsule Take 100 mg by mouth 3 (three) times daily.    Historical Provider, MD  Glucosamine 500 MG CAPS Take 500 mg by mouth daily.     Historical  Provider, MD  guaiFENesin (MUCINEX) 600 MG 12 hr tablet Take 600 mg by mouth 2 (two) times daily.    Historical Provider, MD  losartan (COZAAR) 25 MG tablet TAKE 1 TABLET (25 MG TOTAL) BY MOUTH DAILY. 04/17/14   Jolaine Artist, MD  meclizine (ANTIVERT) 25 MG tablet Take 25 mg by mouth 3 (three) times daily as needed for dizziness.    Historical Provider, MD  Multiple Vitamin (MULTIVITAMIN) tablet Take 1 tablet by mouth daily.      Historical Provider, MD  Multiple Vitamins-Minerals (ICAPS) CAPS Take by mouth.    Historical Provider, MD  NITROSTAT 0.4 MG SL tablet DISSOLVE 1 TABLET UNDER THE TONGUE EVERY 5 MINUTES AS NEEDED FOR CHEST PAIN - 08/23/13   Evans Lance, MD  Omega-3 Fatty Acids (FISH OIL) 1000 MG CAPS Take 1 capsule by mouth daily.     Historical Provider, MD  polyethylene glycol powder (MIRALAX) powder Take 1 Container by mouth once.    Historical Provider, MD  saxagliptin HCl (ONGLYZA) 2.5 MG TABS tablet Take 2.5 mg by mouth daily.    Historical Provider, MD  sertraline (ZOLOFT) 50 MG tablet Take 50 mg by mouth daily.     Historical Provider, MD  spironolactone (ALDACTONE) 25 MG tablet TAKE ONE TABLET BY MOUTH ONE TIME DAILY 05/12/14   Jolaine Artist, MD  spironolactone (ALDACTONE) 25 MG tablet Take 1 tablet (25 mg total) by mouth daily. 05/12/14   Jolaine Artist, MD  traMADol (ULTRAM) 50 MG tablet Take by mouth every 6 (six) hours as needed.    Historical Provider, MD  trimethoprim (TRIMPEX) 100 MG tablet Take 100 mg by mouth 2 (two) times daily.    Historical Provider, MD  warfarin (COUMADIN) 2.5 MG tablet TAKE AS DIRECTED BY ANTICOAGULATION CLINIC 04/25/14   Evans Lance, MD   BP 125/55 mmHg  Pulse 73  Temp(Src) 98.1 F (36.7 C) (Oral)  Resp 18  Ht 4\' 11"  (1.499 m)  Wt 142 lb (64.411 kg)  BMI 28.67 kg/m2  SpO2 96% Physical Exam  Constitutional: She is oriented to person, place, and time. She appears well-developed.  HENT:  Head: Normocephalic.  Eyes: Conjunctivae  and EOM are normal. No scleral icterus.  Neck: Neck supple. No tracheal deviation present. No thyromegaly present.  Cardiovascular: Normal rate and regular rhythm.  Exam reveals no gallop and no friction rub.   No murmur heard. Pulmonary/Chest: No stridor. She has no wheezes. She has no rales. She exhibits no tenderness.  Abdominal: She exhibits no distension. There is no tenderness. There is no rebound.  Musculoskeletal: Normal range of motion. She exhibits tenderness. She exhibits no edema.  Left shoulder: She exhibits tenderness.  Lymphadenopathy:    She has no cervical adenopathy.  Neurological: She is oriented to person, place, and time. She exhibits normal muscle tone. Coordination normal.  Skin: Skin is warm. Bruising (over left superior chest) noted. No rash noted. No erythema.  Psychiatric: She has a normal mood and affect. Her behavior is normal.  Nursing note and vitals reviewed.   ED Course  Procedures (including critical care time) DIAGNOSTIC STUDIES: Oxygen Saturation is 96% on room air, normal by my interpretation.    COORDINATION OF CARE: 5:41 PM Discussed treatment plan with patient at beside, the patient agrees with the plan and has no further questions at this time.   Labs Review Labs Reviewed - No data to display  Imaging Review Dg Shoulder Left  06/06/2014   CLINICAL DATA:  Pain following fall  EXAM: LEFT SHOULDER - 2+ VIEW  COMPARISON:  None.  FINDINGS: Frontal and Y scapular images were obtained. There is no demonstrable fracture or dislocation. There is osteoarthritic change in the glenohumeral and acromioclavicular joints. No erosive change or intra-articular calcification. Pacemaker present on the left.  IMPRESSION: Osteoarthritic change.  No fracture or dislocation appreciable.   Electronically Signed   By: Lowella Grip III M.D.   On: 06/06/2014 16:40     EKG Interpretation None      MDM   Final diagnoses:  None    Shoulder and chest  contusion.   Pt to follow up with pcp   The chart was scribed for me under my direct supervision.  I personally performed the history, physical, and medical decision making and all procedures in the evaluation of this patient.Milton Ferguson, MD 06/06/14 2018

## 2014-06-07 ENCOUNTER — Telehealth: Payer: Self-pay | Admitting: *Deleted

## 2014-06-07 NOTE — Telephone Encounter (Signed)
Patient called to inform [facility] that she fell on 06/06/14 and went to the ER. She states she was not injured and her INR was 1.6 per the physician.  She stated she was instructed to take 1 tablet everyday until we see her on Monday.  I confirmed her dose and advised her since they have given her extra 1/2 doses for two days on 06/06/14 and 06/07/14 (today) to resume her normal dose tomorrow and we will see her on Monday.  She verbalized understanding and appointment was set for Monday. Hemphill, Lauralyn Primes, RN

## 2014-06-10 DIAGNOSIS — I509 Heart failure, unspecified: Secondary | ICD-10-CM | POA: Diagnosis not present

## 2014-06-10 DIAGNOSIS — R0602 Shortness of breath: Secondary | ICD-10-CM | POA: Diagnosis not present

## 2014-06-12 ENCOUNTER — Ambulatory Visit (INDEPENDENT_AMBULATORY_CARE_PROVIDER_SITE_OTHER): Payer: Medicare Other | Admitting: *Deleted

## 2014-06-12 DIAGNOSIS — I213 ST elevation (STEMI) myocardial infarction of unspecified site: Secondary | ICD-10-CM | POA: Diagnosis not present

## 2014-06-12 DIAGNOSIS — Z5181 Encounter for therapeutic drug level monitoring: Secondary | ICD-10-CM

## 2014-06-12 DIAGNOSIS — Z7901 Long term (current) use of anticoagulants: Secondary | ICD-10-CM

## 2014-06-12 DIAGNOSIS — I513 Intracardiac thrombosis, not elsewhere classified: Secondary | ICD-10-CM

## 2014-06-12 DIAGNOSIS — I253 Aneurysm of heart: Secondary | ICD-10-CM

## 2014-06-12 LAB — POCT INR: INR: 1.9

## 2014-06-29 ENCOUNTER — Other Ambulatory Visit: Payer: Self-pay | Admitting: Cardiology

## 2014-06-29 ENCOUNTER — Ambulatory Visit (INDEPENDENT_AMBULATORY_CARE_PROVIDER_SITE_OTHER): Payer: Medicare Other | Admitting: *Deleted

## 2014-06-29 DIAGNOSIS — M549 Dorsalgia, unspecified: Secondary | ICD-10-CM | POA: Diagnosis not present

## 2014-06-29 DIAGNOSIS — Z5181 Encounter for therapeutic drug level monitoring: Secondary | ICD-10-CM

## 2014-06-29 DIAGNOSIS — I253 Aneurysm of heart: Secondary | ICD-10-CM | POA: Diagnosis not present

## 2014-06-29 DIAGNOSIS — I213 ST elevation (STEMI) myocardial infarction of unspecified site: Secondary | ICD-10-CM

## 2014-06-29 DIAGNOSIS — I513 Intracardiac thrombosis, not elsewhere classified: Secondary | ICD-10-CM

## 2014-06-29 DIAGNOSIS — Z7901 Long term (current) use of anticoagulants: Secondary | ICD-10-CM

## 2014-06-29 DIAGNOSIS — M1611 Unilateral primary osteoarthritis, right hip: Secondary | ICD-10-CM | POA: Diagnosis not present

## 2014-06-29 DIAGNOSIS — M47816 Spondylosis without myelopathy or radiculopathy, lumbar region: Secondary | ICD-10-CM | POA: Diagnosis not present

## 2014-06-29 DIAGNOSIS — M25551 Pain in right hip: Secondary | ICD-10-CM | POA: Diagnosis not present

## 2014-06-29 DIAGNOSIS — D649 Anemia, unspecified: Secondary | ICD-10-CM | POA: Diagnosis not present

## 2014-06-29 DIAGNOSIS — I5022 Chronic systolic (congestive) heart failure: Secondary | ICD-10-CM

## 2014-06-29 DIAGNOSIS — E119 Type 2 diabetes mellitus without complications: Secondary | ICD-10-CM | POA: Diagnosis not present

## 2014-06-29 LAB — POCT INR: INR: 2.4

## 2014-07-11 ENCOUNTER — Ambulatory Visit (INDEPENDENT_AMBULATORY_CARE_PROVIDER_SITE_OTHER): Payer: Medicare Other | Admitting: Pharmacist Clinician (PhC)/ Clinical Pharmacy Specialist

## 2014-07-11 DIAGNOSIS — I253 Aneurysm of heart: Secondary | ICD-10-CM

## 2014-07-11 DIAGNOSIS — I513 Intracardiac thrombosis, not elsewhere classified: Secondary | ICD-10-CM

## 2014-07-11 DIAGNOSIS — Z7901 Long term (current) use of anticoagulants: Secondary | ICD-10-CM | POA: Diagnosis not present

## 2014-07-11 DIAGNOSIS — I213 ST elevation (STEMI) myocardial infarction of unspecified site: Secondary | ICD-10-CM

## 2014-07-11 DIAGNOSIS — R0602 Shortness of breath: Secondary | ICD-10-CM | POA: Diagnosis not present

## 2014-07-11 DIAGNOSIS — Z5181 Encounter for therapeutic drug level monitoring: Secondary | ICD-10-CM | POA: Diagnosis not present

## 2014-07-11 DIAGNOSIS — I509 Heart failure, unspecified: Secondary | ICD-10-CM | POA: Diagnosis not present

## 2014-07-11 LAB — POCT INR: INR: 2.8

## 2014-07-19 ENCOUNTER — Ambulatory Visit (HOSPITAL_BASED_OUTPATIENT_CLINIC_OR_DEPARTMENT_OTHER)
Admission: RE | Admit: 2014-07-19 | Discharge: 2014-07-19 | Disposition: A | Discharge status: Home / Self Care | Payer: Medicare Other | Source: Ambulatory Visit | Attending: Internal Medicine | Admitting: Internal Medicine

## 2014-07-19 ENCOUNTER — Encounter (HOSPITAL_COMMUNITY): Payer: Self-pay

## 2014-07-19 ENCOUNTER — Ambulatory Visit (HOSPITAL_COMMUNITY)
Admission: RE | Admit: 2014-07-19 | Discharge: 2014-07-19 | Disposition: A | Discharge status: Home / Self Care | Payer: Medicare Other | Source: Ambulatory Visit | Attending: Internal Medicine | Admitting: Internal Medicine

## 2014-07-19 VITALS — BP 142/76 | HR 68 | Wt 150.0 lb

## 2014-07-19 DIAGNOSIS — I513 Intracardiac thrombosis, not elsewhere classified: Secondary | ICD-10-CM

## 2014-07-19 DIAGNOSIS — I2510 Atherosclerotic heart disease of native coronary artery without angina pectoris: Secondary | ICD-10-CM

## 2014-07-19 DIAGNOSIS — I2584 Coronary atherosclerosis due to calcified coronary lesion: Secondary | ICD-10-CM

## 2014-07-19 DIAGNOSIS — I5022 Chronic systolic (congestive) heart failure: Secondary | ICD-10-CM

## 2014-07-19 DIAGNOSIS — I509 Heart failure, unspecified: Secondary | ICD-10-CM | POA: Diagnosis not present

## 2014-07-19 DIAGNOSIS — I213 ST elevation (STEMI) myocardial infarction of unspecified site: Secondary | ICD-10-CM

## 2014-07-19 MED ORDER — SODIUM CHLORIDE 0.9 % IV SOLN
INTRAVENOUS | Status: DC
  Administered 2014-07-19: 15:00:00 via INTRAVENOUS

## 2014-07-19 MED ORDER — PERFLUTREN LIPID MICROSPHERE
INTRAVENOUS | Status: AC
  Filled 2014-07-19: qty 10

## 2014-07-19 MED ORDER — PERFLUTREN LIPID MICROSPHERE
1.0000 mL | INTRAVENOUS | Status: AC | PRN
  Administered 2014-07-19: 2.2 mL via INTRAVENOUS

## 2014-07-19 NOTE — Progress Notes (Signed)
  Echocardiogram 2D Echocardiogram has been performed.  [name] 07/19/2014, 3:49 PM

## 2014-07-19 NOTE — Patient Instructions (Signed)
INCREASE Lasix to 40 mg twice a day for 2 days and then resume Lasix 40 mg in the AM and 20 mg in the PM  Your physician recommends that you schedule a follow-up appointment in: 6 months  Do the following things EVERYDAY: 1) Weigh yourself in the morning before breakfast. Write it down and keep it in a log. 2) Take your medicines as prescribed 3) Eat low salt foods-Limit salt (sodium) to 2000 mg per day.  4) Stay as active as you can everyday 5) Limit all fluids for the day to less than 2 liters 6)

## 2014-07-19 NOTE — Progress Notes (Signed)
Patient ID: [name], female   DOB: [birth year], 81 y.o.   MRN: [id number] PCP: Dr. Maudie Mercury  81 yo with history of CAD, ischemic CMP and apical aneurysm with LV thrombus presents for cardiology followup. Last echo in 7/13 showed EF 25-30% with apical aneurysm.  Lexiscan myoview in 7/13 showed a large fixed inferolateral defect and a large predominantly fixed apical defect. EF 27% There was no significant ischemia and she was managed medically.    Feels ok. Had echo today EF 35-30% with akinesis of distal half of LV. No apical clot. RV ok. She is not short of breath walking in her house but DOE with longer distances.  Her ankles have been swelling for 2-3 weeks. Has gained 8-10 pounds. She decided against having an ICD generator change.  No signifcant chest pain.  No orthopnea or PND. Eating at [facility] regularly.  Labs (12/11): K 4.1, creatinine 1.1 Labs (4/12): K 4.6, creatinine 1.2, BUN 43, BNP 271 Labs (6/12): K 4, creatinine 1.3, LDL 66, HDL 44 Labs (10/12): K 4.8, creatinine 1.3 Labs (3/13): K 4.7, creatinine 1.4 Labs (4/13): K 4.6, creatinine 1.22, LDL 71, HDL 45 Labs (7/13): K 4.2, creatinine 1.2 Labs (10/13): K 3.8, creatinine 1.1, BNP 171 Labs (11/13): K 4, creatinine 1.2, LDL 80, HDL 46 Labs (1/14): K 3.9, creatinine 1.2 Labs (2/14): LDL 72, HDL 50 Labs (5/14): K 4, creatinine 1.5, BNP 214 Labs (9/14): K 4.4, creatinine 1.4, BNP 220, LDL 82, HDL 44 Labs (10/14): K 3.7, creatinine 1.5 Labs (1/15): K 4.3, creatinine 1.44, BNP 944 Labs (7/15): creatinine 1.5, LFTs normal, hemoglobin 12.5, LDL 95, HDL 42 Labs (11/17/13): creatinine 1.8, K+ 4.4,  Labs (12/15): K+ 4.8 creatinine 1.32 LDL 76 HDL 44  Allergies (verified):  1)  ! Codeine  Past Medical History: 1. HYPERCHOLESTEROLEMIA  2. ARTHRITIS: Severe low back pain.  3. ISCHEMIC CARDIOMYOPATHY: Echo (1/10) with EF 25-30%, mildly dilated LV, periapical LV aneurysm, calcified LV thrombus, moderate MR.  Medtronic ICD set to VVI.  Echo  (7/12) with EF 25-30%, mid-apical anteroseptal, apical lateral, apical anterior akinesis and aneurysm of the true apex; no thrombus noted, moderate MR, PA systolic pressure 30 mmHg.  4. CAD: Anterior MI in 2006 with Cypher DES to LAD.  Lexiscan myoview (7/13) with large fixed inferolateral defect and large predominantly fixed apical defect.  There was minimal ischemia so she was managed medically.  5. HYPERTENSION 6. LV thrombus on coumadin.  7. Macular degeneration 8. CKD 9. Palpitations: Holter (9/14) with PACs and PVCs, no atrial fibrillation.  10. BPPV  Social History: The patient lives alone near [place]. 3 daughters live nearby. Widowed Tobacco Use - No.  Alcohol Use - no  Family History:  CAD  ROS:  All systems reviewed and negative except as per HPI.    Current Outpatient Prescriptions  Medication Sig Dispense Refill  . acetaminophen (TYLENOL) 325 MG tablet Take 325-650 mg by mouth every 6 (six) hours as needed.     Marland Kitchen atorvastatin (LIPITOR) 40 MG tablet TAKE 1 TABLET (40 MG TOTAL) BY MOUTH EVERY EVENING. 30 tablet 4  . carvedilol (COREG) 25 MG tablet TAKE ONE TABLET BY MOUTH TWICE DAILY 180 tablet 1  . cholecalciferol (VITAMIN D) 1000 UNITS tablet Take 1,000 Units by mouth daily.      . folic acid (FOLVITE) 1 MG tablet Take 1 mg by mouth daily.    . furosemide (LASIX) 40 MG tablet TAKE 1 TABLET BY MOUTH IN THE MORNING ADN 1/2  TABLET IN THE EVENING 45 tablet 5  . gabapentin (NEURONTIN) 100 MG capsule Take 100 mg by mouth 3 (three) times daily.    Marland Kitchen losartan (COZAAR) 25 MG tablet TAKE 1 TABLET (25 MG TOTAL) BY MOUTH DAILY. 90 tablet 2  . meclizine (ANTIVERT) 25 MG tablet Take 25 mg by mouth 3 (three) times daily as needed for dizziness.    . Multiple Vitamin (MULTIVITAMIN) tablet Take 1 tablet by mouth daily.      . Multiple Vitamins-Minerals (ICAPS) CAPS Take 1 capsule by mouth daily.     Marland Kitchen NITROSTAT 0.4 MG SL tablet DISSOLVE 1 TABLET UNDER THE TONGUE EVERY 5 MINUTES AS  NEEDED FOR CHEST PAIN - 25 tablet 1  . Omega-3 Fatty Acids (FISH OIL) 1000 MG CAPS Take 1 capsule by mouth daily.     . sertraline (ZOLOFT) 50 MG tablet Take 50 mg by mouth daily.     Marland Kitchen spironolactone (ALDACTONE) 25 MG tablet TAKE ONE TABLET BY MOUTH ONE TIME DAILY 90 tablet 1  . traMADol (ULTRAM) 50 MG tablet Take 50 mg by mouth every 6 (six) hours as needed.    . trimethoprim (TRIMPEX) 100 MG tablet Take 100 mg by mouth daily.     Marland Kitchen warfarin (COUMADIN) 2.5 MG tablet TAKE AS DIRECTED BY ANTICOAGULATION CLINIC (Patient taking differently: 2.5 mg. Takes one-half tablet three times weekly and takes one whole talblet on all other days) 30 tablet 3  . saxagliptin HCl (ONGLYZA) 2.5 MG TABS tablet Take 2.5 mg by mouth daily.     No current facility-administered medications for this encounter.   Facility-Administered Medications Ordered in Other Encounters  Medication Dose Route Frequency Provider Last Rate Last Dose  . 0.9 %  sodium chloride infusion   Intravenous Continuous Jolaine Artist, MD 50 mL/hr at 07/19/14 1522    . perflutren lipid microspheres (DEFINITY) IV suspension  1-10 mL Intravenous PRN Jolaine Artist, MD   2.2 mL at 07/19/14 1531    BP 142/76 mmHg  Pulse 68  Wt 150 lb (68.04 kg)  SpO2 91% General:  Elderly woman in no distress.  Neck:  Neck supple, JVP 8 cm.  Lungs:  CTAB Heart:  Non-displaced PMI, chest non-tender; regular rate and rhythm, S1, S2 without rubs or gallops. 2/6 SEM at RSB.  Carotid upstroke normal, no bruit.   Abdomen:  Soft/NT/ND, NABS, No hepatosplenomegaly. Extremities:  No clubbing or cyanosis.  1+ ankle edema R>L present  Neurologic:  Alert and oriented x 3. Psych:  Normal affect.  Assessment/Plan:  1. CAD   -No chest pain.  Myoview in 7/13 showed scar without significant ischemia.   -Continue statin, ARB, beta blocker, coumadin -Check BMET today   2. HYPERLIPIDEMIA -Continue statin. -Check Lipid Panel today   3. Systolic CHF, chronic    NYHA class III symptoms, stable I reviewed eco today and EF stable at 25-30%. No apical clot.  - Volume status slightly elevated. Will increase lasix to 40 bid for next 2 days. Then back to 40/20. Reinforced need for daily weights and reviewed use of sliding scale diuretics. - Continue current spironolactone, losartan and Coreg.  Creatinine has risen with prior attempts to uptitrate losartan.  - She has decided against changing her ICD generator but will not have it explanted   4. VENTRICULAR ANEURYSM, LEFT  -History of apical aneurysm with thrombus, not seen on echo. Continue warfarin.   5. HTN -Stable, continue current meds.   Glori Bickers MD 07/19/2014

## 2014-07-19 NOTE — Addendum Note (Signed)
Encounter addended by: Kerry Dory, CMA on: 07/19/2014  4:53 PM<BR>     Documentation filed: Patient Instructions Section

## 2014-07-25 ENCOUNTER — Encounter: Payer: Self-pay | Admitting: *Deleted

## 2014-08-04 ENCOUNTER — Emergency Department (HOSPITAL_COMMUNITY)
Admission: EM | Admit: 2014-08-04 | Discharge: 2014-08-04 | Disposition: A | Discharge status: Home / Self Care | Payer: Medicare Other | Attending: Emergency Medicine | Admitting: Emergency Medicine

## 2014-08-04 ENCOUNTER — Emergency Department (HOSPITAL_COMMUNITY): Payer: Medicare Other

## 2014-08-04 ENCOUNTER — Encounter (HOSPITAL_COMMUNITY): Payer: Self-pay | Admitting: Emergency Medicine

## 2014-08-04 DIAGNOSIS — E780 Pure hypercholesterolemia: Secondary | ICD-10-CM | POA: Insufficient documentation

## 2014-08-04 DIAGNOSIS — Z87440 Personal history of urinary (tract) infections: Secondary | ICD-10-CM | POA: Diagnosis not present

## 2014-08-04 DIAGNOSIS — I509 Heart failure, unspecified: Secondary | ICD-10-CM | POA: Insufficient documentation

## 2014-08-04 DIAGNOSIS — M810 Age-related osteoporosis without current pathological fracture: Secondary | ICD-10-CM | POA: Diagnosis not present

## 2014-08-04 DIAGNOSIS — Z9981 Dependence on supplemental oxygen: Secondary | ICD-10-CM | POA: Diagnosis not present

## 2014-08-04 DIAGNOSIS — Z7901 Long term (current) use of anticoagulants: Secondary | ICD-10-CM | POA: Diagnosis not present

## 2014-08-04 DIAGNOSIS — N390 Urinary tract infection, site not specified: Secondary | ICD-10-CM | POA: Diagnosis not present

## 2014-08-04 DIAGNOSIS — R110 Nausea: Secondary | ICD-10-CM | POA: Insufficient documentation

## 2014-08-04 DIAGNOSIS — I2510 Atherosclerotic heart disease of native coronary artery without angina pectoris: Secondary | ICD-10-CM | POA: Insufficient documentation

## 2014-08-04 DIAGNOSIS — I253 Aneurysm of heart: Secondary | ICD-10-CM | POA: Diagnosis not present

## 2014-08-04 DIAGNOSIS — Z79899 Other long term (current) drug therapy: Secondary | ICD-10-CM | POA: Diagnosis not present

## 2014-08-04 DIAGNOSIS — R030 Elevated blood-pressure reading, without diagnosis of hypertension: Secondary | ICD-10-CM | POA: Diagnosis not present

## 2014-08-04 DIAGNOSIS — Z9861 Coronary angioplasty status: Secondary | ICD-10-CM | POA: Diagnosis not present

## 2014-08-04 DIAGNOSIS — I10 Essential (primary) hypertension: Secondary | ICD-10-CM | POA: Diagnosis not present

## 2014-08-04 DIAGNOSIS — Z87442 Personal history of urinary calculi: Secondary | ICD-10-CM | POA: Insufficient documentation

## 2014-08-04 DIAGNOSIS — M1990 Unspecified osteoarthritis, unspecified site: Secondary | ICD-10-CM | POA: Insufficient documentation

## 2014-08-04 DIAGNOSIS — R531 Weakness: Secondary | ICD-10-CM | POA: Diagnosis not present

## 2014-08-04 DIAGNOSIS — E119 Type 2 diabetes mellitus without complications: Secondary | ICD-10-CM | POA: Diagnosis not present

## 2014-08-04 DIAGNOSIS — Z95810 Presence of automatic (implantable) cardiac defibrillator: Secondary | ICD-10-CM | POA: Diagnosis not present

## 2014-08-04 LAB — CBC WITH DIFFERENTIAL/PLATELET
BASOS ABS: 0 10*3/uL (ref 0.0–0.1)
BASOS PCT: 0 % (ref 0–1)
EOS PCT: 1 % (ref 0–5)
Eosinophils Absolute: 0.1 10*3/uL (ref 0.0–0.7)
HEMATOCRIT: 36.5 % (ref 36.0–46.0)
Hemoglobin: 11.8 g/dL — ABNORMAL LOW (ref 12.0–15.0)
LYMPHS PCT: 27 % (ref 12–46)
Lymphs Abs: 1.7 10*3/uL (ref 0.7–4.0)
MCH: 31 pg (ref 26.0–34.0)
MCHC: 32.3 g/dL (ref 30.0–36.0)
MCV: 95.8 fL (ref 78.0–100.0)
MONOS PCT: 6 % (ref 3–12)
Monocytes Absolute: 0.4 10*3/uL (ref 0.1–1.0)
Neutro Abs: 4.3 10*3/uL (ref 1.7–7.7)
Neutrophils Relative %: 66 % (ref 43–77)
Platelets: 171 10*3/uL (ref 150–400)
RBC: 3.81 MIL/uL — ABNORMAL LOW (ref 3.87–5.11)
RDW: 12.3 % (ref 11.5–15.5)
WBC: 6.5 10*3/uL (ref 4.0–10.5)

## 2014-08-04 LAB — URINE MICROSCOPIC-ADD ON

## 2014-08-04 LAB — COMPREHENSIVE METABOLIC PANEL
ALBUMIN: 3.9 g/dL (ref 3.5–5.0)
ALT: 19 U/L (ref 14–54)
AST: 29 U/L (ref 15–41)
Alkaline Phosphatase: 61 U/L (ref 38–126)
Anion gap: 11 (ref 5–15)
BILIRUBIN TOTAL: 0.8 mg/dL (ref 0.3–1.2)
BUN: 39 mg/dL — ABNORMAL HIGH (ref 6–20)
CO2: 26 mmol/L (ref 22–32)
Calcium: 9.4 mg/dL (ref 8.9–10.3)
Chloride: 102 mmol/L (ref 101–111)
Creatinine, Ser: 1.6 mg/dL — ABNORMAL HIGH (ref 0.44–1.00)
GFR calc Af Amer: 32 mL/min — ABNORMAL LOW (ref >60)
GFR calc non Af Amer: 28 mL/min — ABNORMAL LOW (ref >60)
GLUCOSE: 126 mg/dL — AB (ref 65–99)
Potassium: 4.1 mmol/L (ref 3.5–5.1)
SODIUM: 139 mmol/L (ref 135–145)
Total Protein: 7.6 g/dL (ref 6.5–8.1)

## 2014-08-04 LAB — URINALYSIS, ROUTINE W REFLEX MICROSCOPIC
Bilirubin Urine: NEGATIVE
Glucose, UA: 100 mg/dL — AB
Ketones, ur: NEGATIVE mg/dL
Nitrite: POSITIVE — AB
Protein, ur: 30 mg/dL — AB
SPECIFIC GRAVITY, URINE: 1.01 (ref 1.005–1.030)
Urobilinogen, UA: 2 mg/dL — ABNORMAL HIGH (ref 0.0–1.0)
pH: 6 (ref 5.0–8.0)

## 2014-08-04 LAB — PROTIME-INR
INR: 2.16 — ABNORMAL HIGH (ref 0.00–1.49)
Prothrombin Time: 24.2 seconds — ABNORMAL HIGH (ref 11.6–15.2)

## 2014-08-04 LAB — I-STAT TROPONIN, ED: TROPONIN I, POC: 0.01 ng/mL (ref 0.00–0.08)

## 2014-08-04 MED ORDER — CEPHALEXIN 250 MG PO CAPS
500.0000 mg | ORAL_CAPSULE | Freq: Once | ORAL | Status: AC
  Administered 2014-08-04: 500 mg via ORAL
  Filled 2014-08-04: qty 2

## 2014-08-04 MED ORDER — ONDANSETRON 4 MG PO TBDP: ORAL_TABLET | ORAL | Status: DC

## 2014-08-04 MED ORDER — ONDANSETRON HCL 4 MG/2ML IJ SOLN
4.0000 mg | Freq: Once | INTRAMUSCULAR | Status: AC
  Administered 2014-08-04: 4 mg via INTRAVENOUS
  Filled 2014-08-04: qty 2

## 2014-08-04 MED ORDER — SODIUM CHLORIDE 0.9 % IV BOLUS (SEPSIS)
250.0000 mL | Freq: Once | INTRAVENOUS | Status: AC
  Administered 2014-08-04: 250 mL via INTRAVENOUS

## 2014-08-04 MED ORDER — CEPHALEXIN 500 MG PO CAPS: 500.0000 mg | ORAL_CAPSULE | Freq: Three times a day (TID) | ORAL | Status: DC

## 2014-08-04 NOTE — ED Provider Notes (Signed)
Patient is she seemed evaluated by Dr. Aline Brochure for nausea. Urinalysis is come back showing evidence of urinary tract infection. In addition to prescription for ondansetron given by Dr. Aline Brochure, she is given a prescription for cephalexin. Urine is sent for culture and she is advised to follow-up with her PCP in 10 days to repeat urinalysis.  Delora Fuel, MD 16/10/96 [id number]

## 2014-08-04 NOTE — Discharge Instructions (Signed)
Nausea, Adult Nausea is the feeling that you have an upset stomach or have to vomit. Nausea by itself is not likely a serious concern, but it may be an early sign of more serious medical problems. As nausea gets worse, it can lead to vomiting. If vomiting develops, there is the risk of dehydration.  CAUSES   Viral infections.  Food poisoning.  Medicines.  Pregnancy.  Motion sickness.  Migraine headaches.  Emotional distress.  Severe pain from any source.  Alcohol intoxication. HOME CARE INSTRUCTIONS  Get plenty of rest.  Ask your caregiver about specific rehydration instructions.  Eat small amounts of food and sip liquids more often.  Take all medicines as told by your caregiver. SEEK MEDICAL CARE IF:  You have not improved after 2 days, or you get worse.  You have a headache. SEEK IMMEDIATE MEDICAL CARE IF:   You have a fever.  You faint.  You keep vomiting or have blood in your vomit.  You are extremely weak or dehydrated.  You have dark or bloody stools.  You have severe chest or abdominal pain. MAKE SURE YOU:  Understand these instructions.  Will watch your condition.  Will get help right away if you are not doing well or get worse. Document Released: 04/17/2004 Document Revised: 12/03/2011 Document Reviewed: 11/20/2010 [facility] Patient Information 2015 [place], [place]. This information is not intended to replace advice given to you by your health care provider. Make sure you discuss any questions you have with your health care provider.  Urinary Tract Infection Urinary tract infections (UTIs) can develop anywhere along your urinary tract. Your urinary tract is your body's drainage system for removing wastes and extra water. Your urinary tract includes two kidneys, two ureters, a bladder, and a urethra. Your kidneys are a pair of bean-shaped organs. Each kidney is about the size of your fist. They are located below your ribs, one on each side of your  spine. CAUSES Infections are caused by microbes, which are microscopic organisms, including fungi, viruses, and bacteria. These organisms are so small that they can only be seen through a microscope. Bacteria are the microbes that most commonly cause UTIs. SYMPTOMS  Symptoms of UTIs may vary by age and gender of the patient and by the location of the infection. Symptoms in young women typically include a frequent and intense urge to urinate and a painful, burning feeling in the bladder or urethra during urination. Older women and men are more likely to be tired, shaky, and weak and have muscle aches and abdominal pain. A fever may mean the infection is in your kidneys. Other symptoms of a kidney infection include pain in your back or sides below the ribs, nausea, and vomiting. DIAGNOSIS To diagnose a UTI, your caregiver will ask you about your symptoms. Your caregiver also will ask to provide a urine sample. The urine sample will be tested for bacteria and white blood cells. White blood cells are made by your body to help fight infection. TREATMENT  Typically, UTIs can be treated with medication. Because most UTIs are caused by a bacterial infection, they usually can be treated with the use of antibiotics. The choice of antibiotic and length of treatment depend on your symptoms and the type of bacteria causing your infection. HOME CARE INSTRUCTIONS  If you were prescribed antibiotics, take them exactly as your caregiver instructs you. Finish the medication even if you feel better after you have only taken some of the medication.  Drink enough water and  fluids to keep your urine clear or pale yellow.  Avoid caffeine, tea, and carbonated beverages. They tend to irritate your bladder.  Empty your bladder often. Avoid holding urine for long periods of time.  Empty your bladder before and after sexual intercourse.  After a bowel movement, women should cleanse from front to back. Use each tissue only  once. SEEK MEDICAL CARE IF:   You have back pain.  You develop a fever.  Your symptoms do not begin to resolve within 3 days. SEEK IMMEDIATE MEDICAL CARE IF:   You have severe back pain or lower abdominal pain.  You develop chills.  You have nausea or vomiting.  You have continued burning or discomfort with urination. MAKE SURE YOU:   Understand these instructions.  Will watch your condition.  Will get help right away if you are not doing well or get worse. Document Released: 12/18/2004 Document Revised: 09/09/2011 Document Reviewed: 04/18/2011 [facility] Patient Information 2015 [place], [place]. This information is not intended to replace advice given to you by your health care provider. Make sure you discuss any questions you have with your health care provider.  Cephalexin tablets or capsules What is this medicine? CEPHALEXIN (sef a LEX in) is a cephalosporin antibiotic. It is used to treat certain kinds of bacterial infections It will not work for colds, flu, or other viral infections. This medicine may be used for other purposes; ask your health care provider or pharmacist if you have questions. COMMON BRAND NAME(S): Biocef, Keflex, Keftab What should I tell my health care provider before I take this medicine? They need to know if you have any of these conditions: -kidney disease -stomach or intestine problems, especially colitis -an unusual or allergic reaction to cephalexin, other cephalosporins, penicillins, other antibiotics, medicines, foods, dyes or preservatives -pregnant or trying to get pregnant -breast-feeding How should I use this medicine? Take this medicine by mouth with a full glass of water. Follow the directions on the prescription label. This medicine can be taken with or without food. Take your medicine at regular intervals. Do not take your medicine more often than directed. Take all of your medicine as directed even if you think you are better. Do not  skip doses or stop your medicine early. Talk to your pediatrician regarding the use of this medicine in children. While this drug may be prescribed for selected conditions, precautions do apply. Overdosage: If you think you have taken too much of this medicine contact a poison control center or emergency room at once. NOTE: This medicine is only for you. Do not share this medicine with others. What if I miss a dose? If you miss a dose, take it as soon as you can. If it is almost time for your next dose, take only that dose. Do not take double or extra doses. There should be at least 4 to 6 hours between doses. What may interact with this medicine? -probenecid -some other antibiotics This list may not describe all possible interactions. Give your health care provider a list of all the medicines, herbs, non-prescription drugs, or dietary supplements you use. Also tell them if you smoke, drink alcohol, or use illegal drugs. Some items may interact with your medicine. What should I watch for while using this medicine? Tell your doctor or health care professional if your symptoms do not begin to improve in a few days. Do not treat diarrhea with over the counter products. Contact your doctor if you have diarrhea that lasts more than  2 days or if it is severe and watery. If you have diabetes, you may get a false-positive result for sugar in your urine. Check with your doctor or health care professional. What side effects may I notice from receiving this medicine? Side effects that you should report to your doctor or health care professional as soon as possible: -allergic reactions like skin rash, itching or hives, swelling of the face, lips, or tongue -breathing problems -pain or trouble passing urine -redness, blistering, peeling or loosening of the skin, including inside the mouth -severe or watery diarrhea -unusually weak or tired -yellowing of the eyes, skin Side effects that usually do not  require medical attention (report to your doctor or health care professional if they continue or are bothersome): -gas or heartburn -genital or anal irritation -headache -joint or muscle pain -nausea, vomiting This list may not describe all possible side effects. Call your doctor for medical advice about side effects. You may report side effects to FDA at 1-800-FDA-1088. Where should I keep my medicine? Keep out of the reach of children. Store at room temperature between 59 and 86 degrees F (15 and 30 degrees C). Throw away any unused medicine after the expiration date. NOTE: This sheet is a summary. It may not cover all possible information. If you have questions about this medicine, talk to your doctor, pharmacist, or health care provider.  2015, Elsevier/Gold Standard. (2007-06-14 17:09:13)  Ondansetron tablets What is this medicine? ONDANSETRON (on DAN se tron) is used to treat nausea and vomiting caused by chemotherapy. It is also used to prevent or treat nausea and vomiting after surgery. This medicine may be used for other purposes; ask your health care provider or pharmacist if you have questions. COMMON BRAND NAME(S): Zofran What should I tell my health care provider before I take this medicine? They need to know if you have any of these conditions: -heart disease -history of irregular heartbeat -liver disease -low levels of magnesium or potassium in the blood -an unusual or allergic reaction to ondansetron, granisetron, other medicines, foods, dyes, or preservatives -pregnant or trying to get pregnant -breast-feeding How should I use this medicine? Take this medicine by mouth with a glass of water. Follow the directions on your prescription label. Take your doses at regular intervals. Do not take your medicine more often than directed. Talk to your pediatrician regarding the use of this medicine in children. Special care may be needed. Overdosage: If you think you have taken  too much of this medicine contact a poison control center or emergency room at once. NOTE: This medicine is only for you. Do not share this medicine with others. What if I miss a dose? If you miss a dose, take it as soon as you can. If it is almost time for your next dose, take only that dose. Do not take double or extra doses. What may interact with this medicine? Do not take this medicine with any of the following medications: -apomorphine -certain medicines for fungal infections like fluconazole, itraconazole, ketoconazole, posaconazole, voriconazole -cisapride -dofetilide -dronedarone -pimozide -thioridazine -ziprasidone This medicine may also interact with the following medications: -carbamazepine -certain medicines for depression, anxiety, or psychotic disturbances -fentanyl -linezolid -MAOIs like Carbex, Eldepryl, Marplan, Nardil, and Parnate -methylene blue (injected into a vein) -other medicines that prolong the QT interval (cause an abnormal heart rhythm) -phenytoin -rifampicin -tramadol This list may not describe all possible interactions. Give your health care provider a list of all the medicines, herbs, non-prescription drugs, or  dietary supplements you use. Also tell them if you smoke, drink alcohol, or use illegal drugs. Some items may interact with your medicine. What should I watch for while using this medicine? Check with your doctor or health care professional right away if you have any sign of an allergic reaction. What side effects may I notice from receiving this medicine? Side effects that you should report to your doctor or health care professional as soon as possible: -allergic reactions like skin rash, itching or hives, swelling of the face, lips or tongue -breathing problems -confusion -dizziness -fast or irregular heartbeat -feeling faint or lightheaded, falls -fever and chills -loss of balance or coordination -seizures -sweating -swelling of the  hands or feet -tightness in the chest -tremors -unusually weak or tired Side effects that usually do not require medical attention (report to your doctor or health care professional if they continue or are bothersome): -constipation or diarrhea -headache This list may not describe all possible side effects. Call your doctor for medical advice about side effects. You may report side effects to FDA at 1-800-FDA-1088. Where should I keep my medicine? Keep out of the reach of children. Store between 2 and 30 degrees C (36 and 86 degrees F). Throw away any unused medicine after the expiration date. NOTE: This sheet is a summary. It may not cover all possible information. If you have questions about this medicine, talk to your doctor, pharmacist, or health care provider.  2015, Elsevier/Gold Standard. (2012-12-15 16:27:45)

## 2014-08-04 NOTE — ED Notes (Signed)
Pt stable, ambulatory, daughter at bedside, states understanding of discharge instructions

## 2014-08-04 NOTE — ED Notes (Signed)
Pt reports she went to see PCP this morning, states decreased appetite, nausea, and generalized weakness. Also states diffuse abdominal pain upon arrival to ED. Abdomen soft and non tender to palpation. Bowel sounds present all quadrants.

## 2014-08-04 NOTE — ED Notes (Signed)
Pt here from MD office with c/o nausea and weakness for 4 days

## 2014-08-04 NOTE — ED Provider Notes (Signed)
CSN: [id number]     Arrival date & time 08/04/14  1310 History   First MD Initiated Contact with Patient 08/04/14 1320     Chief Complaint  Patient presents with  . Nausea     (Consider location/radiation/quality/duration/timing/severity/associated sxs/prior Treatment) HPI Comments: 81 y.o. female w hx of CAD s/p PCI, HTN, CHF, home O2 at night, HLP who presents with nausea over the last 2-3 days. She has had some retching but denies any emesis. She denies any diarrhea or fever. She has had decreased appetite. She denies any chest pain or shortness of breath. The duration of her symptoms is constant. The severity is mild. She has not had somewhere symptoms previously. She denies any other associated symptoms. Nothing has relieved her symptoms thus far.  The history is provided by the patient.    Past Medical History  Diagnosis Date  . Pure hypercholesterolemia   . Arthritis     severe lower back pain  . Other specified forms of chronic ischemic heart disease     echo (1/10) with EF 25-30%, mildly dilated LV, periapical LV aneueysm, calcified LV thombus, moderate MR. Medtronic ICD set to VVI.   Marland Kitchen CAD (coronary artery disease)     anterior MI in 2006 with Cypher DES to LAD  . HTN (hypertension)   . LV (left ventricular) mural thrombus     on coumadin   . Nephrolithiasis   . CHF (congestive heart failure)   . Cardiomyopathy   . OA (osteoarthritis) of hip   . Osteoporosis   . On home oxygen therapy     at night   Past Surgical History  Procedure Laterality Date  . Aicd implantation      ICD-Medtronic. Remote-yes   . Coronary angioplasty with stent placement    . Coronary angioplasty    . Cardiac catheterization    . Vesicovaginal fistula closure w/ tah  1978   Family History  Problem Relation Age of Onset  . Breast cancer Mother   . Stroke Father   . Stroke Brother   . Diabetes Brother    History  Substance Use Topics  . Smoking status: Never Smoker   . Smokeless  tobacco: Never Used  . Alcohol Use: No   OB History    No data available     Review of Systems  Constitutional: Negative for fever and fatigue.  HENT: Negative for congestion and drooling.   Eyes: Negative for pain.  Respiratory: Negative for cough and shortness of breath.   Cardiovascular: Negative for chest pain.  Gastrointestinal: Positive for nausea. Negative for vomiting, abdominal pain and diarrhea.  Genitourinary: Negative for dysuria and hematuria.  Musculoskeletal: Negative for back pain, gait problem and neck pain.  Skin: Negative for color change.  Neurological: Negative for dizziness and headaches.  Hematological: Negative for adenopathy.  Psychiatric/Behavioral: Negative for behavioral problems.  All other systems reviewed and are negative.     Allergies  Codeine  Home Medications   Prior to Admission medications   Medication Sig Start Date End Date Taking? Authorizing Provider  acetaminophen (TYLENOL) 325 MG tablet Take 325-650 mg by mouth every 6 (six) hours as needed.     Historical Provider, MD  atorvastatin (LIPITOR) 40 MG tablet TAKE 1 TABLET (40 MG TOTAL) BY MOUTH EVERY EVENING. 05/18/14   Jolaine Artist, MD  carvedilol (COREG) 25 MG tablet TAKE ONE TABLET BY MOUTH TWICE DAILY 02/25/14   Larey Dresser, MD  cholecalciferol (VITAMIN D) 1000 UNITS  tablet Take 1,000 Units by mouth daily.      Historical Provider, MD  folic acid (FOLVITE) 1 MG tablet Take 1 mg by mouth daily.    Historical Provider, MD  furosemide (LASIX) 40 MG tablet TAKE 1 TABLET BY MOUTH IN THE MORNING ADN 1/2 TABLET IN THE EVENING 06/30/14   Jolaine Artist, MD  gabapentin (NEURONTIN) 100 MG capsule Take 100 mg by mouth 3 (three) times daily.    Historical Provider, MD  losartan (COZAAR) 25 MG tablet TAKE 1 TABLET (25 MG TOTAL) BY MOUTH DAILY. 04/17/14   Jolaine Artist, MD  meclizine (ANTIVERT) 25 MG tablet Take 25 mg by mouth 3 (three) times daily as needed for dizziness.     Historical Provider, MD  Multiple Vitamin (MULTIVITAMIN) tablet Take 1 tablet by mouth daily.      Historical Provider, MD  Multiple Vitamins-Minerals (ICAPS) CAPS Take 1 capsule by mouth daily.     Historical Provider, MD  NITROSTAT 0.4 MG SL tablet DISSOLVE 1 TABLET UNDER THE TONGUE EVERY 5 MINUTES AS NEEDED FOR CHEST PAIN - 08/23/13   Evans Lance, MD  Omega-3 Fatty Acids (FISH OIL) 1000 MG CAPS Take 1 capsule by mouth daily.     Historical Provider, MD  saxagliptin HCl (ONGLYZA) 2.5 MG TABS tablet Take 2.5 mg by mouth daily.    Historical Provider, MD  sertraline (ZOLOFT) 50 MG tablet Take 50 mg by mouth daily.     Historical Provider, MD  spironolactone (ALDACTONE) 25 MG tablet TAKE ONE TABLET BY MOUTH ONE TIME DAILY 05/12/14   Jolaine Artist, MD  traMADol (ULTRAM) 50 MG tablet Take 50 mg by mouth every 6 (six) hours as needed.    Historical Provider, MD  trimethoprim (TRIMPEX) 100 MG tablet Take 100 mg by mouth daily.     Historical Provider, MD  warfarin (COUMADIN) 2.5 MG tablet TAKE AS DIRECTED BY ANTICOAGULATION CLINIC Patient taking differently: 2.5 mg. Takes one-half tablet three times weekly and takes one whole talblet on all other days 04/25/14   Evans Lance, MD   BP 147/66 mmHg  Pulse 59  Temp(Src) 98.3 F (36.8 C) (Oral)  Resp 17  Ht 4\' 11"  (1.499 m)  Wt 140 lb (63.504 kg)  BMI 28.26 kg/m2  SpO2 95% Physical Exam  Constitutional: She is oriented to person, place, and time. She appears well-developed and well-nourished.  HENT:  Head: Normocephalic.  Mouth/Throat: Oropharynx is clear and moist. No oropharyngeal exudate.  Eyes: Conjunctivae and EOM are normal. Pupils are equal, round, and reactive to light.  Neck: Normal range of motion. Neck supple.  Cardiovascular: Normal rate, regular rhythm, normal heart sounds and intact distal pulses.  Exam reveals no gallop and no friction rub.   No murmur heard. Pulmonary/Chest: Effort normal and breath sounds normal. No  respiratory distress. She has no wheezes.  Abdominal: Soft. Bowel sounds are normal. There is no tenderness. There is no rebound and no guarding.  Musculoskeletal: Normal range of motion. She exhibits no edema or tenderness.  Neurological: She is alert and oriented to person, place, and time.  Skin: Skin is warm and dry.  Psychiatric: She has a normal mood and affect. Her behavior is normal.  Nursing note and vitals reviewed.   ED Course  Procedures (including critical care time) Labs Review Labs Reviewed  CBC WITH DIFFERENTIAL/PLATELET - Abnormal; Notable for the following:    RBC 3.81 (*)    Hemoglobin 11.8 (*)    All  other components within normal limits  COMPREHENSIVE METABOLIC PANEL - Abnormal; Notable for the following:    Glucose, Bld 126 (*)    BUN 39 (*)    Creatinine, Ser 1.60 (*)    GFR calc non Af Amer 28 (*)    GFR calc Af Amer 32 (*)    All other components within normal limits  PROTIME-INR - Abnormal; Notable for the following:    Prothrombin Time 24.2 (*)    INR 2.16 (*)    All other components within normal limits  URINALYSIS, ROUTINE W REFLEX MICROSCOPIC - Abnormal; Notable for the following:    Glucose, UA 100 (*)    Hgb urine dipstick TRACE (*)    Protein, ur 30 (*)    Urobilinogen, UA 2.0 (*)    Nitrite POSITIVE (*)    Leukocytes, UA MODERATE (*)    All other components within normal limits  URINE MICROSCOPIC-ADD ON - Abnormal; Notable for the following:    Bacteria, UA MANY (*)    Casts HYALINE CASTS (*)    All other components within normal limits  URINE CULTURE  I-STAT TROPOININ, ED    Imaging Review Dg Chest 2 View  08/04/2014   CLINICAL DATA:  Loss of appetite.  Nausea.  Weakness.  EXAM: CHEST  2 VIEW  COMPARISON:  06/06/2014  FINDINGS: Pacemaker/AICD is in place with 1 broken lead as seen previously. Cardiomegaly persists with left ventricular calcified aneurysm. The aorta shows calcification and unfolding. The lungs are clear. The vascularity  is normal. No evidence of heart failure. No effusions. Ordinary degenerative changes affect the spine.  IMPRESSION: No change.  No active disease.  Calcified left ventricular aneurysm.   Electronically Signed   By: Nelson Chimes M.D.   On: 08/04/2014 14:38     EKG Interpretation   Date/Time:  Friday Aug 04 2014 13:13:40 EDT Ventricular Rate:  60 PR Interval:  171 QRS Duration: 118 QT Interval:  473 QTC Calculation: 473 R Axis:   -67 Text Interpretation:  Sinus rhythm LVH with IVCD, LAD and secondary repol  abnrm Inferior infarct, old Anterolateral infarct, age indeterminate No  significant change since last tracing Confirmed by Aniza Shor  MD, Mateja Dier  ([id number]) on 08/04/2014 1:20:06 PM      MDM   Final diagnoses:  Nausea  Urinary tract infection without hematuria, site unspecified    1:40 PM 81 y.o. female w hx of CAD s/p PCI on coumadin, HTN, CHF, home O2 at night, HLP who presents with nausea over the last 2-3 days. She has had some retching but denies any emesis. She denies any diarrhea or fever. She has had decreased appetite. She denies any chest pain or shortness of breath. She states that her stomach feels slightly unsettled but denies any abdominal pain. She has a soft benign abdomen without any tenderness. Vital signs are unremarkable here. We'll get screening labs and imaging. Small bolus and Zofran.  Plan for d/c home. Awaiting UA. Dr.Glick to f/u on UA and treat accordingly w/ plan for d/c home. Return precautions provided.    Pamella Pert, MD 08/05/14 1515

## 2014-08-06 LAB — URINE CULTURE: Colony Count: >=100000

## 2014-08-07 ENCOUNTER — Telehealth (HOSPITAL_COMMUNITY): Payer: Self-pay

## 2014-08-07 NOTE — ED Notes (Signed)
Post ED Visit - Positive Culture Follow-up: Chart Hand-off to ED Flow Manager  Culture assessed and recommendations reviewed by: []  Wes Dulaney, Pharm.D., BCPS []  Heide Guile, Pharm.D., BCPS []  Alycia Rossetti, Pharm.D., BCPS []  Redkey, Pharm.D., BCPS, AAHIVP []  Legrand Como, Pharm .D., BCPS, AAHIVP []  Elicia Lamp, Pharm.D.  Positive urine culture  []  Patient discharged without antimicrobial prescription and treatment is now indicated [x]  Organism is resistant to prescribed ED discharge antimicrobial []  Patient with positive blood cultures  Changes discussed with ED provider: [place] antibiotic prescription stop keflex. If symptomatic give fosfomycin 3gram po x 1. Attempting to contact pt.    Ileene Musa 08/07/2014, 11:48 AM

## 2014-08-07 NOTE — ED Notes (Signed)
Pt returned call. Informed of lab results. States she is doing better. No symptoms of uti. No medication called in for patient at this time.

## 2014-08-08 ENCOUNTER — Ambulatory Visit (INDEPENDENT_AMBULATORY_CARE_PROVIDER_SITE_OTHER): Payer: Medicare Other | Admitting: Surgery

## 2014-08-08 DIAGNOSIS — I213 ST elevation (STEMI) myocardial infarction of unspecified site: Secondary | ICD-10-CM | POA: Diagnosis not present

## 2014-08-08 DIAGNOSIS — I253 Aneurysm of heart: Secondary | ICD-10-CM

## 2014-08-08 DIAGNOSIS — Z5181 Encounter for therapeutic drug level monitoring: Secondary | ICD-10-CM | POA: Diagnosis not present

## 2014-08-08 DIAGNOSIS — Z7901 Long term (current) use of anticoagulants: Secondary | ICD-10-CM | POA: Diagnosis not present

## 2014-08-08 DIAGNOSIS — I513 Intracardiac thrombosis, not elsewhere classified: Secondary | ICD-10-CM

## 2014-08-08 LAB — POCT INR: INR: 2.8

## 2014-08-10 DIAGNOSIS — I509 Heart failure, unspecified: Secondary | ICD-10-CM | POA: Diagnosis not present

## 2014-08-10 DIAGNOSIS — R079 Chest pain, unspecified: Secondary | ICD-10-CM | POA: Diagnosis not present

## 2014-08-10 DIAGNOSIS — R0602 Shortness of breath: Secondary | ICD-10-CM | POA: Diagnosis not present

## 2014-08-22 ENCOUNTER — Other Ambulatory Visit: Payer: Self-pay | Admitting: Cardiology

## 2014-08-24 ENCOUNTER — Telehealth (HOSPITAL_COMMUNITY): Payer: Self-pay | Admitting: Vascular Surgery

## 2014-08-24 DIAGNOSIS — R42 Dizziness and giddiness: Secondary | ICD-10-CM | POA: Diagnosis not present

## 2014-08-24 DIAGNOSIS — E780 Pure hypercholesterolemia: Secondary | ICD-10-CM | POA: Diagnosis not present

## 2014-08-24 DIAGNOSIS — I10 Essential (primary) hypertension: Secondary | ICD-10-CM | POA: Diagnosis not present

## 2014-08-24 NOTE — Telephone Encounter (Signed)
Spoke w/pt, she states she has been having trouble with nausea and dizziness for while now, she was recently seen in the ER and found to have a UTI, she was treated for this but states it has not helped her nausea or dizziness at all.  Pt called and discussed with her PCP Dr Maudie Mercury who is going to get a "brain scan" per pt and wants her to see to r/o cardiac causes.  Pt states wt is stable and her SOB is the same.  Appt sch for Mon

## 2014-08-24 NOTE — Telephone Encounter (Addendum)
Pat from DR. Kim office called pt needs to bee seen ASAP for dizziness .Marland Kitchen Pt was just released from the hospital.. Please advise

## 2014-08-25 ENCOUNTER — Other Ambulatory Visit: Payer: Self-pay | Admitting: Internal Medicine

## 2014-08-25 DIAGNOSIS — R42 Dizziness and giddiness: Secondary | ICD-10-CM

## 2014-08-28 ENCOUNTER — Ambulatory Visit (HOSPITAL_COMMUNITY)
Admission: RE | Admit: 2014-08-28 | Discharge: 2014-08-28 | Disposition: A | Discharge status: Home / Self Care | Payer: Medicare Other | Source: Ambulatory Visit | Attending: Internal Medicine | Admitting: Internal Medicine

## 2014-08-28 VITALS — BP 126/48 | HR 73 | Wt 136.8 lb

## 2014-08-28 DIAGNOSIS — Z7901 Long term (current) use of anticoagulants: Secondary | ICD-10-CM | POA: Diagnosis not present

## 2014-08-28 DIAGNOSIS — R079 Chest pain, unspecified: Secondary | ICD-10-CM | POA: Insufficient documentation

## 2014-08-28 DIAGNOSIS — R109 Unspecified abdominal pain: Secondary | ICD-10-CM | POA: Diagnosis not present

## 2014-08-28 DIAGNOSIS — I5022 Chronic systolic (congestive) heart failure: Secondary | ICD-10-CM | POA: Diagnosis not present

## 2014-08-28 DIAGNOSIS — H3530 Unspecified macular degeneration: Secondary | ICD-10-CM | POA: Insufficient documentation

## 2014-08-28 DIAGNOSIS — I252 Old myocardial infarction: Secondary | ICD-10-CM | POA: Insufficient documentation

## 2014-08-28 DIAGNOSIS — I129 Hypertensive chronic kidney disease with stage 1 through stage 4 chronic kidney disease, or unspecified chronic kidney disease: Secondary | ICD-10-CM | POA: Diagnosis not present

## 2014-08-28 DIAGNOSIS — Z79899 Other long term (current) drug therapy: Secondary | ICD-10-CM | POA: Insufficient documentation

## 2014-08-28 DIAGNOSIS — N189 Chronic kidney disease, unspecified: Secondary | ICD-10-CM | POA: Diagnosis not present

## 2014-08-28 DIAGNOSIS — E785 Hyperlipidemia, unspecified: Secondary | ICD-10-CM | POA: Diagnosis not present

## 2014-08-28 DIAGNOSIS — I2510 Atherosclerotic heart disease of native coronary artery without angina pectoris: Secondary | ICD-10-CM | POA: Insufficient documentation

## 2014-08-28 DIAGNOSIS — I253 Aneurysm of heart: Secondary | ICD-10-CM | POA: Diagnosis not present

## 2014-08-28 DIAGNOSIS — R072 Precordial pain: Secondary | ICD-10-CM | POA: Diagnosis not present

## 2014-08-28 DIAGNOSIS — E780 Pure hypercholesterolemia: Secondary | ICD-10-CM | POA: Diagnosis not present

## 2014-08-28 DIAGNOSIS — I255 Ischemic cardiomyopathy: Secondary | ICD-10-CM | POA: Diagnosis not present

## 2014-08-28 DIAGNOSIS — M1990 Unspecified osteoarthritis, unspecified site: Secondary | ICD-10-CM | POA: Insufficient documentation

## 2014-08-28 DIAGNOSIS — N390 Urinary tract infection, site not specified: Secondary | ICD-10-CM | POA: Diagnosis not present

## 2014-08-28 LAB — URINALYSIS W MICROSCOPIC (NOT AT ARMC)
Bilirubin Urine: NEGATIVE
GLUCOSE, UA: 250 mg/dL — AB
HGB URINE DIPSTICK: NEGATIVE
Ketones, ur: NEGATIVE mg/dL
Nitrite: POSITIVE — AB
PH: 6 (ref 5.0–8.0)
PROTEIN: 30 mg/dL — AB
Specific Gravity, Urine: 1.015 (ref 1.005–1.030)
UROBILINOGEN UA: 4 mg/dL — AB (ref 0.0–1.0)

## 2014-08-28 MED ORDER — PANTOPRAZOLE SODIUM 40 MG PO TBEC: 40.0000 mg | DELAYED_RELEASE_TABLET | Freq: Every day | ORAL | Status: DC

## 2014-08-28 NOTE — Progress Notes (Addendum)
Advanced Heart Failure Medication Review by a Pharmacist  Does the patient  feel that his/her medications are working for him/her?  yes  Has the patient been experiencing any side effects to the medications prescribed?  no  Does the patient measure his/her own blood pressure or blood glucose at home?  no   Does the patient have any problems obtaining medications due to transportation or finances?   no  Understanding of regimen: excellent Understanding of indications: good Potential of compliance: excellent    Pharmacist comments: Patient presents to heart failure clinic with her daughter and medications were reviewed with a pharmacist. Patient reports that she had chest pain 2 weeks ago and took Tums and 2 NTG and then called 911. An EKG was performed and was normal so she was not admitted to the hospital. She has also been experiencing chest pain this morning but only took Tums for it.   Megan E. Supple, Pharm.D Clinical Pharmacy Resident Pager: [phone] 08/28/2014 10:58 AM

## 2014-08-28 NOTE — Patient Instructions (Signed)
START Protonix 40mg  (1 tablet) daily.   FOLLOW UP in 4 months.

## 2014-08-28 NOTE — Progress Notes (Signed)
Patient ID: [name], female   DOB: 1926-03-17, 81 y.o.   MRN: [id number] PCP: Dr. Maudie Mercury  81 yo with history of CAD, ischemic CMP and apical aneurysm with LV thrombus presents for cardiology followup. Last echo in 7/13 showed EF 25-30% with apical aneurysm.  Lexiscan myoview in 7/13 showed a large fixed inferolateral defect and a large predominantly fixed apical defect. EF 27% There was no significant ischemia and she was managed medically.    Seen by EP in 4/15 and decided against having an ICD generator change.  At last visit, volume status elevated due to eating [employer]. Lasix increase to 40 bid for 2 days then back to 40/20.   On May 13 went to ER for ab pain and N/V. Found to have e. Coli UTI. Treated with keflex. However sensitivities show resistance to cephalosporins. Still with intermittent abdominal pain. No dysuria. Still cloudy. No fevers or chills. 3 weeks ago had episode of CP that lasted a few minutes. Took Tums and NTG and got better. This morning had another episode of CP resolved with Tums. Saw Dr. Maudie Mercury last week and wanted [place] to see her if her ab pain was related to her heart. Continues to have chronic SOB with exertion. No CP with exertion. Very fatigued. No edema. + back pain. No bleeding with coumadin. Denies melena, GERD sx.   Echo 07/19/14  EF 25-30% with akinesis of distal half of LV. No apical clot. RV ok. Moderate MR  Labs (12/11): K 4.1, creatinine 1.1 Labs (4/12): K 4.6, creatinine 1.2, BUN 43, BNP 271 Labs (6/12): K 4, creatinine 1.3, LDL 66, HDL 44 Labs (10/12): K 4.8, creatinine 1.3 Labs (3/13): K 4.7, creatinine 1.4 Labs (4/13): K 4.6, creatinine 1.22, LDL 71, HDL 45 Labs (7/13): K 4.2, creatinine 1.2 Labs (10/13): K 3.8, creatinine 1.1, BNP 171 Labs (11/13): K 4, creatinine 1.2, LDL 80, HDL 46 Labs (1/14): K 3.9, creatinine 1.2 Labs (2/14): LDL 72, HDL 50 Labs (5/14): K 4, creatinine 1.5, BNP 214 Labs (9/14): K 4.4, creatinine 1.4, BNP 220, LDL 82, HDL 44 Labs  (10/14): K 3.7, creatinine 1.5 Labs (1/15): K 4.3, creatinine 1.44, BNP 944 Labs (7/15): creatinine 1.5, LFTs normal, hemoglobin 12.5, LDL 95, HDL 42 Labs (11/17/13): creatinine 1.8, K+ 4.4,  Labs (12/15): K+ 4.8 creatinine 1.32 LDL 76 HDL 44  Allergies (verified):  1)  ! Codeine  Past Medical History: 1. HYPERCHOLESTEROLEMIA  2. ARTHRITIS: Severe low back pain.  3. ISCHEMIC CARDIOMYOPATHY: Echo (1/10) with EF 25-30%, mildly dilated LV, periapical LV aneurysm, calcified LV thrombus, moderate MR.  Medtronic ICD set to VVI.  Echo (7/12) with EF 25-30%, mid-apical anteroseptal, apical lateral, apical anterior akinesis and aneurysm of the true apex; no thrombus noted, moderate MR, PA systolic pressure 30 mmHg.  4. CAD: Anterior MI in 2006 with Cypher DES to LAD.  Lexiscan myoview (7/13) with large fixed inferolateral defect and large predominantly fixed apical defect.  There was minimal ischemia so she was managed medically.  5. HYPERTENSION 6. LV thrombus on coumadin.  7. Macular degeneration 8. CKD 9. Palpitations: Holter (9/14) with PACs and PVCs, no atrial fibrillation.  10. BPPV  Social History: The patient lives alone near [place]. 3 daughters live nearby. Widowed Tobacco Use - No.  Alcohol Use - no  Family History:  CAD  ROS:  All systems reviewed and negative except as per HPI.    Current Outpatient Prescriptions  Medication Sig Dispense Refill  . acetaminophen (TYLENOL) 325  MG tablet Take 325-650 mg by mouth every 6 (six) hours as needed for mild pain.     Marland Kitchen atorvastatin (LIPITOR) 40 MG tablet TAKE 1 TABLET (40 MG TOTAL) BY MOUTH EVERY EVENING. 30 tablet 4  . carvedilol (COREG) 25 MG tablet TAKE ONE TABLET BY MOUTH TWICE DAILY 180 tablet 0  . cephALEXin (KEFLEX) 500 MG capsule Take 1 capsule (500 mg total) by mouth 3 (three) times daily. 30 capsule 0  . cholecalciferol (VITAMIN D) 1000 UNITS tablet Take 1,000 Units by mouth daily.      . folic acid (FOLVITE) 1 MG tablet  Take 1 mg by mouth daily.    . furosemide (LASIX) 40 MG tablet TAKE 1 TABLET BY MOUTH IN THE MORNING ADN 1/2 TABLET IN THE EVENING 45 tablet 5  . gabapentin (NEURONTIN) 100 MG capsule Take 100 mg by mouth 3 (three) times daily.    Marland Kitchen losartan (COZAAR) 25 MG tablet TAKE 1 TABLET (25 MG TOTAL) BY MOUTH DAILY. 90 tablet 2  . meclizine (ANTIVERT) 25 MG tablet Take 25 mg by mouth 3 (three) times daily as needed for dizziness.    . Multiple Vitamin (MULTIVITAMIN) tablet Take 1 tablet by mouth daily.      . Multiple Vitamins-Minerals (ICAPS) CAPS Take 1 capsule by mouth daily.     . nitroGLYCERIN (NITROSTAT) 0.4 MG SL tablet Place 0.4 mg under the tongue every 5 (five) minutes as needed for chest pain.    Marland Kitchen NITROSTAT 0.4 MG SL tablet DISSOLVE 1 TABLET UNDER THE TONGUE EVERY 5 MINUTES AS NEEDED FOR CHEST PAIN - (Patient not taking: Reported on 08/04/2014) 25 tablet 1  . Omega-3 Fatty Acids (FISH OIL) 1000 MG CAPS Take 1 capsule by mouth daily.     . ondansetron (ZOFRAN ODT) 4 MG disintegrating tablet 4mg  ODT q4 hours prn nausea/vomit 15 tablet 0  . saxagliptin HCl (ONGLYZA) 2.5 MG TABS tablet Take 2.5 mg by mouth daily.    . sertraline (ZOLOFT) 50 MG tablet Take 50 mg by mouth daily.     Marland Kitchen spironolactone (ALDACTONE) 25 MG tablet TAKE ONE TABLET BY MOUTH ONE TIME DAILY 90 tablet 1  . traMADol (ULTRAM) 50 MG tablet Take 50 mg by mouth every 6 (six) hours as needed for moderate pain.     Marland Kitchen trimethoprim (TRIMPEX) 100 MG tablet Take 100 mg by mouth daily.     Marland Kitchen warfarin (COUMADIN) 2.5 MG tablet TAKE AS DIRECTED BY ANTICOAGULATION CLINIC (Patient taking differently: 2.5 mg. Takes one-half tablet three times weekly and takes one whole talblet on all other days) 30 tablet 3   No current facility-administered medications for this encounter.    BP 126/48 mmHg  Pulse 73  Wt 136 lb 12.8 oz (62.052 kg)  SpO2 95% General:  Elderly woman in no distress.  Neck:  Neck supple, JVP flat Lungs:  CTAB Heart:   Non-displaced PMI, chest non-tender; regular rate and rhythm, S1, S2 without rubs or gallops. 2/6 SEM at RSB.  Carotid upstroke normal, no bruit.   Abdomen:  Soft/NT/ND, NABS, No hepatosplenomegaly. No CVA tenderness Extremities:  No clubbing or cyanosis.  No edema Neurologic:  Alert and oriented x 3. Psych:  Normal affect.  ECG: NSR with previous anterior and inferior infarct. No acute ST-T changes.   Assessment/Plan:  1. Chest pain/CAD -CP fairly atypical. ECG today unchanged. Suspect her symptoms are most likely GI/GU in nature but given recurrence of pain will check Lexiscan Myoview. Would avoid cath unless absolutely  necessary due to CKD  2. HYPERLIPIDEMIA -Continue statin.  3. Systolic CHF, chronic   NYHA class III symptoms, stable. Volume status looks good,  - Continue current spironolactone, losartan and Coreg.  Creatinine has risen with prior attempts to uptitrate losartan.  - She has decided against changing her ICD generator but will not have it explanted  4. VENTRICULAR ANEURYSM, LEFT  -History of apical aneurysm with thrombus, not seen on echo. Continue warfarin.   5. HTN -Stable, continue current meds.  6. Ab pain - recently treated with cephalexin for e.coli UTI and on Bactrim for prophylaxis. However sensitivities show resistance to cephalosporins and Bactrim. Will repeat UA & UCX. If persistent UTI only oral agent appears to be nitrofurantoin 100 bid x 7days.  Consider stopping bactrim. Will need f/u with Dr. Maudie Mercury.    Glori Bickers MD 08/28/2014

## 2014-08-29 ENCOUNTER — Telehealth (HOSPITAL_COMMUNITY): Payer: Self-pay | Admitting: *Deleted

## 2014-08-29 MED ORDER — NITROFURANTOIN MONOHYD MACRO 100 MG PO CAPS: 100.0000 mg | ORAL_CAPSULE | Freq: Two times a day (BID) | ORAL | Status: AC

## 2014-08-29 NOTE — Telephone Encounter (Signed)
Pt aware and agreeable, rx sent into [facility] pharmacy

## 2014-08-29 NOTE — Telephone Encounter (Signed)
Per Dr Haroldine Laws, pt's urine positive for E Cloi, needs macrobid 100 mg Twice daily, attempted to call pt and Left message to call back

## 2014-08-31 ENCOUNTER — Telehealth (HOSPITAL_COMMUNITY): Payer: Self-pay

## 2014-08-31 ENCOUNTER — Encounter (HOSPITAL_COMMUNITY): Payer: Medicare Other

## 2014-08-31 ENCOUNTER — Telehealth: Payer: Self-pay | Admitting: *Deleted

## 2014-08-31 LAB — URINE CULTURE: Colony Count: >=100000

## 2014-08-31 NOTE — Telephone Encounter (Signed)
Pt called stating had been placed on Nitrofurantoin 100 mg bid for 7 days for UTI . Pt states she started this medication yesterday.Pt instructed that this medication and coumadin does not have interaction and she states understanding.

## 2014-08-31 NOTE — Telephone Encounter (Signed)
Encounter complete. 

## 2014-09-05 ENCOUNTER — Ambulatory Visit (HOSPITAL_COMMUNITY): Payer: Medicare Other

## 2014-09-05 ENCOUNTER — Ambulatory Visit
Admission: RE | Admit: 2014-09-05 | Discharge: 2014-09-05 | Disposition: A | Discharge status: Home / Self Care | Payer: Medicare Other | Source: Ambulatory Visit | Attending: Internal Medicine | Admitting: Internal Medicine

## 2014-09-05 ENCOUNTER — Ambulatory Visit (HOSPITAL_COMMUNITY)
Admission: RE | Admit: 2014-09-05 | Discharge: 2014-09-05 | Disposition: A | Discharge status: Home / Self Care | Payer: Medicare Other | Source: Ambulatory Visit | Attending: Internal Medicine | Admitting: Internal Medicine

## 2014-09-05 DIAGNOSIS — I5022 Chronic systolic (congestive) heart failure: Secondary | ICD-10-CM | POA: Diagnosis not present

## 2014-09-05 DIAGNOSIS — R42 Dizziness and giddiness: Secondary | ICD-10-CM | POA: Diagnosis not present

## 2014-09-05 LAB — MYOCARDIAL PERFUSION IMAGING
CHL CUP NUCLEAR SDS: 3
CHL CUP NUCLEAR SRS: 46
CHL CUP NUCLEAR SSS: 49
CSEPPHR: 82 {beats}/min
LV sys vol: 184 mL
LVDIAVOL: 226 mL
NUC STRESS EF: 19 %
NUC STRESS TID: 1.04
Rest HR: 61 {beats}/min

## 2014-09-05 MED ORDER — REGADENOSON 0.4 MG/5ML IV SOLN
0.4000 mg | Freq: Once | INTRAVENOUS | Status: AC
  Administered 2014-09-05: 0.4 mg via INTRAVENOUS

## 2014-09-05 MED ORDER — TECHNETIUM TC 99M SESTAMIBI GENERIC - CARDIOLITE
10.9000 | Freq: Once | INTRAVENOUS | Status: AC | PRN
  Administered 2014-09-05: 10.9 via INTRAVENOUS

## 2014-09-05 MED ORDER — TECHNETIUM TC 99M SESTAMIBI GENERIC - CARDIOLITE
31.2000 | Freq: Once | INTRAVENOUS | Status: AC | PRN
  Administered 2014-09-05: 31.2 via INTRAVENOUS

## 2014-09-07 DIAGNOSIS — I10 Essential (primary) hypertension: Secondary | ICD-10-CM | POA: Diagnosis not present

## 2014-09-07 DIAGNOSIS — M818 Other osteoporosis without current pathological fracture: Secondary | ICD-10-CM | POA: Diagnosis not present

## 2014-09-07 DIAGNOSIS — E780 Pure hypercholesterolemia: Secondary | ICD-10-CM | POA: Diagnosis not present

## 2014-09-07 DIAGNOSIS — E119 Type 2 diabetes mellitus without complications: Secondary | ICD-10-CM | POA: Diagnosis not present

## 2014-09-10 DIAGNOSIS — R0602 Shortness of breath: Secondary | ICD-10-CM | POA: Diagnosis not present

## 2014-09-10 DIAGNOSIS — I509 Heart failure, unspecified: Secondary | ICD-10-CM | POA: Diagnosis not present

## 2014-09-13 ENCOUNTER — Encounter (HOSPITAL_COMMUNITY): Payer: Self-pay | Admitting: *Deleted

## 2014-09-19 ENCOUNTER — Ambulatory Visit (INDEPENDENT_AMBULATORY_CARE_PROVIDER_SITE_OTHER): Payer: Medicare Other | Admitting: *Deleted

## 2014-09-19 DIAGNOSIS — Z7901 Long term (current) use of anticoagulants: Secondary | ICD-10-CM | POA: Diagnosis not present

## 2014-09-19 DIAGNOSIS — I253 Aneurysm of heart: Secondary | ICD-10-CM | POA: Diagnosis not present

## 2014-09-19 DIAGNOSIS — Z5181 Encounter for therapeutic drug level monitoring: Secondary | ICD-10-CM | POA: Diagnosis not present

## 2014-09-19 DIAGNOSIS — I213 ST elevation (STEMI) myocardial infarction of unspecified site: Secondary | ICD-10-CM | POA: Diagnosis not present

## 2014-09-19 DIAGNOSIS — I513 Intracardiac thrombosis, not elsewhere classified: Secondary | ICD-10-CM

## 2014-09-19 LAB — POCT INR: INR: 2

## 2014-10-10 DIAGNOSIS — R0602 Shortness of breath: Secondary | ICD-10-CM | POA: Diagnosis not present

## 2014-10-10 DIAGNOSIS — I509 Heart failure, unspecified: Secondary | ICD-10-CM | POA: Diagnosis not present

## 2014-10-31 ENCOUNTER — Ambulatory Visit (INDEPENDENT_AMBULATORY_CARE_PROVIDER_SITE_OTHER): Payer: Medicare Other | Admitting: *Deleted

## 2014-10-31 DIAGNOSIS — Z5181 Encounter for therapeutic drug level monitoring: Secondary | ICD-10-CM | POA: Diagnosis not present

## 2014-10-31 DIAGNOSIS — I513 Intracardiac thrombosis, not elsewhere classified: Secondary | ICD-10-CM

## 2014-10-31 DIAGNOSIS — I253 Aneurysm of heart: Secondary | ICD-10-CM | POA: Diagnosis not present

## 2014-10-31 DIAGNOSIS — I213 ST elevation (STEMI) myocardial infarction of unspecified site: Secondary | ICD-10-CM

## 2014-10-31 DIAGNOSIS — Z7901 Long term (current) use of anticoagulants: Secondary | ICD-10-CM

## 2014-10-31 LAB — POCT INR: INR: 2.2

## 2014-11-10 ENCOUNTER — Telehealth (HOSPITAL_COMMUNITY): Payer: Self-pay | Admitting: *Deleted

## 2014-11-10 DIAGNOSIS — I509 Heart failure, unspecified: Secondary | ICD-10-CM | POA: Diagnosis not present

## 2014-11-10 DIAGNOSIS — R0602 Shortness of breath: Secondary | ICD-10-CM | POA: Diagnosis not present

## 2014-11-10 NOTE — Telephone Encounter (Signed)
Pt called and asked if it was ok to use turmeric for her joints.  She asked me to leave voicemail with response. Called pt and let her know it increases the risk of bleeding (per Wade Hampton)

## 2014-11-14 ENCOUNTER — Other Ambulatory Visit (HOSPITAL_COMMUNITY): Payer: Self-pay | Admitting: *Deleted

## 2014-11-14 MED ORDER — ATORVASTATIN CALCIUM 40 MG PO TABS: 40.0000 mg | ORAL_TABLET | Freq: Every day | ORAL | Status: DC

## 2014-11-23 ENCOUNTER — Other Ambulatory Visit (HOSPITAL_COMMUNITY): Payer: Self-pay | Admitting: *Deleted

## 2014-11-23 ENCOUNTER — Telehealth: Payer: Self-pay

## 2014-11-23 MED ORDER — CARVEDILOL 25 MG PO TABS: 25.0000 mg | ORAL_TABLET | Freq: Two times a day (BID) | ORAL | Status: DC

## 2014-11-23 NOTE — Telephone Encounter (Signed)
This refill request should be sent to the CHF clinic to complete.

## 2014-11-28 DIAGNOSIS — I10 Essential (primary) hypertension: Secondary | ICD-10-CM | POA: Diagnosis not present

## 2014-11-28 DIAGNOSIS — N390 Urinary tract infection, site not specified: Secondary | ICD-10-CM | POA: Diagnosis not present

## 2014-11-28 DIAGNOSIS — E119 Type 2 diabetes mellitus without complications: Secondary | ICD-10-CM | POA: Diagnosis not present

## 2014-11-28 DIAGNOSIS — I5020 Unspecified systolic (congestive) heart failure: Secondary | ICD-10-CM | POA: Diagnosis not present

## 2014-11-28 DIAGNOSIS — M818 Other osteoporosis without current pathological fracture: Secondary | ICD-10-CM | POA: Diagnosis not present

## 2014-12-06 DIAGNOSIS — I10 Essential (primary) hypertension: Secondary | ICD-10-CM | POA: Diagnosis not present

## 2014-12-06 DIAGNOSIS — E780 Pure hypercholesterolemia: Secondary | ICD-10-CM | POA: Diagnosis not present

## 2014-12-06 DIAGNOSIS — E119 Type 2 diabetes mellitus without complications: Secondary | ICD-10-CM | POA: Diagnosis not present

## 2014-12-06 DIAGNOSIS — I5020 Unspecified systolic (congestive) heart failure: Secondary | ICD-10-CM | POA: Diagnosis not present

## 2014-12-08 ENCOUNTER — Other Ambulatory Visit (HOSPITAL_COMMUNITY): Payer: Self-pay

## 2014-12-11 DIAGNOSIS — R0602 Shortness of breath: Secondary | ICD-10-CM | POA: Diagnosis not present

## 2014-12-11 DIAGNOSIS — I509 Heart failure, unspecified: Secondary | ICD-10-CM | POA: Diagnosis not present

## 2014-12-12 ENCOUNTER — Ambulatory Visit (INDEPENDENT_AMBULATORY_CARE_PROVIDER_SITE_OTHER): Payer: Medicare Other | Admitting: *Deleted

## 2014-12-12 DIAGNOSIS — Z7901 Long term (current) use of anticoagulants: Secondary | ICD-10-CM

## 2014-12-12 DIAGNOSIS — I513 Intracardiac thrombosis, not elsewhere classified: Secondary | ICD-10-CM

## 2014-12-12 DIAGNOSIS — I213 ST elevation (STEMI) myocardial infarction of unspecified site: Secondary | ICD-10-CM | POA: Diagnosis not present

## 2014-12-12 DIAGNOSIS — I253 Aneurysm of heart: Secondary | ICD-10-CM | POA: Diagnosis not present

## 2014-12-12 DIAGNOSIS — Z5181 Encounter for therapeutic drug level monitoring: Secondary | ICD-10-CM | POA: Diagnosis not present

## 2014-12-12 LAB — POCT INR: INR: 2.1

## 2014-12-22 ENCOUNTER — Other Ambulatory Visit: Payer: Self-pay | Admitting: Internal Medicine

## 2015-01-10 DIAGNOSIS — I509 Heart failure, unspecified: Secondary | ICD-10-CM | POA: Diagnosis not present

## 2015-01-10 DIAGNOSIS — R0602 Shortness of breath: Secondary | ICD-10-CM | POA: Diagnosis not present

## 2015-01-13 ENCOUNTER — Other Ambulatory Visit: Payer: Self-pay | Admitting: Internal Medicine

## 2015-01-19 ENCOUNTER — Other Ambulatory Visit (HOSPITAL_COMMUNITY): Payer: Self-pay | Admitting: *Deleted

## 2015-01-19 DIAGNOSIS — I5022 Chronic systolic (congestive) heart failure: Secondary | ICD-10-CM

## 2015-01-19 MED ORDER — FUROSEMIDE 40 MG PO TABS
ORAL_TABLET | ORAL | Status: DC
Start: 2015-01-19 — End: 2015-01-30

## 2015-01-23 ENCOUNTER — Ambulatory Visit (INDEPENDENT_AMBULATORY_CARE_PROVIDER_SITE_OTHER): Payer: Medicare Other | Admitting: *Deleted

## 2015-01-23 DIAGNOSIS — Z7901 Long term (current) use of anticoagulants: Secondary | ICD-10-CM | POA: Diagnosis not present

## 2015-01-23 DIAGNOSIS — Z5181 Encounter for therapeutic drug level monitoring: Secondary | ICD-10-CM | POA: Diagnosis not present

## 2015-01-23 DIAGNOSIS — I513 Intracardiac thrombosis, not elsewhere classified: Secondary | ICD-10-CM

## 2015-01-23 DIAGNOSIS — I253 Aneurysm of heart: Secondary | ICD-10-CM

## 2015-01-23 DIAGNOSIS — I213 ST elevation (STEMI) myocardial infarction of unspecified site: Secondary | ICD-10-CM | POA: Diagnosis not present

## 2015-01-23 LAB — POCT INR: INR: 1.6

## 2015-01-30 ENCOUNTER — Ambulatory Visit (HOSPITAL_COMMUNITY)
Admission: RE | Admit: 2015-01-30 | Discharge: 2015-01-30 | Disposition: A | Discharge status: Home / Self Care | Payer: Medicare Other | Source: Ambulatory Visit | Attending: Internal Medicine | Admitting: Internal Medicine

## 2015-01-30 VITALS — BP 98/58 | HR 64 | Wt 143.5 lb

## 2015-01-30 DIAGNOSIS — I213 ST elevation (STEMI) myocardial infarction of unspecified site: Secondary | ICD-10-CM | POA: Diagnosis not present

## 2015-01-30 DIAGNOSIS — I513 Intracardiac thrombosis, not elsewhere classified: Secondary | ICD-10-CM

## 2015-01-30 DIAGNOSIS — I110 Hypertensive heart disease with heart failure: Secondary | ICD-10-CM | POA: Diagnosis not present

## 2015-01-30 DIAGNOSIS — I2510 Atherosclerotic heart disease of native coronary artery without angina pectoris: Secondary | ICD-10-CM | POA: Insufficient documentation

## 2015-01-30 DIAGNOSIS — I5022 Chronic systolic (congestive) heart failure: Secondary | ICD-10-CM | POA: Insufficient documentation

## 2015-01-30 DIAGNOSIS — I2584 Coronary atherosclerosis due to calcified coronary lesion: Secondary | ICD-10-CM | POA: Diagnosis not present

## 2015-01-30 DIAGNOSIS — E785 Hyperlipidemia, unspecified: Secondary | ICD-10-CM | POA: Insufficient documentation

## 2015-01-30 LAB — BASIC METABOLIC PANEL
ANION GAP: 13 (ref 5–15)
BUN: 40 mg/dL — AB (ref 6–20)
CO2: 24 mmol/L (ref 22–32)
Calcium: 9.2 mg/dL (ref 8.9–10.3)
Chloride: 100 mmol/L — ABNORMAL LOW (ref 101–111)
Creatinine, Ser: 1.77 mg/dL — ABNORMAL HIGH (ref 0.44–1.00)
GFR calc Af Amer: 29 mL/min — ABNORMAL LOW (ref >60)
GFR, EST NON AFRICAN AMERICAN: 25 mL/min — AB (ref >60)
Glucose, Bld: 119 mg/dL — ABNORMAL HIGH (ref 65–99)
Potassium: 4.5 mmol/L (ref 3.5–5.1)
Sodium: 137 mmol/L (ref 135–145)

## 2015-01-30 LAB — BRAIN NATRIURETIC PEPTIDE: B Natriuretic Peptide: 193.3 pg/mL — ABNORMAL HIGH (ref 0.0–100.0)

## 2015-01-30 MED ORDER — FUROSEMIDE 40 MG PO TABS: 40.0000 mg | ORAL_TABLET | Freq: Two times a day (BID) | ORAL | Status: DC

## 2015-01-30 NOTE — Patient Instructions (Signed)
Routine lab work today. (bmet bnp) Will notify you of abnormal results  INCREASE Lasix to 40mg  (1 tablet) twice a day.  Lab work: 2 weeks (bmet)  FOLLOW UP: 6 weeks (Dr.McLean)  Do the following things EVERYDAY: 1) Weigh yourself in the morning before breakfast. Write it down and keep it in a log. 2) Take your medicines as prescribed 3) Eat low salt foods-Limit salt (sodium) to 2000 mg per day.  4) Stay as active as you can everyday 5) Limit all fluids for the day to less than 2 liters

## 2015-01-30 NOTE — Progress Notes (Signed)
Patient ID: [name], female   DOB: [birth year], 81 y.o.   MRN: [id number] PCP: Dr. Maudie Mercury Cardiology: Dr. Aundra Dubin  81 yo with history of CAD, ischemic CMP and apical aneurysm with LV thrombus presents for cardiology followup. Last echo in 7/13 showed EF 25-30% with apical aneurysm.  Lexiscan myoview in 7/13 showed a large fixed inferolateral defect and a large predominantly fixed apical defect. EF 27% There was no significant ischemia and she was managed medically.    Seen by EP in 4/15 and decided against having an ICD generator change.  She was having epigastric pain at last visit, this has resolved.  Cardiolite at that time in 6/16 showed large apical scar but no ischemia.    Stable currently.  She is short of breath after walking 20-30 feet and uses cane or walker.  No dyspnea at rest. No chest pain with exertion.  She did have 2 episodes of chest pain at night after dinner while either sitting down or lying down in the last few months.  Main problem continues to be low back and hip pain.  She is using tramadol for pain.  She had a mechanical fall about a month ago with no complication.  No lightheadedness or loss of consciousness. Weight is up 7 lbs.   Labs (12/11): K 4.1, creatinine 1.1 Labs (4/12): K 4.6, creatinine 1.2, BUN 43, BNP 271 Labs (6/12): K 4, creatinine 1.3, LDL 66, HDL 44 Labs (10/12): K 4.8, creatinine 1.3 Labs (3/13): K 4.7, creatinine 1.4 Labs (4/13): K 4.6, creatinine 1.22, LDL 71, HDL 45 Labs (7/13): K 4.2, creatinine 1.2 Labs (10/13): K 3.8, creatinine 1.1, BNP 171 Labs (11/13): K 4, creatinine 1.2, LDL 80, HDL 46 Labs (1/14): K 3.9, creatinine 1.2 Labs (2/14): LDL 72, HDL 50 Labs (5/14): K 4, creatinine 1.5, BNP 214 Labs (9/14): K 4.4, creatinine 1.4, BNP 220, LDL 82, HDL 44 Labs (10/14): K 3.7, creatinine 1.5 Labs (1/15): K 4.3, creatinine 1.44, BNP 944 Labs (7/15): creatinine 1.5, LFTs normal, hemoglobin 12.5, LDL 95, HDL 42 Labs (11/17/13): creatinine 1.8, K+  4.4,  Labs (12/15): K+ 4.8 creatinine 1.32 LDL 76 HDL 44 Labs (9/16): K 4.5, creatinine 1.65, hgb 11.5, LDL 88, HDL 40  Allergies (verified):  1)  ! Codeine  Past Medical History: 1. HYPERCHOLESTEROLEMIA  2. ARTHRITIS: Severe low back pain.  3. ISCHEMIC CARDIOMYOPATHY: Echo (1/10) with EF 25-30%, mildly dilated LV, periapical LV aneurysm, calcified LV thrombus, moderate MR.  Medtronic ICD set to VVI.  Echo (7/12) with EF 25-30%, mid-apical anteroseptal, apical lateral, apical anterior akinesis and aneurysm of the true apex; no thrombus noted, moderate MR, PA systolic pressure 30 mmHg.  Echo 07/19/14  EF 25-30% with akinesis of distal half of LV, no apical clot, RV ok, moderate MR.  4. CAD: Anterior MI in 2006 with Cypher DES to LAD.  Lexiscan myoview (7/13) with large fixed inferolateral defect and large predominantly fixed apical defect.  There was minimal ischemia so she was managed medically.  Lexiscan Cardiolite (6/16) with EF 19%, large fixed apical defect, no ischemia.  5. HYPERTENSION 6. LV thrombus on coumadin.  7. Macular degeneration 8. CKD 9. Palpitations: Holter (9/14) with PACs and PVCs, no atrial fibrillation.  10. BPPV  Social History: The patient lives alone near [place]. 3 daughters live nearby. Widowed Tobacco Use - No.  Alcohol Use - no  Family History:  CAD  ROS:  All systems reviewed and negative except as per HPI.  Current Outpatient Prescriptions  Medication Sig Dispense Refill  . acetaminophen (TYLENOL) 325 MG tablet Take 325-650 mg by mouth every 6 (six) hours as needed for mild pain.     Marland Kitchen atorvastatin (LIPITOR) 40 MG tablet Take 1 tablet (40 mg total) by mouth daily at 6 PM. 30 tablet 4  . carvedilol (COREG) 25 MG tablet Take 1 tablet (25 mg total) by mouth 2 (two) times daily. 180 tablet 0  . cholecalciferol (VITAMIN D) 1000 UNITS tablet Take 1,000 Units by mouth daily.      . folic acid (FOLVITE) 1 MG tablet Take 1 mg by mouth daily.    .  furosemide (LASIX) 40 MG tablet Take 1 tablet (40 mg total) by mouth 2 (two) times daily. TAKE 60 tablet 5  . gabapentin (NEURONTIN) 100 MG capsule Take 100 mg by mouth 3 (three) times daily.    Marland Kitchen losartan (COZAAR) 25 MG tablet TAKE 1 TABLET (25 MG TOTAL) BY MOUTH DAILY. 90 tablet 2  . meclizine (ANTIVERT) 25 MG tablet Take 25 mg by mouth 3 (three) times daily as needed for dizziness.    . Multiple Vitamin (MULTIVITAMIN) tablet Take 1 tablet by mouth daily.      . nitroGLYCERIN (NITROSTAT) 0.4 MG SL tablet Place 0.4 mg under the tongue every 5 (five) minutes as needed for chest pain.    . Omega-3 Fatty Acids (FISH OIL) 1000 MG CAPS Take 2 capsules by mouth daily.     . ondansetron (ZOFRAN ODT) 4 MG disintegrating tablet 4mg  ODT q4 hours prn nausea/vomit 15 tablet 0  . pantoprazole (PROTONIX) 40 MG tablet TAKE ONE TABLET BY MOUTH ONE TIME DAILY 30 tablet 2  . saxagliptin HCl (ONGLYZA) 2.5 MG TABS tablet Take 2.5 mg by mouth daily.    . sertraline (ZOLOFT) 50 MG tablet Take 50 mg by mouth daily.     Marland Kitchen spironolactone (ALDACTONE) 25 MG tablet TAKE ONE TABLET BY MOUTH ONE TIME DAILY 90 tablet 1  . traMADol (ULTRAM) 50 MG tablet Take 50 mg by mouth every 6 (six) hours as needed for moderate pain.     Marland Kitchen trimethoprim (TRIMPEX) 100 MG tablet Take 100 mg by mouth daily.     Marland Kitchen warfarin (COUMADIN) 2.5 MG tablet TAKE AS DIRECTED BY ANTICOAGULATION CLINIC 30 tablet 3   No current facility-administered medications for this encounter.    BP 98/58 mmHg  Pulse 64  Wt 143 lb 8 oz (65.091 kg)  SpO2 97% General:  Elderly woman in no distress.  Neck:  Neck supple, JVP 8 cm with HJR Lungs:  CTAB Heart:  Non-displaced PMI, chest non-tender; regular rate and rhythm, S1, S2 without rubs or gallops. 2/6 SEM at RSB.  Carotid upstroke normal, no bruit.   Abdomen:  Soft/NT/ND, NABS, No hepatosplenomegaly. No CVA tenderness Extremities:  No clubbing or cyanosis. 1+ ankle edema.  Neurologic:  Alert and oriented x  3. Psych:  Normal affect.  Assessment/Plan:  1. CAD Stable, no exertional chest pain.  The pain she had a couple of times at night after dinner sounds more like GERD.   - Continue atorvastatin.  - No ASA given stable CAD and on warfarin.  2. HYPERLIPIDEMIA Continue statin.  LDL not ideal in 9/16, but acceptable.  3. Chronic systolic CHF  Ischemic cardiomyopathy.  EF 25-30% on last echo, 19% by Cardiolite.  NYHA class III symptoms, stable. She does appear to have some volume overload on exam and weight is up.  Will need to  be careful with diuresis given CKD stage IV.  - Continue current spironolactone, losartan and Coreg.  Creatinine has risen with prior attempts to uptitrate losartan.  - She has decided against changing her ICD generator but will not have it explanted - Increase Lasix to 40 mg bid with BMET/BNP today and repeat in 2 wks.  4. H/o LV apical aneurysm with thrombus History of apical aneurysm with thrombus, thrombus not seen on last echo. Continue warfarin.  5. HTN Stable, continue current meds.  Followup in 6 wks.   Loralie Champagne MD 01/30/2015

## 2015-01-31 ENCOUNTER — Other Ambulatory Visit: Payer: Self-pay

## 2015-01-31 MED ORDER — LOSARTAN POTASSIUM 25 MG PO TABS: 25.0000 mg | ORAL_TABLET | Freq: Every day | ORAL | Status: DC

## 2015-01-31 NOTE — Telephone Encounter (Signed)
Larey Dresser, MD at 01/30/2015 12:54 PM  losartan (COZAAR) 25 MG tabletTAKE 1 TABLET (25 MG TOTAL) BY MOUTH DAILY Patient Instructions    Routine lab work today. (bmet bnp) Will notify you of abnormal results INCREASE Lasix to 40mg  (1 tablet) twice a day. Lab work: 2 weeks (bmet) FOLLOW UP: 6 weeks (Dr.McLean) Do the following things EVERYDAY: 1.   Weigh yourself in the morning before breakfast. Write it down and keep it in a log. 2.   Take your medicines as prescribed 3.   Eat low salt foods-Limit salt (sodium) to 2000 mg per day.  4.   Stay as active as you can everyday 5.   Limit all fluids for the day to less than 2 liters

## 2015-02-02 ENCOUNTER — Other Ambulatory Visit (HOSPITAL_COMMUNITY): Payer: Self-pay | Admitting: Internal Medicine

## 2015-02-10 DIAGNOSIS — I509 Heart failure, unspecified: Secondary | ICD-10-CM | POA: Diagnosis not present

## 2015-02-10 DIAGNOSIS — R0602 Shortness of breath: Secondary | ICD-10-CM | POA: Diagnosis not present

## 2015-02-12 ENCOUNTER — Other Ambulatory Visit: Payer: Self-pay

## 2015-02-12 MED ORDER — NITROGLYCERIN 0.4 MG SL SUBL: 0.4000 mg | SUBLINGUAL_TABLET | SUBLINGUAL | Status: AC | PRN

## 2015-02-13 ENCOUNTER — Ambulatory Visit (HOSPITAL_COMMUNITY)
Admission: RE | Admit: 2015-02-13 | Discharge: 2015-02-13 | Disposition: A | Discharge status: Home / Self Care | Payer: Medicare Other | Source: Ambulatory Visit | Attending: Internal Medicine | Admitting: Internal Medicine

## 2015-02-13 ENCOUNTER — Ambulatory Visit (INDEPENDENT_AMBULATORY_CARE_PROVIDER_SITE_OTHER): Payer: Medicare Other | Admitting: *Deleted

## 2015-02-13 DIAGNOSIS — I5022 Chronic systolic (congestive) heart failure: Secondary | ICD-10-CM | POA: Insufficient documentation

## 2015-02-13 DIAGNOSIS — Z7901 Long term (current) use of anticoagulants: Secondary | ICD-10-CM | POA: Diagnosis not present

## 2015-02-13 DIAGNOSIS — Z5181 Encounter for therapeutic drug level monitoring: Secondary | ICD-10-CM

## 2015-02-13 DIAGNOSIS — I253 Aneurysm of heart: Secondary | ICD-10-CM | POA: Diagnosis not present

## 2015-02-13 DIAGNOSIS — I213 ST elevation (STEMI) myocardial infarction of unspecified site: Secondary | ICD-10-CM

## 2015-02-13 DIAGNOSIS — I513 Intracardiac thrombosis, not elsewhere classified: Secondary | ICD-10-CM

## 2015-02-13 LAB — BASIC METABOLIC PANEL
Anion gap: 9 (ref 5–15)
BUN: 50 mg/dL — ABNORMAL HIGH (ref 6–20)
CHLORIDE: 104 mmol/L (ref 101–111)
CO2: 27 mmol/L (ref 22–32)
Calcium: 9.5 mg/dL (ref 8.9–10.3)
Creatinine, Ser: 1.93 mg/dL — ABNORMAL HIGH (ref 0.44–1.00)
GFR calc non Af Amer: 22 mL/min — ABNORMAL LOW (ref >60)
GFR, EST AFRICAN AMERICAN: 26 mL/min — AB (ref >60)
Glucose, Bld: 119 mg/dL — ABNORMAL HIGH (ref 65–99)
POTASSIUM: 4.7 mmol/L (ref 3.5–5.1)
Sodium: 140 mmol/L (ref 135–145)

## 2015-02-13 LAB — POCT INR: INR: 2.2

## 2015-02-19 ENCOUNTER — Other Ambulatory Visit (HOSPITAL_COMMUNITY): Payer: Self-pay | Admitting: Internal Medicine

## 2015-03-12 DIAGNOSIS — R0602 Shortness of breath: Secondary | ICD-10-CM | POA: Diagnosis not present

## 2015-03-12 DIAGNOSIS — I509 Heart failure, unspecified: Secondary | ICD-10-CM | POA: Diagnosis not present

## 2015-03-13 ENCOUNTER — Ambulatory Visit (INDEPENDENT_AMBULATORY_CARE_PROVIDER_SITE_OTHER): Payer: Medicare Other | Admitting: *Deleted

## 2015-03-13 ENCOUNTER — Ambulatory Visit (HOSPITAL_COMMUNITY)
Admission: RE | Admit: 2015-03-13 | Discharge: 2015-03-13 | Disposition: A | Discharge status: Home / Self Care | Payer: Medicare Other | Source: Ambulatory Visit | Attending: Cardiology | Admitting: Cardiology

## 2015-03-13 VITALS — BP 134/57 | HR 66 | Ht 59.0 in | Wt 139.8 lb

## 2015-03-13 DIAGNOSIS — Z5181 Encounter for therapeutic drug level monitoring: Secondary | ICD-10-CM | POA: Diagnosis not present

## 2015-03-13 DIAGNOSIS — I253 Aneurysm of heart: Secondary | ICD-10-CM

## 2015-03-13 DIAGNOSIS — E785 Hyperlipidemia, unspecified: Secondary | ICD-10-CM | POA: Insufficient documentation

## 2015-03-13 DIAGNOSIS — I5022 Chronic systolic (congestive) heart failure: Secondary | ICD-10-CM

## 2015-03-13 DIAGNOSIS — R2689 Other abnormalities of gait and mobility: Secondary | ICD-10-CM | POA: Insufficient documentation

## 2015-03-13 DIAGNOSIS — Z7901 Long term (current) use of anticoagulants: Secondary | ICD-10-CM | POA: Diagnosis not present

## 2015-03-13 DIAGNOSIS — I110 Hypertensive heart disease with heart failure: Secondary | ICD-10-CM | POA: Diagnosis not present

## 2015-03-13 DIAGNOSIS — I513 Intracardiac thrombosis, not elsewhere classified: Secondary | ICD-10-CM

## 2015-03-13 DIAGNOSIS — I2510 Atherosclerotic heart disease of native coronary artery without angina pectoris: Secondary | ICD-10-CM | POA: Diagnosis not present

## 2015-03-13 DIAGNOSIS — I213 ST elevation (STEMI) myocardial infarction of unspecified site: Secondary | ICD-10-CM

## 2015-03-13 LAB — BRAIN NATRIURETIC PEPTIDE: B Natriuretic Peptide: 368.9 pg/mL — ABNORMAL HIGH (ref 0.0–100.0)

## 2015-03-13 LAB — BASIC METABOLIC PANEL
Anion gap: 6 (ref 5–15)
BUN: 36 mg/dL — ABNORMAL HIGH (ref 6–20)
CALCIUM: 9.3 mg/dL (ref 8.9–10.3)
CO2: 27 mmol/L (ref 22–32)
CREATININE: 1.31 mg/dL — AB (ref 0.44–1.00)
Chloride: 109 mmol/L (ref 101–111)
GFR calc Af Amer: 41 mL/min — ABNORMAL LOW (ref >60)
GFR calc non Af Amer: 35 mL/min — ABNORMAL LOW (ref >60)
GLUCOSE: 128 mg/dL — AB (ref 65–99)
Potassium: 4.3 mmol/L (ref 3.5–5.1)
Sodium: 142 mmol/L (ref 135–145)

## 2015-03-13 LAB — POCT INR: INR: 2.5

## 2015-03-13 NOTE — Patient Instructions (Signed)
Labs today  You have been referred to Physical Therapy  We will contact you in 3 months to schedule your next appointment.

## 2015-03-13 NOTE — Progress Notes (Addendum)
Patient ID: [name], female   DOB: 11-Apr-1925, 81 y.o.   MRN: [id number] PCP: Dr. Maudie Mercury Cardiology: Dr. Aundra Dubin  81 yo with history of CAD, ischemic CMP and apical aneurysm with LV thrombus presents for cardiology followup. Last echo in 7/13 showed EF 25-30% with apical aneurysm.  Lexiscan myoview in 7/13 showed a large fixed inferolateral defect and a large predominantly fixed apical defect. EF 27% There was no significant ischemia and she was managed medically.    Seen by EP in 4/15 and decided against having an ICD generator change.  Cardiolite in 6/16 showed large apical scar but no ischemia.    At last appointment, she was thought to be mildly volume overloaded and Lasix was increased.  She has lost 4 lbs.  She feels like she is less short of breath.  She walks with a walker.  She is not short of breath walking around her house or walking in the office to see me today.  Her balance is poor but she has not fallen recently.  No chest pain.  She sleeps in a recliner (has done this for years).     Labs (12/11): K 4.1, creatinine 1.1 Labs (4/12): K 4.6, creatinine 1.2, BUN 43, BNP 271 Labs (6/12): K 4, creatinine 1.3, LDL 66, HDL 44 Labs (10/12): K 4.8, creatinine 1.3 Labs (3/13): K 4.7, creatinine 1.4 Labs (4/13): K 4.6, creatinine 1.22, LDL 71, HDL 45 Labs (7/13): K 4.2, creatinine 1.2 Labs (10/13): K 3.8, creatinine 1.1, BNP 171 Labs (11/13): K 4, creatinine 1.2, LDL 80, HDL 46 Labs (1/14): K 3.9, creatinine 1.2 Labs (2/14): LDL 72, HDL 50 Labs (5/14): K 4, creatinine 1.5, BNP 214 Labs (9/14): K 4.4, creatinine 1.4, BNP 220, LDL 82, HDL 44 Labs (10/14): K 3.7, creatinine 1.5 Labs (1/15): K 4.3, creatinine 1.44, BNP 944 Labs (7/15): creatinine 1.5, LFTs normal, hemoglobin 12.5, LDL 95, HDL 42 Labs (11/17/13): creatinine 1.8, K+ 4.4,  Labs (12/15): K+ 4.8 creatinine 1.32 LDL 76 HDL 44 Labs (9/16): K 4.5, creatinine 1.65, hgb 11.5, LDL 88, HDL 40 Labs (11/16): K 4.7, creatinine  1.93  Allergies (verified):  1)  ! Codeine  Past Medical History: 1. HYPERCHOLESTEROLEMIA  2. ARTHRITIS: Severe low back pain.  3. ISCHEMIC CARDIOMYOPATHY: Echo (1/10) with EF 25-30%, mildly dilated LV, periapical LV aneurysm, calcified LV thrombus, moderate MR.  Medtronic ICD set to VVI.  Echo (7/12) with EF 25-30%, mid-apical anteroseptal, apical lateral, apical anterior akinesis and aneurysm of the true apex; no thrombus noted, moderate MR, PA systolic pressure 30 mmHg.  Echo 07/19/14  EF 25-30% with akinesis of distal half of LV, no apical clot, RV ok, moderate MR.  4. CAD: Anterior MI in 2006 with Cypher DES to LAD.  Lexiscan myoview (7/13) with large fixed inferolateral defect and large predominantly fixed apical defect.  There was minimal ischemia so she was managed medically.  Lexiscan Cardiolite (6/16) with EF 19%, large fixed apical defect, no ischemia.  5. HYPERTENSION 6. LV thrombus on coumadin.  7. Macular degeneration 8. CKD 9. Palpitations: Holter (9/14) with PACs and PVCs, no atrial fibrillation.  10. BPPV  Social History: The patient lives alone near [place]. 3 daughters live nearby. Widowed Tobacco Use - No.  Alcohol Use - no  Family History:  CAD  ROS:  All systems reviewed and negative except as per HPI.    Current Outpatient Prescriptions  Medication Sig Dispense Refill  . acetaminophen (TYLENOL) 325 MG tablet Take 325-650 mg by  mouth every 6 (six) hours as needed for mild pain.     Marland Kitchen atorvastatin (LIPITOR) 40 MG tablet Take 1 tablet (40 mg total) by mouth daily at 6 PM. 30 tablet 4  . carvedilol (COREG) 25 MG tablet TAKE 1 TABLET (25 MG TOTAL) BY MOUTH 2 (TWO) TIMES DAILY. 180 tablet 0  . cholecalciferol (VITAMIN D) 1000 UNITS tablet Take 1,000 Units by mouth daily.      . folic acid (FOLVITE) 1 MG tablet Take 1 mg by mouth daily.    . furosemide (LASIX) 40 MG tablet Take 1 tablet (40 mg total) by mouth 2 (two) times daily. TAKE 60 tablet 5  . gabapentin  (NEURONTIN) 100 MG capsule Take 100 mg by mouth 3 (three) times daily.    Marland Kitchen losartan (COZAAR) 25 MG tablet Take 1 tablet (25 mg total) by mouth daily. 90 tablet 0  . meclizine (ANTIVERT) 25 MG tablet Take 25 mg by mouth 3 (three) times daily as needed for dizziness.    . Multiple Vitamin (MULTIVITAMIN) tablet Take 1 tablet by mouth daily.      . ondansetron (ZOFRAN ODT) 4 MG disintegrating tablet 4mg  ODT q4 hours prn nausea/vomit 15 tablet 0  . pantoprazole (PROTONIX) 40 MG tablet TAKE ONE TABLET BY MOUTH ONE TIME DAILY 30 tablet 2  . saxagliptin HCl (ONGLYZA) 2.5 MG TABS tablet Take 2.5 mg by mouth daily.    . sertraline (ZOLOFT) 50 MG tablet Take 50 mg by mouth daily.     Marland Kitchen spironolactone (ALDACTONE) 25 MG tablet TAKE ONE TABLET BY MOUTH ONE TIME DAILY 90 tablet 1  . traMADol (ULTRAM) 50 MG tablet Take 50 mg by mouth every 6 (six) hours as needed for moderate pain.     Marland Kitchen trimethoprim (TRIMPEX) 100 MG tablet Take 100 mg by mouth daily.     Marland Kitchen warfarin (COUMADIN) 2.5 MG tablet TAKE AS DIRECTED BY ANTICOAGULATION CLINIC 30 tablet 3  . nitroGLYCERIN (NITROSTAT) 0.4 MG SL tablet Place 1 tablet (0.4 mg total) under the tongue every 5 (five) minutes as needed for chest pain. (Patient not taking: Reported on 03/13/2015) 25 tablet 3   No current facility-administered medications for this encounter.    BP 134/57 mmHg  Pulse 66  Ht 4\' 11"  (1.499 m)  Wt 139 lb 12.8 oz (63.413 kg)  BMI 28.22 kg/m2  SpO2 100% General:  Elderly woman in no distress.  Neck:  Neck supple, JVP 7 cm Lungs:  CTAB Heart:  Non-displaced PMI, chest non-tender; regular rate and rhythm, S1, S2 without rubs or gallops. 2/6 early SEM at RUSB.  Carotid upstroke normal, no bruit.   Abdomen:  Soft/NT/ND, NABS, No hepatosplenomegaly. No CVA tenderness Extremities:  No clubbing or cyanosis. No edema.  Neurologic:  Alert and oriented x 3. Psych:  Normal affect.  Assessment/Plan:  1. CAD Stable, no exertional chest pain.   -  Continue atorvastatin.  - No ASA given stable CAD and on warfarin.  2. HYPERLIPIDEMIA Continue statin.  LDL not ideal in 9/16, but acceptable.  3. Chronic systolic CHF  Ischemic cardiomyopathy.  EF 25-30% on last echo, 19% by Cardiolite.  NYHA class III symptoms, stable. She is not volume overloaded on higher dose of Lasix.  - Continue current spironolactone, losartan and Coreg.  Creatinine has risen with prior attempts to uptitrate losartan.  - She has decided against changing her ICD generator but will not have it explanted - Continue Lasix 40 mg bid.  Check BMET/BNP. 4. H/o  LV apical aneurysm with thrombus History of apical aneurysm with thrombus, thrombus not seen on last echo. Continue warfarin.  5. HTN Stable, continue current meds. 6. Imbalance I will try to arrange home PT for balance training.   Followup in 3 months.   Loralie Champagne MD 03/13/2015

## 2015-03-13 NOTE — Addendum Note (Signed)
Encounter addended by: Larey Dresser, MD on: 03/13/2015 11:21 PM<BR>     Documentation filed: Notes Section

## 2015-03-27 ENCOUNTER — Other Ambulatory Visit: Payer: Self-pay | Admitting: *Deleted

## 2015-03-28 ENCOUNTER — Other Ambulatory Visit: Payer: Self-pay | Admitting: *Deleted

## 2015-04-10 ENCOUNTER — Ambulatory Visit (INDEPENDENT_AMBULATORY_CARE_PROVIDER_SITE_OTHER): Payer: Medicare Other | Admitting: *Deleted

## 2015-04-10 DIAGNOSIS — Z7901 Long term (current) use of anticoagulants: Secondary | ICD-10-CM | POA: Diagnosis not present

## 2015-04-10 DIAGNOSIS — I253 Aneurysm of heart: Secondary | ICD-10-CM | POA: Diagnosis not present

## 2015-04-10 DIAGNOSIS — I213 ST elevation (STEMI) myocardial infarction of unspecified site: Secondary | ICD-10-CM

## 2015-04-10 DIAGNOSIS — I513 Intracardiac thrombosis, not elsewhere classified: Secondary | ICD-10-CM

## 2015-04-10 DIAGNOSIS — Z5181 Encounter for therapeutic drug level monitoring: Secondary | ICD-10-CM

## 2015-04-10 LAB — POCT INR: INR: 2.5

## 2015-04-10 MED ORDER — PANTOPRAZOLE SODIUM 40 MG PO TBEC: 40.0000 mg | DELAYED_RELEASE_TABLET | Freq: Every day | ORAL | Status: DC

## 2015-04-10 NOTE — Telephone Encounter (Signed)
pantoprazole (PROTONIX) 40 MG tablet [id number]     Order Details    Dose, Route, Frequency: As Directed    Dispense Quantity:  30 tablet Refills:  2 Fills Remaining:  2          Sig: TAKE ONE TABLET BY MOUTH ONE TIME DAILY         Written Date:  12/22/14 Expiration Date:  12/22/15     Start Date:  12/22/14 End Date:  --     Ordering Provider:  Jolaine Artist, MD Authorizing Provider:  Jolaine Artist, MD Ordering User:  Harvie Junior, [place]

## 2015-04-12 DIAGNOSIS — R0602 Shortness of breath: Secondary | ICD-10-CM | POA: Diagnosis not present

## 2015-04-12 DIAGNOSIS — I509 Heart failure, unspecified: Secondary | ICD-10-CM | POA: Diagnosis not present

## 2015-04-16 DIAGNOSIS — H353132 Nonexudative age-related macular degeneration, bilateral, intermediate dry stage: Secondary | ICD-10-CM | POA: Diagnosis not present

## 2015-04-16 DIAGNOSIS — H1131 Conjunctival hemorrhage, right eye: Secondary | ICD-10-CM | POA: Diagnosis not present

## 2015-04-16 DIAGNOSIS — E119 Type 2 diabetes mellitus without complications: Secondary | ICD-10-CM | POA: Diagnosis not present

## 2015-04-21 ENCOUNTER — Other Ambulatory Visit: Payer: Self-pay | Admitting: Cardiology

## 2015-04-25 ENCOUNTER — Emergency Department (HOSPITAL_COMMUNITY): Payer: Medicare Other

## 2015-04-25 ENCOUNTER — Inpatient Hospital Stay (HOSPITAL_COMMUNITY)
Admission: EM | Admit: 2015-04-25 | Discharge: 2015-04-28 | DRG: 690 | Disposition: A | Discharge status: Home Health | Payer: Medicare Other | Attending: Internal Medicine | Admitting: Internal Medicine

## 2015-04-25 ENCOUNTER — Encounter (HOSPITAL_COMMUNITY): Payer: Self-pay | Admitting: Cardiology

## 2015-04-25 DIAGNOSIS — I5022 Chronic systolic (congestive) heart failure: Secondary | ICD-10-CM | POA: Diagnosis not present

## 2015-04-25 DIAGNOSIS — E7800 Pure hypercholesterolemia, unspecified: Secondary | ICD-10-CM | POA: Diagnosis present

## 2015-04-25 DIAGNOSIS — K5641 Fecal impaction: Secondary | ICD-10-CM | POA: Diagnosis not present

## 2015-04-25 DIAGNOSIS — B9620 Unspecified Escherichia coli [E. coli] as the cause of diseases classified elsewhere: Secondary | ICD-10-CM | POA: Diagnosis present

## 2015-04-25 DIAGNOSIS — Z7901 Long term (current) use of anticoagulants: Secondary | ICD-10-CM

## 2015-04-25 DIAGNOSIS — Z87442 Personal history of urinary calculi: Secondary | ICD-10-CM

## 2015-04-25 DIAGNOSIS — I213 ST elevation (STEMI) myocardial infarction of unspecified site: Secondary | ICD-10-CM

## 2015-04-25 DIAGNOSIS — I2510 Atherosclerotic heart disease of native coronary artery without angina pectoris: Secondary | ICD-10-CM | POA: Diagnosis not present

## 2015-04-25 DIAGNOSIS — N183 Chronic kidney disease, stage 3 unspecified: Secondary | ICD-10-CM | POA: Diagnosis present

## 2015-04-25 DIAGNOSIS — M1610 Unilateral primary osteoarthritis, unspecified hip: Secondary | ICD-10-CM | POA: Diagnosis not present

## 2015-04-25 DIAGNOSIS — I130 Hypertensive heart and chronic kidney disease with heart failure and stage 1 through stage 4 chronic kidney disease, or unspecified chronic kidney disease: Secondary | ICD-10-CM | POA: Diagnosis not present

## 2015-04-25 DIAGNOSIS — N390 Urinary tract infection, site not specified: Principal | ICD-10-CM | POA: Diagnosis present

## 2015-04-25 DIAGNOSIS — B372 Candidiasis of skin and nail: Secondary | ICD-10-CM | POA: Diagnosis present

## 2015-04-25 DIAGNOSIS — M810 Age-related osteoporosis without current pathological fracture: Secondary | ICD-10-CM | POA: Diagnosis present

## 2015-04-25 DIAGNOSIS — I255 Ischemic cardiomyopathy: Secondary | ICD-10-CM | POA: Diagnosis present

## 2015-04-25 DIAGNOSIS — Z833 Family history of diabetes mellitus: Secondary | ICD-10-CM

## 2015-04-25 DIAGNOSIS — M1990 Unspecified osteoarthritis, unspecified site: Secondary | ICD-10-CM | POA: Diagnosis present

## 2015-04-25 DIAGNOSIS — I513 Intracardiac thrombosis, not elsewhere classified: Secondary | ICD-10-CM | POA: Diagnosis present

## 2015-04-25 DIAGNOSIS — I252 Old myocardial infarction: Secondary | ICD-10-CM

## 2015-04-25 DIAGNOSIS — R52 Pain, unspecified: Secondary | ICD-10-CM | POA: Diagnosis not present

## 2015-04-25 DIAGNOSIS — Z823 Family history of stroke: Secondary | ICD-10-CM | POA: Diagnosis not present

## 2015-04-25 DIAGNOSIS — Z9981 Dependence on supplemental oxygen: Secondary | ICD-10-CM | POA: Diagnosis not present

## 2015-04-25 DIAGNOSIS — Z803 Family history of malignant neoplasm of breast: Secondary | ICD-10-CM | POA: Diagnosis not present

## 2015-04-25 DIAGNOSIS — M545 Low back pain: Secondary | ICD-10-CM | POA: Diagnosis not present

## 2015-04-25 DIAGNOSIS — Z955 Presence of coronary angioplasty implant and graft: Secondary | ICD-10-CM | POA: Diagnosis not present

## 2015-04-25 DIAGNOSIS — M25551 Pain in right hip: Secondary | ICD-10-CM | POA: Diagnosis not present

## 2015-04-25 DIAGNOSIS — Z90710 Acquired absence of both cervix and uterus: Secondary | ICD-10-CM | POA: Diagnosis not present

## 2015-04-25 DIAGNOSIS — M25552 Pain in left hip: Secondary | ICD-10-CM | POA: Diagnosis not present

## 2015-04-25 DIAGNOSIS — M150 Primary generalized (osteo)arthritis: Secondary | ICD-10-CM | POA: Diagnosis not present

## 2015-04-25 HISTORY — DX: Old myocardial infarction: I25.2

## 2015-04-25 HISTORY — DX: Chronic kidney disease, stage 3 (moderate): N18.3

## 2015-04-25 HISTORY — DX: Chronic kidney disease, stage 3 unspecified: N18.30

## 2015-04-25 LAB — COMPREHENSIVE METABOLIC PANEL
ALBUMIN: 4.2 g/dL (ref 3.5–5.0)
ALT: 18 U/L (ref 14–54)
ANION GAP: 10 (ref 5–15)
AST: 26 U/L (ref 15–41)
Alkaline Phosphatase: 60 U/L (ref 38–126)
BUN: 39 mg/dL — ABNORMAL HIGH (ref 6–20)
CO2: 30 mmol/L (ref 22–32)
Calcium: 9.6 mg/dL (ref 8.9–10.3)
Chloride: 101 mmol/L (ref 101–111)
Creatinine, Ser: 1.57 mg/dL — ABNORMAL HIGH (ref 0.44–1.00)
GFR calc non Af Amer: 29 mL/min — ABNORMAL LOW (ref >60)
GFR, EST AFRICAN AMERICAN: 33 mL/min — AB (ref >60)
GLUCOSE: 112 mg/dL — AB (ref 65–99)
POTASSIUM: 4.3 mmol/L (ref 3.5–5.1)
SODIUM: 141 mmol/L (ref 135–145)
TOTAL PROTEIN: 7.8 g/dL (ref 6.5–8.1)
Total Bilirubin: 0.8 mg/dL (ref 0.3–1.2)

## 2015-04-25 LAB — URINALYSIS, ROUTINE W REFLEX MICROSCOPIC
Bilirubin Urine: NEGATIVE
Glucose, UA: NEGATIVE mg/dL
Hgb urine dipstick: NEGATIVE
KETONES UR: NEGATIVE mg/dL
NITRITE: POSITIVE — AB
PH: 7 (ref 5.0–8.0)
Protein, ur: NEGATIVE mg/dL
SPECIFIC GRAVITY, URINE: 1.01 (ref 1.005–1.030)

## 2015-04-25 LAB — CBC WITH DIFFERENTIAL/PLATELET
BASOS ABS: 0 10*3/uL (ref 0.0–0.1)
BASOS PCT: 0 %
Eosinophils Absolute: 0.2 10*3/uL (ref 0.0–0.7)
Eosinophils Relative: 3 %
HEMATOCRIT: 37.5 % (ref 36.0–46.0)
HEMOGLOBIN: 12.3 g/dL (ref 12.0–15.0)
Lymphocytes Relative: 26 %
Lymphs Abs: 1.3 10*3/uL (ref 0.7–4.0)
MCH: 32.6 pg (ref 26.0–34.0)
MCHC: 32.8 g/dL (ref 30.0–36.0)
MCV: 99.5 fL (ref 78.0–100.0)
Monocytes Absolute: 0.3 10*3/uL (ref 0.1–1.0)
Monocytes Relative: 5 %
NEUTROS ABS: 3.4 10*3/uL (ref 1.7–7.7)
NEUTROS PCT: 66 %
Platelets: 180 10*3/uL (ref 150–400)
RBC: 3.77 MIL/uL — ABNORMAL LOW (ref 3.87–5.11)
RDW: 12 % (ref 11.5–15.5)
WBC: 5.1 10*3/uL (ref 4.0–10.5)

## 2015-04-25 LAB — PROTIME-INR
INR: 1.45 (ref 0.00–1.49)
PROTHROMBIN TIME: 17.7 s — AB (ref 11.6–15.2)

## 2015-04-25 LAB — URINE MICROSCOPIC-ADD ON: RBC / HPF: NONE SEEN RBC/hpf (ref 0–5)

## 2015-04-25 MED ORDER — MECLIZINE HCL 12.5 MG PO TABS: 25.0000 mg | ORAL_TABLET | Freq: Three times a day (TID) | ORAL | Status: DC | PRN

## 2015-04-25 MED ORDER — HYDROMORPHONE HCL 1 MG/ML IJ SOLN
0.5000 mg | Freq: Once | INTRAMUSCULAR | Status: AC
  Administered 2015-04-25: 0.5 mg via INTRAVENOUS
  Filled 2015-04-25: qty 1

## 2015-04-25 MED ORDER — WARFARIN SODIUM 5 MG PO TABS
2.5000 mg | ORAL_TABLET | Freq: Every day | ORAL | Status: DC
  Administered 2015-04-25: 2.5 mg via ORAL
  Filled 2015-04-25: qty 1

## 2015-04-25 MED ORDER — DEXTROSE 5 % IV SOLN
1.0000 g | Freq: Once | INTRAVENOUS | Status: AC
  Administered 2015-04-25: 1 g via INTRAVENOUS
  Filled 2015-04-25: qty 10

## 2015-04-25 MED ORDER — PANTOPRAZOLE SODIUM 40 MG PO TBEC
40.0000 mg | DELAYED_RELEASE_TABLET | Freq: Every day | ORAL | Status: DC
  Administered 2015-04-25 – 2015-04-28 (×4): 40 mg via ORAL
  Filled 2015-04-25 (×4): qty 1

## 2015-04-25 MED ORDER — LOSARTAN POTASSIUM 25 MG PO TABS
25.0000 mg | ORAL_TABLET | Freq: Every day | ORAL | Status: DC
  Administered 2015-04-25 – 2015-04-28 (×4): 25 mg via ORAL
  Filled 2015-04-25 (×7): qty 1

## 2015-04-25 MED ORDER — SODIUM CHLORIDE 0.9% FLUSH
3.0000 mL | Freq: Two times a day (BID) | INTRAVENOUS | Status: DC
  Administered 2015-04-25 – 2015-04-26 (×4): 3 mL via INTRAVENOUS

## 2015-04-25 MED ORDER — SPIRONOLACTONE 25 MG PO TABS
25.0000 mg | ORAL_TABLET | Freq: Every day | ORAL | Status: DC
  Administered 2015-04-25 – 2015-04-28 (×4): 25 mg via ORAL
  Filled 2015-04-25 (×4): qty 1

## 2015-04-25 MED ORDER — NYSTATIN 100000 UNIT/GM EX CREA
TOPICAL_CREAM | CUTANEOUS | Status: AC
  Filled 2015-04-25: qty 15

## 2015-04-25 MED ORDER — WARFARIN - PHARMACIST DOSING INPATIENT
Status: DC
  Administered 2015-04-26 – 2015-04-27 (×2)

## 2015-04-25 MED ORDER — PREDNISONE 10 MG PO TABS
10.0000 mg | ORAL_TABLET | Freq: Two times a day (BID) | ORAL | Status: DC
  Administered 2015-04-26 – 2015-04-28 (×5): 10 mg via ORAL
  Filled 2015-04-25 (×5): qty 1

## 2015-04-25 MED ORDER — ACETAMINOPHEN 325 MG PO TABS
325.0000 mg | ORAL_TABLET | Freq: Four times a day (QID) | ORAL | Status: DC
  Administered 2015-04-25 – 2015-04-28 (×11): 650 mg via ORAL
  Filled 2015-04-25 (×11): qty 2

## 2015-04-25 MED ORDER — NYSTATIN 100000 UNIT/GM EX CREA
TOPICAL_CREAM | Freq: Two times a day (BID) | CUTANEOUS | Status: DC
  Filled 2015-04-25: qty 15

## 2015-04-25 MED ORDER — FUROSEMIDE 40 MG PO TABS
40.0000 mg | ORAL_TABLET | Freq: Two times a day (BID) | ORAL | Status: DC
  Administered 2015-04-25 – 2015-04-28 (×6): 40 mg via ORAL
  Filled 2015-04-25 (×7): qty 1

## 2015-04-25 MED ORDER — TRAMADOL HCL 50 MG PO TABS: 50.0000 mg | ORAL_TABLET | Freq: Four times a day (QID) | ORAL | Status: DC | PRN

## 2015-04-25 MED ORDER — DEXTROSE 5 % IV SOLN
1.0000 g | INTRAVENOUS | Status: DC
  Administered 2015-04-26 – 2015-04-27 (×2): 1 g via INTRAVENOUS
  Filled 2015-04-25 (×5): qty 10

## 2015-04-25 MED ORDER — ACETAMINOPHEN 325 MG PO TABS: 325.0000 mg | ORAL_TABLET | Freq: Four times a day (QID) | ORAL | Status: DC | PRN

## 2015-04-25 MED ORDER — SERTRALINE HCL 50 MG PO TABS
50.0000 mg | ORAL_TABLET | Freq: Every day | ORAL | Status: DC
  Filled 2015-04-25: qty 1

## 2015-04-25 MED ORDER — SERTRALINE HCL 50 MG PO TABS
50.0000 mg | ORAL_TABLET | Freq: Every day | ORAL | Status: DC
  Administered 2015-04-25 – 2015-04-28 (×4): 50 mg via ORAL
  Filled 2015-04-25 (×3): qty 1

## 2015-04-25 MED ORDER — ATORVASTATIN CALCIUM 40 MG PO TABS
40.0000 mg | ORAL_TABLET | Freq: Every day | ORAL | Status: DC
  Administered 2015-04-25 – 2015-04-27 (×3): 40 mg via ORAL
  Filled 2015-04-25 (×3): qty 1

## 2015-04-25 MED ORDER — MORPHINE SULFATE (PF) 4 MG/ML IV SOLN
4.0000 mg | INTRAVENOUS | Status: DC | PRN
  Administered 2015-04-25 – 2015-04-26 (×6): 4 mg via INTRAVENOUS
  Filled 2015-04-25 (×7): qty 1

## 2015-04-25 MED ORDER — TRAMADOL HCL 50 MG PO TABS: 50.0000 mg | ORAL_TABLET | Freq: Two times a day (BID) | ORAL | Status: DC | PRN

## 2015-04-25 MED ORDER — NYSTATIN 100000 UNIT/GM EX CREA
TOPICAL_CREAM | Freq: Two times a day (BID) | CUTANEOUS | Status: DC
  Administered 2015-04-25 – 2015-04-27 (×4): via TOPICAL
  Administered 2015-04-27 – 2015-04-28 (×2): 1 via TOPICAL
  Filled 2015-04-25: qty 15

## 2015-04-25 MED ORDER — POLYETHYLENE GLYCOL 3350 17 G PO PACK
17.0000 g | PACK | Freq: Every day | ORAL | Status: DC
  Administered 2015-04-26 – 2015-04-28 (×3): 17 g via ORAL
  Filled 2015-04-25 (×3): qty 1

## 2015-04-25 MED ORDER — LINAGLIPTIN 5 MG PO TABS
5.0000 mg | ORAL_TABLET | Freq: Every day | ORAL | Status: DC
  Administered 2015-04-25 – 2015-04-26 (×2): 5 mg via ORAL
  Filled 2015-04-25 (×2): qty 1

## 2015-04-25 MED ORDER — CARVEDILOL 12.5 MG PO TABS
25.0000 mg | ORAL_TABLET | Freq: Two times a day (BID) | ORAL | Status: DC
  Administered 2015-04-25 – 2015-04-28 (×6): 25 mg via ORAL
  Filled 2015-04-25 (×6): qty 2

## 2015-04-25 NOTE — ED Notes (Signed)
Attempted to call report but nurse not available.  Receiving RN will call back when ready for report.  Hospitalist at bedside.

## 2015-04-25 NOTE — Progress Notes (Signed)
[facility] for Warfarin Indication: VTE / LV Thrombus  Allergies  Allergen Reactions  . Codeine Nausea And Vomiting   Patient Measurements: Height: 4\' 11"  (149.9 cm) Weight: 140 lb (63.504 kg) IBW/kg (Calculated) : 43.2  Vital Signs: Temp: 98.5 F (36.9 C) (02/01 1216) Temp Source: Oral (02/01 1216) BP: 141/44 mmHg (02/01 1216) Pulse Rate: 64 (02/01 1216)  Labs:  Recent Labs  04/25/15 0830 04/25/15 1445  HGB 12.3  --   HCT 37.5  --   PLT 180  --   LABPROT  --  17.7*  INR  --  1.45  CREATININE 1.57*  --    Estimated Creatinine Clearance: 20.4 mL/min (by C-G formula based on Cr of 1.57).  Medical History: Past Medical History  Diagnosis Date  . Pure hypercholesterolemia   . Arthritis     severe lower back pain  . Other specified forms of chronic ischemic heart disease     echo (1/10) with EF 25-30%, mildly dilated LV, periapical LV aneueysm, calcified LV thombus, moderate MR. Medtronic ICD set to VVI.   Marland Kitchen CAD (coronary artery disease)     anterior MI in 2006 with Cypher DES to LAD  . HTN (hypertension)   . LV (left ventricular) mural thrombus (HCC)     on coumadin   . Nephrolithiasis   . CHF (congestive heart failure) ([place])   . Cardiomyopathy   . OA (osteoarthritis) of hip   . Osteoporosis   . On home oxygen therapy     at night  . MI, old    Medications:  Prescriptions prior to admission  Medication Sig Dispense Refill Last Dose  . acetaminophen (TYLENOL) 325 MG tablet Take 325-650 mg by mouth every 6 (six) hours as needed for mild pain.    Past Week at Unknown time  . atorvastatin (LIPITOR) 40 MG tablet Take 1 tablet (40 mg total) by mouth daily at 6 PM. 30 tablet 4 04/24/2015 at Unknown time  . carvedilol (COREG) 25 MG tablet TAKE 1 TABLET (25 MG TOTAL) BY MOUTH 2 (TWO) TIMES DAILY. 180 tablet 0 04/24/2015 at Unknown time  . cholecalciferol (VITAMIN D) 1000 UNITS tablet Take 1,000 Units by mouth daily.     04/24/2015 at  Unknown time  . folic acid (FOLVITE) 1 MG tablet Take 1 mg by mouth daily.   04/24/2015 at Unknown time  . furosemide (LASIX) 40 MG tablet Take 1 tablet (40 mg total) by mouth 2 (two) times daily. TAKE 60 tablet 5 04/24/2015 at Unknown time  . losartan (COZAAR) 25 MG tablet TAKE 1 TABLET (25 MG TOTAL) BY MOUTH DAILY. 90 tablet 0 04/24/2015 at Unknown time  . meclizine (ANTIVERT) 25 MG tablet Take 25 mg by mouth 3 (three) times daily as needed for dizziness.   Past Month at Unknown time  . nitroGLYCERIN (NITROSTAT) 0.4 MG SL tablet Place 1 tablet (0.4 mg total) under the tongue every 5 (five) minutes as needed for chest pain. 25 tablet 3 unknown  . pantoprazole (PROTONIX) 40 MG tablet Take 1 tablet (40 mg total) by mouth daily. 30 tablet 2 04/24/2015 at Unknown time  . saxagliptin HCl (ONGLYZA) 2.5 MG TABS tablet Take 2.5 mg by mouth daily.   04/24/2015 at Unknown time  . sertraline (ZOLOFT) 50 MG tablet Take 50 mg by mouth daily.    04/24/2015 at Unknown time  . spironolactone (ALDACTONE) 25 MG tablet TAKE ONE TABLET BY MOUTH ONE TIME DAILY 90 tablet 1  04/24/2015 at Unknown time  . traMADol (ULTRAM) 50 MG tablet Take 50 mg by mouth every 6 (six) hours as needed for moderate pain.    Past Week at Unknown time  . trimethoprim (TRIMPEX) 100 MG tablet Take 100 mg by mouth daily.    04/24/2015 at Unknown time  . warfarin (COUMADIN) 2.5 MG tablet Take 1.25-2.5 mg by mouth daily. Take 1 tablet everyday except on Monday, Wednesday,and Friday ( take 1/2 tablet those days)   04/24/2015 at Unknown time   Assessment: Okay for Protocol, INR below goal.  Patient has already received 2.5mg  dose today (usual Wednesday dose is 1.25mg ).   Goal of Therapy:  INR 2-3   Plan:  No further warfarin today. Daily PT/INR. Monitor for signs and symptoms of bleeding.   Pricilla Larsson 04/25/2015,3:43 PM

## 2015-04-25 NOTE — ED Notes (Addendum)
Bilateral hip pain since last Wednesday.  No new injuries.    [place]  thanksgiving.  CBG 134

## 2015-04-25 NOTE — ED Provider Notes (Addendum)
CSN: [id number]     Arrival date & time 04/25/15  0800 History  By signing my name below, I, Emmanuella Mensah, attest that this documentation has been prepared under the direction and in the presence of Milton Ferguson, MD. Electronically Signed: Judithann Sauger, ED Scribe. 04/25/2015. 8:18 AM.    Chief Complaint  Patient presents with  . Hip Pain   Patient is a 82 y.o. female presenting with back pain. The history is provided by the patient (Patient complains of back pain and weakness her,). No language interpreter was used.  Back Pain Location:  Lumbar spine Quality:  Unable to specify Radiates to:  Does not radiate Pain severity:  Moderate Pain is:  Same all the time Onset quality:  Gradual Duration:  1 week Timing:  Constant Progression:  Worsening Chronicity:  New Context: not falling   Relieved by:  None tried Worsened by:  Nothing tried Ineffective treatments:  None tried Associated symptoms: no bladder incontinence, no bowel incontinence, no chest pain, no fever and no numbness   Risk factors: hx of osteoporosis     HPI Comments: [name] is a 82 y.o. female with a hx of arthritis, osteoporosis, and HTN who presents to the Emergency Department complaining of gradually worsening constant non-radiating moderate lower back pain onset one week ago. She reports associated difficulty ambulating secondary to pain. No alleviating factors noted. Pt has not tried any medications PTA. She denies any recent falls, injuries, or trauma. No fever, chills, SOB, rash, or bowel/bladder incontinence.     Past Medical History  Diagnosis Date  . Pure hypercholesterolemia   . Arthritis     severe lower back pain  . Other specified forms of chronic ischemic heart disease     echo (1/10) with EF 25-30%, mildly dilated LV, periapical LV aneueysm, calcified LV thombus, moderate MR. Medtronic ICD set to VVI.   Marland Kitchen CAD (coronary artery disease)     anterior MI in 2006 with Cypher DES to LAD  .  HTN (hypertension)   . LV (left ventricular) mural thrombus (HCC)     on coumadin   . Nephrolithiasis   . CHF (congestive heart failure) ([place])   . Cardiomyopathy   . OA (osteoarthritis) of hip   . Osteoporosis   . On home oxygen therapy     at night  . MI, old    Past Surgical History  Procedure Laterality Date  . Aicd implantation      ICD-Medtronic. Remote-yes   . Coronary angioplasty with stent placement    . Coronary angioplasty    . Cardiac catheterization    . Vesicovaginal fistula closure w/ tah  1978   Family History  Problem Relation Age of Onset  . Breast cancer Mother   . Stroke Father   . Stroke Brother   . Diabetes Brother    Social History  Substance Use Topics  . Smoking status: Never Smoker   . Smokeless tobacco: Never Used  . Alcohol Use: No   OB History    No data available     Review of Systems  Constitutional: Negative for fever, appetite change and fatigue.  HENT: Negative for congestion, ear discharge and sinus pressure.   Eyes: Negative for discharge.  Respiratory: Negative for cough.   Cardiovascular: Negative for chest pain.  Gastrointestinal: Negative for diarrhea and bowel incontinence.  Endocrine: Negative for polyphagia.  Genitourinary: Negative for bladder incontinence, frequency and hematuria.  Musculoskeletal: Positive for back pain and gait problem (  secondary to pain).  Skin: Negative for rash.  Neurological: Negative for seizures and numbness.  Hematological: Negative for adenopathy.  Psychiatric/Behavioral: Negative for hallucinations.      Allergies  Codeine  Home Medications   Prior to Admission medications   Medication Sig Start Date End Date Taking? Authorizing Provider  acetaminophen (TYLENOL) 325 MG tablet Take 325-650 mg by mouth every 6 (six) hours as needed for mild pain.     Historical Provider, MD  atorvastatin (LIPITOR) 40 MG tablet Take 1 tablet (40 mg total) by mouth daily at 6 PM. 11/14/14   Larey Dresser, MD  carvedilol (COREG) 25 MG tablet TAKE 1 TABLET (25 MG TOTAL) BY MOUTH 2 (TWO) TIMES DAILY. 02/19/15   Jolaine Artist, MD  cholecalciferol (VITAMIN D) 1000 UNITS tablet Take 1,000 Units by mouth daily.      Historical Provider, MD  folic acid (FOLVITE) 1 MG tablet Take 1 mg by mouth daily.    Historical Provider, MD  furosemide (LASIX) 40 MG tablet Take 1 tablet (40 mg total) by mouth 2 (two) times daily. TAKE 01/30/15   Larey Dresser, MD  gabapentin (NEURONTIN) 100 MG capsule Take 100 mg by mouth 3 (three) times daily.    Historical Provider, MD  losartan (COZAAR) 25 MG tablet TAKE 1 TABLET (25 MG TOTAL) BY MOUTH DAILY. 04/23/15   Larey Dresser, MD  meclizine (ANTIVERT) 25 MG tablet Take 25 mg by mouth 3 (three) times daily as needed for dizziness.    Historical Provider, MD  Multiple Vitamin (MULTIVITAMIN) tablet Take 1 tablet by mouth daily.      Historical Provider, MD  nitroGLYCERIN (NITROSTAT) 0.4 MG SL tablet Place 1 tablet (0.4 mg total) under the tongue every 5 (five) minutes as needed for chest pain. Patient not taking: Reported on 03/13/2015 02/12/15   Evans Lance, MD  ondansetron (ZOFRAN ODT) 4 MG disintegrating tablet 4mg  ODT q4 hours prn nausea/vomit 08/04/14   Pamella Pert, MD  pantoprazole (PROTONIX) 40 MG tablet Take 1 tablet (40 mg total) by mouth daily. 04/10/15   Larey Dresser, MD  saxagliptin HCl (ONGLYZA) 2.5 MG TABS tablet Take 2.5 mg by mouth daily.    Historical Provider, MD  sertraline (ZOLOFT) 50 MG tablet Take 50 mg by mouth daily.     Historical Provider, MD  spironolactone (ALDACTONE) 25 MG tablet TAKE ONE TABLET BY MOUTH ONE TIME DAILY 02/02/15   Jolaine Artist, MD  traMADol (ULTRAM) 50 MG tablet Take 50 mg by mouth every 6 (six) hours as needed for moderate pain.     Historical Provider, MD  trimethoprim (TRIMPEX) 100 MG tablet Take 100 mg by mouth daily.     Historical Provider, MD  warfarin (COUMADIN) 2.5 MG tablet TAKE AS DIRECTED BY  ANTICOAGULATION CLINIC 01/15/15   Evans Lance, MD   BP 117/83 mmHg  Pulse 65  Temp(Src) 98.2 F (36.8 C) (Oral)  Ht 4\' 11"  (1.499 m)  Wt 140 lb (63.504 kg)  BMI 28.26 kg/m2  SpO2 96% Physical Exam  Constitutional: She is oriented to person, place, and time. She appears well-developed.  HENT:  Head: Normocephalic.  Eyes: Conjunctivae and EOM are normal. No scleral icterus.  Neck: Neck supple. No tracheal deviation present. No thyromegaly present.  Cardiovascular: Normal rate and regular rhythm.  Exam reveals no gallop and no friction rub.   No murmur heard. Pulmonary/Chest: No stridor. She has no wheezes. She has no rales. She exhibits no  tenderness.  Abdominal: She exhibits no distension. There is no tenderness. There is no rebound.  Musculoskeletal: Normal range of motion. She exhibits tenderness. She exhibits no edema.  Tenderness to lower lumbar spine  Lymphadenopathy:    She has no cervical adenopathy.  Neurological: She is oriented to person, place, and time. She exhibits normal muscle tone. Coordination normal.  Skin: Skin is warm. No rash noted. No erythema.  Psychiatric: She has a normal mood and affect. Her behavior is normal.    ED Course  Procedures (including critical care time) DIAGNOSTIC STUDIES: Oxygen Saturation is 96% on RA, adequate by my interpretation.    COORDINATION OF CARE: 8:13 AM- Pt advised of plan for treatment and pt agrees. Pt will receive an x-ray for further devaluation. She will also receive pain medication.    Imaging Review No results found.   Milton Ferguson, MD has personally reviewed and evaluated these images and lab results as part of his medical decision-making.   MDM   Final diagnoses:  None   Admit for urinary tract infection  Milton Ferguson, MD 04/25/15 1123                The chart was scribed for me under my direct supervision.  I personally performed the history, physical, and medical decision making and all procedures  in the evaluation of this patient.Milton Ferguson, MD 04/25/15 [phone]

## 2015-04-25 NOTE — ED Notes (Signed)
Pt c/o bilateral hip pain. States she fell at home x 2 the week of Thanksgiving and has had intermittent pain since. Pt states she was not evaluated after falls in November. Pt states she sleeps in her recliner and woke up the am with worsening pain upon attempting to get up and walk. Pt lives alone and walks with walker at home. Denies gi/gu sx. Denies new injury.

## 2015-04-25 NOTE — H&P (Signed)
Kipp Laurence  History and Physical  LEIALOHA CEASE [id number] DOB: 30-Oct-1925 [name]: 04/25/2015  Referring physician: ED PCP: [name], MD   Chief Complaint: weakness  HPI:  82 year old female with PMH of CAD, ischemic CMP and apical aneurysm with LV thrombus admitted for urinary tract infection.  Patient states this morning she awoke at 3am to use the restroom per her normal since she takes a water pill.  She mentions that at this time she had no difficulty ambulating.  She then awoke at 7am and states that due to significant and severe hip pain she could not ambulate.  She has not had pain of this intensity before.  She was brought into [facility] for further evaluation.  She was given Dilaudid in the ED but this did not significantly decrease her pain.  She was found to have an urinary tract infection on urinalysis (large LE, positive Nitrite, packed field bacteria and WBCs).  Patient denies fevers, nausea, vomiting, urgency, frequency, dysuria, hematuria.  Pain posterolateral lumbar spine and PSIS.  Last BM was yesterday but prior to that patient states she experienced six days of constipation for which she took 2  Doses of miralax.   Pain present on movement, does not radiate and is 10/10 at worst.     Pertinent labs: below Imaging: independently reviewed. Reported below  Review of Systems:  As per HPI Negative for fever, visual changes, sore throat, rash, new muscle aches, chest pain, SOB, dysuria, bleeding, n/v/abdominal pain.  Past Medical History  Diagnosis Date  . Pure hypercholesterolemia   . Arthritis     severe lower back pain  . Other specified forms of chronic ischemic heart disease     echo (1/10) with EF 25-30%, mildly dilated LV, periapical LV aneueysm, calcified LV thombus, moderate MR. Medtronic ICD set to VVI.   Marland Kitchen CAD (coronary artery disease)     anterior MI in 2006 with Cypher DES to LAD  . HTN (hypertension)   . LV (left ventricular) mural thrombus (HCC)     on  coumadin   . Nephrolithiasis   . CHF (congestive heart failure) ([place])   . Cardiomyopathy   . OA (osteoarthritis) of hip   . Osteoporosis   . On home oxygen therapy     at night  . MI, old     Past Surgical History  Procedure Laterality Date  . Aicd implantation      ICD-Medtronic. Remote-yes   . Coronary angioplasty with stent placement    . Coronary angioplasty    . Cardiac catheterization    . Vesicovaginal fistula closure w/ tah  1978    Social History:  reports that she has never smoked. She has never used smokeless tobacco. She reports that she does not drink alcohol or use illicit drugs. lives alone Self-care  Allergies  Allergen Reactions  . Codeine Nausea And Vomiting    Family History  Problem Relation Age of Onset  . Breast cancer Mother   . Stroke Father   . Stroke Brother   . Diabetes Brother      Prior to Admission medications   Medication Sig Start Date End Date Taking? Authorizing Provider  acetaminophen (TYLENOL) 325 MG tablet Take 325-650 mg by mouth every 6 (six) hours as needed for mild pain.    Yes Historical Provider, MD  atorvastatin (LIPITOR) 40 MG tablet Take 1 tablet (40 mg total) by mouth daily at 6 PM. 11/14/14  Yes Larey Dresser, MD  carvedilol (COREG) 25 MG tablet TAKE 1 TABLET (25 MG TOTAL) BY MOUTH 2 (TWO) TIMES DAILY. 02/19/15  Yes Jolaine Artist, MD  cholecalciferol (VITAMIN D) 1000 UNITS tablet Take 1,000 Units by mouth daily.     Yes Historical Provider, MD  folic acid (FOLVITE) 1 MG tablet Take 1 mg by mouth daily.   Yes Historical Provider, MD  furosemide (LASIX) 40 MG tablet Take 1 tablet (40 mg total) by mouth 2 (two) times daily. TAKE 01/30/15  Yes Larey Dresser, MD  losartan (COZAAR) 25 MG tablet TAKE 1 TABLET (25 MG TOTAL) BY MOUTH DAILY. 04/23/15  Yes Larey Dresser, MD  meclizine (ANTIVERT) 25 MG tablet Take 25 mg by mouth 3 (three) times daily as needed for dizziness.   Yes Historical Provider, MD  nitroGLYCERIN  (NITROSTAT) 0.4 MG SL tablet Place 1 tablet (0.4 mg total) under the tongue every 5 (five) minutes as needed for chest pain. 02/12/15  Yes Evans Lance, MD  pantoprazole (PROTONIX) 40 MG tablet Take 1 tablet (40 mg total) by mouth daily. 04/10/15  Yes Larey Dresser, MD  saxagliptin HCl (ONGLYZA) 2.5 MG TABS tablet Take 2.5 mg by mouth daily.   Yes Historical Provider, MD  sertraline (ZOLOFT) 50 MG tablet Take 50 mg by mouth daily.    Yes Historical Provider, MD  spironolactone (ALDACTONE) 25 MG tablet TAKE ONE TABLET BY MOUTH ONE TIME DAILY 02/02/15  Yes Jolaine Artist, MD  traMADol (ULTRAM) 50 MG tablet Take 50 mg by mouth every 6 (six) hours as needed for moderate pain.    Yes Historical Provider, MD  trimethoprim (TRIMPEX) 100 MG tablet Take 100 mg by mouth daily.    Yes Historical Provider, MD  warfarin (COUMADIN) 2.5 MG tablet Take 2.5 mg by mouth daily. Take 1 tablet everyday except on Monday, Wednesday,and Friday ( take 1/2 tablet those days)   Yes Historical Provider, MD   Physical Exam: Filed Vitals:   04/25/15 1100 04/25/15 1101 04/25/15 1138 04/25/15 1216  BP: 137/65  151/64 141/44  Pulse:  62 73 64  Temp:   98.6 F (37 C) 98.5 F (36.9 C)  TempSrc:   Oral Oral  Resp:   18 18  Height:      Weight:      SpO2:  91% 99% 98%     General:  Appears calm and comfortable Eyes: PERRL, normal lids, irises & conjunctiva, glasses in placed ENT: grossly normal hearing, lips & tongue Neck: no LAD, masses or thyromegaly Cardiovascular: III/VI systolic murmur heard best in right upper sternal border, No LE edema. Telemetry: SR, no arrhythmias  Respiratory: CTA bilaterally, no w/r/r. Normal respiratory effort. Abdomen: soft, ntnd Skin: no rash or induration seen on limited exam Musculoskeletal: decreased power lower extremities due to pain, decreased ROM (both active and passive) on flexion of the hip as well as flexion of the knees bilaterally due to pain, decreased power plantar  and dorsiflexion due to pain, intact power bilaterally upper and lower extremities Psychiatric: grossly normal mood and affect, speech fluent and appropriate Neurologic: grossly non-focal.  Wt Readings from Last 3 Encounters:  04/25/15 63.504 kg (140 lb)  03/13/15 63.413 kg (139 lb 12.8 oz)  01/30/15 65.091 kg (143 lb 8 oz)    Labs on Admission:  Basic Metabolic Panel:  Recent Labs Lab 04/25/15 0830  NA 141  K 4.3  CL 101  CO2 30  GLUCOSE 112*  BUN 39*  CREATININE 1.57*  CALCIUM  9.6    Liver Function Tests:  Recent Labs Lab 04/25/15 0830  AST 26  ALT 18  ALKPHOS 60  BILITOT 0.8  PROT 7.8  ALBUMIN 4.2   No results for input(s): LIPASE, AMYLASE in the last 168 hours. No results for input(s): AMMONIA in the last 168 hours.  CBC:  Recent Labs Lab 04/25/15 0830  WBC 5.1  NEUTROABS 3.4  HGB 12.3  HCT 37.5  MCV 99.5  PLT 180    Cardiac Enzymes: No results for input(s): CKTOTAL, CKMB, CKMBINDEX, TROPONINI in the last 168 hours.  Troponin (Point of Care Test) No results for input(s): TROPIPOC in the last 72 hours.  BNP (last 3 results) No results for input(s): PROBNP in the last 8760 hours.  CBG: No results for input(s): GLUCAP in the last 168 hours.   Radiological Exams on Admission: Dg Lumbar Spine Complete  04/25/2015  CLINICAL DATA:  Severe low back and bilateral leg pain for 1 week rendering the patient unable to walk. Initial encounter. No known injury. EXAM: LUMBAR SPINE - COMPLETE 4+ VIEW COMPARISON:  Plain films lumbar spine 06/29/2014. CT abdomen and pelvis 09/11/2010. FINDINGS: Vertebral body height is maintained. There is marked convex right scoliosis. Severe multilevel loss of disc space height and facet degenerative disease are seen. The appearance is not markedly changed compared to the prior plain films. Bones are osteopenic. Large stool ball in the rectum is noted. IMPRESSION: No acute abnormality. Marked convex right scoliosis and  multilevel degenerative change. Large stool ball in the rectum. Electronically Signed   By: Inge Rise M.D.   On: 04/25/2015 09:28   Dg Hip Unilat With Pelvis 2-3 Views Right  04/25/2015  CLINICAL DATA:  Back pain and bilateral hip pain. EXAM: DG HIP (WITH OR WITHOUT PELVIS) 2-3V RIGHT COMPARISON:  None. FINDINGS: There is no evidence of hip fracture or dislocation. There is no evidence of focal bone abnormality. There are mild-to-moderate osteoarthritic changes of bilateral hips with joint space narrowing, mild subchondral sclerosis and small osteophytes. Vascular calcifications are noted. Moderate amount of formed stool in the rectum. Soft tissues are otherwise normal. IMPRESSION: No evidence of fracture of the hips or pelvis. Mild to moderate osteoarthritic changes of bilateral hips. Electronically Signed   By: Fidela Salisbury M.D.   On: 04/25/2015 09:31      Active Problems:   UTI (lower urinary tract infection)   Urinary tract infection   Assessment/Plan 1. Urinary Tract Infection: s/p 1g Ceftriaxone given in ED will continue IV antibiotics.  Will need to follow up urine culture. Monitor vital signs closely. Morphine 4mg  IV q2h PRN pain 2. CAD, ischemic CMP and apical aneurysm with LV thrombus: continue coreg, lasix, lipitor, cozaar, spironolactone, warfarin (INR therapeutic at 2.5) 3. VTE: patient therapeutic on warfarin 4. Diet: regular diet  Code Status: full code  DVT prophylaxis:on warfarin and therapeutic Family Communication: patient daughter bedside and all questions answered Disposition Plan/Anticipated LOS: admitted to telemetry  Time spent: 63 minutes  Coralie Common, MD  Resident Physician  04/25/2015, 1:28 PM

## 2015-04-26 ENCOUNTER — Encounter: Payer: Self-pay | Admitting: *Deleted

## 2015-04-26 ENCOUNTER — Encounter (HOSPITAL_COMMUNITY): Payer: Self-pay | Admitting: Internal Medicine

## 2015-04-26 ENCOUNTER — Other Ambulatory Visit (HOSPITAL_COMMUNITY): Payer: Self-pay | Admitting: *Deleted

## 2015-04-26 ENCOUNTER — Other Ambulatory Visit: Payer: Self-pay | Admitting: *Deleted

## 2015-04-26 DIAGNOSIS — N183 Chronic kidney disease, stage 3 unspecified: Secondary | ICD-10-CM | POA: Diagnosis present

## 2015-04-26 LAB — BASIC METABOLIC PANEL
Anion gap: 9 (ref 5–15)
BUN: 37 mg/dL — ABNORMAL HIGH (ref 6–20)
CHLORIDE: 105 mmol/L (ref 101–111)
CO2: 29 mmol/L (ref 22–32)
CREATININE: 1.68 mg/dL — AB (ref 0.44–1.00)
Calcium: 9.2 mg/dL (ref 8.9–10.3)
GFR, EST AFRICAN AMERICAN: 30 mL/min — AB (ref >60)
GFR, EST NON AFRICAN AMERICAN: 26 mL/min — AB (ref >60)
Glucose, Bld: 135 mg/dL — ABNORMAL HIGH (ref 65–99)
Potassium: 4.1 mmol/L (ref 3.5–5.1)
SODIUM: 143 mmol/L (ref 135–145)

## 2015-04-26 LAB — CBC
HCT: 34.3 % — ABNORMAL LOW (ref 36.0–46.0)
HEMOGLOBIN: 11 g/dL — AB (ref 12.0–15.0)
MCH: 32.2 pg (ref 26.0–34.0)
MCHC: 32.1 g/dL (ref 30.0–36.0)
MCV: 100.3 fL — ABNORMAL HIGH (ref 78.0–100.0)
PLATELETS: 168 10*3/uL (ref 150–400)
RBC: 3.42 MIL/uL — AB (ref 3.87–5.11)
RDW: 12 % (ref 11.5–15.5)
WBC: 7.4 10*3/uL (ref 4.0–10.5)

## 2015-04-26 LAB — GLUCOSE, CAPILLARY: Glucose-Capillary: 134 mg/dL — ABNORMAL HIGH (ref 65–99)

## 2015-04-26 LAB — PROTIME-INR
INR: 1.55 — AB (ref 0.00–1.49)
PROTHROMBIN TIME: 18.6 s — AB (ref 11.6–15.2)

## 2015-04-26 MED ORDER — WARFARIN SODIUM 5 MG PO TABS
2.5000 mg | ORAL_TABLET | Freq: Once | ORAL | Status: AC
  Administered 2015-04-26: 2.5 mg via ORAL
  Filled 2015-04-26: qty 1

## 2015-04-26 MED ORDER — INSULIN ASPART 100 UNIT/ML ~~LOC~~ SOLN
0.0000 [IU] | Freq: Three times a day (TID) | SUBCUTANEOUS | Status: DC
  Administered 2015-04-27: 1 [IU] via SUBCUTANEOUS
  Administered 2015-04-27: 2 [IU] via SUBCUTANEOUS

## 2015-04-26 MED ORDER — CALCIUM CARBONATE ANTACID 500 MG PO CHEW
2.0000 | CHEWABLE_TABLET | Freq: Every day | ORAL | Status: DC | PRN
  Administered 2015-04-26: 200 mg via ORAL
  Filled 2015-04-26: qty 2

## 2015-04-26 MED ORDER — ONDANSETRON HCL 4 MG/2ML IJ SOLN: 4.0000 mg | Freq: Four times a day (QID) | INTRAMUSCULAR | Status: DC | PRN

## 2015-04-26 MED ORDER — SODIUM CHLORIDE 0.9 % IV SOLN
INTRAVENOUS | Status: DC
  Administered 2015-04-26 – 2015-04-27 (×2): via INTRAVENOUS

## 2015-04-26 NOTE — Progress Notes (Signed)
[facility] Indication: VTE / LV Thrombus  Allergies  Allergen Reactions  . Codeine Nausea And Vomiting   Patient Measurements: Height: 4\' 11"  (149.9 cm) Weight: 140 lb (63.504 kg) IBW/kg (Calculated) : 43.2  Vital Signs: Temp: 97.7 F (36.5 C) (02/02 0800) Temp Source: Oral (02/02 0800) BP: 138/56 mmHg (02/02 0800) Pulse Rate: 64 (02/02 0800)  Labs:  Recent Labs  04/25/15 0830 04/25/15 1445 04/26/15 0613  HGB 12.3  --  11.0*  HCT 37.5  --  34.3*  PLT 180  --  168  LABPROT  --  17.7* 18.6*  INR  --  1.45 1.55*  CREATININE 1.57*  --  1.68*   Estimated Creatinine Clearance: 19.1 mL/min (by C-G formula based on Cr of 1.68).  Medical History: Past Medical History  Diagnosis Date  . Pure hypercholesterolemia   . Arthritis     severe lower back pain  . Other specified forms of chronic ischemic heart disease     echo (1/10) with EF 25-30%, mildly dilated LV, periapical LV aneueysm, calcified LV thombus, moderate MR. Medtronic ICD set to VVI.   Marland Kitchen CAD (coronary artery disease)     anterior MI in 2006 with Cypher DES to LAD  . HTN (hypertension)   . LV (left ventricular) mural thrombus (HCC)     on coumadin   . Nephrolithiasis   . CHF (congestive heart failure) ([place])   . Cardiomyopathy   . OA (osteoarthritis) of hip   . Osteoporosis   . On home oxygen therapy     at night  . MI, old    Medications:  Prescriptions prior to admission  Medication Sig Dispense Refill Last Dose  . acetaminophen (TYLENOL) 325 MG tablet Take 325-650 mg by mouth every 6 (six) hours as needed for mild pain.    Past Week at Unknown time  . atorvastatin (LIPITOR) 40 MG tablet Take 1 tablet (40 mg total) by mouth daily at 6 PM. 30 tablet 4 04/24/2015 at Unknown time  . carvedilol (COREG) 25 MG tablet TAKE 1 TABLET (25 MG TOTAL) BY MOUTH 2 (TWO) TIMES DAILY. 180 tablet 0 04/24/2015 at Unknown time  . cholecalciferol (VITAMIN D) 1000 UNITS tablet  Take 1,000 Units by mouth daily.     04/24/2015 at Unknown time  . folic acid (FOLVITE) 1 MG tablet Take 1 mg by mouth daily.   04/24/2015 at Unknown time  . furosemide (LASIX) 40 MG tablet Take 1 tablet (40 mg total) by mouth 2 (two) times daily. TAKE 60 tablet 5 04/24/2015 at Unknown time  . losartan (COZAAR) 25 MG tablet TAKE 1 TABLET (25 MG TOTAL) BY MOUTH DAILY. 90 tablet 0 04/24/2015 at Unknown time  . meclizine (ANTIVERT) 25 MG tablet Take 25 mg by mouth 3 (three) times daily as needed for dizziness.   Past Month at Unknown time  . nitroGLYCERIN (NITROSTAT) 0.4 MG SL tablet Place 1 tablet (0.4 mg total) under the tongue every 5 (five) minutes as needed for chest pain. 25 tablet 3 unknown  . pantoprazole (PROTONIX) 40 MG tablet Take 1 tablet (40 mg total) by mouth daily. 30 tablet 2 04/24/2015 at Unknown time  . saxagliptin HCl (ONGLYZA) 2.5 MG TABS tablet Take 2.5 mg by mouth daily.   04/24/2015 at Unknown time  . sertraline (ZOLOFT) 50 MG tablet Take 50 mg by mouth daily.    04/24/2015 at Unknown time  . spironolactone (ALDACTONE) 25 MG tablet TAKE ONE TABLET  BY MOUTH ONE TIME DAILY 90 tablet 1 04/24/2015 at Unknown time  . traMADol (ULTRAM) 50 MG tablet Take 50 mg by mouth every 6 (six) hours as needed for moderate pain.    Past Week at Unknown time  . trimethoprim (TRIMPEX) 100 MG tablet Take 100 mg by mouth daily.    04/24/2015 at Unknown time  . warfarin (COUMADIN) 2.5 MG tablet Take 1.25-2.5 mg by mouth daily. Take 1 tablet everyday except on Monday, Wednesday,and Friday ( take 1/2 tablet those days)   04/24/2015 at Unknown time   Assessment: Okay for Protocol, INR below goal.  Patient has already received 2.5mg  dose today (usual Wednesday dose is 1.25mg ). INR still subtherapeutic but trending ip.  Will continue with 2.5mg  dose today.   Goal of Therapy:  INR 2-3   Plan:  Coumadin 2.5mg  today. Daily PT/INR. Monitor for signs and symptoms of bleeding.   Isac Sarna, BS Vena [place],  BCPS Clinical Pharmacist Pager [phone] 04/26/2015,11:31 AM

## 2015-04-26 NOTE — Consult Note (Signed)
   [facility] Inpatient Consult   04/26/2015  [name] Oct 30, 1925 [id number]  Spoke with patient at bedside regarding the restart of services with [name]. Patient verbalized interest in [name] services. Patient signed consent form and packet with information given for disease management support or other services. Patient will receive post hospital follow up calls and be assessed for home visits.  Of note, [facility] services does not replace or interfere with any services that are arranged by inpatient case management or social work. For questions, please contact: [name]. [name], RN, BSN, [facility]iaison [phone]

## 2015-04-26 NOTE — Progress Notes (Signed)
[facility] PROGRESS NOTE  [name] BOWAN [id number] DOB: [birth year] [name]: 04/25/2015 PCP: [name], MD  Assessment/Plan: 1. Urinary Tract Infection: Continue 1g Ceftriaxone q24. Urine culture showing >100,000 colonies of GNR. Monitor vital signs closely. Morphine 4mg  IV q2h PRN pain, Tylenol for pain. 2. Osteoarthritis/ DJD: patient receiving Tylenol as well as a low dose prednisone.  Increased movement today with decreased pain.  PT eval ordered 3. CAD, ischemic CMP and apical aneurysm with LV thrombus: continue coreg, lasix, lipitor, cozaar, spironolactone, warfarin (INR subtherapeutic and pharmacy is dosing) 4. VTE: patient subtherapeutic on warfarin- pharmacy to redose 5. Diet: regular diet 6. Fecal impaction: resolved  Code Status: full Family Communication: no family at bedside Disposition Plan: home in 1-2 days pending urine culture results   Consultants:  none  Procedures:  none  Antibiotics:  Ceftriaxone 1g q24>> started 04/25/15 (indicate start date, and stop date if known)  HPI/Subjective: Patient is a pleasant 82 year old female with PMH of CAD, ischemic CMP and apical aneurysm with LV thrombus admitted for urinary tract infection and weakness. Patient reports some improvement today in terms of increased movement of her legs with less pain.  She was unaware that she needed to ask for pain medication and state she will be more on top of asking for pain medication when her pain begins to increase.  She did report significant BM's last night after her enema.  Reports improvement in abdominal pain since the enema.  Objective: Filed Vitals:   04/26/15 0800 04/26/15 1418  BP: 138/56 141/68  Pulse: 64 74  Temp: 97.7 F (36.5 C) 97.9 F (36.6 C)  Resp:  20    Intake/Output Summary (Last 24 hours) at 04/26/15 1630 Last data filed at 04/26/15 1300  Gross per 24 hour  Intake    480 ml  Output   1750 ml  Net  -1270 ml   Filed Weights   04/25/15 0803   Weight: 63.504 kg (140 lb)    Exam:   General:  No acute distress, watching television and laying comfortably in hospital bed  Cardiovascular: [id number], II/VI systolic murmur heard best at aortic and pulmonic areas, 2+ peripheral pulses radial and DP b/l  Respiratory: clear to auscultation bilaterally, no wheezing, rhonchi or rales  Abdomen: soft, nontender, nondistended, bowel sounds throughout  Musculoskeletal: improved ROM today compared to admission. Patient able to flex at the hip without significant pain and lift leg straight up to 20 degrees, power intact on flexion at hip as well as abduction and adduction and plantar/dorsiflexion of foot. Still tender on posterior lateral pelvis   Data Reviewed: Basic Metabolic Panel:  Recent Labs Lab 04/25/15 0830 04/26/15 0613  NA 141 143  K 4.3 4.1  CL 101 105  CO2 30 29  GLUCOSE 112* 135*  BUN 39* 37*  CREATININE 1.57* 1.68*  CALCIUM 9.6 9.2   Liver Function Tests:  Recent Labs Lab 04/25/15 0830  AST 26  ALT 18  ALKPHOS 60  BILITOT 0.8  PROT 7.8  ALBUMIN 4.2   No results for input(s): LIPASE, AMYLASE in the last 168 hours. No results for input(s): AMMONIA in the last 168 hours. CBC:  Recent Labs Lab 04/25/15 0830 04/26/15 0613  WBC 5.1 7.4  NEUTROABS 3.4  --   HGB 12.3 11.0*  HCT 37.5 34.3*  MCV 99.5 100.3*  PLT 180 168   Cardiac Enzymes: No results for input(s): CKTOTAL, CKMB, CKMBINDEX, TROPONINI in the last 168 hours. BNP (last 3 results)  Recent Labs  01/30/15 1033 03/13/15 1030  BNP 193.3* 368.9*    ProBNP (last 3 results) No results for input(s): PROBNP in the last 8760 hours.  CBG: No results for input(s): GLUCAP in the last 168 hours.  Recent Results (from the past 240 hour(s))  Urine culture     Status: None (Preliminary result)   Collection Time: 04/25/15  8:47 AM  Result Value Ref Range Status   Specimen Description URINE, CLEAN CATCH  Final   Special Requests NONE  Final    Culture   Final    >=100,000 COLONIES/mL GRAM NEGATIVE RODS CULTURE REINCUBATED FOR BETTER GROWTH Performed at [facility]    Report Status PENDING  Incomplete     Studies: Dg Lumbar Spine Complete  04/25/2015  CLINICAL DATA:  Severe low back and bilateral leg pain for 1 week rendering the patient unable to walk. Initial encounter. No known injury. EXAM: LUMBAR SPINE - COMPLETE 4+ VIEW COMPARISON:  Plain films lumbar spine 06/29/2014. CT abdomen and pelvis 09/11/2010. FINDINGS: Vertebral body height is maintained. There is marked convex right scoliosis. Severe multilevel loss of disc space height and facet degenerative disease are seen. The appearance is not markedly changed compared to the prior plain films. Bones are osteopenic. Large stool ball in the rectum is noted. IMPRESSION: No acute abnormality. Marked convex right scoliosis and multilevel degenerative change. Large stool ball in the rectum. Electronically Signed   By: Inge Rise M.D.   On: 04/25/2015 09:28   Dg Hip Unilat With Pelvis 2-3 Views Right  04/25/2015  CLINICAL DATA:  Back pain and bilateral hip pain. EXAM: DG HIP (WITH OR WITHOUT PELVIS) 2-3V RIGHT COMPARISON:  None. FINDINGS: There is no evidence of hip fracture or dislocation. There is no evidence of focal bone abnormality. There are mild-to-moderate osteoarthritic changes of bilateral hips with joint space narrowing, mild subchondral sclerosis and small osteophytes. Vascular calcifications are noted. Moderate amount of formed stool in the rectum. Soft tissues are otherwise normal. IMPRESSION: No evidence of fracture of the hips or pelvis. Mild to moderate osteoarthritic changes of bilateral hips. Electronically Signed   By: Fidela Salisbury M.D.   On: 04/25/2015 09:31    Scheduled Meds: . acetaminophen  325-650 mg Oral 4 times per day  . atorvastatin  40 mg Oral q1800  . carvedilol  25 mg Oral BID WC  . cefTRIAXone (ROCEPHIN) IVPB 1 gram/50 mL D5W  1 g  Intravenous Q24H  . furosemide  40 mg Oral BID  . linagliptin  5 mg Oral Daily  . losartan  25 mg Oral Daily  . nystatin cream   Topical BID  . pantoprazole  40 mg Oral Daily  . polyethylene glycol  17 g Oral Daily  . predniSONE  10 mg Oral BID WC  . sertraline  50 mg Oral Daily  . sodium chloride flush  3 mL Intravenous Q12H  . spironolactone  25 mg Oral Daily  . Warfarin - Pharmacist Dosing Inpatient   Does not apply Q24H   Continuous Infusions: . sodium chloride 50 mL/hr at 04/26/15 1554    Principal Problem:   Urinary tract infection Active Problems:   Long term (current) use of anticoagulants   Systolic CHF, chronic (HCC)   LV (left ventricular) mural thrombus (HCC)   Cardiomyopathy, ischemic   Fecal impaction (HCC)   Degenerative joint disease    Time spent: 30 minutes    Coralie Common  Resident Physician  [facility] 04/26/2015, 4:30  PM  LOS: 1 day

## 2015-04-27 LAB — BASIC METABOLIC PANEL WITH GFR
Anion gap: 9 (ref 5–15)
BUN: 41 mg/dL — ABNORMAL HIGH (ref 6–20)
CO2: 30 mmol/L (ref 22–32)
Calcium: 8.9 mg/dL (ref 8.9–10.3)
Chloride: 102 mmol/L (ref 101–111)
Creatinine, Ser: 1.38 mg/dL — ABNORMAL HIGH (ref 0.44–1.00)
GFR calc Af Amer: 39 mL/min — ABNORMAL LOW
GFR calc non Af Amer: 33 mL/min — ABNORMAL LOW
Glucose, Bld: 140 mg/dL — ABNORMAL HIGH (ref 65–99)
Potassium: 4.1 mmol/L (ref 3.5–5.1)
Sodium: 141 mmol/L (ref 135–145)

## 2015-04-27 LAB — GLUCOSE, CAPILLARY
GLUCOSE-CAPILLARY: 120 mg/dL — AB (ref 65–99)
GLUCOSE-CAPILLARY: 135 mg/dL — AB (ref 65–99)
GLUCOSE-CAPILLARY: 172 mg/dL — AB (ref 65–99)
GLUCOSE-CAPILLARY: 141 mg/dL — AB (ref 65–99)

## 2015-04-27 MED ORDER — WARFARIN SODIUM 2 MG PO TABS
3.0000 mg | ORAL_TABLET | Freq: Once | ORAL | Status: AC
  Administered 2015-04-27: 3 mg via ORAL
  Filled 2015-04-27: qty 1

## 2015-04-27 NOTE — Evaluation (Signed)
Physical Therapy Evaluation Patient Details Name: [name] MRN: [id number] DOB: Nov 15, 1925 Today's Date: 04/27/2015   History of Present Illness  82yo white female who comes in to [facility] after sudden onset of severe L sided hip pain. At baseline, pt lives alone at home, on chronic pain meds for chronic back pain, as she reports she has pain all over. At evaluation, pain is managed beter with meds, but still spikes 33/00 with certain movements. Xrays of pelvis and hips are unremarkable upon admission. Pt reports that she was recently diagnosed with 'the sugars,' and that her macular degeneration is beginning to affect her daily indep.   Clinical Impression  Pt is from home, mostly indep with ADL, but has help from daughter with IADL and transportation. Pt reports mobility deficits, impaired activity tolerance, chronic pain, and poor tolerance to postural changes at baseline, all of which are mildly worse today due to acute onset of new pain. While pain is managed medically, patient is able to tolerate near baseline mobility with severe pain, but remains limited by estimated 10-20%. Pt is safe for DC home with 24 hour assistance and HHPT services. Pt will benefit from skilled PT intervention to address the aforementioned deficits, impairments, and limitations and to restore the patient's baseline functional indep. As there is no clear etiology of her new CC at this time, additional diagnostics are recommended prior to continuance of PT treatment. I am recommending ortho consult to further address this new c/o of severe pain. All additional PT services can be met at the next venue of care, or until additional information is available regarding the origination of her chief complaint.     Follow Up Recommendations Home health PT;Supervision/Assistance - 24 hour    Equipment Recommendations  None recommended by PT    Recommendations for Other Services       Precautions / Restrictions  Precautions Precautions: None Restrictions Weight Bearing Restrictions: No      Mobility  Bed Mobility Overal bed mobility: Needs Assistance Bed Mobility: Supine to Sit;Sit to Supine     Supine to sit: Mod assist Sit to supine: Min assist   General bed mobility comments: This is most pain provoking, but patient does not sleep in bed at home due to what is described as and sounds consistent with chronic spinal stenosis related leg cramps with supine.   Transfers Overall transfer level: Needs assistance Equipment used: None Transfers: Sit to/from Stand Sit to Stand: Supervision;Modified independent (Device/Increase time)         General transfer comment: pain comes to standing to wipe self after toiletting with ease in effort and LUE support on BSC arm rest.   Ambulation/Gait Ambulation/Gait assistance: Min guard Ambulation Distance (Feet): 60 Feet (distance limted by isolation precautions. ) Assistive device: Rolling walker (2 wheeled)   Gait velocity: Pt reports as near baseline, but with more pain.  Gait velocity interpretation: <1.8 ft/sec, indicative of risk for recurrent falls General Gait Details: slow and cautious, alternating AMB and retroAMB as cued verbally.   Stairs            Wheelchair Mobility    Modified Rankin (Stroke Patients Only)       Balance Overall balance assessment: No apparent balance deficits (not formally assessed);Modified Independent;History of Falls  Pertinent Vitals/Pain Pain Assessment: 0-10 Pain Score: 4  Pain Location: L posterior hip near the insertion of the hamstrings on the ischial tuberosity; intemrittent R sided posterior hip pain and low back pain (10/10) with sudden positional changes.  Pain Descriptors / Indicators:  (It's just really awful. )    Home Living Family/patient expects to be discharged to:: Private residence Living Arrangements:  Alone Available Help at Discharge: Family;Available PRN/intermittently (daughter drives in from [facility] daily. ) Type of Home: House Home Access: Stairs to enter Entrance Stairs-Rails: None Entrance Stairs-Number of Steps: 1 Home Layout: One level Home Equipment: Cane - single point;Walker - 2 wheels      Prior Function Level of Independence: Needs assistance   Gait / Transfers Assistance Needed: limited community distances with RW, but mostly limited to Household ambulation due to decreased activitry tolerance and falls anxiety.   ADL's / Homemaking Assistance Needed: Needs assistance with home making and IADL; moderately progressed visual impairments due to MD.         Hand Dominance        Extremity/Trunk Assessment   Upper Extremity Assessment: Generalized weakness;Overall WFL for tasks assessed           Lower Extremity Assessment: Overall WFL for tasks assessed      Cervical / Trunk Assessment:  (antalgic posturing. )  Communication   Communication: No difficulties  Cognition Arousal/Alertness: Awake/alert Behavior During Therapy: WFL for tasks assessed/performed Overall Cognitive Status: Within Functional Limits for tasks assessed                      General Comments      Exercises        Assessment/Plan    PT Assessment All further PT needs can be met in the next venue of care  PT Diagnosis Difficulty walking;Abnormality of gait;Acute pain   PT Problem List Pain;Decreased activity tolerance;Decreased mobility;Decreased balance  PT Treatment Interventions     PT Goals (Current goals can be found in the Care Plan section) Acute Rehab PT Goals Patient Stated Goal: Resolve pain and return to home.  PT Goal Formulation: With patient Time For Goal Achievement: 05/10/15 Potential to Achieve Goals: Fair    Frequency     Barriers to discharge        Co-evaluation               End of Session Equipment Utilized During  Treatment: Gait belt Activity Tolerance: Patient tolerated treatment well;Patient limited by pain Patient left: in bed;with call bell/phone within reach;with family/visitor present Nurse Communication: Mobility status;Other (comment)         Time: 6840-3353 PT Time Calculation (min) (ACUTE ONLY): 14 min   Charges:   PT Evaluation $PT Eval Moderate Complexity: 1 Procedure PT Treatments $Therapeutic Activity: 8-22 mins   PT G Codes:       1:30 PM, 05-12-15 Etta Grandchild, PT, DPT PRN Physical Therapist at [facility] License # [id number] [phone] (wireless)  [phone] (mobile)

## 2015-04-27 NOTE — Progress Notes (Signed)
Physical Therapy Treatment Patient Details Name: [name] MRN: [id number] DOB: 21-Jun-1925 Today's Date: 04/27/2015    History of Present Illness 82yo white female who comes in to [facility] after sudden onset of severe L sided hip pain. At baseline, pt lives alone at home, on chronic pain meds for chronic back pain, as she reports she has pain all over. At evaluation, pain is managed beter with meds, but still spikes [id number] with certain movements. Xrays of pelvis and hips are unremarkable upon admission. Pt reports that she was recently diagnosed with 'the sugars,' and that her macular degeneration is beginning to affect her daily indep.     PT Comments    Pt has had a significant decrease in generalized pain with administration of Prednisone.  We offered our services and she was anxious to get up and walk even though she had recently returned to bed from having been up this AM.  Pt's transfer ability is improved and without antalgia.  She is able to stand to a walker and ambulate 200' with a very stable gait pattern and no significant pain.  She is hoping to go home tomorrow and I have confirmed with her that family will provide close supervision.  Follow Up Recommendations  Home health PT;Supervision/Assistance - 24 hour     Equipment Recommendations  None recommended by PT    Recommendations for Other Services  none     Precautions / Restrictions Precautions Precautions: None Restrictions Weight Bearing Restrictions: No    Mobility  Bed Mobility Overal bed mobility: Needs Assistance Bed Mobility: Supine to Sit;Sit to Supine     Supine to sit: Supervision;HOB elevated Sit to supine: Min assist   General bed mobility comments: This is most pain provoking, but patient does not sleep in bed at home due to what is described as and sounds consistent with chronic spinal stenosis related leg cramps with supine.   Transfers Overall transfer level: Needs assistance Equipment used:  Rolling walker (2 wheeled) Transfers: Sit to/from Stand Sit to Stand: Supervision         General transfer comment: no pain with transfer  Ambulation/Gait Ambulation/Gait assistance: Supervision Ambulation Distance (Feet): 200 Feet Assistive device: Rolling walker (2 wheeled) Gait Pattern/deviations: WFL(Within Functional Limits);Trunk flexed Gait velocity: Pt reports as near baseline, but with more pain.  Gait velocity interpretation: <1.8 ft/sec, indicative of risk for recurrent falls General Gait Details: no pain with gait   Stairs            Wheelchair Mobility    Modified Rankin (Stroke Patients Only)       Balance Overall balance assessment: No apparent balance deficits (not formally assessed)                                  Cognition Arousal/Alertness: Awake/alert Behavior During Therapy: WFL for tasks assessed/performed Overall Cognitive Status: Within Functional Limits for tasks assessed                      Exercises      General Comments        Pertinent Vitals/Pain Pain Assessment: No/denies pain Pain Score: 4  Pain Location: L posterior hip near the insertion of the hamstrings on the ischial tuberosity; intemrittent R sided posterior hip pain and low back pain (10/10) with sudden positional changes.  Pain Descriptors / Indicators:  (It's just really awful. )  Home Living Family/patient expects to be discharged to:: Private residence Living Arrangements: Alone Available Help at Discharge: Family;Available PRN/intermittently (daughter drives in from [facility] daily. ) Type of Home: House Home Access: Stairs to enter Entrance Stairs-Rails: None Home Layout: One level Home Equipment: Cane - single point;Walker - 2 wheels      Prior Function Level of Independence: Needs assistance  Gait / Transfers Assistance Needed: limited community distances with RW, but mostly limited to Household ambulation due to decreased  activitry tolerance and falls anxiety.  ADL's / Homemaking Assistance Needed: Needs assistance with home making and IADL; moderately progressed visual impairments due to MD.      PT Goals (current goals can now be found in the care plan section) Acute Rehab PT Goals Patient Stated Goal: Resolve pain and return to home.  PT Goal Formulation: With patient Time For Goal Achievement: 05/10/15 Potential to Achieve Goals: Fair Progress towards PT goals: Progressing toward goals    Frequency  Min 3X/week    PT Plan Current plan remains appropriate    Co-evaluation             End of Session Equipment Utilized During Treatment: Gait belt Activity Tolerance: Patient tolerated treatment well;No increased pain Patient left: in bed;with call bell/phone within reach;with family/visitor present     Time: [id number] PT Time Calculation (min) (ACUTE ONLY): 25 min  Charges:  $Gait Training: 8-22 mins $Therapeutic Activity: 8-22 mins                    G CodesSable Feil  PT 04/27/2015, 2:08 PM [id number]

## 2015-04-27 NOTE — Progress Notes (Signed)
[facility] PROGRESS NOTE  [name] [id number] DOB: 02-21-1926 [name]: 04/25/2015 PCP: [name], MD  Assessment/Plan: 1. Urinary Tract Infection: Continue 1g Ceftriaxone q24. Urine culture showing >100,000 colonies of GNR, sensitivities not back yet- once they are we can transition her to oral antibiotics. Monitor vital signs closely- not hypotensive, tachycardic. Morphine 4mg  IV q2h PRN pain,  And home dose tramadol for pain.  2. Osteoarthritis/ DJD: on low dose prednisone. PT evaluated patient; nursing staff encouraged to get patient out of bed to chair today 3. CAD, ischemic CMP and apical aneurysm with LV thrombus: continue coreg, lasix, lipitor, cozaar, spironolactone, warfarin (INR still subtherapeutic and pharmacy is dosing).  Patient creatinine improved today and her blood pressures have been well controlled throughout admission- may still want to consider decreasing doses of home medications outpatient 4. VTE: patient subtherapeutic on warfarin- pharmacy to redose 5. Diet: regular diet 6. Fecal impaction: patient last BM yesterday, received miralax yesterday and today  Code Status: full Family Communication: nephew at bedside Disposition Plan: home in 1-2 days pending urine culture sensitivities   Consultants:  none  Procedures:  none  Antibiotics: Ceftriaxone 1g q24>> started 04/25/15   HPI/Subjective: Patient is a pleasant 82 year old female with PMH of CAD, ischemic CMP and apical aneurysm with LV thrombus admitted for urinary tract infection and weakness. Reports some increased stiffness this morning but improved movement.  Did still need assistance getting up this morning from bed but walked yesterday with PT to the door and then backwards to her bed.  Patient ROM improved.  Only asked for morphine 3x yesterday. Objective: Filed Vitals:   04/26/15 2042 04/27/15 0619  BP: 127/56 140/64  Pulse: 65 63  Temp: 98.3 F (36.8 C) 97.6 F (36.4 C)  Resp: 20 18     Intake/Output Summary (Last 24 hours) at 04/27/15 0947 Last data filed at 04/27/15 [id number]  Gross per 24 hour  Intake 1214.67 ml  Output   1000 ml  Net 214.67 ml   Filed Weights   04/25/15 0803  Weight: 63.504 kg (140 lb)    Exam:   General:  No acute distress, visiting with her nephew  Cardiovascular: [id number], II/VI systolic murmur heard best at aortic and pulmonic areas, 2+ peripheral pulses radial and DP b/l  Respiratory: clear to auscultation bilaterally, no wheezing, rhonchi or rales  Abdomen: soft, nontender, nondistended, bowel sounds throughout  Musculoskeletal: flexion at the hip without significant pain and lift leg straight up to 50 degrees, power intact on flexion at hip as well as abduction and adduction and plantar/dorsiflexion of foot. Still tender on posterior lateral pelvis bilaterally   Data Reviewed: Basic Metabolic Panel:  Recent Labs Lab 04/25/15 0830 04/26/15 0613 04/27/15 0559  NA 141 143 141  K 4.3 4.1 4.1  CL 101 105 102  CO2 30 29 30   GLUCOSE 112* 135* 140*  BUN 39* 37* 41*  CREATININE 1.57* 1.68* 1.38*  CALCIUM 9.6 9.2 8.9   Liver Function Tests:  Recent Labs Lab 04/25/15 0830  AST 26  ALT 18  ALKPHOS 60  BILITOT 0.8  PROT 7.8  ALBUMIN 4.2   No results for input(s): LIPASE, AMYLASE in the last 168 hours. No results for input(s): AMMONIA in the last 168 hours. CBC:  Recent Labs Lab 04/25/15 0830 04/26/15 0613  WBC 5.1 7.4  NEUTROABS 3.4  --   HGB 12.3 11.0*  HCT 37.5 34.3*  MCV 99.5 100.3*  PLT 180 168   Cardiac Enzymes:  No results for input(s): CKTOTAL, CKMB, CKMBINDEX, TROPONINI in the last 168 hours. BNP (last 3 results)  Recent Labs  01/30/15 1033 03/13/15 1030  BNP 193.3* 368.9*    ProBNP (last 3 results) No results for input(s): PROBNP in the last 8760 hours.  CBG:  Recent Labs Lab 04/26/15 2031 04/27/15 0735  GLUCAP 134* 120*    Recent Results (from the past 240 hour(s))  Urine culture      Status: None (Preliminary result)   Collection Time: 04/25/15  8:47 AM  Result Value Ref Range Status   Specimen Description URINE, CLEAN CATCH  Final   Special Requests NONE  Final   Culture   Final    >=100,000 COLONIES/mL GRAM NEGATIVE RODS CULTURE REINCUBATED FOR BETTER GROWTH Performed at [facility]    Report Status PENDING  Incomplete     Studies: No results found.  Scheduled Meds: . acetaminophen  325-650 mg Oral 4 times per day  . atorvastatin  40 mg Oral q1800  . carvedilol  25 mg Oral BID WC  . cefTRIAXone (ROCEPHIN) IVPB 1 gram/50 mL D5W  1 g Intravenous Q24H  . furosemide  40 mg Oral BID  . insulin aspart  0-9 Units Subcutaneous TID WC  . losartan  25 mg Oral Daily  . nystatin cream   Topical BID  . pantoprazole  40 mg Oral Daily  . polyethylene glycol  17 g Oral Daily  . predniSONE  10 mg Oral BID WC  . sertraline  50 mg Oral Daily  . sodium chloride flush  3 mL Intravenous Q12H  . spironolactone  25 mg Oral Daily  . warfarin  3 mg Oral Once  . Warfarin - Pharmacist Dosing Inpatient   Does not apply Q24H   Continuous Infusions: . sodium chloride 70 mL/hr at 04/27/15 [id number]    Principal Problem:   Urinary tract infection Active Problems:   Long term (current) use of anticoagulants   Systolic CHF, chronic (HCC)   LV (left ventricular) mural thrombus (HCC)   Cardiomyopathy, ischemic   Fecal impaction (HCC)   Degenerative joint disease   CKD (chronic kidney disease) stage 3, GFR 30-59 ml/min    Time spent: 30 minutes    Coralie Common  Pager [phone] Resident Physician  [facility] 04/27/2015, 9:47 AM  LOS: 2 days

## 2015-04-27 NOTE — Care Management Note (Signed)
Case Management Note  Patient Details  Name: [name] MRN: [id number] Date of Birth: January 02, 1926  Subjective/Objective:            Spoke with patient and family. PT recommends Home Health PT.   No preference for home health providers referral placed with  [place].     Action/Plan:  Home with Home Health. Expected Discharge Date:                  Expected Discharge Plan:  [place]  In-House Referral:     Discharge planning Services  CM Consult  Post Acute Care Choice:    Choice offered to:  Patient  DME Arranged:    DME Agency:     HH Arranged:  RN, PT [place] Agency:  [place]  Status of Service:  Completed, signed off  Medicare Important Message Given:    Date Medicare IM Given:    Medicare IM give by:    Date Additional Medicare IM Given:    Additional Medicare Important Message give by:     If discussed at [place] of Stay Meetings, dates discussed:    Additional Comments:  Alvie Heidelberg, RN 04/27/2015, 2:49 PM

## 2015-04-27 NOTE — Progress Notes (Addendum)
[place] for Warfarin Indication: VTE / LV Thrombus  Allergies  Allergen Reactions  . Codeine Nausea And Vomiting   Patient Measurements: Height: 4\' 11"  (149.9 cm) Weight: 140 lb (63.504 kg) IBW/kg (Calculated) : 43.2  Vital Signs: Temp: 97.6 F (36.4 C) (02/03 0619) Temp Source: Oral (02/03 0619) BP: 140/64 mmHg (02/03 0619) Pulse Rate: 63 (02/03 0619)  Labs:  Recent Labs  04/25/15 0830 04/25/15 1445 04/26/15 0613 04/27/15 0559  HGB 12.3  --  11.0*  --   HCT 37.5  --  34.3*  --   PLT 180  --  168  --   LABPROT  --  17.7* 18.6*  --   INR  --  1.45 1.55*  --   CREATININE 1.57*  --  1.68* 1.38*   Estimated Creatinine Clearance: 23.3 mL/min (by C-G formula based on Cr of 1.38).  Medical History: Past Medical History  Diagnosis Date  . Pure hypercholesterolemia   . Arthritis     severe lower back pain  . Other specified forms of chronic ischemic heart disease     echo (1/10) with EF 25-30%, mildly dilated LV, periapical LV aneueysm, calcified LV thombus, moderate MR. Medtronic ICD set to VVI.   Mar[place] Kitchen CAD (coronary artery disease)     anterior MI in 2006 with Cypher DES to LAD  . HTN (hypertension)   . LV (left ventricular) mural thrombus (HCC)     on coumadin   . Nephrolithiasis   . CHF (congestive heart failure) ([place])   . Cardiomyopathy   . OA (osteoarthritis) of hip   . Osteoporosis   . On home oxygen therapy     at night  . MI, old   . CKD (chronic kidney disease) stage 3, GFR 30-59 ml/min     Stage III-stage IV   Medications:  Prescriptions prior to admission  Medication Sig Dispense Refill Last Dose  . acetaminophen (TYLENOL) 325 MG tablet Take 325-650 mg by mouth every 6 (six) hours as needed for mild pain.    Past Week at Unknown time  . atorvastatin (LIPITOR) 40 MG tablet Take 1 tablet (40 mg total) by mouth daily at 6 PM. 30 tablet 4 04/24/2015 at Unknown time  . carvedilol (COREG) 25 MG tablet TAKE 1 TABLET (25  MG TOTAL) BY MOUTH 2 (TWO) TIMES DAILY. 180 tablet 0 04/24/2015 at Unknown time  . cholecalciferol (VITAMIN D) 1000 UNITS tablet Take 1,000 Units by mouth daily.     04/24/2015 at Unknown time  . folic acid (FOLVITE) 1 MG tablet Take 1 mg by mouth daily.   04/24/2015 at Unknown time  . furosemide (LASIX) 40 MG tablet Take 1 tablet (40 mg total) by mouth 2 (two) times daily. TAKE 60 tablet 5 04/24/2015 at Unknown time  . losartan (COZAAR) 25 MG tablet TAKE 1 TABLET (25 MG TOTAL) BY MOUTH DAILY. 90 tablet 0 04/24/2015 at Unknown time  . meclizine (ANTIVERT) 25 MG tablet Take 25 mg by mouth 3 (three) times daily as needed for dizziness.   Past Month at Unknown time  . nitroGLYCERIN (NITROSTAT) 0.4 MG SL tablet Place 1 tablet (0.4 mg total) under the tongue every 5 (five) minutes as needed for chest pain. 25 tablet 3 unknown  . pantoprazole (PROTONIX) 40 MG tablet Take 1 tablet (40 mg total) by mouth daily. 30 tablet 2 04/24/2015 at Unknown time  . saxagliptin HCl (ONGLYZA) 2.5 MG TABS tablet Take 2.5 mg by mouth daily.  04/24/2015 at Unknown time  . sertraline (ZOLOFT) 50 MG tablet Take 50 mg by mouth daily.    04/24/2015 at Unknown time  . spironolactone (ALDACTONE) 25 MG tablet TAKE ONE TABLET BY MOUTH ONE TIME DAILY 90 tablet 1 04/24/2015 at Unknown time  . traMADol (ULTRAM) 50 MG tablet Take 50 mg by mouth every 6 (six) hours as needed for moderate pain.    Past Week at Unknown time  . trimethoprim (TRIMPEX) 100 MG tablet Take 100 mg by mouth daily.    04/24/2015 at Unknown time  . warfarin (COUMADIN) 2.5 MG tablet Take 1.25-2.5 mg by mouth daily. Take 1 tablet everyday except on Monday, Wednesday,and Friday ( take 1/2 tablet those days)   04/24/2015 at Unknown time   Assessment:  INR remains below goal but is trending up. Hg stable  Goal of Therapy:  INR 2-3   Plan:  Coumadin 3mg  today. Daily PT/INR. Monitor for signs and symptoms of bleeding.  Thanks for allowing pharmacy to be a part of this  patient's care.  Excell Seltzer, PharmD Clinical Pharmacist 04/27/2015,8:22 AM

## 2015-04-28 DIAGNOSIS — N183 Chronic kidney disease, stage 3 (moderate): Secondary | ICD-10-CM

## 2015-04-28 DIAGNOSIS — I5022 Chronic systolic (congestive) heart failure: Secondary | ICD-10-CM

## 2015-04-28 DIAGNOSIS — M150 Primary generalized (osteo)arthritis: Secondary | ICD-10-CM

## 2015-04-28 DIAGNOSIS — Z7901 Long term (current) use of anticoagulants: Secondary | ICD-10-CM

## 2015-04-28 DIAGNOSIS — K5641 Fecal impaction: Secondary | ICD-10-CM

## 2015-04-28 LAB — BASIC METABOLIC PANEL
Anion gap: 8 (ref 5–15)
BUN: 35 mg/dL — AB (ref 6–20)
CHLORIDE: 105 mmol/L (ref 101–111)
CO2: 29 mmol/L (ref 22–32)
Calcium: 8.9 mg/dL (ref 8.9–10.3)
Creatinine, Ser: 1.23 mg/dL — ABNORMAL HIGH (ref 0.44–1.00)
GFR, EST AFRICAN AMERICAN: 44 mL/min — AB (ref >60)
GFR, EST NON AFRICAN AMERICAN: 38 mL/min — AB (ref >60)
Glucose, Bld: 124 mg/dL — ABNORMAL HIGH (ref 65–99)
POTASSIUM: 3.9 mmol/L (ref 3.5–5.1)
SODIUM: 142 mmol/L (ref 135–145)

## 2015-04-28 LAB — URINE CULTURE

## 2015-04-28 LAB — PROTIME-INR
INR: 2.02 — AB (ref 0.00–1.49)
PROTHROMBIN TIME: 22.7 s — AB (ref 11.6–15.2)

## 2015-04-28 LAB — GLUCOSE, CAPILLARY: Glucose-Capillary: 103 mg/dL — ABNORMAL HIGH (ref 65–99)

## 2015-04-28 MED ORDER — FOSFOMYCIN TROMETHAMINE 3 G PO PACK
3.0000 g | PACK | Freq: Once | ORAL | Status: AC
  Administered 2015-04-28: 3 g via ORAL
  Filled 2015-04-28: qty 3

## 2015-04-28 MED ORDER — WARFARIN SODIUM 2.5 MG PO TABS: 1.2500 mg | ORAL_TABLET | Freq: Once | ORAL | Status: DC

## 2015-04-28 MED ORDER — PREDNISONE 10 MG PO TABS: 10.0000 mg | ORAL_TABLET | Freq: Two times a day (BID) | ORAL | Status: DC

## 2015-04-28 MED ORDER — POLYETHYLENE GLYCOL 3350 17 G PO PACK: 17.0000 g | PACK | Freq: Every day | ORAL | Status: AC

## 2015-04-28 NOTE — Progress Notes (Signed)
[place] for Warfarin Indication: VTE / LV Thrombus  Allergies  Allergen Reactions  . Codeine Nausea And Vomiting   Patient Measurements: Height: 4\' 11"  (149.9 cm) Weight: 140 lb (63.504 kg) IBW/kg (Calculated) : 43.2  Vital Signs: Temp: 97.7 F (36.5 C) (02/04 0500) Temp Source: Oral (02/04 0500) BP: 154/67 mmHg (02/04 0500) Pulse Rate: 73 (02/04 0500)  Labs:  Recent Labs  04/25/15 1445 04/26/15 [id number] 04/27/15 0559 04/28/15 0632 04/28/15 0936  HGB  --  11.0*  --   --   --   HCT  --  34.3*  --   --   --   PLT  --  168  --   --   --   LABPROT 17.7* 18.6*  --   --  22.7*  INR 1.45 1.55*  --   --  2.02*  CREATININE  --  1.68* 1.38* 1.23*  --    Estimated Creatinine Clearance: 26.1 mL/min (by C-G formula based on Cr of 1.23).  Medical History: Past Medical History  Diagnosis Date  . Pure hypercholesterolemia   . Arthritis     severe lower back pain  . Other specified forms of chronic ischemic heart disease     echo (1/10) with EF 25-30%, mildly dilated LV, periapical LV aneueysm, calcified LV thombus, moderate MR. Medtronic ICD set to VVI.   Marland Kitchen CAD (coronary artery disease)     anterior MI in 2006 with Cypher DES to LAD  . HTN (hypertension)   . LV (left ventricular) mural thrombus (HCC)     on coumadin   . Nephrolithiasis   . CHF (congestive heart failure) ([place])   . Cardiomyopathy   . OA (osteoarthritis) of hip   . Osteoporosis   . On home oxygen therapy     at night  . MI, old   . CKD (chronic kidney disease) stage 3, GFR 30-59 ml/min     Stage III-stage IV   Medications:  Prescriptions prior to admission  Medication Sig Dispense Refill Last Dose  . acetaminophen (TYLENOL) 325 MG tablet Take 325-650 mg by mouth every 6 (six) hours as needed for mild pain.    Past Week at Unknown time  . atorvastatin (LIPITOR) 40 MG tablet Take 1 tablet (40 mg total) by mouth daily at 6 PM. 30 tablet 4 04/24/2015 at Unknown time  .  carvedilol (COREG) 25 MG tablet TAKE 1 TABLET (25 MG TOTAL) BY MOUTH 2 (TWO) TIMES DAILY. 180 tablet 0 04/24/2015 at Unknown time  . cholecalciferol (VITAMIN D) 1000 UNITS tablet Take 1,000 Units by mouth daily.     04/24/2015 at Unknown time  . folic acid (FOLVITE) 1 MG tablet Take 1 mg by mouth daily.   04/24/2015 at Unknown time  . furosemide (LASIX) 40 MG tablet Take 1 tablet (40 mg total) by mouth 2 (two) times daily. TAKE 60 tablet 5 04/24/2015 at Unknown time  . losartan (COZAAR) 25 MG tablet TAKE 1 TABLET (25 MG TOTAL) BY MOUTH DAILY. 90 tablet 0 04/24/2015 at Unknown time  . meclizine (ANTIVERT) 25 MG tablet Take 25 mg by mouth 3 (three) times daily as needed for dizziness.   Past Month at Unknown time  . nitroGLYCERIN (NITROSTAT) 0.4 MG SL tablet Place 1 tablet (0.4 mg total) under the tongue every 5 (five) minutes as needed for chest pain. 25 tablet 3 unknown  . pantoprazole (PROTONIX) 40 MG tablet Take 1 tablet (40 mg total) by mouth daily.  30 tablet 2 04/24/2015 at Unknown time  . saxagliptin HCl (ONGLYZA) 2.5 MG TABS tablet Take 2.5 mg by mouth daily.   04/24/2015 at Unknown time  . sertraline (ZOLOFT) 50 MG tablet Take 50 mg by mouth daily.    04/24/2015 at Unknown time  . spironolactone (ALDACTONE) 25 MG tablet TAKE ONE TABLET BY MOUTH ONE TIME DAILY 90 tablet 1 04/24/2015 at Unknown time  . traMADol (ULTRAM) 50 MG tablet Take 50 mg by mouth every 6 (six) hours as needed for moderate pain.    Past Week at Unknown time  . trimethoprim (TRIMPEX) 100 MG tablet Take 100 mg by mouth daily.    04/24/2015 at Unknown time  . warfarin (COUMADIN) 2.5 MG tablet Take 1.25-2.5 mg by mouth daily. Take 1 tablet everyday except on Monday, Wednesday,and Friday ( take 1/2 tablet those days)   04/24/2015 at Unknown time   Assessment:  INR therapeutic.  No bleeding complications noted  Goal of Therapy:  INR 2-3   Plan:  Coumadin 1.25 mg today. Daily PT/INR. Monitor for signs and symptoms of bleeding.   Thanks for allowing pharmacy to be a part of this patient's care.  Excell Seltzer, PharmD Clinical Pharmacist 04/28/2015,11:00 AM

## 2015-04-28 NOTE — Progress Notes (Signed)
IV access removed.  Discharge instructions reviewed with patient, questions answered, understanding verbalized.

## 2015-04-28 NOTE — Discharge Summary (Signed)
Physician Discharge Summary  ROCIO [name] [id number] DOB: 10-20-1925 [name]: 04/25/2015  PCP: [name], MD  Admit date: 04/25/2015 Discharge date: 04/28/2015  Recommendations for Outpatient Follow-up:  1. Home health 2. Please follow up with PCP concerning renal function  Discharge Diagnoses:  1. Urinary Tract Infection 2. Degenerative Joint Disease 3. CKD Stage 3 4. Cardiomyopathy, ischemic 5. Long term use of anticoagulants 6. Fecal Impaction  Discharge Condition: stable Disposition: home with home health services  Diet recommendation: heart healthy  Filed Weights   04/25/15 0803  Weight: 63.504 kg (140 lb)    History of present illness:  82 year old female with PMH of CAD, ischemic CMP and apical aneurysm with LV thrombus admitted for urinary tract infection. Patient states this morning she awoke at 3am to use the restroom per her normal since she takes a water pill. She mentions that at this time she had no difficulty ambulating. She then awoke at 7am and states that due to significant and severe hip pain she could not ambulate. She has not had pain of this intensity before. She was brought into [facility] for further evaluation. She was given Dilaudid in the ED but this did not significantly decrease her pain. She was found to have an urinary tract infection on urinalysis (large LE, positive Nitrite, packed field bacteria and WBCs). Patient denies fevers, nausea, vomiting, urgency, frequency, dysuria, hematuria. Pain posterolateral lumbar spine and PSIS. Last BM was yesterday but prior to that patient states she experienced six days of constipation for which she took 2 Doses of miralax. Pain present on movement, does not radiate and is 10/10 at worst.  Hospital Course:  Patient was admitted and started on Ceftriaxone.  She was also started on a low dose prednisone for her DJD.  On admission her renal function was noted to be above normal ranges and IV fluids were  initiated at a low rate given patients heart disease.  She improved throughout the hospitalization and was seen and evaluated by PT.  It was suggested that home health be ordered for patient to improve her strength and mobility.  Urine culture grew ESBL and so 1 dose of fosfomycin was ordered for patient.  She was discharged home in improved condition with increased mobility compared to admission and decreased pain.  She will continue on a slow prednisone taper and was encouraged to follow up with her PCP concerning her renal function.   Discharge Instructions   Current Discharge Medication List    START taking these medications   Details  predniSONE (DELTASONE) 10 MG tablet Take 1 tablet (10 mg total) by mouth 2 (two) times daily with a meal. Qty: 10 tablet, Refills: 0      CONTINUE these medications which have NOT CHANGED   Details  acetaminophen (TYLENOL) 325 MG tablet Take 325-650 mg by mouth every 6 (six) hours as needed for mild pain.     atorvastatin (LIPITOR) 40 MG tablet Take 1 tablet (40 mg total) by mouth daily at 6 PM. Qty: 30 tablet, Refills: 4    carvedilol (COREG) 25 MG tablet TAKE 1 TABLET (25 MG TOTAL) BY MOUTH 2 (TWO) TIMES DAILY. Qty: 180 tablet, Refills: 0    cholecalciferol (VITAMIN D) 1000 UNITS tablet Take 1,000 Units by mouth daily.      folic acid (FOLVITE) 1 MG tablet Take 1 mg by mouth daily.    furosemide (LASIX) 40 MG tablet Take 1 tablet (40 mg total) by mouth 2 (two)  times daily. TAKE Qty: 60 tablet, Refills: 5   Associated Diagnoses: Chronic systolic heart failure (HCC)    losartan (COZAAR) 25 MG tablet TAKE 1 TABLET (25 MG TOTAL) BY MOUTH DAILY. Qty: 90 tablet, Refills: 0    meclizine (ANTIVERT) 25 MG tablet Take 25 mg by mouth 3 (three) times daily as needed for dizziness.    nitroGLYCERIN (NITROSTAT) 0.4 MG SL tablet Place 1 tablet (0.4 mg total) under the tongue every 5 (five) minutes as needed for chest pain. Qty: 25 tablet, Refills: 3      pantoprazole (PROTONIX) 40 MG tablet Take 1 tablet (40 mg total) by mouth daily. Qty: 30 tablet, Refills: 2    saxagliptin HCl (ONGLYZA) 2.5 MG TABS tablet Take 2.5 mg by mouth daily.    sertraline (ZOLOFT) 50 MG tablet Take 50 mg by mouth daily.     spironolactone (ALDACTONE) 25 MG tablet TAKE ONE TABLET BY MOUTH ONE TIME DAILY Qty: 90 tablet, Refills: 1    traMADol (ULTRAM) 50 MG tablet Take 50 mg by mouth every 6 (six) hours as needed for moderate pain.     warfarin (COUMADIN) 2.5 MG tablet Take 1.25-2.5 mg by mouth daily. Take 1 tablet everyday except on Monday, Wednesday,and Friday ( take 1/2 tablet those days)      STOP taking these medications     trimethoprim (TRIMPEX) 100 MG tablet        Allergies  Allergen Reactions  . Codeine Nausea And Vomiting    The results of significant diagnostics from this hospitalization (including imaging, microbiology, ancillary and laboratory) are listed below for reference.    Significant Diagnostic Studies: Dg Lumbar Spine Complete  04/25/2015  CLINICAL DATA:  Severe low back and bilateral leg pain for 1 week rendering the patient unable to walk. Initial encounter. No known injury. EXAM: LUMBAR SPINE - COMPLETE 4+ VIEW COMPARISON:  Plain films lumbar spine 06/29/2014. CT abdomen and pelvis 09/11/2010. FINDINGS: Vertebral body height is maintained. There is marked convex right scoliosis. Severe multilevel loss of disc space height and facet degenerative disease are seen. The appearance is not markedly changed compared to the prior plain films. Bones are osteopenic. Large stool ball in the rectum is noted. IMPRESSION: No acute abnormality. Marked convex right scoliosis and multilevel degenerative change. Large stool ball in the rectum. Electronically Signed   By: Inge Rise M.D.   On: 04/25/2015 09:28   Dg Hip Unilat With Pelvis 2-3 Views Right  04/25/2015  CLINICAL DATA:  Back pain and bilateral hip pain. EXAM: DG HIP (WITH OR WITHOUT  PELVIS) 2-3V RIGHT COMPARISON:  None. FINDINGS: There is no evidence of hip fracture or dislocation. There is no evidence of focal bone abnormality. There are mild-to-moderate osteoarthritic changes of bilateral hips with joint space narrowing, mild subchondral sclerosis and small osteophytes. Vascular calcifications are noted. Moderate amount of formed stool in the rectum. Soft tissues are otherwise normal. IMPRESSION: No evidence of fracture of the hips or pelvis. Mild to moderate osteoarthritic changes of bilateral hips. Electronically Signed   By: Fidela Salisbury M.D.   On: 04/25/2015 09:31    Microbiology: Recent Results (from the past 240 hour(s))  Urine culture     Status: None   Collection Time: 04/25/15  8:47 AM  Result Value Ref Range Status   Specimen Description URINE, CLEAN CATCH  Final   Special Requests NONE  Final   Culture   Final    >=100,000 COLONIES/mL ESCHERICHIA COLI Confirmed Extended Spectrum Beta-Lactamase  Producer (ESBL) Performed at [facility]    Report Status 04/28/2015 FINAL  Final   Organism ID, Bacteria ESCHERICHIA COLI  Final      Susceptibility   Escherichia coli - MIC*    AMPICILLIN >=32 RESISTANT Resistant     CEFAZOLIN >=64 RESISTANT Resistant     CEFTRIAXONE >=64 RESISTANT Resistant     CIPROFLOXACIN >=4 RESISTANT Resistant     GENTAMICIN <=1 SENSITIVE Sensitive     IMIPENEM <=0.25 SENSITIVE Sensitive     NITROFURANTOIN <=16 SENSITIVE Sensitive     TRIMETH/SULFA >=320 RESISTANT Resistant     AMPICILLIN/SULBACTAM 16 INTERMEDIATE Intermediate     PIP/TAZO <=4 SENSITIVE Sensitive     * >=100,000 COLONIES/mL ESCHERICHIA COLI     Labs: Basic Metabolic Panel:  Recent Labs Lab 04/25/15 0830 04/26/15 0613 04/27/15 0559 04/28/15 0632  NA 141 143 141 142  K 4.3 4.1 4.1 3.9  CL 101 105 102 105  CO2 30 29 30 29   GLUCOSE 112* 135* 140* 124*  BUN 39* 37* 41* 35*  CREATININE 1.57* 1.68* 1.38* 1.23*  CALCIUM 9.6 9.2 8.9 8.9   Liver  Function Tests:  Recent Labs Lab 04/25/15 0830  AST 26  ALT 18  ALKPHOS 60  BILITOT 0.8  PROT 7.8  ALBUMIN 4.2   No results for input(s): LIPASE, AMYLASE in the last 168 hours. No results for input(s): AMMONIA in the last 168 hours. CBC:  Recent Labs Lab 04/25/15 0830 04/26/15 0613  WBC 5.1 7.4  NEUTROABS 3.4  --   HGB 12.3 11.0*  HCT 37.5 34.3*  MCV 99.5 100.3*  PLT 180 168   Cardiac Enzymes: No results for input(s): CKTOTAL, CKMB, CKMBINDEX, TROPONINI in the last 168 hours. BNP: BNP (last 3 results)  Recent Labs  01/30/15 1033 03/13/15 1030  BNP 193.3* 368.9*    ProBNP (last 3 results) No results for input(s): PROBNP in the last 8760 hours.  CBG:  Recent Labs Lab 04/27/15 0735 04/27/15 1143 04/27/15 1851 04/27/15 2048 04/28/15 0813  GLUCAP 120* 135* 172* 141* 103*    Principal Problem:   Urinary tract infection Active Problems:   Long term (current) use of anticoagulants   Systolic CHF, chronic (HCC)   LV (left ventricular) mural thrombus (HCC)   Cardiomyopathy, ischemic   Fecal impaction (HCC)   Degenerative joint disease   CKD (chronic kidney disease) stage 3, GFR 30-59 ml/min   Time coordinating discharge: >30 minutes  Signed: Coralie Common Resident Physician 04/28/2015, 10:51 AM

## 2015-04-28 NOTE — Progress Notes (Signed)
Down via wheelchair accompanied by staff for discharge home in care of family and with home health.  Stable at discharge.

## 2015-04-30 ENCOUNTER — Other Ambulatory Visit: Payer: Self-pay | Admitting: *Deleted

## 2015-04-30 ENCOUNTER — Encounter: Payer: Self-pay | Admitting: *Deleted

## 2015-04-30 NOTE — Patient Outreach (Signed)
04/30/15- Telephone call for transition of care week 1, called patient's home phone and line is busy, called daughter (on consent form) [name], HIPAA verified, RN CM called [place], spoke with [name] Evens RN who verified home health is to start tomorrow 05/01/15. Pt agreeable to weekly transition of care calls and home visit.  RN CM faxed today's transition of care note and barrier letter to primary care Md Dr. [name].  Medications Reviewed Today    Reviewed by [name] Mends, RN (Registered Nurse) on 04/30/15 at 1359  Med List Status: <None>   Medication Order Taking? Sig Documenting Provider Last Dose Status Informant   acetaminophen (TYLENOL) 325 MG tablet [id number] Yes Take 325-650 mg by mouth every 6 (six) hours as needed for mild pain.  Historical Provider, MD Taking Active Self   atorvastatin (LIPITOR) 40 MG tablet [id number] Yes Take 1 tablet (40 mg total) by mouth daily at 6 PM. [name] Dresser, MD Taking Active Self   carvedilol (COREG) 25 MG tablet [id number] Yes TAKE 1 TABLET (25 MG TOTAL) BY MOUTH 2 (TWO) TIMES DAILY. [name] Artist, MD Taking Active Self   cholecalciferol (VITAMIN D) 1000 UNITS tablet [id number] Yes Take 1,000 Units by mouth daily.   Historical Provider, MD Taking Active Self   folic acid (FOLVITE) 1 MG tablet [id number] Yes Take 1 mg by mouth daily. Historical Provider, MD Taking Active Self   furosemide (LASIX) 40 MG tablet [id number] Yes Take 1 tablet (40 mg total) by mouth 2 (two) times daily. TAKE [name] Dresser, MD Taking Active Self   losartan (COZAAR) 25 MG tablet [id number] Yes TAKE 1 TABLET (25 MG TOTAL) BY MOUTH DAILY. [name] Dresser, MD Taking Active Self   meclizine (ANTIVERT) 25 MG tablet [id number] Yes Take 25 mg by mouth 3 (three) times daily as needed for dizziness. Historical Provider, MD Taking Active Self             Med Note [name]   Wed Apr 25, 2015 10:10 AM):      nitroGLYCERIN (NITROSTAT) 0.4 MG SL tablet [id number]  Yes Place 1 tablet (0.4 mg total) under the tongue every 5 (five) minutes as needed for chest pain. [name] Lance, MD Taking Active Self   pantoprazole (PROTONIX) 40 MG tablet [id number] Yes Take 1 tablet (40 mg total) by mouth daily. [name] Dresser, MD Taking Active Self   polyethylene glycol [facility] / GLYCOLAX) packet [id number] Yes Take 17 g by mouth daily. [name] Polite, MD Taking Active    predniSONE (DELTASONE) 10 MG tablet [id number] Yes Take 1 tablet (10 mg total) by mouth 2 (two) times daily with a meal. Take 10mg  BID for 3days the  Daily for 3days then STOP [name] Polite, MD Taking Active    saxagliptin HCl (ONGLYZA) 2.5 MG TABS tablet [id number] Yes Take 2.5 mg by mouth daily. Historical Provider, MD Taking Active Self   sertraline (ZOLOFT) 50 MG tablet [id number] Yes Take 50 mg by mouth daily.  Historical Provider, MD Taking Active Self   spironolactone (ALDACTONE) 25 MG tablet [id number] Yes TAKE ONE TABLET BY MOUTH ONE TIME DAILY [name] Artist, MD Taking Active Self   traMADol (ULTRAM) 50 MG tablet [id number] Yes Take 50 mg by mouth every 6 (six) hours as needed for moderate pain.  Historical Provider, MD Taking Active Self   warfarin (COUMADIN) 2.5 MG tablet [id number] Yes Take 1.25-2.5 mg by mouth daily. Take 1 tablet everyday except on  Monday, Wednesday,and Friday ( take 1/2 tablet those days) Historical Provider, MD Taking Active Self   Med List Note [name], [facility] 10/09/11 1404): w         [facility] CM Care Plan Problem One        Most Recent Value   Care Plan Problem One  Patient is high risk for hospital readmission related to disease processes   Role Documenting the Problem One  Care Management Coordinator   Care Plan for Problem One  Active   THN Long Term Goal (31-90 days)  pt will have no hospital readmissions within 90 days   THN Long Term Goal Start Date  04/30/15   Interventions for Problem One Long Term Goal  RN CM provided 24 hour nurse line to daughter, RN CM  called [place], spoke with [name] Evens RN- home health will start tomorrow 05/01/15, RN CM reviewed CHF action plan.   THN CM Short Term Goal #1 (0-30 days)  pt, daughter will call primary MD today and make appointment for within 7 days   THN CM Short Term Goal #1 Start Date  04/30/15   Interventions for Short Term Goal #1  RN CM ask daughter to call primary MD and make appointment for hospital follow up, daughter states this was on her agenda to complete today.     PLAN Follow up with initial home visit on 05/02/15  [name] [facility], [place] Coordinator [phone]

## 2015-05-01 DIAGNOSIS — I255 Ischemic cardiomyopathy: Secondary | ICD-10-CM | POA: Diagnosis not present

## 2015-05-01 DIAGNOSIS — Z7952 Long term (current) use of systemic steroids: Secondary | ICD-10-CM | POA: Diagnosis not present

## 2015-05-01 DIAGNOSIS — Z9981 Dependence on supplemental oxygen: Secondary | ICD-10-CM | POA: Diagnosis not present

## 2015-05-01 DIAGNOSIS — I5020 Unspecified systolic (congestive) heart failure: Secondary | ICD-10-CM | POA: Diagnosis not present

## 2015-05-01 DIAGNOSIS — M1990 Unspecified osteoarthritis, unspecified site: Secondary | ICD-10-CM | POA: Diagnosis not present

## 2015-05-01 DIAGNOSIS — I130 Hypertensive heart and chronic kidney disease with heart failure and stage 1 through stage 4 chronic kidney disease, or unspecified chronic kidney disease: Secondary | ICD-10-CM | POA: Diagnosis not present

## 2015-05-01 DIAGNOSIS — I2510 Atherosclerotic heart disease of native coronary artery without angina pectoris: Secondary | ICD-10-CM | POA: Diagnosis not present

## 2015-05-01 DIAGNOSIS — I252 Old myocardial infarction: Secondary | ICD-10-CM | POA: Diagnosis not present

## 2015-05-01 DIAGNOSIS — N183 Chronic kidney disease, stage 3 (moderate): Secondary | ICD-10-CM | POA: Diagnosis not present

## 2015-05-01 DIAGNOSIS — N390 Urinary tract infection, site not specified: Secondary | ICD-10-CM | POA: Diagnosis not present

## 2015-05-01 DIAGNOSIS — Z7901 Long term (current) use of anticoagulants: Secondary | ICD-10-CM | POA: Diagnosis not present

## 2015-05-02 ENCOUNTER — Encounter: Payer: Self-pay | Admitting: *Deleted

## 2015-05-02 ENCOUNTER — Other Ambulatory Visit: Payer: Self-pay | Admitting: *Deleted

## 2015-05-02 DIAGNOSIS — N390 Urinary tract infection, site not specified: Secondary | ICD-10-CM | POA: Diagnosis not present

## 2015-05-02 DIAGNOSIS — I10 Essential (primary) hypertension: Secondary | ICD-10-CM | POA: Diagnosis not present

## 2015-05-02 DIAGNOSIS — Z7901 Long term (current) use of anticoagulants: Secondary | ICD-10-CM | POA: Diagnosis not present

## 2015-05-02 DIAGNOSIS — I252 Old myocardial infarction: Secondary | ICD-10-CM | POA: Diagnosis not present

## 2015-05-02 DIAGNOSIS — N183 Chronic kidney disease, stage 3 (moderate): Secondary | ICD-10-CM | POA: Diagnosis not present

## 2015-05-02 DIAGNOSIS — I2510 Atherosclerotic heart disease of native coronary artery without angina pectoris: Secondary | ICD-10-CM | POA: Diagnosis not present

## 2015-05-02 DIAGNOSIS — Z9981 Dependence on supplemental oxygen: Secondary | ICD-10-CM | POA: Diagnosis not present

## 2015-05-02 DIAGNOSIS — I5020 Unspecified systolic (congestive) heart failure: Secondary | ICD-10-CM | POA: Diagnosis not present

## 2015-05-02 DIAGNOSIS — E119 Type 2 diabetes mellitus without complications: Secondary | ICD-10-CM | POA: Diagnosis not present

## 2015-05-02 DIAGNOSIS — M1990 Unspecified osteoarthritis, unspecified site: Secondary | ICD-10-CM | POA: Diagnosis not present

## 2015-05-02 DIAGNOSIS — Z7952 Long term (current) use of systemic steroids: Secondary | ICD-10-CM | POA: Diagnosis not present

## 2015-05-02 DIAGNOSIS — E7800 Pure hypercholesterolemia, unspecified: Secondary | ICD-10-CM | POA: Diagnosis not present

## 2015-05-02 DIAGNOSIS — I130 Hypertensive heart and chronic kidney disease with heart failure and stage 1 through stage 4 chronic kidney disease, or unspecified chronic kidney disease: Secondary | ICD-10-CM | POA: Diagnosis not present

## 2015-05-02 DIAGNOSIS — I255 Ischemic cardiomyopathy: Secondary | ICD-10-CM | POA: Diagnosis not present

## 2015-05-02 DIAGNOSIS — I513 Intracardiac thrombosis, not elsewhere classified: Secondary | ICD-10-CM | POA: Diagnosis not present

## 2015-05-02 NOTE — Patient Outreach (Addendum)
[place] [facility]) Care Management   05/02/2015  [name] 1925/11/11 [id number]  [name] is an 81 y.o. female  Subjective: Initial home visit with patient/ Transition of care week 1, spoke with pt, HIPAA verified, pt reports she is telemonitored by [employer] with daily weights and would like reinforcement reminders regarding CHF,  home health RN and PT working with pt.  Pt to see Dr. Maudie Mercury today.  Objective:   Filed Vitals:   05/02/15 1155  BP: 124/64  Pulse: 70  Resp: 18  Height: 1.499 m (4\' 11" )  Weight: 139 lb (63.05 kg)  SpO2: 96%   ROS  Physical Exam  Current Medications:   Current Outpatient Prescriptions  Medication Sig Dispense Refill  . acetaminophen (TYLENOL) 325 MG tablet Take 325-650 mg by mouth every 6 (six) hours as needed for mild pain.     Marland Kitchen atorvastatin (LIPITOR) 40 MG tablet Take 1 tablet (40 mg total) by mouth daily at 6 PM. 30 tablet 4  . carvedilol (COREG) 25 MG tablet TAKE 1 TABLET (25 MG TOTAL) BY MOUTH 2 (TWO) TIMES DAILY. 180 tablet 0  . cholecalciferol (VITAMIN D) 1000 UNITS tablet Take 1,000 Units by mouth daily.      . folic acid (FOLVITE) 1 MG tablet Take 1 mg by mouth daily.    . furosemide (LASIX) 40 MG tablet Take 1 tablet (40 mg total) by mouth 2 (two) times daily. TAKE 60 tablet 5  . losartan (COZAAR) 25 MG tablet TAKE 1 TABLET (25 MG TOTAL) BY MOUTH DAILY. 90 tablet 0  . meclizine (ANTIVERT) 25 MG tablet Take 25 mg by mouth 3 (three) times daily as needed for dizziness.    . nitroGLYCERIN (NITROSTAT) 0.4 MG SL tablet Place 1 tablet (0.4 mg total) under the tongue every 5 (five) minutes as needed for chest pain. 25 tablet 3  . pantoprazole (PROTONIX) 40 MG tablet Take 1 tablet (40 mg total) by mouth daily. 30 tablet 2  . polyethylene glycol (MIRALAX / GLYCOLAX) packet Take 17 g by mouth daily. 14 each 0  . predniSONE (DELTASONE) 10 MG tablet Take 1 tablet (10 mg total) by mouth 2 (two) times daily with a meal. Take  10mg  BID for 3days the  Daily for 3days then STOP 10 tablet 0  . saxagliptin HCl (ONGLYZA) 2.5 MG TABS tablet Take 2.5 mg by mouth daily.    . sertraline (ZOLOFT) 50 MG tablet Take 50 mg by mouth daily.     Marland Kitchen spironolactone (ALDACTONE) 25 MG tablet TAKE ONE TABLET BY MOUTH ONE TIME DAILY 90 tablet 1  . traMADol (ULTRAM) 50 MG tablet Take 50 mg by mouth every 6 (six) hours as needed for moderate pain.     Marland Kitchen warfarin (COUMADIN) 2.5 MG tablet Take 1.25-2.5 mg by mouth daily. Take 1 tablet everyday except on Monday, Wednesday,and Friday ( take 1/2 tablet those days)     No current facility-administered medications for this visit.    Functional Status:   In your present state of health, do you have any difficulty performing the following activities: 05/02/2015 04/25/2015  Hearing? Tempie Donning  Vision? Y Y  Difficulty concentrating or making decisions? N N  Walking or climbing stairs? Y Y  Dressing or bathing? N N  Doing errands, shopping? Tempie Donning  Preparing Food and eating ? Y -  Using the Toilet? N -  In the past six months, have you accidently leaked urine? Y -  Do you  have problems with loss of bowel control? N -  Managing your Medications? Y -  Managing your Finances? Y -  Housekeeping or managing your Housekeeping? Y -    Fall/Depression Screening:    PHQ 2/9 Scores 05/02/2015  PHQ - 2 Score 0   Fall Risk  05/02/2015  Falls in the past year? Yes  Number falls in past yr: 2 or more  Injury with Fall? No  Risk Factor Category  High Fall Risk  Risk for fall due to : History of fall(s);Impaired balance/gait;Medication side effect  Follow up Falls evaluation completed;Education provided;Falls prevention discussed   Assessment:  RN CM focus is on CHF teaching and reinforcement.  Daughter is involved in patient care, pt lives alone.  RN CM observed all medication bottles and reviewed with pt, daughter.  Pt has prefilled med box and this is working well for her. Pt agreeable to weekly transition of care  calls and follow up with home visit next month for reinforcement of plan of care.  RN CM faxed initial home visit and barrier letter to primary care MD Dr. [name].  THN CM Care Plan Problem One        Most Recent Value   Care Plan Problem One  Patient is high risk for hospital readmission related to disease processes   Role Documenting the Problem One  Care Management [place] for Problem One  Active   THN Long Term Goal (31-90 days)  pt will have no hospital readmissions within 90 days   THN Long Term Goal Start Date  04/30/15   Interventions for Problem One Long Term Goal  RN CM gave patient [facility] calendar and reviewed CHF section with daily weights/ CHF zones, pt is working with home health RN and PT   THN CM Short Term Goal #1 (0-30 days)  pt, daughter will call primary MD today and make appointment for within 7 days   THN CM Short Term Goal #1 Start Date  04/30/15   Interventions for Short Term Goal #1  Pt has appointment with primary care MD today   [facility] CM Short Term Goal #2 (0-30 days)  Pt will verbalize CHF zones within 30 days.   THN CM Short Term Goal #2 Start Date  05/02/15   Interventions for Short Term Goal #2  RN CM reviewed CHF zones and importance of calling MD early for change in health status, symptoms, reviewed signs/ symptoms CHF exacerbation.gave EMMI handouts HF: Rest, Exercise and blood pressure, About increased blood pressure, UTI      Plan: continue weekly transition of care calls See pt for home visit 06/06/15 Continue CHF teaching, reinforcement  Jacqlyn Larsen [facility], [place] Coordinator [phone]

## 2015-05-03 ENCOUNTER — Telehealth: Payer: Self-pay | Admitting: *Deleted

## 2015-05-03 DIAGNOSIS — N183 Chronic kidney disease, stage 3 (moderate): Secondary | ICD-10-CM | POA: Diagnosis not present

## 2015-05-03 DIAGNOSIS — M1990 Unspecified osteoarthritis, unspecified site: Secondary | ICD-10-CM | POA: Diagnosis not present

## 2015-05-03 DIAGNOSIS — Z7952 Long term (current) use of systemic steroids: Secondary | ICD-10-CM | POA: Diagnosis not present

## 2015-05-03 DIAGNOSIS — Z7901 Long term (current) use of anticoagulants: Secondary | ICD-10-CM | POA: Diagnosis not present

## 2015-05-03 DIAGNOSIS — N390 Urinary tract infection, site not specified: Secondary | ICD-10-CM | POA: Diagnosis not present

## 2015-05-03 DIAGNOSIS — I5020 Unspecified systolic (congestive) heart failure: Secondary | ICD-10-CM | POA: Diagnosis not present

## 2015-05-03 DIAGNOSIS — I2510 Atherosclerotic heart disease of native coronary artery without angina pectoris: Secondary | ICD-10-CM | POA: Diagnosis not present

## 2015-05-03 DIAGNOSIS — I130 Hypertensive heart and chronic kidney disease with heart failure and stage 1 through stage 4 chronic kidney disease, or unspecified chronic kidney disease: Secondary | ICD-10-CM | POA: Diagnosis not present

## 2015-05-03 DIAGNOSIS — I255 Ischemic cardiomyopathy: Secondary | ICD-10-CM | POA: Diagnosis not present

## 2015-05-03 DIAGNOSIS — Z9981 Dependence on supplemental oxygen: Secondary | ICD-10-CM | POA: Diagnosis not present

## 2015-05-03 DIAGNOSIS — I252 Old myocardial infarction: Secondary | ICD-10-CM | POA: Diagnosis not present

## 2015-05-03 NOTE — Telephone Encounter (Signed)
Spoke with Otila Kluver who states pt has recently been in the hospital and they are now in the home with the pt and that Pt states she went to see Dr Maudie Mercury yesterday and her INR was 2.1 The pt was in hospital 04/25/2015 to 04/28/2015 with UTI and had Ceftriaxone and 1 dose of Fosfomycin and has been sent home to take prednisone 10 mg 1 tab bid to help with DJD  Spoke with Dr Julianne Rice office and she states that her INR was checked by  Faith Rogue yesterday and was 2.1 so instructed to recheck her INR on Monday Feb 13th as she is now on low dose Prednisone . Checked dose of coumadin and pt is on the same dose of coumadin as on last visit to clinic 2.5mg  daily except 1.25mg  on Mondays Wednesdays and Fridays Pt to continue on this dose and will recheck INR on Monday  04/28/2015  INR 2.02 Coumadin 1.25mg  04/27/2015  Coumadin 3mg  04/26/2015 INR 1.55 coumadin 2.5mg  04/25/2015 INR 1.45 coumadin 2.5mg 

## 2015-05-07 ENCOUNTER — Other Ambulatory Visit (HOSPITAL_COMMUNITY): Payer: Self-pay | Admitting: *Deleted

## 2015-05-07 ENCOUNTER — Ambulatory Visit (INDEPENDENT_AMBULATORY_CARE_PROVIDER_SITE_OTHER): Payer: Medicare Other | Admitting: Internal Medicine

## 2015-05-07 DIAGNOSIS — Z9981 Dependence on supplemental oxygen: Secondary | ICD-10-CM | POA: Diagnosis not present

## 2015-05-07 DIAGNOSIS — I5020 Unspecified systolic (congestive) heart failure: Secondary | ICD-10-CM | POA: Diagnosis not present

## 2015-05-07 DIAGNOSIS — Z5181 Encounter for therapeutic drug level monitoring: Secondary | ICD-10-CM

## 2015-05-07 DIAGNOSIS — I130 Hypertensive heart and chronic kidney disease with heart failure and stage 1 through stage 4 chronic kidney disease, or unspecified chronic kidney disease: Secondary | ICD-10-CM | POA: Diagnosis not present

## 2015-05-07 DIAGNOSIS — I2510 Atherosclerotic heart disease of native coronary artery without angina pectoris: Secondary | ICD-10-CM | POA: Diagnosis not present

## 2015-05-07 DIAGNOSIS — Z7952 Long term (current) use of systemic steroids: Secondary | ICD-10-CM | POA: Diagnosis not present

## 2015-05-07 DIAGNOSIS — I255 Ischemic cardiomyopathy: Secondary | ICD-10-CM | POA: Diagnosis not present

## 2015-05-07 DIAGNOSIS — N183 Chronic kidney disease, stage 3 (moderate): Secondary | ICD-10-CM | POA: Diagnosis not present

## 2015-05-07 DIAGNOSIS — I513 Intracardiac thrombosis, not elsewhere classified: Secondary | ICD-10-CM

## 2015-05-07 DIAGNOSIS — I252 Old myocardial infarction: Secondary | ICD-10-CM | POA: Diagnosis not present

## 2015-05-07 DIAGNOSIS — M1990 Unspecified osteoarthritis, unspecified site: Secondary | ICD-10-CM | POA: Diagnosis not present

## 2015-05-07 DIAGNOSIS — Z7901 Long term (current) use of anticoagulants: Secondary | ICD-10-CM | POA: Diagnosis not present

## 2015-05-07 DIAGNOSIS — N390 Urinary tract infection, site not specified: Secondary | ICD-10-CM | POA: Diagnosis not present

## 2015-05-07 DIAGNOSIS — I213 ST elevation (STEMI) myocardial infarction of unspecified site: Secondary | ICD-10-CM

## 2015-05-07 LAB — POCT INR: INR: 2.7

## 2015-05-07 MED ORDER — CARVEDILOL 25 MG PO TABS: ORAL_TABLET | ORAL | Status: DC

## 2015-05-08 ENCOUNTER — Other Ambulatory Visit (HOSPITAL_COMMUNITY): Payer: Self-pay | Admitting: *Deleted

## 2015-05-09 ENCOUNTER — Other Ambulatory Visit: Payer: Self-pay | Admitting: *Deleted

## 2015-05-09 DIAGNOSIS — I130 Hypertensive heart and chronic kidney disease with heart failure and stage 1 through stage 4 chronic kidney disease, or unspecified chronic kidney disease: Secondary | ICD-10-CM | POA: Diagnosis not present

## 2015-05-09 DIAGNOSIS — Z9981 Dependence on supplemental oxygen: Secondary | ICD-10-CM | POA: Diagnosis not present

## 2015-05-09 DIAGNOSIS — I5020 Unspecified systolic (congestive) heart failure: Secondary | ICD-10-CM | POA: Diagnosis not present

## 2015-05-09 DIAGNOSIS — I252 Old myocardial infarction: Secondary | ICD-10-CM | POA: Diagnosis not present

## 2015-05-09 DIAGNOSIS — N183 Chronic kidney disease, stage 3 (moderate): Secondary | ICD-10-CM | POA: Diagnosis not present

## 2015-05-09 DIAGNOSIS — M1990 Unspecified osteoarthritis, unspecified site: Secondary | ICD-10-CM | POA: Diagnosis not present

## 2015-05-09 DIAGNOSIS — I2510 Atherosclerotic heart disease of native coronary artery without angina pectoris: Secondary | ICD-10-CM | POA: Diagnosis not present

## 2015-05-09 DIAGNOSIS — Z7901 Long term (current) use of anticoagulants: Secondary | ICD-10-CM | POA: Diagnosis not present

## 2015-05-09 DIAGNOSIS — N390 Urinary tract infection, site not specified: Secondary | ICD-10-CM | POA: Diagnosis not present

## 2015-05-09 DIAGNOSIS — I255 Ischemic cardiomyopathy: Secondary | ICD-10-CM | POA: Diagnosis not present

## 2015-05-09 DIAGNOSIS — Z7952 Long term (current) use of systemic steroids: Secondary | ICD-10-CM | POA: Diagnosis not present

## 2015-05-09 NOTE — Patient Outreach (Signed)
05/09/15- Telephone call to patient for transition of care week 2, spoke with pt, HIPAA verified, pt states she continues weighing daily and weight today is 136 pounds and telemonitored by [employer]. Pt reports she has all medications and taking as prescribed.  No new concerns or questions.  THN CM Care Plan Problem One        Most Recent Value   Care Plan Problem One  Patient is high risk for hospital readmission related to disease processes   Role Documenting the Problem One  Care Management [place] for Problem One  Active   THN Long Term Goal (31-90 days)  pt will have no hospital readmissions within 90 days   THN Long Term Goal Start Date  04/30/15   Interventions for Problem One Long Term Goal  RN CM confirmed with pt that she is still working with home health RN and PT, reviewed importance of taking all medications as prescribed, daily weights, symptom managment   THN CM Short Term Goal #1 (0-30 days)  pt, daughter will call primary MD today and make appointment for within 7 days   THN CM Short Term Goal #1 Start Date  04/30/15   [facility] CM Short Term Goal #1 Met Date  05/09/15 [pt attended appointment]   Interventions for Short Term Goal #1  RN CM talked with pt about MD visiit and pt verbalizes no changes made and follow up for bloodwork in 2 weeks   THN CM Short Term Goal #2 (0-30 days)  Pt will verbalize CHF zones within 30 days.   THN CM Short Term Goal #2 Start Date  05/02/15   Interventions for Short Term Goal #2  RN CM reinforced CHF zones and importance of calling MD early for change in health status, symptoms, reviewed signs/ symptoms CHF exacerbation     PLAN Follow up with transition of care week 3 phone call on 06/11/15  Jacqlyn Larsen [facility], [place] Coordinator [phone]

## 2015-05-11 ENCOUNTER — Other Ambulatory Visit (HOSPITAL_COMMUNITY): Payer: Self-pay | Admitting: *Deleted

## 2015-05-11 DIAGNOSIS — Z9981 Dependence on supplemental oxygen: Secondary | ICD-10-CM | POA: Diagnosis not present

## 2015-05-11 DIAGNOSIS — I255 Ischemic cardiomyopathy: Secondary | ICD-10-CM | POA: Diagnosis not present

## 2015-05-11 DIAGNOSIS — Z7952 Long term (current) use of systemic steroids: Secondary | ICD-10-CM | POA: Diagnosis not present

## 2015-05-11 DIAGNOSIS — I5020 Unspecified systolic (congestive) heart failure: Secondary | ICD-10-CM | POA: Diagnosis not present

## 2015-05-11 DIAGNOSIS — I252 Old myocardial infarction: Secondary | ICD-10-CM | POA: Diagnosis not present

## 2015-05-11 DIAGNOSIS — I130 Hypertensive heart and chronic kidney disease with heart failure and stage 1 through stage 4 chronic kidney disease, or unspecified chronic kidney disease: Secondary | ICD-10-CM | POA: Diagnosis not present

## 2015-05-11 DIAGNOSIS — M1990 Unspecified osteoarthritis, unspecified site: Secondary | ICD-10-CM | POA: Diagnosis not present

## 2015-05-11 DIAGNOSIS — N183 Chronic kidney disease, stage 3 (moderate): Secondary | ICD-10-CM | POA: Diagnosis not present

## 2015-05-11 DIAGNOSIS — Z7901 Long term (current) use of anticoagulants: Secondary | ICD-10-CM | POA: Diagnosis not present

## 2015-05-11 DIAGNOSIS — I2510 Atherosclerotic heart disease of native coronary artery without angina pectoris: Secondary | ICD-10-CM | POA: Diagnosis not present

## 2015-05-11 DIAGNOSIS — N390 Urinary tract infection, site not specified: Secondary | ICD-10-CM | POA: Diagnosis not present

## 2015-05-11 MED ORDER — CARVEDILOL 25 MG PO TABS: ORAL_TABLET | ORAL | Status: DC

## 2015-05-12 DIAGNOSIS — N183 Chronic kidney disease, stage 3 (moderate): Secondary | ICD-10-CM | POA: Diagnosis not present

## 2015-05-12 DIAGNOSIS — Z9981 Dependence on supplemental oxygen: Secondary | ICD-10-CM | POA: Diagnosis not present

## 2015-05-12 DIAGNOSIS — I252 Old myocardial infarction: Secondary | ICD-10-CM | POA: Diagnosis not present

## 2015-05-12 DIAGNOSIS — N390 Urinary tract infection, site not specified: Secondary | ICD-10-CM | POA: Diagnosis not present

## 2015-05-12 DIAGNOSIS — I2510 Atherosclerotic heart disease of native coronary artery without angina pectoris: Secondary | ICD-10-CM | POA: Diagnosis not present

## 2015-05-12 DIAGNOSIS — I255 Ischemic cardiomyopathy: Secondary | ICD-10-CM | POA: Diagnosis not present

## 2015-05-12 DIAGNOSIS — Z7952 Long term (current) use of systemic steroids: Secondary | ICD-10-CM | POA: Diagnosis not present

## 2015-05-12 DIAGNOSIS — M1990 Unspecified osteoarthritis, unspecified site: Secondary | ICD-10-CM | POA: Diagnosis not present

## 2015-05-12 DIAGNOSIS — Z7901 Long term (current) use of anticoagulants: Secondary | ICD-10-CM | POA: Diagnosis not present

## 2015-05-12 DIAGNOSIS — I5020 Unspecified systolic (congestive) heart failure: Secondary | ICD-10-CM | POA: Diagnosis not present

## 2015-05-12 DIAGNOSIS — I130 Hypertensive heart and chronic kidney disease with heart failure and stage 1 through stage 4 chronic kidney disease, or unspecified chronic kidney disease: Secondary | ICD-10-CM | POA: Diagnosis not present

## 2015-05-13 DIAGNOSIS — R0602 Shortness of breath: Secondary | ICD-10-CM | POA: Diagnosis not present

## 2015-05-13 DIAGNOSIS — I509 Heart failure, unspecified: Secondary | ICD-10-CM | POA: Diagnosis not present

## 2015-05-14 ENCOUNTER — Other Ambulatory Visit: Payer: Self-pay | Admitting: *Deleted

## 2015-05-14 DIAGNOSIS — I2510 Atherosclerotic heart disease of native coronary artery without angina pectoris: Secondary | ICD-10-CM | POA: Diagnosis not present

## 2015-05-14 DIAGNOSIS — N183 Chronic kidney disease, stage 3 (moderate): Secondary | ICD-10-CM | POA: Diagnosis not present

## 2015-05-14 DIAGNOSIS — Z7901 Long term (current) use of anticoagulants: Secondary | ICD-10-CM | POA: Diagnosis not present

## 2015-05-14 DIAGNOSIS — M1990 Unspecified osteoarthritis, unspecified site: Secondary | ICD-10-CM | POA: Diagnosis not present

## 2015-05-14 DIAGNOSIS — I255 Ischemic cardiomyopathy: Secondary | ICD-10-CM | POA: Diagnosis not present

## 2015-05-14 DIAGNOSIS — N390 Urinary tract infection, site not specified: Secondary | ICD-10-CM | POA: Diagnosis not present

## 2015-05-14 DIAGNOSIS — Z7952 Long term (current) use of systemic steroids: Secondary | ICD-10-CM | POA: Diagnosis not present

## 2015-05-14 DIAGNOSIS — Z9981 Dependence on supplemental oxygen: Secondary | ICD-10-CM | POA: Diagnosis not present

## 2015-05-14 DIAGNOSIS — I5020 Unspecified systolic (congestive) heart failure: Secondary | ICD-10-CM | POA: Diagnosis not present

## 2015-05-14 DIAGNOSIS — I130 Hypertensive heart and chronic kidney disease with heart failure and stage 1 through stage 4 chronic kidney disease, or unspecified chronic kidney disease: Secondary | ICD-10-CM | POA: Diagnosis not present

## 2015-05-14 DIAGNOSIS — I252 Old myocardial infarction: Secondary | ICD-10-CM | POA: Diagnosis not present

## 2015-05-14 NOTE — Patient Outreach (Signed)
05/14/15- Telephone call for transition of care week 3, pt reports " I'm doing very well, I was able to go to church yesterday"  Pt reports home health continues to see her weekly and does allow her to leave home for church.  Pt states weight approximately 138 pounds, she has not weighed today.  Pt reports she has all medications and taking as prescribed, no new concerns voiced today.    THN CM Care Plan Problem One        Most Recent Value   Care Plan Problem One  Patient is high risk for hospital readmission related to disease processes   Role Documenting the Problem One  Care Management [place] for Problem One  Active   THN Long Term Goal (31-90 days)  pt will have no hospital readmissions within 90 days   THN Long Term Goal Start Date  04/30/15   Interventions for Problem One Long Term Goal  RN CM reviewed importance of taking all medications as prescribed, managing symptoms and calling early for change in health status.   THN CM Short Term Goal #1 Met Date  -- [pt attended appointment]   THN CM Short Term Goal #2 (0-30 days)  Pt will verbalize CHF zones within 30 days.   THN CM Short Term Goal #2 Start Date  05/02/15   Interventions for Short Term Goal #2  RN CM reviewed CHF zones and importance of calling MD early for change in health status, symptoms, reviewed signs/ symptoms CHF exacerbation      Jacqlyn Larsen [facility], BSN [place] Coordinator [phone]   PLAN Follow up with transition of care call next week.  Jacqlyn Larsen [facility], [place] Coordinator [phone]

## 2015-05-15 DIAGNOSIS — Z7952 Long term (current) use of systemic steroids: Secondary | ICD-10-CM | POA: Diagnosis not present

## 2015-05-15 DIAGNOSIS — Z7901 Long term (current) use of anticoagulants: Secondary | ICD-10-CM | POA: Diagnosis not present

## 2015-05-15 DIAGNOSIS — N183 Chronic kidney disease, stage 3 (moderate): Secondary | ICD-10-CM | POA: Diagnosis not present

## 2015-05-15 DIAGNOSIS — N390 Urinary tract infection, site not specified: Secondary | ICD-10-CM | POA: Diagnosis not present

## 2015-05-15 DIAGNOSIS — I255 Ischemic cardiomyopathy: Secondary | ICD-10-CM | POA: Diagnosis not present

## 2015-05-15 DIAGNOSIS — I2510 Atherosclerotic heart disease of native coronary artery without angina pectoris: Secondary | ICD-10-CM | POA: Diagnosis not present

## 2015-05-15 DIAGNOSIS — Z9981 Dependence on supplemental oxygen: Secondary | ICD-10-CM | POA: Diagnosis not present

## 2015-05-15 DIAGNOSIS — I5020 Unspecified systolic (congestive) heart failure: Secondary | ICD-10-CM | POA: Diagnosis not present

## 2015-05-15 DIAGNOSIS — I130 Hypertensive heart and chronic kidney disease with heart failure and stage 1 through stage 4 chronic kidney disease, or unspecified chronic kidney disease: Secondary | ICD-10-CM | POA: Diagnosis not present

## 2015-05-15 DIAGNOSIS — M1990 Unspecified osteoarthritis, unspecified site: Secondary | ICD-10-CM | POA: Diagnosis not present

## 2015-05-15 DIAGNOSIS — I252 Old myocardial infarction: Secondary | ICD-10-CM | POA: Diagnosis not present

## 2015-05-18 DIAGNOSIS — N390 Urinary tract infection, site not specified: Secondary | ICD-10-CM | POA: Diagnosis not present

## 2015-05-18 DIAGNOSIS — I10 Essential (primary) hypertension: Secondary | ICD-10-CM | POA: Diagnosis not present

## 2015-05-21 ENCOUNTER — Other Ambulatory Visit: Payer: Self-pay | Admitting: *Deleted

## 2015-05-21 NOTE — Patient Outreach (Signed)
05/21/15- Telephone call for transition of care week 4, spoke with pt, HIPAA verified.  Pt reports " I'm doing very well"  Pt continues to weigh daily and weight today 140 pounds, pt states " I ate a lot of food this weekend, my appetite is very good"   Pt states she has all medications, denies dyspnea, is using oxygen at hs only, family continues to assist as needed, no new concerns verbalized at this time.  PLAN See pt for home visit on 3/ 14/17  Jacqlyn Larsen [facility], [place] Coordinator [phone]

## 2015-05-22 DIAGNOSIS — E119 Type 2 diabetes mellitus without complications: Secondary | ICD-10-CM | POA: Diagnosis not present

## 2015-05-22 DIAGNOSIS — I10 Essential (primary) hypertension: Secondary | ICD-10-CM | POA: Diagnosis not present

## 2015-05-22 DIAGNOSIS — N390 Urinary tract infection, site not specified: Secondary | ICD-10-CM | POA: Diagnosis not present

## 2015-06-05 ENCOUNTER — Other Ambulatory Visit: Payer: Self-pay | Admitting: *Deleted

## 2015-06-05 ENCOUNTER — Encounter: Payer: Self-pay | Admitting: *Deleted

## 2015-06-05 NOTE — Patient Outreach (Signed)
[name]) Care Management   06/05/2015  [name] [birth year] [id number]  [name] is an 82 y.o. female  Subjective: Routine home visit with pt, HIPAA verified, pt states home health has discharged and says " I'm managing pretty well at home, my family helps me and I have my Life Alert necklace".  Pt reports LIfe Alert recently checked and is working correctly.  Pt states her weight is " holding pretty good but I ate way too much food this weekend at a birthday party".  Pt states she is now on "lowgrade antibiotic for urinary tract infections that I take everyday"  Objective:   Filed Vitals:   06/05/15 1211  BP: 138/68  Pulse: 64  Resp: 16  Weight: 142 lb (64.411 kg)  SpO2: 95%   ROS  Physical Exam  Constitutional: She is oriented to person, place, and time. She appears well-developed and well-nourished.  HENT:  Head: Normocephalic.  Neck: Normal range of motion. Neck supple.  Cardiovascular: Normal rate.   Respiratory: Effort normal and breath sounds normal.  GI: Soft. Bowel sounds are normal.  Musculoskeletal: Normal range of motion. She exhibits no edema.  Neurological: She is alert and oriented to person, place, and time.  Skin: Skin is warm and dry.  Psychiatric: She has a normal mood and affect. Her behavior is normal. Judgment and thought content normal.    Current Medications:   Current Outpatient Prescriptions  Medication Sig Dispense Refill  . acetaminophen (TYLENOL) 325 MG tablet Take 325-650 mg by mouth every 6 (six) hours as needed for mild pain.     Marland Kitchen atorvastatin (LIPITOR) 40 MG tablet Take 1 tablet (40 mg total) by mouth daily at 6 PM. 30 tablet 4  . carvedilol (COREG) 25 MG tablet TAKE 1 TABLET (25 MG TOTAL) BY MOUTH 2 (TWO) TIMES DAILY. 180 tablet 3  . cholecalciferol (VITAMIN D) 1000 UNITS tablet Take 1,000 Units by mouth daily.      . folic acid (FOLVITE) 1 MG tablet Take 1 mg by mouth daily.    . furosemide (LASIX) 40 MG  tablet Take 1 tablet (40 mg total) by mouth 2 (two) times daily. TAKE 60 tablet 5  . losartan (COZAAR) 25 MG tablet TAKE 1 TABLET (25 MG TOTAL) BY MOUTH DAILY. 90 tablet 0  . meclizine (ANTIVERT) 25 MG tablet Take 25 mg by mouth 3 (three) times daily as needed for dizziness.    . nitrofurantoin, macrocrystal-monohydrate, (MACROBID) 100 MG capsule Take 100 mg by mouth 1 day or 1 dose.    . nitroGLYCERIN (NITROSTAT) 0.4 MG SL tablet Place 1 tablet (0.4 mg total) under the tongue every 5 (five) minutes as needed for chest pain. 25 tablet 3  . pantoprazole (PROTONIX) 40 MG tablet Take 1 tablet (40 mg total) by mouth daily. 30 tablet 2  . polyethylene glycol (MIRALAX / GLYCOLAX) packet Take 17 g by mouth daily. 14 each 0  . saxagliptin HCl (ONGLYZA) 2.5 MG TABS tablet Take 2.5 mg by mouth daily.    . sertraline (ZOLOFT) 50 MG tablet Take 50 mg by mouth daily.     Marland Kitchen spironolactone (ALDACTONE) 25 MG tablet TAKE ONE TABLET BY MOUTH ONE TIME DAILY 90 tablet 1  . traMADol (ULTRAM) 50 MG tablet Take 50 mg by mouth every 6 (six) hours as needed for moderate pain.     Marland Kitchen warfarin (COUMADIN) 2.5 MG tablet Take 1.25-2.5 mg by mouth daily. Take 1 tablet everyday except on  Monday, Wednesday,and Friday ( take 1/2 tablet those days)     No current facility-administered medications for this visit.    Functional Status:   In your present state of health, do you have any difficulty performing the following activities: 05/02/2015 04/25/2015  Hearing? Tempie Donning  Vision? Y Y  Difficulty concentrating or making decisions? N N  Walking or climbing stairs? Y Y  Dressing or bathing? N N  Doing errands, shopping? Tempie Donning  Preparing Food and eating ? Y -  Using the Toilet? N -  In the past six months, have you accidently leaked urine? Y -  Do you have problems with loss of bowel control? N -  Managing your Medications? Y -  Managing your Finances? Y -  Housekeeping or managing your Housekeeping? Y -    Fall/Depression Screening:     PHQ 2/9 Scores 05/02/2015  PHQ - 2 Score 0    Assessment:  RN CM focused on CHF action plan and medication teaching. Pt continues to weigh daily.  Pt to see Dr. Maudie Mercury 06/20/15. RN CM reviewed signs/ symptoms urinary tract infection and ways to prevent.  THN CM Care Plan Problem One        Most Recent Value   Care Plan Problem One  Patient is high risk for hospital readmission related to disease processes   Role Documenting the Problem One  Care Management [place] for Problem One  Active   THN Long Term Goal (31-90 days)  pt will have no hospital readmissions within 90 days   THN Long Term Goal Start Date  04/30/15   Interventions for Problem One Long Term Goal  RN CM reviewed importance of taking all medications as prescribed, managing symptoms and calling early for change in health status.   THN CM Short Term Goal #1 (0-30 days)  pt will verbalize medications for CHF within 30 days.   THN CM Short Term Goal #1 Start Date  06/05/15   THN CM Short Term Goal #1 Met Date  -- [pt attended appointment]   Interventions for Short Term Goal #1  RN CM reviewed medications with pt,- purpose, dosage, common side effects.   THN CM Short Term Goal #2 (0-30 days)  Pt will verbalize CHF zones within 30 days.   THN CM Short Term Goal #2 Start Date  06/05/15 [goal restarted-needs reinforcement]   Interventions for Short Term Goal #2  RN CM reiterated CHF zones and importance of calling MD early for change in health status, symptoms, reviewed signs/ symptoms CHF exacerbation     Plan: follow up with home visit 07/03/15  Jacqlyn Larsen [facility], [place] Coordinator [phone]

## 2015-06-08 ENCOUNTER — Other Ambulatory Visit: Payer: Self-pay | Admitting: Internal Medicine

## 2015-06-10 DIAGNOSIS — I509 Heart failure, unspecified: Secondary | ICD-10-CM | POA: Diagnosis not present

## 2015-06-10 DIAGNOSIS — R0602 Shortness of breath: Secondary | ICD-10-CM | POA: Diagnosis not present

## 2015-06-11 ENCOUNTER — Ambulatory Visit (INDEPENDENT_AMBULATORY_CARE_PROVIDER_SITE_OTHER): Payer: Medicare Other | Admitting: *Deleted

## 2015-06-11 ENCOUNTER — Other Ambulatory Visit: Payer: Self-pay | Admitting: *Deleted

## 2015-06-11 DIAGNOSIS — Z5181 Encounter for therapeutic drug level monitoring: Secondary | ICD-10-CM | POA: Diagnosis not present

## 2015-06-11 DIAGNOSIS — I513 Intracardiac thrombosis, not elsewhere classified: Secondary | ICD-10-CM

## 2015-06-11 DIAGNOSIS — I213 ST elevation (STEMI) myocardial infarction of unspecified site: Secondary | ICD-10-CM | POA: Diagnosis not present

## 2015-06-11 DIAGNOSIS — Z7901 Long term (current) use of anticoagulants: Secondary | ICD-10-CM | POA: Diagnosis not present

## 2015-06-11 DIAGNOSIS — I253 Aneurysm of heart: Secondary | ICD-10-CM

## 2015-06-11 LAB — POCT INR: INR: 1.5

## 2015-06-11 MED ORDER — PANTOPRAZOLE SODIUM 40 MG PO TBEC: 40.0000 mg | DELAYED_RELEASE_TABLET | Freq: Every day | ORAL | Status: DC

## 2015-06-12 DIAGNOSIS — E119 Type 2 diabetes mellitus without complications: Secondary | ICD-10-CM | POA: Diagnosis not present

## 2015-06-12 DIAGNOSIS — N390 Urinary tract infection, site not specified: Secondary | ICD-10-CM | POA: Diagnosis not present

## 2015-06-12 DIAGNOSIS — I10 Essential (primary) hypertension: Secondary | ICD-10-CM | POA: Diagnosis not present

## 2015-06-20 ENCOUNTER — Ambulatory Visit (INDEPENDENT_AMBULATORY_CARE_PROVIDER_SITE_OTHER): Payer: Medicare Other | Admitting: *Deleted

## 2015-06-20 DIAGNOSIS — I253 Aneurysm of heart: Secondary | ICD-10-CM | POA: Diagnosis not present

## 2015-06-20 DIAGNOSIS — E119 Type 2 diabetes mellitus without complications: Secondary | ICD-10-CM | POA: Diagnosis not present

## 2015-06-20 DIAGNOSIS — I513 Intracardiac thrombosis, not elsewhere classified: Secondary | ICD-10-CM

## 2015-06-20 DIAGNOSIS — I213 ST elevation (STEMI) myocardial infarction of unspecified site: Secondary | ICD-10-CM | POA: Diagnosis not present

## 2015-06-20 DIAGNOSIS — Z5181 Encounter for therapeutic drug level monitoring: Secondary | ICD-10-CM | POA: Diagnosis not present

## 2015-06-20 DIAGNOSIS — E7800 Pure hypercholesterolemia, unspecified: Secondary | ICD-10-CM | POA: Diagnosis not present

## 2015-06-20 DIAGNOSIS — Z7901 Long term (current) use of anticoagulants: Secondary | ICD-10-CM | POA: Diagnosis not present

## 2015-06-20 DIAGNOSIS — N390 Urinary tract infection, site not specified: Secondary | ICD-10-CM | POA: Diagnosis not present

## 2015-06-20 LAB — POCT INR: INR: 1.8

## 2015-06-26 DIAGNOSIS — B078 Other viral warts: Secondary | ICD-10-CM | POA: Diagnosis not present

## 2015-06-26 DIAGNOSIS — D485 Neoplasm of uncertain behavior of skin: Secondary | ICD-10-CM | POA: Diagnosis not present

## 2015-06-26 DIAGNOSIS — D225 Melanocytic nevi of trunk: Secondary | ICD-10-CM | POA: Diagnosis not present

## 2015-06-26 DIAGNOSIS — L570 Actinic keratosis: Secondary | ICD-10-CM | POA: Diagnosis not present

## 2015-07-03 ENCOUNTER — Ambulatory Visit (INDEPENDENT_AMBULATORY_CARE_PROVIDER_SITE_OTHER): Payer: Medicare Other | Admitting: *Deleted

## 2015-07-03 ENCOUNTER — Other Ambulatory Visit: Payer: Self-pay | Admitting: *Deleted

## 2015-07-03 ENCOUNTER — Encounter: Payer: Self-pay | Admitting: *Deleted

## 2015-07-03 DIAGNOSIS — I213 ST elevation (STEMI) myocardial infarction of unspecified site: Secondary | ICD-10-CM | POA: Diagnosis not present

## 2015-07-03 DIAGNOSIS — Z7901 Long term (current) use of anticoagulants: Secondary | ICD-10-CM | POA: Diagnosis not present

## 2015-07-03 DIAGNOSIS — I513 Intracardiac thrombosis, not elsewhere classified: Secondary | ICD-10-CM

## 2015-07-03 DIAGNOSIS — I253 Aneurysm of heart: Secondary | ICD-10-CM | POA: Diagnosis not present

## 2015-07-03 DIAGNOSIS — Z5181 Encounter for therapeutic drug level monitoring: Secondary | ICD-10-CM | POA: Diagnosis not present

## 2015-07-03 LAB — POCT INR: INR: 2.2

## 2015-07-03 NOTE — Patient Outreach (Signed)
[facility]) Care Management   07/03/2015  [name] 1925-07-25 [id number]  [name] is an 82 y.o. female  Subjective: Initial home visit with pt, HIPAA verified, pt states "I'm doing pretty good, I'm still weighing every day"  Pt reports her daughter continues assisting her with appointments and errands.  Pt saw Dr. Maudie Mercury 06/20/15 and no changes made.  Pt states she is still on " low grade antibiotic for bladder infections"  Pt states " I hope you can come out to see me at least once more next month"    Objective:   Filed Vitals:   07/03/15 1221  BP: 126/58  Pulse: 62  Resp: 16  Weight: 142 lb (64.411 kg)  SpO2: 93%   ROS  Physical Exam  Constitutional: She is oriented to person, place, and time. She appears well-developed and well-nourished.  HENT:  Head: Normocephalic.  Neck: Normal range of motion. Neck supple.  Cardiovascular: Normal rate and regular rhythm.   Respiratory: Effort normal and breath sounds normal.  GI: Soft. Bowel sounds are normal.  Musculoskeletal: Normal range of motion. She exhibits no edema.  Neurological: She is alert and oriented to person, place, and time.  Skin: Skin is warm and dry.  Psychiatric: She has a normal mood and affect. Her behavior is normal. Judgment and thought content normal.    Encounter Medications:   Outpatient Encounter Prescriptions as of 07/03/2015  Medication Sig  . acetaminophen (TYLENOL) 325 MG tablet Take 325-650 mg by mouth every 6 (six) hours as needed for mild pain.   Marland Kitchen atorvastatin (LIPITOR) 40 MG tablet Take 1 tablet (40 mg total) by mouth daily at 6 PM.  . carvedilol (COREG) 25 MG tablet TAKE 1 TABLET (25 MG TOTAL) BY MOUTH 2 (TWO) TIMES DAILY.  . cholecalciferol (VITAMIN D) 1000 UNITS tablet Take 1,000 Units by mouth daily.    . folic acid (FOLVITE) 1 MG tablet Take 1 mg by mouth daily.  . furosemide (LASIX) 40 MG tablet Take 1 tablet (40 mg total) by mouth 2 (two) times daily. TAKE  .  losartan (COZAAR) 25 MG tablet TAKE 1 TABLET (25 MG TOTAL) BY MOUTH DAILY.  . meclizine (ANTIVERT) 25 MG tablet Take 25 mg by mouth 3 (three) times daily as needed for dizziness.  . nitrofurantoin, macrocrystal-monohydrate, (MACROBID) 100 MG capsule Take 100 mg by mouth 1 day or 1 dose.  . nitroGLYCERIN (NITROSTAT) 0.4 MG SL tablet Place 1 tablet (0.4 mg total) under the tongue every 5 (five) minutes as needed for chest pain.  . pantoprazole (PROTONIX) 40 MG tablet Take 1 tablet (40 mg total) by mouth daily.  . polyethylene glycol (MIRALAX / GLYCOLAX) packet Take 17 g by mouth daily.  . saxagliptin HCl (ONGLYZA) 2.5 MG TABS tablet Take 2.5 mg by mouth daily.  . sertraline (ZOLOFT) 50 MG tablet Take 50 mg by mouth daily.   Marland Kitchen spironolactone (ALDACTONE) 25 MG tablet TAKE ONE TABLET BY MOUTH ONE TIME DAILY  . traMADol (ULTRAM) 50 MG tablet Take 50 mg by mouth every 6 (six) hours as needed for moderate pain.   Marland Kitchen warfarin (COUMADIN) 2.5 MG tablet TAKE AS DIRECTED BY ANTICOAGULATION CLINIC   No facility-administered encounter medications on file as of 07/03/2015.    Functional Status:   In your present state of health, do you have any difficulty performing the following activities: 05/02/2015 04/25/2015  Hearing? Tempie Donning  Vision? Y Y  Difficulty concentrating or making decisions? N N  Walking or climbing stairs? Y Y  Dressing or bathing? N N  Doing errands, shopping? Tempie Donning  Preparing Food and eating ? Y -  Using the Toilet? N -  In the past six months, have you accidently leaked urine? Y -  Do you have problems with loss of bowel control? N -  Managing your Medications? Y -  Managing your Finances? Y -  Housekeeping or managing your Housekeeping? Y -    Fall/Depression Screening:    PHQ 2/9 Scores 05/02/2015  PHQ - 2 Score 0   Fall Risk  07/03/2015 05/02/2015  Falls in the past year? Yes Yes  Number falls in past yr: 2 or more 2 or more  Injury with Fall? No No  Risk Factor Category  High Fall Risk  High Fall Risk  Risk for fall due to : History of fall(s);Impaired balance/gait;Medication side effect History of fall(s);Impaired balance/gait;Medication side effect  Follow up Falls evaluation completed;Falls prevention discussed Falls evaluation completed;Education provided;Falls prevention discussed   Assessment:  RN CM focused on CHF management and will see pt once more next month as pt feels she needs reinforcement.  THN CM Care Plan Problem One        Most Recent Value   Care Plan Problem One  Patient is high risk for hospital readmission related to disease processes   Role Documenting the Problem One  Care Management [place] for Problem One  Active   THN Long Term Goal (31-90 days)  pt will have no hospital readmissions within 90 days   THN Long Term Goal Start Date  04/30/15   Interventions for Problem One Long Term Goal  RN CM reviewed importance of symptom management and calling early for change in health status or symptoms   THN CM Short Term Goal #1 (0-30 days)  pt will verbalize medications for CHF within 30 days.   THN CM Short Term Goal #1 Start Date  07/03/15 Barrie Folk restarted- needs reinforcement]   THN CM Short Term Goal #1 Met Date  -- [pt attended appointment]   Interventions for Short Term Goal #1  RN CM reviewed cardiac medications with pt.   THN CM Short Term Goal #2 (0-30 days)  Pt will verbalize CHF zones within 30 days.   THN CM Short Term Goal #2 Start Date  07/03/15 [goal restarted- needs reinforcement]   Interventions for Short Term Goal #2  RN CM reviewed CHF zones/ action plan      Plan: follow up with home visit 07/31/14 Assess weight log, medications Plan to discharge at May visit  Jacqlyn Larsen [facility], [place] Coordinator [phone]

## 2015-07-06 DIAGNOSIS — L905 Scar conditions and fibrosis of skin: Secondary | ICD-10-CM | POA: Diagnosis not present

## 2015-07-06 DIAGNOSIS — D225 Melanocytic nevi of trunk: Secondary | ICD-10-CM | POA: Diagnosis not present

## 2015-07-11 ENCOUNTER — Other Ambulatory Visit (HOSPITAL_COMMUNITY): Payer: Self-pay | Admitting: *Deleted

## 2015-07-11 DIAGNOSIS — I509 Heart failure, unspecified: Secondary | ICD-10-CM | POA: Diagnosis not present

## 2015-07-11 DIAGNOSIS — R0602 Shortness of breath: Secondary | ICD-10-CM | POA: Diagnosis not present

## 2015-07-11 DIAGNOSIS — I5022 Chronic systolic (congestive) heart failure: Secondary | ICD-10-CM

## 2015-07-11 MED ORDER — FUROSEMIDE 40 MG PO TABS: 40.0000 mg | ORAL_TABLET | Freq: Two times a day (BID) | ORAL | Status: DC

## 2015-07-14 ENCOUNTER — Other Ambulatory Visit: Payer: Self-pay | Admitting: Cardiology

## 2015-07-16 ENCOUNTER — Other Ambulatory Visit (HOSPITAL_COMMUNITY): Payer: Self-pay | Admitting: *Deleted

## 2015-07-16 MED ORDER — LOSARTAN POTASSIUM 25 MG PO TABS: ORAL_TABLET | ORAL | Status: DC

## 2015-07-16 MED ORDER — SPIRONOLACTONE 25 MG PO TABS: 25.0000 mg | ORAL_TABLET | Freq: Every day | ORAL | Status: DC

## 2015-07-16 MED ORDER — ATORVASTATIN CALCIUM 40 MG PO TABS: 40.0000 mg | ORAL_TABLET | Freq: Every day | ORAL | Status: DC

## 2015-07-24 ENCOUNTER — Ambulatory Visit (INDEPENDENT_AMBULATORY_CARE_PROVIDER_SITE_OTHER): Payer: Medicare Other | Admitting: *Deleted

## 2015-07-24 DIAGNOSIS — I213 ST elevation (STEMI) myocardial infarction of unspecified site: Secondary | ICD-10-CM | POA: Diagnosis not present

## 2015-07-24 DIAGNOSIS — Z5181 Encounter for therapeutic drug level monitoring: Secondary | ICD-10-CM | POA: Diagnosis not present

## 2015-07-24 DIAGNOSIS — I513 Intracardiac thrombosis, not elsewhere classified: Secondary | ICD-10-CM

## 2015-07-24 DIAGNOSIS — I253 Aneurysm of heart: Secondary | ICD-10-CM

## 2015-07-24 DIAGNOSIS — Z7901 Long term (current) use of anticoagulants: Secondary | ICD-10-CM | POA: Diagnosis not present

## 2015-07-24 LAB — POCT INR: INR: 2.3

## 2015-07-31 ENCOUNTER — Encounter: Payer: Self-pay | Admitting: *Deleted

## 2015-07-31 ENCOUNTER — Other Ambulatory Visit: Payer: Self-pay | Admitting: *Deleted

## 2015-07-31 VITALS — BP 138/60 | HR 62 | Resp 16 | Wt 142.0 lb

## 2015-07-31 DIAGNOSIS — I509 Heart failure, unspecified: Secondary | ICD-10-CM

## 2015-07-31 NOTE — Patient Outreach (Signed)
[facility]) Care Management   07/31/2015  [name] 09-16-1925 [id number]  [name] is an 82 y.o. female  Subjective: Routine home visit with pt, HIPAA verified, pt reports " I'm doing pretty good", pt continues weighing daily, continues on "low dose antibiotic to prevent urinary tract infection"  Pt states she is to see cardiologist Dr. Deatra Robinson 08/28/15.  Pt states she would like to be monitored by RN health coach citing she "could always use reminders"  Objective:   Filed Vitals:   07/31/15 1359  BP: 138/60  Pulse: 62  Resp: 16  Weight: 142 lb (64.411 kg)  SpO2: 93%   ROS  Physical Exam  Constitutional: She is oriented to person, place, and time. She appears well-developed and well-nourished.  HENT:  Head: Normocephalic.  Neck: Normal range of motion. Neck supple.  Cardiovascular: Normal rate and regular rhythm.   Respiratory: Effort normal and breath sounds normal.  GI: Soft. Bowel sounds are normal.  Musculoskeletal: Normal range of motion. She exhibits no edema.  Neurological: She is alert and oriented to person, place, and time.  Skin: Skin is warm and dry.  Psychiatric: She has a normal mood and affect. Her behavior is normal. Judgment and thought content normal.    Encounter Medications:   Outpatient Encounter Prescriptions as of 07/31/2015  Medication Sig  . acetaminophen (TYLENOL) 325 MG tablet Take 325-650 mg by mouth every 6 (six) hours as needed for mild pain.   Marland Kitchen atorvastatin (LIPITOR) 40 MG tablet Take 1 tablet (40 mg total) by mouth daily at 6 PM.  . carvedilol (COREG) 25 MG tablet TAKE 1 TABLET (25 MG TOTAL) BY MOUTH 2 (TWO) TIMES DAILY.  . cholecalciferol (VITAMIN D) 1000 UNITS tablet Take 1,000 Units by mouth daily.    . folic acid (FOLVITE) 1 MG tablet Take 1 mg by mouth daily.  . furosemide (LASIX) 40 MG tablet Take 1 tablet (40 mg total) by mouth 2 (two) times daily. TAKE  . losartan (COZAAR) 25 MG tablet TAKE 1 TABLET (25 MG  TOTAL) BY MOUTH DAILY.  . meclizine (ANTIVERT) 25 MG tablet Take 25 mg by mouth 3 (three) times daily as needed for dizziness.  . nitrofurantoin, macrocrystal-monohydrate, (MACROBID) 100 MG capsule Take 100 mg by mouth 1 day or 1 dose.  . nitroGLYCERIN (NITROSTAT) 0.4 MG SL tablet Place 1 tablet (0.4 mg total) under the tongue every 5 (five) minutes as needed for chest pain.  . pantoprazole (PROTONIX) 40 MG tablet Take 1 tablet (40 mg total) by mouth daily.  . polyethylene glycol (MIRALAX / GLYCOLAX) packet Take 17 g by mouth daily.  . saxagliptin HCl (ONGLYZA) 2.5 MG TABS tablet Take 2.5 mg by mouth daily.  . sertraline (ZOLOFT) 50 MG tablet Take 50 mg by mouth daily.   Marland Kitchen spironolactone (ALDACTONE) 25 MG tablet Take 1 tablet (25 mg total) by mouth daily.  . traMADol (ULTRAM) 50 MG tablet Take 50 mg by mouth every 6 (six) hours as needed for moderate pain.   Marland Kitchen warfarin (COUMADIN) 2.5 MG tablet TAKE AS DIRECTED BY ANTICOAGULATION CLINIC   No facility-administered encounter medications on file as of 07/31/2015.    Functional Status:   In your present state of health, do you have any difficulty performing the following activities: 05/02/2015 04/25/2015  Hearing? Tempie Donning  Vision? Y Y  Difficulty concentrating or making decisions? N N  Walking or climbing stairs? Y Y  Dressing or bathing? N N  Doing  errands, shopping? Tempie Donning  Preparing Food and eating ? Y -  Using the Toilet? N -  In the past six months, have you accidently leaked urine? Y -  Do you have problems with loss of bowel control? N -  Managing your Medications? Y -  Managing your Finances? Y -  Housekeeping or managing your Housekeeping? Y -    Fall/Depression Screening:    PHQ 2/9 Scores 05/02/2015  PHQ - 2 Score 0    Assessment:  RN CM faxed letter to primary MD Dr. Maudie Mercury informing pt has been transferred to RN health coach for ongoing monitoring, reinforcement.  RN CM praised pt for being proactive with her health and managing well at  home.  Pt has Lif[employer] necklace, weighs daily and taking all medications as prescribed.  Pt is taking onglyza and does not monitor CBG as instructed by MD.  [facility] CM Care Plan Problem One        Most Recent Value   Care Plan Problem One  Patient is high risk for hospital readmission related to disease processes   Role Documenting the Problem One  Care Management [place] for Problem One  Active   THN Long Term Goal (31-90 days)  pt will have no hospital readmissions within 90 days   THN Long Term Goal Start Date  04/30/15   [facility] Long Term Goal Met Date  07/31/15   Interventions for Problem One Long Term Goal  RN CM reinforced symptom management and calling MD early for change in health status, symptoms   THN CM Short Term Goal #1 (0-30 days)  pt will verbalize medications for CHF within 30 days.   THN CM Short Term Goal #1 Start Date  07/31/15 [pt would like ongoing reinforcement]   Interventions for Short Term Goal #1  RN CM reviewed and reinforced cardiac medications with pt and importance of taking as prescribed   THN CM Short Term Goal #2 (0-30 days)  Pt will verbalize CHF zones within 30 days.   THN CM Short Term Goal #2 Start Date  07/03/15   [facility] CM Short Term Goal #2 Met Date  07/31/15   Interventions for Short Term Goal #2  RN CM reviewed / reinforced CHF zones/ action plan      Plan: RN CM transferred to RN health coach. Continue CHF reinforcement, reminders, ask about weight, medications.  Jacqlyn Larsen [facility], [place] Coordinator [phone]

## 2015-08-04 NOTE — Telephone Encounter (Signed)
Patient called stating that her BP was elevated. 170/86. She is an Advanced Heart Failure clinic patient with EF of 15%. I asked her to take an additional cozaar 25 mg today. Call back around 4pm if BP to let us know how she is doing.

## 2015-08-06 ENCOUNTER — Telehealth (HOSPITAL_COMMUNITY): Payer: Self-pay | Admitting: Cardiology

## 2015-08-06 NOTE — Telephone Encounter (Signed)
OK to increase losartan to 50 mg daily and follow up Wednesday.   If patient has Headache, blurred vision, unilateral weakness, slurred speech should call 911 immediately, should NOT drive herself to ED.   If she does not feel like she can wait this long or symptoms worsen, please report to ED.  Legrand Como [address]" [place], PA-C 08/06/2015 3:40 PM

## 2015-08-06 NOTE — Telephone Encounter (Signed)
PATIENT CALLED TO REPORT INCREASE SOB AND INCREASED B/P (180'S AND 190'S) PATIENT REPORTS SOMETHING IS JUST NOT RIGHT  ANSWERING SERVICE ADVISED PATIENT TO TAKE AN ADDITIONAL COZAAR AND FOLLOW UP WITH OFFICE ASAP  PATIENT REPORTS WEIGHT IS STABLE BUT IS HAVING A DIFFICULT TIME TALKING DUE TO SOB ADVISED FIRST AVAILABLE IS 5/17 WITH ANDY TILLERY,PA IF [facility]XS GET WORSE REPORT TO ER FOR FURTHER EVALUATION   PLEASE ADVISED FURTHER IF NEEDED

## 2015-08-07 NOTE — Progress Notes (Signed)
Patient ID: [name], female   DOB: [birth year], 82 y.o.   MRN: [id number]    Advanced Heart Failure Clinic Note   PCP: Dr. Maudie Mercury Cardiology: Dr. Aundra Dubin  82 yo with history of CAD, ischemic CMP and apical aneurysm with LV thrombus presents for cardiology followup. Last echo in 7/13 showed EF 25-30% with apical aneurysm.  Lexiscan myoview in 7/13 showed a large fixed inferolateral defect and a large predominantly fixed apical defect. EF 27% There was no significant ischemia and she was managed medically.    Seen by EP in 4/15 and decided against having an ICD generator change.  Cardiolite in 6/16 showed large apical scar but no ischemia.    She presents today as an add on for increase BP and malaise. States BP at home had been as high as 180-190s. We increase losartan to 50 mg daily. She feels much better with increase in losartan.  Most recent check at home of SBP (last night) was 147. Breathing has improved and less malaise.  She is on chronic suppression for frequent UTIs. She denies fever or chills. She has occasional chest pain, but this is mostly related to food and relieved by TUMs. She occasionally takes a NTG tablet, but the pain almost always occurs at rest. She states she did feel like her heart was pounding when her pressure up. She sleeps in a recliner (has done this for years).     Labs (12/11): K 4.1, creatinine 1.1 Labs (4/12): K 4.6, creatinine 1.2, BUN 43, BNP 271 Labs (6/12): K 4, creatinine 1.3, LDL 66, HDL 44 Labs (10/12): K 4.8, creatinine 1.3 Labs (3/13): K 4.7, creatinine 1.4 Labs (4/13): K 4.6, creatinine 1.22, LDL 71, HDL 45 Labs (7/13): K 4.2, creatinine 1.2 Labs (10/13): K 3.8, creatinine 1.1, BNP 171 Labs (11/13): K 4, creatinine 1.2, LDL 80, HDL 46 Labs (1/14): K 3.9, creatinine 1.2 Labs (2/14): LDL 72, HDL 50 Labs (5/14): K 4, creatinine 1.5, BNP 214 Labs (9/14): K 4.4, creatinine 1.4, BNP 220, LDL 82, HDL 44 Labs (10/14): K 3.7, creatinine 1.5 Labs  (1/15): K 4.3, creatinine 1.44, BNP 944 Labs (7/15): creatinine 1.5, LFTs normal, hemoglobin 12.5, LDL 95, HDL 42 Labs (11/17/13): creatinine 1.8, K+ 4.4,  Labs (12/15): K+ 4.8 creatinine 1.32 LDL 76 HDL 44 Labs (9/16): K 4.5, creatinine 1.65, hgb 11.5, LDL 88, HDL 40 Labs (11/16): K 4.7, creatinine 1.93  Allergies (verified):  1)  ! Codeine  Past Medical History: 1. HYPERCHOLESTEROLEMIA  2. ARTHRITIS: Severe low back pain.  3. ISCHEMIC CARDIOMYOPATHY: Echo (1/10) with EF 25-30%, mildly dilated LV, periapical LV aneurysm, calcified LV thrombus, moderate MR.  Medtronic ICD set to VVI.  Echo (7/12) with EF 25-30%, mid-apical anteroseptal, apical lateral, apical anterior akinesis and aneurysm of the true apex; no thrombus noted, moderate MR, PA systolic pressure 30 mmHg.  Echo 07/19/14  EF 25-30% with akinesis of distal half of LV, no apical clot, RV ok, moderate MR.  4. CAD: Anterior MI in 2006 with Cypher DES to LAD.  Lexiscan myoview (7/13) with large fixed inferolateral defect and large predominantly fixed apical defect.  There was minimal ischemia so she was managed medically.  Lexiscan Cardiolite (6/16) with EF 19%, large fixed apical defect, no ischemia.  5. HYPERTENSION 6. LV thrombus on coumadin.  7. Macular degeneration 8. CKD 9. Palpitations: Holter (9/14) with PACs and PVCs, no atrial fibrillation.  10. BPPV  Social History: The patient lives alone near [place]. 3 daughters live  nearby. Widowed Tobacco Use - No.  Alcohol Use - no  Family History:  CAD  ROS:  All systems reviewed and negative except as per HPI.    Current Outpatient Prescriptions  Medication Sig Dispense Refill  . acetaminophen (TYLENOL) 325 MG tablet Take 325-650 mg by mouth every 6 (six) hours as needed for mild pain.     Marland Kitchen atorvastatin (LIPITOR) 40 MG tablet Take 1 tablet (40 mg total) by mouth daily at 6 PM. 30 tablet 4  . carvedilol (COREG) 25 MG tablet TAKE 1 TABLET (25 MG TOTAL) BY MOUTH 2 (TWO)  TIMES DAILY. 180 tablet 3  . cholecalciferol (VITAMIN D) 1000 UNITS tablet Take 1,000 Units by mouth daily.      . folic acid (FOLVITE) 1 MG tablet Take 1 mg by mouth daily.    . furosemide (LASIX) 40 MG tablet Take 20-40 mg by mouth 2 (two) times daily. Take 40 mg (1 tablet) in the morning and 20 mg (1/2 tablet) in the afternoon    . losartan (COZAAR) 25 MG tablet TAKE 1 TABLET (25 MG TOTAL) BY MOUTH DAILY. 90 tablet 0  . meclizine (ANTIVERT) 25 MG tablet Take 25 mg by mouth 3 (three) times daily as needed for dizziness.    . nitrofurantoin, macrocrystal-monohydrate, (MACROBID) 100 MG capsule Take 100 mg by mouth daily.     . pantoprazole (PROTONIX) 40 MG tablet Take 1 tablet (40 mg total) by mouth daily. 90 tablet 2  . polyethylene glycol (MIRALAX / GLYCOLAX) packet Take 17 g by mouth daily. 14 each 0  . saxagliptin HCl (ONGLYZA) 2.5 MG TABS tablet Take 2.5 mg by mouth daily.    . sertraline (ZOLOFT) 50 MG tablet Take 50 mg by mouth daily.     Marland Kitchen spironolactone (ALDACTONE) 25 MG tablet Take 1 tablet (25 mg total) by mouth daily. 90 tablet 1  . traMADol (ULTRAM) 50 MG tablet Take 50 mg by mouth every 6 (six) hours as needed for moderate pain.     Marland Kitchen warfarin (COUMADIN) 2.5 MG tablet TAKE AS DIRECTED BY ANTICOAGULATION CLINIC 30 tablet 3  . nitroGLYCERIN (NITROSTAT) 0.4 MG SL tablet Place 1 tablet (0.4 mg total) under the tongue every 5 (five) minutes as needed for chest pain. (Patient not taking: Reported on 08/08/2015) 25 tablet 3   No current facility-administered medications for this encounter.    BP 114/72 mmHg  Pulse 66  Wt 142 lb 9.6 oz (64.683 kg)  SpO2 93%   Wt Readings from Last 3 Encounters:  08/08/15 142 lb 9.6 oz (64.683 kg)  07/31/15 142 lb (64.411 kg)  07/03/15 142 lb (64.411 kg)    General:  Elderly woman in no distress.  Neck:  Neck supple, JVP 6-7 cm Lungs:  Clear, normal effort Heart:  Non-displaced PMI, chest non-tender; regular rate and rhythm, S1, S2 without rubs or  gallops. 2/6 early SEM at RUSB.  Carotid upstroke normal, no bruit.   Abdomen:  Soft, NT, ND, no HSM. No bruits or masses. +BS  Extremities:  No clubbing or cyanosis. No edema.  Neurologic:  Alert and oriented x 3. Psych:  Normal affect.  Assessment/Plan:  1. CAD Stable, non\ exertional chest pain.   - Continue atorvastatin.  - No ASA given stable CAD and on warfarin.  2. HYPERLIPIDEMIA Continue statin.  LDL not ideal in 9/16, but acceptable.  3. Chronic systolic CHF  Ischemic cardiomyopathy.  EF 25-30% on last echo, 19% by Cardiolite.  NYHA class III symptoms,  stable. She is not volume overloaded on higher dose of Lasix.  - Continue spironolactone 25 mg and coreg 25 mg BID.  - She has decided against changing her ICD generator but will not have it explanted - Continue Lasix 40 mg bid.  Check BMET/BNP. 4. H/o LV apical aneurysm with thrombus History of apical aneurysm with thrombus, thrombus not seen on last echo. Continue warfarin.  5. HTN  - Much improved on increased losartan. Continue losartan 50 mg daily. If her creatinine is up, may need to try hydralazine to better control pressure.   Will recheck her BMET next Friday. (10 days) - Other meds as above.  6 CKD stage 3 - Follow closely with med changes 7. Imbalance Awaiting  home PT for balance training.   Keep appt for 3 weeks. Med changes as above. BMET today and recheck next week.  Satira Mccallum Nylia Gavina  PA-C 08/08/2015

## 2015-08-07 NOTE — Telephone Encounter (Signed)
Patient aware and voiced understanding

## 2015-08-08 ENCOUNTER — Ambulatory Visit (HOSPITAL_COMMUNITY)
Admission: RE | Admit: 2015-08-08 | Discharge: 2015-08-08 | Disposition: A | Discharge status: Home / Self Care | Payer: Medicare Other | Source: Ambulatory Visit | Attending: Internal Medicine | Admitting: Internal Medicine

## 2015-08-08 VITALS — BP 114/72 | HR 66 | Wt 142.6 lb

## 2015-08-08 DIAGNOSIS — I252 Old myocardial infarction: Secondary | ICD-10-CM | POA: Insufficient documentation

## 2015-08-08 DIAGNOSIS — R2689 Other abnormalities of gait and mobility: Secondary | ICD-10-CM | POA: Insufficient documentation

## 2015-08-08 DIAGNOSIS — Z86718 Personal history of other venous thrombosis and embolism: Secondary | ICD-10-CM | POA: Diagnosis not present

## 2015-08-08 DIAGNOSIS — Z955 Presence of coronary angioplasty implant and graft: Secondary | ICD-10-CM | POA: Diagnosis not present

## 2015-08-08 DIAGNOSIS — H3530 Unspecified macular degeneration: Secondary | ICD-10-CM | POA: Insufficient documentation

## 2015-08-08 DIAGNOSIS — I2510 Atherosclerotic heart disease of native coronary artery without angina pectoris: Secondary | ICD-10-CM | POA: Diagnosis not present

## 2015-08-08 DIAGNOSIS — I5022 Chronic systolic (congestive) heart failure: Secondary | ICD-10-CM | POA: Diagnosis not present

## 2015-08-08 DIAGNOSIS — I10 Essential (primary) hypertension: Secondary | ICD-10-CM | POA: Diagnosis not present

## 2015-08-08 DIAGNOSIS — Z8249 Family history of ischemic heart disease and other diseases of the circulatory system: Secondary | ICD-10-CM | POA: Insufficient documentation

## 2015-08-08 DIAGNOSIS — I130 Hypertensive heart and chronic kidney disease with heart failure and stage 1 through stage 4 chronic kidney disease, or unspecified chronic kidney disease: Secondary | ICD-10-CM | POA: Diagnosis not present

## 2015-08-08 DIAGNOSIS — Z7901 Long term (current) use of anticoagulants: Secondary | ICD-10-CM | POA: Insufficient documentation

## 2015-08-08 DIAGNOSIS — E785 Hyperlipidemia, unspecified: Secondary | ICD-10-CM | POA: Diagnosis not present

## 2015-08-08 DIAGNOSIS — N183 Chronic kidney disease, stage 3 unspecified: Secondary | ICD-10-CM

## 2015-08-08 DIAGNOSIS — I255 Ischemic cardiomyopathy: Secondary | ICD-10-CM | POA: Insufficient documentation

## 2015-08-08 DIAGNOSIS — Z79899 Other long term (current) drug therapy: Secondary | ICD-10-CM | POA: Insufficient documentation

## 2015-08-08 DIAGNOSIS — I2584 Coronary atherosclerosis due to calcified coronary lesion: Secondary | ICD-10-CM

## 2015-08-08 LAB — BASIC METABOLIC PANEL
ANION GAP: 13 (ref 5–15)
BUN: 30 mg/dL — AB (ref 6–20)
CALCIUM: 9.4 mg/dL (ref 8.9–10.3)
CO2: 28 mmol/L (ref 22–32)
Chloride: 102 mmol/L (ref 101–111)
Creatinine, Ser: 1.2 mg/dL — ABNORMAL HIGH (ref 0.44–1.00)
GFR calc Af Amer: 45 mL/min — ABNORMAL LOW (ref >60)
GFR calc non Af Amer: 39 mL/min — ABNORMAL LOW (ref >60)
GLUCOSE: 111 mg/dL — AB (ref 65–99)
POTASSIUM: 3.6 mmol/L (ref 3.5–5.1)
Sodium: 143 mmol/L (ref 135–145)

## 2015-08-08 MED ORDER — LOSARTAN POTASSIUM 50 MG PO TABS: 50.0000 mg | ORAL_TABLET | Freq: Every day | ORAL | Status: DC

## 2015-08-08 NOTE — Patient Instructions (Signed)
Labs today and again in 10 days  CONTINUE increased dose of Losartan 50 mg, one tab daily  Your physician recommends that you schedule a follow-up appointment as scheduled   Do the following things EVERYDAY: 1) Weigh yourself in the morning before breakfast. Write it down and keep it in a log. 2) Take your medicines as prescribed 3) Eat low salt foods-Limit salt (sodium) to 2000 mg per day.  4) Stay as active as you can everyday 5) Limit all fluids for the day to less than 2 liters 6)

## 2015-08-08 NOTE — Progress Notes (Signed)
Advanced Heart Failure Medication Review by a Pharmacist  Does the patient  feel that his/her medications are working for him/her?  yes  Has the patient been experiencing any side effects to the medications prescribed?  no  Does the patient measure his/her own blood pressure or blood glucose at home?  yes   Does the patient have any problems obtaining medications due to transportation or finances?   no  Understanding of regimen: good Understanding of indications: good Potential of compliance: good Patient understands to avoid NSAIDs. Patient understands to avoid decongestants.  Issues to address at subsequent visits: None   Pharmacist comments:  Ms. Anzaldua is an 82 yo F presenting with her daughter and without a medication list. She reports good compliance with her regimen and did not have any specific medication-related questions or concerns for me at this time.   Ruta Hinds. Velva Harman, PharmD, BCPS, CPP Clinical Pharmacist Pager: [phone] Phone: [phone] 08/08/2015 9:52 AM      Time with patient: 8 minutes Preparation and documentation time: 2 minutes Total time: 10 minutes

## 2015-08-10 DIAGNOSIS — R0602 Shortness of breath: Secondary | ICD-10-CM | POA: Diagnosis not present

## 2015-08-10 DIAGNOSIS — I509 Heart failure, unspecified: Secondary | ICD-10-CM | POA: Diagnosis not present

## 2015-08-17 ENCOUNTER — Ambulatory Visit (HOSPITAL_COMMUNITY)
Admission: RE | Admit: 2015-08-17 | Discharge: 2015-08-17 | Disposition: A | Discharge status: Home / Self Care | Payer: Medicare Other | Source: Ambulatory Visit | Attending: Cardiology | Admitting: Cardiology

## 2015-08-17 DIAGNOSIS — I5022 Chronic systolic (congestive) heart failure: Secondary | ICD-10-CM | POA: Diagnosis not present

## 2015-08-17 LAB — BASIC METABOLIC PANEL
Anion gap: 8 (ref 5–15)
BUN: 29 mg/dL — ABNORMAL HIGH (ref 6–20)
CALCIUM: 10 mg/dL (ref 8.9–10.3)
CO2: 29 mmol/L (ref 22–32)
CREATININE: 1.18 mg/dL — AB (ref 0.44–1.00)
Chloride: 107 mmol/L (ref 101–111)
GFR, EST AFRICAN AMERICAN: 46 mL/min — AB (ref >60)
GFR, EST NON AFRICAN AMERICAN: 40 mL/min — AB (ref >60)
GLUCOSE: 108 mg/dL — AB (ref 65–99)
Potassium: 4 mmol/L (ref 3.5–5.1)
Sodium: 144 mmol/L (ref 135–145)

## 2015-08-21 DIAGNOSIS — N390 Urinary tract infection, site not specified: Secondary | ICD-10-CM | POA: Diagnosis not present

## 2015-08-22 ENCOUNTER — Telehealth: Payer: Self-pay | Admitting: *Deleted

## 2015-08-22 ENCOUNTER — Other Ambulatory Visit: Payer: Self-pay

## 2015-08-22 DIAGNOSIS — I509 Heart failure, unspecified: Secondary | ICD-10-CM

## 2015-08-22 NOTE — Patient Outreach (Signed)
[facility]) Care Management  08/22/2015  [name] PAMPLONA 1925-05-29 [id number]   Scheduled telephone contact with patient referred from community nurse for disease management of CHF.  Patient was expecting my call, and said she is pleased that she was referred for health coach services.  Patient reports she is not feeling well today.  States she went to her PCP yesterday and was diagnosed with a kidney infection.  Patient states she is taking prescribed antibiotic and thinks she is beginning to improve.  Education provided concerning fluid intake- she states she likes to drink [facility] but the doctor discouraged drinking sodas.  Encouraged her to drink plenty of water.  Patient reports that she has not weighed for the past few days, and that she skipped some of her medications.  Encouraged patient to resume daily weights,  and take medications as ordered.  She stated she did take her medications today, and will resume taking meds as ordered.  Patient stated she really was not up to talking at length today.  She requested we reschedule our intake assessment for week after next.  Encouraged patient to call prn- she has my contact information.    Plan:  Patient will adher to treatment plan for kidney infection, and report any worsening of symptoms to PCP.           Patient will resume daily weights and taking prescribed medications.           RN will follow up in approximately 2 weeks.  Candie Mile, RN, MSN [place] [phone] Fax [phone]

## 2015-08-22 NOTE — Telephone Encounter (Signed)
Spoke with pt and she states she has been placed on  Amox/clav 500mg /125 tab 1 bid for 10 days started on 08/21/2015  For UTI . Pt instructed that this antibiotic and coumadin do not interact and to continue take coumadin as ordered and Amox/clav as ordered and to keep her appt in coumadin clinic and she states understanding

## 2015-08-24 ENCOUNTER — Ambulatory Visit (INDEPENDENT_AMBULATORY_CARE_PROVIDER_SITE_OTHER): Payer: Medicare Other | Admitting: *Deleted

## 2015-08-24 DIAGNOSIS — I253 Aneurysm of heart: Secondary | ICD-10-CM

## 2015-08-24 DIAGNOSIS — Z7901 Long term (current) use of anticoagulants: Secondary | ICD-10-CM

## 2015-08-24 DIAGNOSIS — Z5181 Encounter for therapeutic drug level monitoring: Secondary | ICD-10-CM

## 2015-08-24 DIAGNOSIS — I513 Intracardiac thrombosis, not elsewhere classified: Secondary | ICD-10-CM

## 2015-08-24 DIAGNOSIS — I213 ST elevation (STEMI) myocardial infarction of unspecified site: Secondary | ICD-10-CM | POA: Diagnosis not present

## 2015-08-24 LAB — POCT INR: INR: 2.6

## 2015-08-28 ENCOUNTER — Encounter (HOSPITAL_COMMUNITY): Payer: Self-pay

## 2015-08-28 ENCOUNTER — Ambulatory Visit (HOSPITAL_COMMUNITY)
Admission: RE | Admit: 2015-08-28 | Discharge: 2015-08-28 | Disposition: A | Discharge status: Home / Self Care | Payer: Medicare Other | Source: Ambulatory Visit | Attending: Cardiology | Admitting: Cardiology

## 2015-08-28 VITALS — BP 114/66 | HR 65 | Wt 140.2 lb

## 2015-08-28 DIAGNOSIS — I2510 Atherosclerotic heart disease of native coronary artery without angina pectoris: Secondary | ICD-10-CM | POA: Insufficient documentation

## 2015-08-28 DIAGNOSIS — I5022 Chronic systolic (congestive) heart failure: Secondary | ICD-10-CM | POA: Diagnosis not present

## 2015-08-28 DIAGNOSIS — Z885 Allergy status to narcotic agent status: Secondary | ICD-10-CM | POA: Insufficient documentation

## 2015-08-28 DIAGNOSIS — R0789 Other chest pain: Secondary | ICD-10-CM | POA: Insufficient documentation

## 2015-08-28 DIAGNOSIS — Z955 Presence of coronary angioplasty implant and graft: Secondary | ICD-10-CM | POA: Insufficient documentation

## 2015-08-28 DIAGNOSIS — H3530 Unspecified macular degeneration: Secondary | ICD-10-CM | POA: Diagnosis not present

## 2015-08-28 DIAGNOSIS — I130 Hypertensive heart and chronic kidney disease with heart failure and stage 1 through stage 4 chronic kidney disease, or unspecified chronic kidney disease: Secondary | ICD-10-CM | POA: Diagnosis not present

## 2015-08-28 DIAGNOSIS — E785 Hyperlipidemia, unspecified: Secondary | ICD-10-CM | POA: Diagnosis not present

## 2015-08-28 DIAGNOSIS — I252 Old myocardial infarction: Secondary | ICD-10-CM | POA: Insufficient documentation

## 2015-08-28 DIAGNOSIS — M25551 Pain in right hip: Secondary | ICD-10-CM | POA: Diagnosis not present

## 2015-08-28 DIAGNOSIS — M1990 Unspecified osteoarthritis, unspecified site: Secondary | ICD-10-CM | POA: Insufficient documentation

## 2015-08-28 DIAGNOSIS — N183 Chronic kidney disease, stage 3 unspecified: Secondary | ICD-10-CM

## 2015-08-28 DIAGNOSIS — Z7901 Long term (current) use of anticoagulants: Secondary | ICD-10-CM | POA: Insufficient documentation

## 2015-08-28 DIAGNOSIS — N390 Urinary tract infection, site not specified: Secondary | ICD-10-CM | POA: Diagnosis not present

## 2015-08-28 DIAGNOSIS — E7800 Pure hypercholesterolemia, unspecified: Secondary | ICD-10-CM | POA: Diagnosis not present

## 2015-08-28 DIAGNOSIS — Z8249 Family history of ischemic heart disease and other diseases of the circulatory system: Secondary | ICD-10-CM | POA: Diagnosis not present

## 2015-08-28 DIAGNOSIS — I255 Ischemic cardiomyopathy: Secondary | ICD-10-CM | POA: Insufficient documentation

## 2015-08-28 DIAGNOSIS — Z79899 Other long term (current) drug therapy: Secondary | ICD-10-CM | POA: Insufficient documentation

## 2015-08-28 LAB — CBC
HCT: 35.4 % — ABNORMAL LOW (ref 36.0–46.0)
Hemoglobin: 11.1 g/dL — ABNORMAL LOW (ref 12.0–15.0)
MCH: 29.4 pg (ref 26.0–34.0)
MCHC: 31.4 g/dL (ref 30.0–36.0)
MCV: 93.9 fL (ref 78.0–100.0)
PLATELETS: 155 10*3/uL (ref 150–400)
RBC: 3.77 MIL/uL — AB (ref 3.87–5.11)
RDW: 13.7 % (ref 11.5–15.5)
WBC: 6.9 10*3/uL (ref 4.0–10.5)

## 2015-08-28 LAB — BASIC METABOLIC PANEL
Anion gap: 9 (ref 5–15)
BUN: 27 mg/dL — ABNORMAL HIGH (ref 6–20)
CHLORIDE: 104 mmol/L (ref 101–111)
CO2: 27 mmol/L (ref 22–32)
CREATININE: 1.26 mg/dL — AB (ref 0.44–1.00)
Calcium: 9.4 mg/dL (ref 8.9–10.3)
GFR calc non Af Amer: 37 mL/min — ABNORMAL LOW (ref >60)
GFR, EST AFRICAN AMERICAN: 43 mL/min — AB (ref >60)
GLUCOSE: 98 mg/dL (ref 65–99)
Potassium: 3.7 mmol/L (ref 3.5–5.1)
Sodium: 140 mmol/L (ref 135–145)

## 2015-08-28 LAB — LIPID PANEL
CHOL/HDL RATIO: 3.9 ratio
CHOLESTEROL: 135 mg/dL (ref 0–200)
HDL: 35 mg/dL — AB (ref >40)
LDL Cholesterol: 76 mg/dL (ref 0–99)
TRIGLYCERIDES: 118 mg/dL (ref <150)
VLDL: 24 mg/dL (ref 0–40)

## 2015-08-28 NOTE — Patient Instructions (Signed)
Routine lab work today. Will notify you of abnormal results  Follow up with Dr.McLean in 3 months.  

## 2015-08-28 NOTE — Progress Notes (Signed)
Patient ID: [name], female   DOB: 1925-06-17, 82 y.o.   MRN: [id number]    Advanced Heart Failure Clinic Note   PCP: Dr. Maudie Mercury Cardiology: Dr. Aundra Dubin  82 yo with history of CAD, ischemic CMP and apical aneurysm with LV thrombus presents for cardiology followup. Last echo in 7/13 showed EF 25-30% with apical aneurysm.  Lexiscan myoview in 7/13 showed a large fixed inferolateral defect and a large predominantly fixed apical defect. EF 27% There was no significant ischemia and she was managed medically.    Seen by EP in 4/15 and decided against having an ICD generator change.  Cardiolite in 6/16 showed large apical scar but no ischemia.    She has been stable recently.  She continues to have periodic episodes of nonexertional chest pain.  No change in pattern.  She is able to walk around the house and to the car without dyspnea using her walker.  Uses oxygen at night.  Weight is down 2 lbs.  She has been sleeping long-term in a recliner.      Labs (12/11): K 4.1, creatinine 1.1 Labs (4/12): K 4.6, creatinine 1.2, BUN 43, BNP 271 Labs (6/12): K 4, creatinine 1.3, LDL 66, HDL 44 Labs (10/12): K 4.8, creatinine 1.3 Labs (3/13): K 4.7, creatinine 1.4 Labs (4/13): K 4.6, creatinine 1.22, LDL 71, HDL 45 Labs (7/13): K 4.2, creatinine 1.2 Labs (10/13): K 3.8, creatinine 1.1, BNP 171 Labs (11/13): K 4, creatinine 1.2, LDL 80, HDL 46 Labs (1/14): K 3.9, creatinine 1.2 Labs (2/14): LDL 72, HDL 50 Labs (5/14): K 4, creatinine 1.5, BNP 214 Labs (9/14): K 4.4, creatinine 1.4, BNP 220, LDL 82, HDL 44 Labs (10/14): K 3.7, creatinine 1.5 Labs (1/15): K 4.3, creatinine 1.44, BNP 944 Labs (7/15): creatinine 1.5, LFTs normal, hemoglobin 12.5, LDL 95, HDL 42 Labs (11/17/13): creatinine 1.8, K+ 4.4,  Labs (12/15): K+ 4.8 creatinine 1.32 LDL 76 HDL 44 Labs (9/16): K 4.5, creatinine 1.65, hgb 11.5, LDL 88, HDL 40 Labs (11/16): K 4.7, creatinine 1.93 Labs (5/17): K 4, creatinine 1.18  Allergies  (verified):  1)  ! Codeine  Past Medical History: 1. HYPERCHOLESTEROLEMIA  2. ARTHRITIS: Severe low back pain.  3. ISCHEMIC CARDIOMYOPATHY: Echo (1/10) with EF 25-30%, mildly dilated LV, periapical LV aneurysm, calcified LV thrombus, moderate MR.  Medtronic ICD set to VVI.  Echo (7/12) with EF 25-30%, mid-apical anteroseptal, apical lateral, apical anterior akinesis and aneurysm of the true apex; no thrombus noted, moderate MR, PA systolic pressure 30 mmHg.  Echo 07/19/14  EF 25-30% with akinesis of distal half of LV, no apical clot, RV ok, moderate MR.  4. CAD: Anterior MI in 2006 with Cypher DES to LAD.  Lexiscan myoview (7/13) with large fixed inferolateral defect and large predominantly fixed apical defect.  There was minimal ischemia so she was managed medically.  Lexiscan Cardiolite (6/16) with EF 19%, large fixed apical defect, no ischemia.  5. HYPERTENSION 6. LV thrombus on coumadin.  7. Macular degeneration 8. CKD 9. Palpitations: Holter (9/14) with PACs and PVCs, no atrial fibrillation.  10. BPPV  Social History: The patient lives alone near [place]. 3 daughters live nearby. Widowed Tobacco Use - No.  Alcohol Use - no  Family History:  CAD  ROS:  All systems reviewed and negative except as per HPI.    Current Outpatient Prescriptions  Medication Sig Dispense Refill  . acetaminophen (TYLENOL) 325 MG tablet Take 325-650 mg by mouth every 6 (six) hours as needed  for mild pain.     Marland Kitchen atorvastatin (LIPITOR) 40 MG tablet Take 1 tablet (40 mg total) by mouth daily at 6 PM. 30 tablet 4  . carvedilol (COREG) 25 MG tablet TAKE 1 TABLET (25 MG TOTAL) BY MOUTH 2 (TWO) TIMES DAILY. 180 tablet 3  . cholecalciferol (VITAMIN D) 1000 UNITS tablet Take 1,000 Units by mouth daily.      . folic acid (FOLVITE) 1 MG tablet Take 1 mg by mouth daily.    . furosemide (LASIX) 40 MG tablet Take 20-40 mg by mouth 2 (two) times daily. Take 40 mg (1 tablet) in the morning and 20 mg (1/2 tablet) in the  afternoon    . losartan (COZAAR) 50 MG tablet Take 1 tablet (50 mg total) by mouth daily. 30 tablet 3  . meclizine (ANTIVERT) 25 MG tablet Take 25 mg by mouth 3 (three) times daily as needed for dizziness.    . nitrofurantoin, macrocrystal-monohydrate, (MACROBID) 100 MG capsule Take 100 mg by mouth daily.     . nitroGLYCERIN (NITROSTAT) 0.4 MG SL tablet Place 1 tablet (0.4 mg total) under the tongue every 5 (five) minutes as needed for chest pain. 25 tablet 3  . pantoprazole (PROTONIX) 40 MG tablet Take 1 tablet (40 mg total) by mouth daily. 90 tablet 2  . polyethylene glycol (MIRALAX / GLYCOLAX) packet Take 17 g by mouth daily. 14 each 0  . saxagliptin HCl (ONGLYZA) 2.5 MG TABS tablet Take 2.5 mg by mouth daily.    . sertraline (ZOLOFT) 50 MG tablet Take 50 mg by mouth daily.     Marland Kitchen spironolactone (ALDACTONE) 25 MG tablet Take 1 tablet (25 mg total) by mouth daily. 90 tablet 1  . traMADol (ULTRAM) 50 MG tablet Take 50 mg by mouth every 6 (six) hours as needed for moderate pain.     Marland Kitchen warfarin (COUMADIN) 2.5 MG tablet TAKE AS DIRECTED BY ANTICOAGULATION CLINIC 30 tablet 3   No current facility-administered medications for this encounter.    BP 114/66 mmHg  Pulse 65  Wt 140 lb 4 oz (63.617 kg)  SpO2 97%   Wt Readings from Last 3 Encounters:  08/28/15 140 lb 4 oz (63.617 kg)  08/08/15 142 lb 9.6 oz (64.683 kg)  07/31/15 142 lb (64.411 kg)    General:  Elderly woman in no distress.  Neck:  Neck supple, JVP 6-7 cm Lungs:  Clear, normal effort Heart:  Non-displaced PMI, chest non-tender; regular rate and rhythm, S1, S2 without rubs or gallops. 1/6 early SEM at RUSB.  Carotid upstroke normal, no bruit.   Abdomen:  Soft, NT, ND, no HSM. No bruits or masses. +BS  Extremities:  No clubbing or cyanosis. No edema.  Neurologic:  Alert and oriented x 3. Psych:  Normal affect.  Assessment/Plan:  1. CAD Stable, non-exertional chest pain.   - Continue atorvastatin.  - No ASA given stable CAD  and on warfarin.  2. HYPERLIPIDEMIA Continue statin.  Check LDL today.  3. Chronic systolic CHF  Ischemic cardiomyopathy.  EF 25-30% on last echo, 19% by Cardiolite.  NYHA class II-III symptoms, stable. She is not volume overloaded current dose of Lasix.  - Continue spironolactone 25 mg, losartan 50 daily, and coreg 25 mg BID.  - She has decided against changing her ICD generator but will not have it explanted - Continue Lasix 40 qam/20 qpm.  Check BMET/BNP. 4. H/o LV apical aneurysm with thrombus History of apical aneurysm with thrombus, thrombus not seen on  last echo. Continue warfarin, CBC today.  5. HTN  Much improved on increased losartan. Continue losartan 50 mg daily.  - Other meds as above.  6 CKD stage 3 Follow closely with med changes. CBC today.   Followup in 3 months.   Loralie Champagne  08/28/2015

## 2015-08-29 ENCOUNTER — Other Ambulatory Visit (HOSPITAL_COMMUNITY): Payer: Self-pay | Admitting: *Deleted

## 2015-08-29 MED ORDER — ATORVASTATIN CALCIUM 40 MG PO TABS: 40.0000 mg | ORAL_TABLET | Freq: Every day | ORAL | Status: DC

## 2015-09-04 ENCOUNTER — Other Ambulatory Visit: Payer: Self-pay

## 2015-09-04 DIAGNOSIS — N390 Urinary tract infection, site not specified: Secondary | ICD-10-CM | POA: Diagnosis not present

## 2015-09-04 DIAGNOSIS — I10 Essential (primary) hypertension: Secondary | ICD-10-CM | POA: Diagnosis not present

## 2015-09-04 NOTE — Patient Outreach (Signed)
[facility]) Care Management  09/04/2015  TAKYRAH HOLLINGSWORTH [birth year] [id number]  Telephone call for completion of initial assessment.  However, patient reports she is getting ready to go for a follow-up visit at Dr. Julianne Rice office to be sure a recent UTI has resolved.  Patient requested a call back tomorrow.  Appointment rescheduled for June 14th.  Candie Mile, RN, MSN [place] [phone] Fax [phone]

## 2015-09-05 ENCOUNTER — Other Ambulatory Visit: Payer: Self-pay

## 2015-09-05 NOTE — Patient Outreach (Signed)
[place] [facility]) Care Management  [place]  09/05/2015   [name] 07/27/1925 [id number]  Initial assessment completed today.  Patient went to PCP yesterday to follow up on UTI treatment outcome. States she feels better and thinks it has cleared up.  She has to take another urine specimen to PCP.  Patient referred for disease management for CHF after being followed for TOC past hospitalization.  She reports she is taking her medications and weighing daily.  She does have a scale that transmits data to the cardiologist. Patient reports she lives alone, and that she is ambulatory with a walker, needs some assistance with ADLs, and uses Oxygen at night and prn during the day.  She has daughters nearby to provide assistance.  Encounter Medications:  Outpatient Encounter Prescriptions as of 09/05/2015  Medication Sig  . acetaminophen (TYLENOL) 325 MG tablet Take 325-650 mg by mouth every 6 (six) hours as needed for mild pain.   Marland Kitchen atorvastatin (LIPITOR) 40 MG tablet Take 1 tablet (40 mg total) by mouth daily at 6 PM.  . carvedilol (COREG) 25 MG tablet TAKE 1 TABLET (25 MG TOTAL) BY MOUTH 2 (TWO) TIMES DAILY.  . cholecalciferol (VITAMIN D) 1000 UNITS tablet Take 1,000 Units by mouth daily.    . folic acid (FOLVITE) 1 MG tablet Take 1 mg by mouth daily.  . furosemide (LASIX) 40 MG tablet Take 20-40 mg by mouth 2 (two) times daily. Take 40 mg (1 tablet) in the morning and 20 mg (1/2 tablet) in the afternoon  . losartan (COZAAR) 50 MG tablet Take 1 tablet (50 mg total) by mouth daily.  . meclizine (ANTIVERT) 25 MG tablet Take 25 mg by mouth 3 (three) times daily as needed for dizziness.  . nitrofurantoin, macrocrystal-monohydrate, (MACROBID) 100 MG capsule Take 100 mg by mouth daily.   . nitroGLYCERIN (NITROSTAT) 0.4 MG SL tablet Place 1 tablet (0.4 mg total) under the tongue every 5 (five) minutes as needed for chest pain.  . pantoprazole (PROTONIX) 40 MG tablet Take 1  tablet (40 mg total) by mouth daily.  . polyethylene glycol (MIRALAX / GLYCOLAX) packet Take 17 g by mouth daily.  . saxagliptin HCl (ONGLYZA) 2.5 MG TABS tablet Take 2.5 mg by mouth daily.  . sertraline (ZOLOFT) 50 MG tablet Take 50 mg by mouth daily.   Marland Kitchen spironolactone (ALDACTONE) 25 MG tablet Take 1 tablet (25 mg total) by mouth daily.  . traMADol (ULTRAM) 50 MG tablet Take 50 mg by mouth every 6 (six) hours as needed for moderate pain.   Marland Kitchen warfarin (COUMADIN) 2.5 MG tablet TAKE AS DIRECTED BY ANTICOAGULATION CLINIC   No facility-administered encounter medications on file as of 09/05/2015.    Functional Status:  In your present state of health, do you have any difficulty performing the following activities: 09/05/2015 05/02/2015  Hearing? N Y  Vision? Y Y  Difficulty concentrating or making decisions? N N  Walking or climbing stairs? Y Y  Dressing or bathing? Y N  Doing errands, shopping? Tempie Donning  Preparing Food and eating ? Y Y  Using the Toilet? N N  In the past six months, have you accidently leaked urine? Y Y  Do you have problems with loss of bowel control? N N  Managing your Medications? N Y  Managing your Finances? Tempie Donning  Housekeeping or managing your Housekeeping? Tempie Donning    Fall/Depression Screening: PHQ 2/9 Scores 09/05/2015 05/02/2015  PHQ - 2 Score 0 0  Plan: Patient will document daily weights and BPs.           Patient will notify MD for any problematic symptoms.           RN will mail CHF magnet           RN will follow up in approximately one month.

## 2015-09-06 DIAGNOSIS — N390 Urinary tract infection, site not specified: Secondary | ICD-10-CM | POA: Diagnosis not present

## 2015-09-10 DIAGNOSIS — R0602 Shortness of breath: Secondary | ICD-10-CM | POA: Diagnosis not present

## 2015-09-10 DIAGNOSIS — I509 Heart failure, unspecified: Secondary | ICD-10-CM | POA: Diagnosis not present

## 2015-09-12 ENCOUNTER — Emergency Department (HOSPITAL_COMMUNITY)
Admission: EM | Admit: 2015-09-12 | Discharge: 2015-09-12 | Disposition: A | Discharge status: Home / Self Care | Payer: Medicare Other | Attending: Emergency Medicine | Admitting: Emergency Medicine

## 2015-09-12 ENCOUNTER — Emergency Department (HOSPITAL_COMMUNITY): Payer: Medicare Other

## 2015-09-12 ENCOUNTER — Encounter (HOSPITAL_COMMUNITY): Payer: Self-pay

## 2015-09-12 ENCOUNTER — Other Ambulatory Visit: Payer: Self-pay

## 2015-09-12 DIAGNOSIS — S0990XA Unspecified injury of head, initial encounter: Secondary | ICD-10-CM | POA: Insufficient documentation

## 2015-09-12 DIAGNOSIS — S3993XA Unspecified injury of pelvis, initial encounter: Secondary | ICD-10-CM | POA: Diagnosis not present

## 2015-09-12 DIAGNOSIS — M1610 Unilateral primary osteoarthritis, unspecified hip: Secondary | ICD-10-CM | POA: Diagnosis not present

## 2015-09-12 DIAGNOSIS — Y999 Unspecified external cause status: Secondary | ICD-10-CM | POA: Insufficient documentation

## 2015-09-12 DIAGNOSIS — I252 Old myocardial infarction: Secondary | ICD-10-CM | POA: Diagnosis not present

## 2015-09-12 DIAGNOSIS — Y939 Activity, unspecified: Secondary | ICD-10-CM | POA: Insufficient documentation

## 2015-09-12 DIAGNOSIS — W1839XA Other fall on same level, initial encounter: Secondary | ICD-10-CM | POA: Diagnosis not present

## 2015-09-12 DIAGNOSIS — I509 Heart failure, unspecified: Secondary | ICD-10-CM | POA: Diagnosis not present

## 2015-09-12 DIAGNOSIS — M25552 Pain in left hip: Secondary | ICD-10-CM | POA: Diagnosis not present

## 2015-09-12 DIAGNOSIS — I2510 Atherosclerotic heart disease of native coronary artery without angina pectoris: Secondary | ICD-10-CM | POA: Insufficient documentation

## 2015-09-12 DIAGNOSIS — R102 Pelvic and perineal pain: Secondary | ICD-10-CM | POA: Insufficient documentation

## 2015-09-12 DIAGNOSIS — I130 Hypertensive heart and chronic kidney disease with heart failure and stage 1 through stage 4 chronic kidney disease, or unspecified chronic kidney disease: Secondary | ICD-10-CM | POA: Insufficient documentation

## 2015-09-12 DIAGNOSIS — N183 Chronic kidney disease, stage 3 (moderate): Secondary | ICD-10-CM | POA: Diagnosis not present

## 2015-09-12 DIAGNOSIS — M545 Low back pain, unspecified: Secondary | ICD-10-CM

## 2015-09-12 DIAGNOSIS — M810 Age-related osteoporosis without current pathological fracture: Secondary | ICD-10-CM | POA: Diagnosis not present

## 2015-09-12 DIAGNOSIS — Y929 Unspecified place or not applicable: Secondary | ICD-10-CM | POA: Diagnosis not present

## 2015-09-12 DIAGNOSIS — W19XXXA Unspecified fall, initial encounter: Secondary | ICD-10-CM

## 2015-09-12 DIAGNOSIS — M25551 Pain in right hip: Secondary | ICD-10-CM | POA: Diagnosis not present

## 2015-09-12 LAB — COMPREHENSIVE METABOLIC PANEL
ALK PHOS: 61 U/L (ref 38–126)
ALT: 15 U/L (ref 14–54)
ANION GAP: 7 (ref 5–15)
AST: 24 U/L (ref 15–41)
Albumin: 4 g/dL (ref 3.5–5.0)
BILIRUBIN TOTAL: 0.8 mg/dL (ref 0.3–1.2)
BUN: 25 mg/dL — ABNORMAL HIGH (ref 6–20)
CALCIUM: 9.3 mg/dL (ref 8.9–10.3)
CO2: 29 mmol/L (ref 22–32)
Chloride: 102 mmol/L (ref 101–111)
Creatinine, Ser: 0.98 mg/dL (ref 0.44–1.00)
GFR, EST AFRICAN AMERICAN: 58 mL/min — AB (ref >60)
GFR, EST NON AFRICAN AMERICAN: 50 mL/min — AB (ref >60)
Glucose, Bld: 117 mg/dL — ABNORMAL HIGH (ref 65–99)
POTASSIUM: 3.6 mmol/L (ref 3.5–5.1)
SODIUM: 138 mmol/L (ref 135–145)
TOTAL PROTEIN: 7.1 g/dL (ref 6.5–8.1)

## 2015-09-12 LAB — URINALYSIS, ROUTINE W REFLEX MICROSCOPIC
BILIRUBIN URINE: NEGATIVE
GLUCOSE, UA: NEGATIVE mg/dL
KETONES UR: NEGATIVE mg/dL
Leukocytes, UA: NEGATIVE
Nitrite: NEGATIVE
PROTEIN: NEGATIVE mg/dL
Specific Gravity, Urine: <1.005 — ABNORMAL LOW (ref 1.005–1.030)
pH: 6 (ref 5.0–8.0)

## 2015-09-12 LAB — CBC WITH DIFFERENTIAL/PLATELET
BASOS ABS: 0 10*3/uL (ref 0.0–0.1)
Basophils Relative: 0 %
EOS ABS: 0.2 10*3/uL (ref 0.0–0.7)
EOS PCT: 2 %
HCT: 34 % — ABNORMAL LOW (ref 36.0–46.0)
Hemoglobin: 11.2 g/dL — ABNORMAL LOW (ref 12.0–15.0)
Lymphocytes Relative: 21 %
Lymphs Abs: 1.5 10*3/uL (ref 0.7–4.0)
MCH: 30.9 pg (ref 26.0–34.0)
MCHC: 32.9 g/dL (ref 30.0–36.0)
MCV: 93.7 fL (ref 78.0–100.0)
MONO ABS: 0.4 10*3/uL (ref 0.1–1.0)
Monocytes Relative: 5 %
Neutro Abs: 5.1 10*3/uL (ref 1.7–7.7)
Neutrophils Relative %: 72 %
PLATELETS: 149 10*3/uL — AB (ref 150–400)
RBC: 3.63 MIL/uL — AB (ref 3.87–5.11)
RDW: 13.5 % (ref 11.5–15.5)
WBC: 7.2 10*3/uL (ref 4.0–10.5)

## 2015-09-12 LAB — PROTIME-INR
INR: 3.15 — AB (ref 0.00–1.49)
PROTHROMBIN TIME: 31.8 s — AB (ref 11.6–15.2)

## 2015-09-12 LAB — URINE MICROSCOPIC-ADD ON
Bacteria, UA: NONE SEEN
SQUAMOUS EPITHELIAL / LPF: NONE SEEN
WBC UA: NONE SEEN WBC/hpf (ref 0–5)

## 2015-09-12 MED ORDER — TRAMADOL HCL 50 MG PO TABS: 50.0000 mg | ORAL_TABLET | Freq: Four times a day (QID) | ORAL | Status: AC | PRN

## 2015-09-12 NOTE — Discharge Instructions (Signed)

## 2015-09-12 NOTE — ED Notes (Signed)
Pt reports that she was looking in mirror with walker in front of her and lost balance and fell backwards. Denies loss of conscious. Pain in hips and lower back. Currently on blood thinners. Started vomiting when she got out of car

## 2015-09-12 NOTE — ED Provider Notes (Signed)
CSN: [id number]     Arrival date & time 09/12/15  1012 History  By signing my name below, I, Hansel Feinstein, attest that this documentation has been prepared under the direction and in the presence of Julianne Rice, MD. Electronically Signed: Hansel Feinstein, ED Scribe. 09/12/2015. 11:53 AM.     Chief Complaint  Patient presents with  . Fall   The history is provided by the patient. No language interpreter was used.    HPI Comments: [name] is a 82 y.o. female who presents to the Emergency Department complaining of mild pelvic pain s/p mechanical fall that occurred at 9am. Pt states that she was standing, lost her balance and fell backward, landing on her buttocks. Patient is unsure whether she hit her head. She denies LOC. Pt remembers the fall in its entirety. She was unable to get up from the floor. Pt has not attempted to ambulate since falling. Pt is currently taking qd Coumadin for Afib. Pt notes she is currently on day 4 of a course of antibiotics for UTI. She denies neck pain, abrasions, fever, HA, dizziness, additional injuries.   Past Medical History  Diagnosis Date  . Pure hypercholesterolemia   . Arthritis     severe lower back pain  . Other specified forms of chronic ischemic heart disease     echo (1/10) with EF 25-30%, mildly dilated LV, periapical LV aneueysm, calcified LV thombus, moderate MR. Medtronic ICD set to VVI.   Marland Kitchen CAD (coronary artery disease)     anterior MI in 2006 with Cypher DES to LAD  . HTN (hypertension)   . LV (left ventricular) mural thrombus (HCC)     on coumadin   . Nephrolithiasis   . CHF (congestive heart failure) ([place])   . Cardiomyopathy   . OA (osteoarthritis) of hip   . Osteoporosis   . On home oxygen therapy     at night  . MI, old   . CKD (chronic kidney disease) stage 3, GFR 30-59 ml/min     Stage III-stage IV   Past Surgical History  Procedure Laterality Date  . Aicd implantation      ICD-Medtronic. Remote-yes   . Coronary  angioplasty with stent placement    . Coronary angioplasty    . Cardiac catheterization    . Vesicovaginal fistula closure w/ tah  1978   Family History  Problem Relation Age of Onset  . Breast cancer Mother   . Stroke Father   . Stroke Brother   . Diabetes Brother    Social History  Substance Use Topics  . Smoking status: Never Smoker   . Smokeless tobacco: Never Used  . Alcohol Use: No   OB History    No data available     Review of Systems  Constitutional: Negative for fever, chills and fatigue.  Respiratory: Negative for shortness of breath.   Cardiovascular: Negative for chest pain.  Gastrointestinal: Negative for nausea, vomiting, abdominal pain, diarrhea and constipation.  Genitourinary: Positive for pelvic pain. Negative for dysuria, frequency, hematuria and flank pain.  Musculoskeletal: Positive for back pain and arthralgias. Negative for neck pain.  Skin: Negative for rash and wound.  Neurological: Negative for dizziness, syncope, weakness, light-headedness, numbness and headaches.  All other systems reviewed and are negative.  Allergies  Codeine  Home Medications   Prior to Admission medications   Medication Sig Start Date End Date Taking? Authorizing Provider  acetaminophen (TYLENOL) 325 MG tablet Take 325-650 mg by mouth every  6 (six) hours as needed for mild pain.     Historical Provider, MD  atorvastatin (LIPITOR) 40 MG tablet Take 1 tablet (40 mg total) by mouth daily at 6 PM. 08/29/15   Larey Dresser, MD  carvedilol (COREG) 25 MG tablet TAKE 1 TABLET (25 MG TOTAL) BY MOUTH 2 (TWO) TIMES DAILY. 05/11/15   Larey Dresser, MD  cholecalciferol (VITAMIN D) 1000 UNITS tablet Take 1,000 Units by mouth daily.      Historical Provider, MD  folic acid (FOLVITE) 1 MG tablet Take 1 mg by mouth daily.    Historical Provider, MD  furosemide (LASIX) 40 MG tablet Take 20-40 mg by mouth 2 (two) times daily. Take 40 mg (1 tablet) in the morning and 20 mg (1/2 tablet) in  the afternoon    Historical Provider, MD  losartan (COZAAR) 50 MG tablet Take 1 tablet (50 mg total) by mouth daily. 08/08/15   Shirley Friar, PA-C  meclizine (ANTIVERT) 25 MG tablet Take 25 mg by mouth 3 (three) times daily as needed for dizziness.    Historical Provider, MD  nitrofurantoin, macrocrystal-monohydrate, (MACROBID) 100 MG capsule Take 100 mg by mouth daily.     Historical Provider, MD  nitroGLYCERIN (NITROSTAT) 0.4 MG SL tablet Place 1 tablet (0.4 mg total) under the tongue every 5 (five) minutes as needed for chest pain. 02/12/15   Evans Lance, MD  pantoprazole (PROTONIX) 40 MG tablet Take 1 tablet (40 mg total) by mouth daily. 06/11/15   Larey Dresser, MD  polyethylene glycol [facility] / Floria Raveling) packet Take 17 g by mouth daily. 04/28/15   Domenic Polite, MD  saxagliptin HCl (ONGLYZA) 2.5 MG TABS tablet Take 2.5 mg by mouth daily.    Historical Provider, MD  sertraline (ZOLOFT) 50 MG tablet Take 50 mg by mouth daily.     Historical Provider, MD  spironolactone (ALDACTONE) 25 MG tablet Take 1 tablet (25 mg total) by mouth daily. 07/16/15   Larey Dresser, MD  traMADol (ULTRAM) 50 MG tablet Take 1 tablet (50 mg total) by mouth every 6 (six) hours as needed for moderate pain. 09/12/15   Julianne Rice, MD  warfarin (COUMADIN) 2.5 MG tablet TAKE AS DIRECTED BY ANTICOAGULATION CLINIC 06/08/15   Evans Lance, MD   BP 158/62 mmHg  Pulse 64  Temp(Src) 98 F (36.7 C) (Oral)  Resp 19  Ht 4\' 11"  (1.499 m)  Wt 140 lb (63.504 kg)  BMI 28.26 kg/m2  SpO2 94% Physical Exam  Constitutional: She is oriented to person, place, and time. She appears well-developed and well-nourished. No distress.  HENT:  Head: Normocephalic and atraumatic.  Mouth/Throat: Oropharynx is clear and moist. No oropharyngeal exudate.  No obvious scalp contusion or signs of head injury.  Eyes: EOM are normal. Pupils are equal, round, and reactive to light.  Neck: Normal range of motion. Neck supple.  No  posterior midline cervical tenderness to palpation.  Cardiovascular: Normal rate and regular rhythm.  Exam reveals no gallop and no friction rub.   No murmur heard. Pulmonary/Chest: Effort normal and breath sounds normal. No respiratory distress. She has no wheezes. She has no rales.  Abdominal: Soft. Bowel sounds are normal. She exhibits no distension and no mass. There is no tenderness. There is no rebound and no guarding.  Musculoskeletal: Normal range of motion. She exhibits tenderness. She exhibits no edema.  Patient has mild iliac crest tenderness bilaterally. There is no obvious contusion. No midline thoracic or lumbar  tenderness. Pelvis is stable. Full range of motion of bilateral lower extremities without pain. Distal pulses are intact.  Neurological: She is alert and oriented to person, place, and time.  5/5 motor in all extremities. Sensation is fully intact  Skin: Skin is warm and dry. No rash noted. No erythema.  Psychiatric: She has a normal mood and affect. Her behavior is normal.  Nursing note and vitals reviewed.   ED Course  Procedures (including critical care time) DIAGNOSTIC STUDIES: Oxygen Saturation is 93% on RA, adequate by my interpretation.    COORDINATION OF CARE: 11:51 AM Discussed treatment plan with pt at bedside which includes XR and pt agreed to plan.   Labs Review Labs Reviewed  URINALYSIS, ROUTINE W REFLEX MICROSCOPIC (NOT AT [facility]) - Abnormal; Notable for the following:    Specific Gravity, Urine <1.005 (*)    Hgb urine dipstick TRACE (*)    All other components within normal limits  CBC WITH DIFFERENTIAL/PLATELET - Abnormal; Notable for the following:    RBC 3.63 (*)    Hemoglobin 11.2 (*)    HCT 34.0 (*)    Platelets 149 (*)    All other components within normal limits  COMPREHENSIVE METABOLIC PANEL - Abnormal; Notable for the following:    Glucose, Bld 117 (*)    BUN 25 (*)    GFR calc non Af Amer 50 (*)    GFR calc Af Amer 58 (*)    All  other components within normal limits  PROTIME-INR - Abnormal; Notable for the following:    Prothrombin Time 31.8 (*)    INR 3.15 (*)    All other components within normal limits  URINE MICROSCOPIC-ADD ON    Imaging Review Dg Pelvis 1-2 Views  09/12/2015  CLINICAL DATA:  Fall at home this morning.  Pain in both hips. EXAM: PELVIS - 1-2 VIEW COMPARISON:  CT 09/11/2010 FINDINGS: Mild symmetric degenerative changes in the hips. SI joints are symmetric and unremarkable. Diffuse osteopenia. No acute bony abnormality. Specifically, no fracture, subluxation, or dislocation. Soft tissues are intact. IMPRESSION: No acute bony abnormality. Electronically Signed   By: Rolm Baptise M.D.   On: 09/12/2015 12:30   Ct Head Wo Contrast  09/12/2015  CLINICAL DATA:  Fall. Anti coagulation. Vomiting. Hip and back pain. Struck head. EXAM: CT HEAD WITHOUT CONTRAST TECHNIQUE: Contiguous axial images were obtained from the base of the skull through the vertex without intravenous contrast. COMPARISON:  09/05/2014 FINDINGS: The brainstem, cerebellum, cerebral peduncles, thalami, basal ganglia, basilar cisterns, and ventricular system appear within normal limits. Periventricular white matter and corona radiata hypodensities favor chronic ischemic microvascular white matter disease. No intracranial hemorrhage, mass lesion, or acute CVA. Mucosal thickening in the nasal cavity. Chronic ethmoid sinusitis. Failure of fusion of the anterior arch of C1 with extensive calcification of the transverse ligament of C1. There is atherosclerotic calcification of the cavernous carotid arteries bilaterally. IMPRESSION: 1. No acute intracranial findings. 2. Periventricular white matter and corona radiata hypodensities favor chronic ischemic microvascular white matter disease. 3. Chronic ethmoid sinusitis. 4. Failure of fusion of the anterior arch of C1 with calcified transverse ligament of C1 and irregular spurring at the craniocervical  junction. Electronically Signed   By: Van Clines M.D.   On: 09/12/2015 11:03   I have personally reviewed and evaluated these images and lab results as part of my medical decision-making.   EKG Interpretation   Date/Time:  Wednesday September 12 2015 12:05:49 EDT Ventricular Rate:  63 PR  Interval:    QRS Duration: 123 QT Interval:  414 QTC Calculation: 424 R Axis:   -117 Text Interpretation:  Sinusrhythm Atrial premature complexes in couplets  Nonspecific IVCD with LAD Abnormal lateral Q waves, present on June 2016  Anterior infarct, old Confirmed by Hazle Coca [phone]) on 09/13/2015 4:01:43  PM      MDM   Final diagnoses:  Fall, initial encounter  Bilateral low back pain without sciatica    I personally performed the services described in this documentation, which was scribed in my presence. The recorded information has been reviewed and is accurate.  Normal neurologic exam. CT head without acute findings. Return precautions given.  Julianne Rice, MD 09/14/15 [phone]

## 2015-09-21 ENCOUNTER — Ambulatory Visit (INDEPENDENT_AMBULATORY_CARE_PROVIDER_SITE_OTHER): Payer: Medicare Other | Admitting: Pharmacist

## 2015-09-21 DIAGNOSIS — I253 Aneurysm of heart: Secondary | ICD-10-CM

## 2015-09-21 DIAGNOSIS — Z5181 Encounter for therapeutic drug level monitoring: Secondary | ICD-10-CM | POA: Diagnosis not present

## 2015-09-21 DIAGNOSIS — I5020 Unspecified systolic (congestive) heart failure: Secondary | ICD-10-CM | POA: Diagnosis not present

## 2015-09-21 DIAGNOSIS — E119 Type 2 diabetes mellitus without complications: Secondary | ICD-10-CM | POA: Diagnosis not present

## 2015-09-21 DIAGNOSIS — Z7901 Long term (current) use of anticoagulants: Secondary | ICD-10-CM | POA: Diagnosis not present

## 2015-09-21 DIAGNOSIS — I513 Intracardiac thrombosis, not elsewhere classified: Secondary | ICD-10-CM

## 2015-09-21 DIAGNOSIS — I213 ST elevation (STEMI) myocardial infarction of unspecified site: Secondary | ICD-10-CM

## 2015-09-21 DIAGNOSIS — E7800 Pure hypercholesterolemia, unspecified: Secondary | ICD-10-CM | POA: Diagnosis not present

## 2015-09-21 LAB — POCT INR: INR: 3

## 2015-10-04 ENCOUNTER — Other Ambulatory Visit: Payer: Self-pay

## 2015-10-04 VITALS — Wt 142.0 lb

## 2015-10-04 DIAGNOSIS — I509 Heart failure, unspecified: Secondary | ICD-10-CM

## 2015-10-04 NOTE — Patient Outreach (Signed)
[facility]) Care Management  10/04/2015  [name] June 25, 1925 [id number]   Monthly telephonic contact today.  Patient reports using her CHF Action Plan, and that her weights and BPs have been stable.  She does limit salt in her diet, but states she likes an occasional Chick Filet sandwich.  Education provided re watching salt content when eating out.  Patient reports that on June 21st she fell in her home and was taken to the ED by family members.  She suffered no serious injuries- just soreness and pain in her back. Education provided on home safety and fall prevention strategies.  RN will follow up in the month of August.  Candie Mile, RN, MSN [place] [phone] Fax [phone]

## 2015-10-09 DIAGNOSIS — R110 Nausea: Secondary | ICD-10-CM | POA: Diagnosis not present

## 2015-10-09 DIAGNOSIS — E119 Type 2 diabetes mellitus without complications: Secondary | ICD-10-CM | POA: Diagnosis not present

## 2015-10-09 DIAGNOSIS — I10 Essential (primary) hypertension: Secondary | ICD-10-CM | POA: Diagnosis not present

## 2015-10-09 DIAGNOSIS — D649 Anemia, unspecified: Secondary | ICD-10-CM | POA: Diagnosis not present

## 2015-10-09 DIAGNOSIS — E7800 Pure hypercholesterolemia, unspecified: Secondary | ICD-10-CM | POA: Diagnosis not present

## 2015-10-09 DIAGNOSIS — I5020 Unspecified systolic (congestive) heart failure: Secondary | ICD-10-CM | POA: Diagnosis not present

## 2015-10-09 DIAGNOSIS — N390 Urinary tract infection, site not specified: Secondary | ICD-10-CM | POA: Diagnosis not present

## 2015-10-10 DIAGNOSIS — I509 Heart failure, unspecified: Secondary | ICD-10-CM | POA: Diagnosis not present

## 2015-10-10 DIAGNOSIS — R0602 Shortness of breath: Secondary | ICD-10-CM | POA: Diagnosis not present

## 2015-10-15 DIAGNOSIS — Z7901 Long term (current) use of anticoagulants: Secondary | ICD-10-CM | POA: Diagnosis not present

## 2015-11-02 ENCOUNTER — Ambulatory Visit (INDEPENDENT_AMBULATORY_CARE_PROVIDER_SITE_OTHER): Payer: Medicare Other | Admitting: *Deleted

## 2015-11-02 DIAGNOSIS — Z7901 Long term (current) use of anticoagulants: Secondary | ICD-10-CM | POA: Diagnosis not present

## 2015-11-02 DIAGNOSIS — I253 Aneurysm of heart: Secondary | ICD-10-CM

## 2015-11-02 DIAGNOSIS — I513 Intracardiac thrombosis, not elsewhere classified: Secondary | ICD-10-CM

## 2015-11-02 DIAGNOSIS — I213 ST elevation (STEMI) myocardial infarction of unspecified site: Secondary | ICD-10-CM

## 2015-11-02 DIAGNOSIS — Z5181 Encounter for therapeutic drug level monitoring: Secondary | ICD-10-CM

## 2015-11-02 LAB — POCT INR: INR: 1.8

## 2015-11-07 ENCOUNTER — Other Ambulatory Visit: Payer: Self-pay

## 2015-11-07 NOTE — Patient Outreach (Signed)
[facility]) Care Management  11/07/2015  Deeandra Heling Dishon [birth year] [id number]  Unsuccessful attempt to reach patient.  HIPPA appropriate message left requesting call back. If no response RN will make another attempt within 10 working days.  Candie Mile, RN, MSN [place] [phone] Fax [phone]

## 2015-11-10 DIAGNOSIS — R0602 Shortness of breath: Secondary | ICD-10-CM | POA: Diagnosis not present

## 2015-11-10 DIAGNOSIS — I509 Heart failure, unspecified: Secondary | ICD-10-CM | POA: Diagnosis not present

## 2015-11-12 ENCOUNTER — Emergency Department (HOSPITAL_COMMUNITY)
Admission: EM | Admit: 2015-11-12 | Discharge: 2015-11-13 | Disposition: A | Discharge status: Home / Self Care | Payer: Medicare Other | Attending: Emergency Medicine | Admitting: Emergency Medicine

## 2015-11-12 ENCOUNTER — Telehealth: Payer: Self-pay | Admitting: *Deleted

## 2015-11-12 ENCOUNTER — Encounter (HOSPITAL_COMMUNITY): Payer: Self-pay | Admitting: Emergency Medicine

## 2015-11-12 DIAGNOSIS — I5022 Chronic systolic (congestive) heart failure: Secondary | ICD-10-CM | POA: Insufficient documentation

## 2015-11-12 DIAGNOSIS — I130 Hypertensive heart and chronic kidney disease with heart failure and stage 1 through stage 4 chronic kidney disease, or unspecified chronic kidney disease: Secondary | ICD-10-CM | POA: Insufficient documentation

## 2015-11-12 DIAGNOSIS — Z7901 Long term (current) use of anticoagulants: Secondary | ICD-10-CM | POA: Insufficient documentation

## 2015-11-12 DIAGNOSIS — Z79899 Other long term (current) drug therapy: Secondary | ICD-10-CM | POA: Insufficient documentation

## 2015-11-12 DIAGNOSIS — I2510 Atherosclerotic heart disease of native coronary artery without angina pectoris: Secondary | ICD-10-CM | POA: Insufficient documentation

## 2015-11-12 DIAGNOSIS — N183 Chronic kidney disease, stage 3 (moderate): Secondary | ICD-10-CM | POA: Insufficient documentation

## 2015-11-12 DIAGNOSIS — N390 Urinary tract infection, site not specified: Secondary | ICD-10-CM

## 2015-11-12 DIAGNOSIS — R1010 Upper abdominal pain, unspecified: Secondary | ICD-10-CM | POA: Diagnosis not present

## 2015-11-12 DIAGNOSIS — K5790 Diverticulosis of intestine, part unspecified, without perforation or abscess without bleeding: Secondary | ICD-10-CM | POA: Diagnosis not present

## 2015-11-12 LAB — COMPREHENSIVE METABOLIC PANEL
ALK PHOS: 64 U/L (ref 38–126)
ALT: 16 U/L (ref 14–54)
ANION GAP: 12 (ref 5–15)
AST: 23 U/L (ref 15–41)
Albumin: 4.1 g/dL (ref 3.5–5.0)
BUN: 35 mg/dL — ABNORMAL HIGH (ref 6–20)
CALCIUM: 9.1 mg/dL (ref 8.9–10.3)
CO2: 28 mmol/L (ref 22–32)
CREATININE: 1.2 mg/dL — AB (ref 0.44–1.00)
Chloride: 101 mmol/L (ref 101–111)
GFR, EST AFRICAN AMERICAN: 45 mL/min — AB (ref >60)
GFR, EST NON AFRICAN AMERICAN: 39 mL/min — AB (ref >60)
Glucose, Bld: 105 mg/dL — ABNORMAL HIGH (ref 65–99)
Potassium: 3.6 mmol/L (ref 3.5–5.1)
SODIUM: 141 mmol/L (ref 135–145)
TOTAL PROTEIN: 7.3 g/dL (ref 6.5–8.1)
Total Bilirubin: 0.6 mg/dL (ref 0.3–1.2)

## 2015-11-12 LAB — CBC
HCT: 36.3 % (ref 36.0–46.0)
HEMOGLOBIN: 11.6 g/dL — AB (ref 12.0–15.0)
MCH: 30.2 pg (ref 26.0–34.0)
MCHC: 32 g/dL (ref 30.0–36.0)
MCV: 94.5 fL (ref 78.0–100.0)
PLATELETS: 171 10*3/uL (ref 150–400)
RBC: 3.84 MIL/uL — AB (ref 3.87–5.11)
RDW: 13.5 % (ref 11.5–15.5)
WBC: 6.5 10*3/uL (ref 4.0–10.5)

## 2015-11-12 LAB — LIPASE, BLOOD: Lipase: 31 U/L (ref 11–51)

## 2015-11-12 NOTE — Telephone Encounter (Signed)
Pharmacist from [facility] called for clarification on the warfarin rx. Please call [phone]. Thanks, MI

## 2015-11-12 NOTE — Telephone Encounter (Signed)
Returned call to clarify rx - pt recently transferred prescriptions from [facility] to [place].

## 2015-11-12 NOTE — ED Triage Notes (Signed)
Patient complaining of "a mass" on her right side. Swelling noted to right side at triage.

## 2015-11-13 ENCOUNTER — Ambulatory Visit (HOSPITAL_COMMUNITY): Payer: Self-pay

## 2015-11-13 ENCOUNTER — Emergency Department (HOSPITAL_COMMUNITY): Payer: Medicare Other

## 2015-11-13 DIAGNOSIS — K5790 Diverticulosis of intestine, part unspecified, without perforation or abscess without bleeding: Secondary | ICD-10-CM | POA: Diagnosis not present

## 2015-11-13 LAB — URINALYSIS, ROUTINE W REFLEX MICROSCOPIC
BILIRUBIN URINE: NEGATIVE
Glucose, UA: NEGATIVE mg/dL
HGB URINE DIPSTICK: NEGATIVE
Ketones, ur: NEGATIVE mg/dL
Nitrite: POSITIVE — AB
PROTEIN: NEGATIVE mg/dL
Specific Gravity, Urine: <1.005 — ABNORMAL LOW (ref 1.005–1.030)
pH: 6 (ref 5.0–8.0)

## 2015-11-13 LAB — URINE MICROSCOPIC-ADD ON

## 2015-11-13 MED ORDER — CEPHALEXIN 500 MG PO CAPS: 500.0000 mg | ORAL_CAPSULE | Freq: Four times a day (QID) | ORAL | 0 refills | Status: DC

## 2015-11-13 MED ORDER — CEPHALEXIN 500 MG PO CAPS
500.0000 mg | ORAL_CAPSULE | Freq: Once | ORAL | Status: AC
  Administered 2015-11-13: 500 mg via ORAL
  Filled 2015-11-13: qty 1

## 2015-11-13 NOTE — ED Notes (Signed)
Abdominal binder in place.  Patient states that it did relieve some pain.

## 2015-11-13 NOTE — ED Notes (Signed)
Family informed that it will be 20-30 minutes before they take the patient to CT.

## 2015-11-13 NOTE — ED Provider Notes (Signed)
[facility] DEPT Provider Note   CSN: [id number] Arrival date & time: 11/12/15  1756     History   Chief Complaint Chief Complaint  Patient presents with  . Abdominal Pain    HPI [name] is a 82 y.o. female.  The history is provided by the patient. No language interpreter was used.  Abdominal Pain   This is a new problem. The current episode started yesterday. The problem occurs constantly. The problem has been gradually worsening. The pain is associated with an unknown factor. The pain is located in the RUQ. The pain is moderate. Nothing aggravates the symptoms. Nothing relieves the symptoms. Past workup does not include GI consult. Her past medical history does not include gallstones.  Pt complains of a swollen area to right side.  Pt has been complaining of pain and family reports swelling to patients right side. Pt has recently had a kidney infection.  Pt reports her doctor told her she has bad kidneys  Past Medical History:  Diagnosis Date  . Arthritis    severe lower back pain  . CAD (coronary artery disease)    anterior MI in 2006 with Cypher DES to LAD  . Cardiomyopathy   . CHF (congestive heart failure) ([place])   . CKD (chronic kidney disease) stage 3, GFR 30-59 ml/min    Stage III-stage IV  . HTN (hypertension)   . LV (left ventricular) mural thrombus (HCC)    on coumadin   . MI, old   . Nephrolithiasis   . OA (osteoarthritis) of hip   . On home oxygen therapy    at night  . Osteoporosis   . Other specified forms of chronic ischemic heart disease    echo (1/10) with EF 25-30%, mildly dilated LV, periapical LV aneueysm, calcified LV thombus, moderate MR. Medtronic ICD set to VVI.   Marland Kitchen Pure hypercholesterolemia     Patient Active Problem List   Diagnosis Date Noted  . CKD (chronic kidney disease) stage 3, GFR 30-59 ml/min 04/26/2015  . Urinary tract infection 04/25/2015  . Cardiomyopathy, ischemic 04/25/2015  . Fecal impaction ([place]) 04/25/2015  .  Degenerative joint disease 04/25/2015  . Chest pain, precordial 08/28/2014  . Encounter for therapeutic drug monitoring 05/02/2013  . CKD (chronic kidney disease) 10/22/2011  . Angina at rest [facility]) 10/08/2011  . UTI (lower urinary tract infection) 10/08/2011  . Ventricular tachycardia ([place]) 06/10/2011  . Systolic CHF, chronic ([place]) 12/11/2010  . LV (left ventricular) mural thrombus ([place]) 12/11/2010  . Long term (current) use of anticoagulants 06/25/2010  . ICD-Medtronic 05/01/2009  . HYPERCHOLESTEROLEMIA 06/19/2008  . Hyperlipidemia 06/19/2008  . HTN (hypertension) 06/19/2008  . Coronary artery disease due to calcified coronary lesion 06/19/2008  . Aneurysm of heart wall 06/19/2008  . UNSPECIFIED SYSTOLIC HEART FAILURE 99991111  . ARTHRITIS 06/19/2008  . SHORTNESS OF BREATH 06/19/2008    Past Surgical History:  Procedure Laterality Date  . AICD implantation     ICD-Medtronic. Remote-yes   . CARDIAC CATHETERIZATION    . CORONARY ANGIOPLASTY    . CORONARY ANGIOPLASTY WITH STENT PLACEMENT    . VESICOVAGINAL FISTULA CLOSURE W/ TAH  1978    OB History    No data available       Home Medications    Prior to Admission medications   Medication Sig Start Date End Date Taking? Authorizing Provider  acetaminophen (TYLENOL) 325 MG tablet Take 325-650 mg by mouth every 6 (six) hours as needed for mild pain.  Historical Provider, MD  atorvastatin (LIPITOR) 40 MG tablet Take 1 tablet (40 mg total) by mouth daily at 6 PM. 08/29/15   Larey Dresser, MD  carvedilol (COREG) 25 MG tablet TAKE 1 TABLET (25 MG TOTAL) BY MOUTH 2 (TWO) TIMES DAILY. 05/11/15   Larey Dresser, MD  cholecalciferol (VITAMIN D) 1000 UNITS tablet Take 1,000 Units by mouth daily.      Historical Provider, MD  folic acid (FOLVITE) 1 MG tablet Take 1 mg by mouth daily.    Historical Provider, MD  furosemide (LASIX) 40 MG tablet Take 20-40 mg by mouth 2 (two) times daily. Take 40 mg (1 tablet) in the morning and 20  mg (1/2 tablet) in the afternoon    Historical Provider, MD  losartan (COZAAR) 50 MG tablet Take 1 tablet (50 mg total) by mouth daily. 08/08/15   Shirley Friar, PA-C  meclizine (ANTIVERT) 25 MG tablet Take 25 mg by mouth 3 (three) times daily as needed for dizziness.    Historical Provider, MD  nitrofurantoin, macrocrystal-monohydrate, (MACROBID) 100 MG capsule Take 100 mg by mouth daily.     Historical Provider, MD  nitroGLYCERIN (NITROSTAT) 0.4 MG SL tablet Place 1 tablet (0.4 mg total) under the tongue every 5 (five) minutes as needed for chest pain. 02/12/15   Evans Lance, MD  pantoprazole (PROTONIX) 40 MG tablet Take 1 tablet (40 mg total) by mouth daily. 06/11/15   Larey Dresser, MD  polyethylene glycol [facility] / Floria Raveling) packet Take 17 g by mouth daily. 04/28/15   Domenic Polite, MD  saxagliptin HCl (ONGLYZA) 2.5 MG TABS tablet Take 2.5 mg by mouth daily.    Historical Provider, MD  sertraline (ZOLOFT) 50 MG tablet Take 50 mg by mouth daily.     Historical Provider, MD  spironolactone (ALDACTONE) 25 MG tablet Take 1 tablet (25 mg total) by mouth daily. 07/16/15   Larey Dresser, MD  traMADol (ULTRAM) 50 MG tablet Take 1 tablet (50 mg total) by mouth every 6 (six) hours as needed for moderate pain. 09/12/15   Julianne Rice, MD  warfarin (COUMADIN) 2.5 MG tablet TAKE AS DIRECTED BY ANTICOAGULATION CLINIC 06/08/15   Evans Lance, MD    Family History Family History  Problem Relation Age of Onset  . Stroke Father   . Breast cancer Mother   . Stroke Brother   . Diabetes Brother     Social History Social History  Substance Use Topics  . Smoking status: Never Smoker  . Smokeless tobacco: Never Used  . Alcohol use No     Allergies   Codeine   Review of Systems Review of Systems  Gastrointestinal: Positive for abdominal pain.  All other systems reviewed and are negative.    Physical Exam Updated Vital Signs BP 144/69 (BP Location: Right Arm)   Pulse 64    Temp 98.3 F (36.8 C) (Oral)   Resp 20   Ht 4\' 11"  (1.499 m)   Wt 64.4 kg   SpO2 95%   BMI 28.68 kg/m   Physical Exam  Constitutional: She appears well-developed and well-nourished. No distress.  HENT:  Head: Normocephalic and atraumatic.  Eyes: Conjunctivae are normal.  Neck: Neck supple.  Cardiovascular: Normal rate and regular rhythm.   No murmur heard. Pulmonary/Chest: Effort normal and breath sounds normal. No respiratory distress.  Abdominal: Soft. She exhibits mass. There is tenderness.  Musculoskeletal: She exhibits no edema.  Neurological: She is alert.  Skin: Skin is warm  and dry.  Psychiatric: She has a normal mood and affect.  Nursing note and vitals reviewed.    ED Treatments / Results  Labs (all labs ordered are listed, but only abnormal results are displayed) Labs Reviewed  COMPREHENSIVE METABOLIC PANEL - Abnormal; Notable for the following:       Result Value   Glucose, Bld 105 (*)    BUN 35 (*)    Creatinine, Ser 1.20 (*)    GFR calc non Af Amer 39 (*)    GFR calc Af Amer 45 (*)    All other components within normal limits  CBC - Abnormal; Notable for the following:    RBC 3.84 (*)    Hemoglobin 11.6 (*)    All other components within normal limits  URINALYSIS, ROUTINE W REFLEX MICROSCOPIC (NOT AT [facility]) - Abnormal; Notable for the following:    Color, Urine AMBER (*)    Specific Gravity, Urine <1.005 (*)    Nitrite POSITIVE (*)    Leukocytes, UA MODERATE (*)    All other components within normal limits  URINE MICROSCOPIC-ADD ON - Abnormal; Notable for the following:    Squamous Epithelial / LPF 0-5 (*)    Bacteria, UA MANY (*)    All other components within normal limits  LIPASE, BLOOD    EKG  EKG Interpretation None       Radiology Ct Abdomen Pelvis Wo Contrast  Result Date: 11/13/2015 CLINICAL DATA:  82 y/o  F; mass on right side. EXAM: CT ABDOMEN AND PELVIS WITHOUT CONTRAST TECHNIQUE: Multidetector CT imaging of the abdomen and  pelvis was performed following the standard protocol without IV contrast. COMPARISON:  CT urogram dated 09/11/2010. FINDINGS: Lower chest: Moderate cardiomegaly. Calcified septal and apical infarct of left ventricle, stable. Severe coronary artery calcifications. Moderate aortic valvular calcifications. Partially visualized pacemaking wires. Bibasilar atelectasis. Hepatobiliary: No mass visualized on this un-enhanced exam. Pancreas: No mass or inflammatory process identified on this un-enhanced exam. Spleen: Within normal limits in size. Adrenals/Urinary Tract: Stable bilateral adrenal adenomas measuring 32 x 21 mm on the right and 31 x 25 mm on the left axially. Multiple bilateral renal cysts the largest in the left interpolar region measuring 25 mm. Right kidney lower pole nonobstructing stones. No hydronephrosis. Stomach/Bowel: No evidence of obstruction, inflammatory process, or abnormal fluid collections. Moderate sigmoid diverticulosis without evidence of diverticulitis. Small hiatal hernia. Broad-based right-sided eventration of the abdominal wall containing the fundus of gallbladder and ascending colon without obstruction or inflammatory changes. Vascular/Lymphatic: No pathologically enlarged lymph nodes. No evidence of abdominal aortic aneurysm. Severe calcific atherosclerosis of the abdominal aorta and bilateral iliofemoral arteries. Reproductive: No mass or other significant abnormality. Other: None. Musculoskeletal: Moderate dextrocurvature of the lumbar spine with severe multilevel degenerative changes including grade 1 L5-S1 anterolisthesis, minimal L3-4 retrolisthesis, and interbody partial fusion of L1 through L3. No significant interval change from prior CT. IMPRESSION: 1. Broad-based right-sided eventration of the abdominal wall containing the fundus of gallbladder and ascending colon without obstruction or inflammatory changes. 2. No acute abdominal or pelvic abnormality is identified. 3. Stable  bilateral adrenal adenomas. 4. Stable right kidney lower pole nonobstructing stones. 5. Sigmoid diverticulosis without evidence of diverticulitis. 6. Small hilar hernia. Electronically Signed   By: Kristine Garbe M.D.   On: 11/13/2015 01:18    Procedures Procedures (including critical care time)  Medications Ordered in ED Medications - No data to display   Initial Impression / Assessment and Plan / ED Course  I have  reviewed the triage vital signs and the nursing notes.  Pertinent labs & imaging results that were available during my care of the patient were reviewed by me and considered in my medical decision making (see chart for details).  Clinical Course   Pt counseled on abdominal wall weakness/ pain.  Pt advised of uti.  Rx for keflex.   Dr. Tomi Bamberger in to see and examine   Final Clinical Impressions(s) / ED Diagnoses   Final diagnoses:  Pain of upper abdomen  UTI (lower urinary tract infection)    New Prescriptions New Prescriptions   CEPHALEXIN (KEFLEX) 500 MG CAPSULE    Take 1 capsule (500 mg total) by mouth 4 (four) times daily.  An After Visit Summary was printed and given to the patient.   [place], PA-C 11/13/15 [place], MD 11/13/15 [phone]

## 2015-11-13 NOTE — ED Provider Notes (Signed)
Patient presents with acute onset of swelling in her right flank area that she noticed yesterday. There is only one small area that's painful to palpation.  Patient is a frail elderly female. She is noted to have some definite swelling in her right flank area. Her abdomen is soft with some mild tenderness in her lateral right abdomen.   I reviewed the CT scan with the PA and we saw the eventration noted by the radiologist.   Medical screening examination/treatment/procedure(s) were conducted as a shared visit with non-physician practitioner(s) and myself.  I personally evaluated the patient during the encounter.     Rolland Porter, MD, Barbette Or, MD 11/13/15 [phone]

## 2015-11-15 ENCOUNTER — Other Ambulatory Visit: Payer: Self-pay

## 2015-11-15 NOTE — Patient Outreach (Signed)
[facility]) Care Management  11/15/2015  VEIDA RINGUETTE 1925/06/23 [id number]  Second unsuccessful attempt to reach patient.  HIPAA appropriate message left requesting call back. If no response, RN will make another attempt within one week.  Candie Mile, RN, MSN [place] [phone] Fax [phone]

## 2015-11-16 LAB — URINE CULTURE: Culture: >=100000 — AB

## 2015-11-17 ENCOUNTER — Telehealth (HOSPITAL_COMMUNITY): Payer: Self-pay

## 2015-11-17 NOTE — Telephone Encounter (Signed)
Post ED Visit - Positive Culture Follow-up  Culture report reviewed by antimicrobial stewardship pharmacist:  []  Elenor Quinones, Pharm.D. []  Heide Guile, Pharm.D., BCPS []  Parks Neptune, Pharm.D. []  Alycia Rossetti, Pharm.D., BCPS []  [place], Pharm.D., BCPS, AAHIVP []  Legrand Como, Pharm.D., BCPS, AAHIVP []  Milus Glazier, Pharm.D. []  Rob Evette Doffing, Pharm.D. Bonnee Quin, Pharm.D.  Positive urine culture, >/= 100,000 colonies -> E Coli & >/= 100,000 colonies -> Klebsiella Pneumoniae Treated with Cephalexin, organism sensitive to the same and no further patient follow-up is required at this time.  Dortha Kern 11/17/2015, 10:31 AM

## 2015-11-21 ENCOUNTER — Other Ambulatory Visit: Payer: Self-pay

## 2015-11-21 NOTE — Patient Outreach (Signed)
[facility]) Care Management  11/21/2015  [name] 30-Jun-1925 [id number]  Third unsuccessful attempt to reach patient by phone.  HIPAA appropriate message left requesting call back. RN will send letter to patient, and if no response case closure will be completed.  Candie Mile, RN, MSN [place] [phone] Fax [phone]

## 2015-11-23 ENCOUNTER — Ambulatory Visit (INDEPENDENT_AMBULATORY_CARE_PROVIDER_SITE_OTHER): Payer: Medicare Other | Admitting: Pharmacist

## 2015-11-23 DIAGNOSIS — I253 Aneurysm of heart: Secondary | ICD-10-CM | POA: Diagnosis not present

## 2015-11-23 DIAGNOSIS — I513 Intracardiac thrombosis, not elsewhere classified: Secondary | ICD-10-CM

## 2015-11-23 DIAGNOSIS — I213 ST elevation (STEMI) myocardial infarction of unspecified site: Secondary | ICD-10-CM

## 2015-11-23 DIAGNOSIS — E119 Type 2 diabetes mellitus without complications: Secondary | ICD-10-CM | POA: Diagnosis not present

## 2015-11-23 DIAGNOSIS — Z7901 Long term (current) use of anticoagulants: Secondary | ICD-10-CM

## 2015-11-23 DIAGNOSIS — N390 Urinary tract infection, site not specified: Secondary | ICD-10-CM | POA: Diagnosis not present

## 2015-11-23 DIAGNOSIS — I10 Essential (primary) hypertension: Secondary | ICD-10-CM | POA: Diagnosis not present

## 2015-11-23 DIAGNOSIS — Z5181 Encounter for therapeutic drug level monitoring: Secondary | ICD-10-CM | POA: Diagnosis not present

## 2015-11-23 DIAGNOSIS — K469 Unspecified abdominal hernia without obstruction or gangrene: Secondary | ICD-10-CM | POA: Diagnosis not present

## 2015-11-23 LAB — POCT INR: INR: 2.5

## 2015-11-29 ENCOUNTER — Other Ambulatory Visit: Payer: Self-pay | Admitting: Cardiology

## 2015-11-30 ENCOUNTER — Ambulatory Visit (HOSPITAL_COMMUNITY)
Admission: RE | Admit: 2015-11-30 | Discharge: 2015-11-30 | Disposition: A | Discharge status: Home / Self Care | Payer: Medicare Other | Source: Ambulatory Visit | Attending: Cardiology | Admitting: Cardiology

## 2015-11-30 VITALS — BP 144/66 | HR 66 | Wt 143.1 lb

## 2015-11-30 DIAGNOSIS — I5022 Chronic systolic (congestive) heart failure: Secondary | ICD-10-CM

## 2015-11-30 DIAGNOSIS — N183 Chronic kidney disease, stage 3 unspecified: Secondary | ICD-10-CM

## 2015-11-30 DIAGNOSIS — Z79899 Other long term (current) drug therapy: Secondary | ICD-10-CM | POA: Insufficient documentation

## 2015-11-30 DIAGNOSIS — I253 Aneurysm of heart: Secondary | ICD-10-CM

## 2015-11-30 DIAGNOSIS — Z7901 Long term (current) use of anticoagulants: Secondary | ICD-10-CM | POA: Insufficient documentation

## 2015-11-30 DIAGNOSIS — E785 Hyperlipidemia, unspecified: Secondary | ICD-10-CM | POA: Diagnosis not present

## 2015-11-30 DIAGNOSIS — R1900 Intra-abdominal and pelvic swelling, mass and lump, unspecified site: Secondary | ICD-10-CM | POA: Diagnosis not present

## 2015-11-30 DIAGNOSIS — I130 Hypertensive heart and chronic kidney disease with heart failure and stage 1 through stage 4 chronic kidney disease, or unspecified chronic kidney disease: Secondary | ICD-10-CM | POA: Insufficient documentation

## 2015-11-30 DIAGNOSIS — Z8249 Family history of ischemic heart disease and other diseases of the circulatory system: Secondary | ICD-10-CM | POA: Diagnosis not present

## 2015-11-30 DIAGNOSIS — I255 Ischemic cardiomyopathy: Secondary | ICD-10-CM | POA: Insufficient documentation

## 2015-11-30 DIAGNOSIS — I2510 Atherosclerotic heart disease of native coronary artery without angina pectoris: Secondary | ICD-10-CM | POA: Insufficient documentation

## 2015-11-30 DIAGNOSIS — I10 Essential (primary) hypertension: Secondary | ICD-10-CM

## 2015-11-30 NOTE — Patient Instructions (Signed)
Your physician recommends that you schedule a follow-up appointment in: 4 months  Do the following things EVERYDAY: 1) Weigh yourself in the morning before breakfast. Write it down and keep it in a log. 2) Take your medicines as prescribed 3) Eat low salt foods-Limit salt (sodium) to 2000 mg per day.  4) Stay as active as you can everyday 5) Limit all fluids for the day to less than 2 liters

## 2015-11-30 NOTE — Progress Notes (Signed)
Patient ID: [name], female   DOB: 03/10/1926, 82 y.o.   MRN: [id number]    Advanced Heart Failure Clinic Note   PCP: Dr. Maudie Mercury Cardiology: Dr. Aundra Dubin  82 yo with history of CAD, ischemic CMP and apical aneurysm with LV thrombus presents for cardiology followup. Last echo in 7/13 showed EF 25-30% with apical aneurysm.  Lexiscan myoview in 7/13 showed a large fixed inferolateral defect and a large predominantly fixed apical defect. EF 27% There was no significant ischemia and she was managed medically.    Seen by EP in 4/15 and decided against having an ICD generator change.  Cardiolite in 6/16 showed large apical scar but no ischemia.    Noted mass RUQ, had CT in 8/17 showing eventration of abdominal wall containing gallbladder fundus and ascending colon. No pain.   She presents today for regular follow up. Feels "fair", her usually self.  No worse or better. Did have some nonexertional chest pain this week.  Had eaten a lot of lettuce and had relief with Tums. Mostly stays in the house. Goes to church on Sundays and Wednesday. Using oxygen at night. Sleeps in recliner. Denies lightheadedness or dizziness.  SBPs in 120s at home. Had one day of pressures into the 170s a few weeks ago.   Labs (12/11): K 4.1, creatinine 1.1 Labs (4/12): K 4.6, creatinine 1.2, BUN 43, BNP 271 Labs (6/12): K 4, creatinine 1.3, LDL 66, HDL 44 Labs (10/12): K 4.8, creatinine 1.3 Labs (3/13): K 4.7, creatinine 1.4 Labs (4/13): K 4.6, creatinine 1.22, LDL 71, HDL 45 Labs (7/13): K 4.2, creatinine 1.2 Labs (10/13): K 3.8, creatinine 1.1, BNP 171 Labs (11/13): K 4, creatinine 1.2, LDL 80, HDL 46 Labs (1/14): K 3.9, creatinine 1.2 Labs (2/14): LDL 72, HDL 50 Labs (5/14): K 4, creatinine 1.5, BNP 214 Labs (9/14): K 4.4, creatinine 1.4, BNP 220, LDL 82, HDL 44 Labs (10/14): K 3.7, creatinine 1.5 Labs (1/15): K 4.3, creatinine 1.44, BNP 944 Labs (7/15): creatinine 1.5, LFTs normal, hemoglobin 12.5, LDL 95, HDL  42 Labs (11/17/13): creatinine 1.8, K+ 4.4,  Labs (12/15): K+ 4.8 creatinine 1.32 LDL 76 HDL 44 Labs (9/16): K 4.5, creatinine 1.65, hgb 11.5, LDL 88, HDL 40 Labs (11/16): K 4.7, creatinine 1.93 Labs (5/17): K 4, creatinine 1.18 Labs (6/17): LDL 76, HDL 35 Labs (8/17): K 3.6, creatinine 1.2, hgb 11.6  Allergies (verified):  1)  ! Codeine  Past Medical History: 1. HYPERCHOLESTEROLEMIA  2. ARTHRITIS: Severe low back pain.  3. ISCHEMIC CARDIOMYOPATHY: Echo (1/10) with EF 25-30%, mildly dilated LV, periapical LV aneurysm, calcified LV thrombus, moderate MR.  Medtronic ICD set to VVI.  Echo (7/12) with EF 25-30%, mid-apical anteroseptal, apical lateral, apical anterior akinesis and aneurysm of the true apex; no thrombus noted, moderate MR, PA systolic pressure 30 mmHg.  Echo 07/19/14  EF 25-30% with akinesis of distal half of LV, no apical clot, RV ok, moderate MR.  4. CAD: Anterior MI in 2006 with Cypher DES to LAD.  Lexiscan myoview (7/13) with large fixed inferolateral defect and large predominantly fixed apical defect.  There was minimal ischemia so she was managed medically.  Lexiscan Cardiolite (6/16) with EF 19%, large fixed apical defect, no ischemia.  5. HYPERTENSION 6. LV thrombus on coumadin.  7. Macular degeneration 8. CKD 9. Palpitations: Holter (9/14) with PACs and PVCs, no atrial fibrillation.  10. BPPV 11. Eventration of abdominal wall containing fundus of gallbladder and ascending colon.  Noted by CT in  8/17.   Social History: The patient lives alone near [place]. 3 daughters live nearby. Widowed Tobacco Use - No.  Alcohol Use - no  Family History:  CAD  ROS:  All systems reviewed and negative except as per HPI.    Current Outpatient Prescriptions  Medication Sig Dispense Refill  . acetaminophen (TYLENOL) 325 MG tablet Take 325-650 mg by mouth every 6 (six) hours as needed for mild pain.     Marland Kitchen atorvastatin (LIPITOR) 40 MG tablet Take 1 tablet (40 mg total) by mouth  daily at 6 PM. 30 tablet 4  . carvedilol (COREG) 25 MG tablet TAKE 1 TABLET (25 MG TOTAL) BY MOUTH 2 (TWO) TIMES DAILY. 180 tablet 3  . cholecalciferol (VITAMIN D) 1000 UNITS tablet Take 1,000 Units by mouth daily.      . folic acid (FOLVITE) 1 MG tablet Take 1 mg by mouth daily.    . furosemide (LASIX) 40 MG tablet Take 20-40 mg by mouth 2 (two) times daily. Take 40 mg (1 tablet) in the morning and 20 mg (1/2 tablet) in the afternoon    . losartan (COZAAR) 50 MG tablet Take 1 tablet (50 mg total) by mouth daily. 30 tablet 3  . meclizine (ANTIVERT) 25 MG tablet Take 25 mg by mouth 3 (three) times daily as needed for dizziness.    . nitroGLYCERIN (NITROSTAT) 0.4 MG SL tablet Place 1 tablet (0.4 mg total) under the tongue every 5 (five) minutes as needed for chest pain. 25 tablet 3  . pantoprazole (PROTONIX) 40 MG tablet Take 1 tablet (40 mg total) by mouth daily. 90 tablet 2  . polyethylene glycol (MIRALAX / GLYCOLAX) packet Take 17 g by mouth daily. 14 each 0  . saxagliptin HCl (ONGLYZA) 2.5 MG TABS tablet Take 2.5 mg by mouth daily.    . sertraline (ZOLOFT) 50 MG tablet Take 50 mg by mouth daily.     Marland Kitchen spironolactone (ALDACTONE) 25 MG tablet Take 1 tablet (25 mg total) by mouth daily. 90 tablet 1  . traMADol (ULTRAM) 50 MG tablet Take 1 tablet (50 mg total) by mouth every 6 (six) hours as needed for moderate pain. 10 tablet 0  . warfarin (COUMADIN) 2.5 MG tablet TAKE AS DIRECTED BY ANTICOAGULATION CLINIC 30 tablet 3   No current facility-administered medications for this encounter.     BP (!) 144/66   Pulse 66   Wt 143 lb 1.9 oz (64.9 kg)   SpO2 94%   BMI 28.91 kg/m    Wt Readings from Last 3 Encounters:  11/30/15 143 lb 1.9 oz (64.9 kg)  11/12/15 142 lb (64.4 kg)  10/04/15 142 lb (64.4 kg)    General:  Elderly woman in no distress.  Neck:  Neck supple, JVP 6-7 cm  Lungs:  CTAB, normal effort Heart:  Non-displaced PMI, chest non-tender; RRR, S1, S2 without rubs or gallops. 1/6  early SEM at RUSB.  Carotid upstroke normal, no bruit.   Abdomen:  Soft, NT, ND, no HSM appreciated. +BS. Large round mass on right side.  Non-tender. Extremities:  No clubbing or cyanosis. No edema.  Neurologic:  Alert and oriented x 3. Psych:  Normal affect.  Assessment/Plan:  1. CAD Stable, non-exertional chest pain.   - Continue atorvastatin.  - No ASA given stable CAD and on warfarin.  2. HYPERLIPIDEMIA - Continue statin as above. - Recent LDL stable.  3. Chronic systolic CHF  Ischemic cardiomyopathy.  EF 25-30% on last echo, 19% by Cardiolite.  NYHA class II-III symptoms, stable.  Volume status stable on current regiment.  - Continue spironolactone 25 mg, losartan 50 daily, and coreg 25 mg BID.  - She has decided against changing her ICD generator but will not have it explanted - Continue Lasix 40 qam/20 qpm.   4. H/o LV apical aneurysm with thrombus History of apical aneurysm with thrombus, thrombus not seen on last echo. - Continue warfarin. Dosing per coumadin clinic.  5. HTN Mildly elevated in clinic but relatively stable at home.  Despite reported 1 episode of BP in 170s, will not push BP meds further with age and risk of fall/hypotension.  6 CKD stage 3 Recent BMET stable.  7. R sided mass CT 11/13/15 eventration of the abdominal wall containing GB and ascending colon.  - Scheduled to see surgery at the end month. Watchful waiting.   Follow up in 4 months.   Shirley Friar, PA-C 11/30/2015   Patient seen with PA, agree with the above note.  Stable symptomatically from a cardiac standpoint.  Will not change meds today and has had recent labs. She has a right-sided abdominal mass from eventration of gallbladder and ascending colon.  No pain.  To see surgery but suspect will need observation only.   Loralie Champagne 12/01/2015

## 2015-12-05 ENCOUNTER — Other Ambulatory Visit: Payer: Self-pay

## 2015-12-05 NOTE — Patient Outreach (Signed)
[facility]) Care Management  12/05/2015  QUANESHIA HOYE October 16, 1925 [id number]  Three phone calls and a letter were not responded to by patient.  Plan:  Case closure as of this date.            RN will notify CMA and PCP.  Candie Mile, RN, MSN [place] [phone] Fax [phone]

## 2015-12-11 DIAGNOSIS — R0602 Shortness of breath: Secondary | ICD-10-CM | POA: Diagnosis not present

## 2015-12-11 DIAGNOSIS — I509 Heart failure, unspecified: Secondary | ICD-10-CM | POA: Diagnosis not present

## 2015-12-25 ENCOUNTER — Encounter: Payer: Self-pay | Admitting: Cardiology

## 2015-12-25 ENCOUNTER — Ambulatory Visit (INDEPENDENT_AMBULATORY_CARE_PROVIDER_SITE_OTHER): Payer: Medicare Other | Admitting: *Deleted

## 2015-12-25 DIAGNOSIS — E119 Type 2 diabetes mellitus without complications: Secondary | ICD-10-CM | POA: Diagnosis not present

## 2015-12-25 DIAGNOSIS — I2129 ST elevation (STEMI) myocardial infarction involving other sites: Secondary | ICD-10-CM

## 2015-12-25 DIAGNOSIS — Z5181 Encounter for therapeutic drug level monitoring: Secondary | ICD-10-CM

## 2015-12-25 DIAGNOSIS — I5020 Unspecified systolic (congestive) heart failure: Secondary | ICD-10-CM | POA: Diagnosis not present

## 2015-12-25 DIAGNOSIS — E7800 Pure hypercholesterolemia, unspecified: Secondary | ICD-10-CM | POA: Diagnosis not present

## 2015-12-25 DIAGNOSIS — I236 Thrombosis of atrium, auricular appendage, and ventricle as current complications following acute myocardial infarction: Secondary | ICD-10-CM

## 2015-12-25 DIAGNOSIS — R531 Weakness: Secondary | ICD-10-CM | POA: Diagnosis not present

## 2015-12-25 LAB — POCT INR: INR: 2.3

## 2016-01-01 DIAGNOSIS — I5020 Unspecified systolic (congestive) heart failure: Secondary | ICD-10-CM | POA: Diagnosis not present

## 2016-01-01 DIAGNOSIS — Z23 Encounter for immunization: Secondary | ICD-10-CM | POA: Diagnosis not present

## 2016-01-01 DIAGNOSIS — I10 Essential (primary) hypertension: Secondary | ICD-10-CM | POA: Diagnosis not present

## 2016-01-01 DIAGNOSIS — E119 Type 2 diabetes mellitus without complications: Secondary | ICD-10-CM | POA: Diagnosis not present

## 2016-01-01 DIAGNOSIS — I429 Cardiomyopathy, unspecified: Secondary | ICD-10-CM | POA: Diagnosis not present

## 2016-01-02 DIAGNOSIS — N390 Urinary tract infection, site not specified: Secondary | ICD-10-CM | POA: Diagnosis not present

## 2016-01-02 DIAGNOSIS — R1900 Intra-abdominal and pelvic swelling, mass and lump, unspecified site: Secondary | ICD-10-CM | POA: Diagnosis not present

## 2016-01-04 ENCOUNTER — Other Ambulatory Visit (HOSPITAL_COMMUNITY): Payer: Self-pay | Admitting: Internal Medicine

## 2016-01-04 ENCOUNTER — Other Ambulatory Visit (HOSPITAL_COMMUNITY): Payer: Self-pay | Admitting: Student

## 2016-01-10 DIAGNOSIS — R0602 Shortness of breath: Secondary | ICD-10-CM | POA: Diagnosis not present

## 2016-01-10 DIAGNOSIS — I509 Heart failure, unspecified: Secondary | ICD-10-CM | POA: Diagnosis not present

## 2016-01-12 ENCOUNTER — Other Ambulatory Visit: Payer: Self-pay | Admitting: Cardiology

## 2016-01-22 ENCOUNTER — Ambulatory Visit (INDEPENDENT_AMBULATORY_CARE_PROVIDER_SITE_OTHER): Payer: Medicare Other | Admitting: *Deleted

## 2016-01-22 DIAGNOSIS — I2129 ST elevation (STEMI) myocardial infarction involving other sites: Secondary | ICD-10-CM

## 2016-01-22 DIAGNOSIS — I236 Thrombosis of atrium, auricular appendage, and ventricle as current complications following acute myocardial infarction: Secondary | ICD-10-CM

## 2016-01-22 DIAGNOSIS — Z5181 Encounter for therapeutic drug level monitoring: Secondary | ICD-10-CM

## 2016-01-22 LAB — POCT INR: INR: 3.4

## 2016-02-04 ENCOUNTER — Ambulatory Visit (INDEPENDENT_AMBULATORY_CARE_PROVIDER_SITE_OTHER): Payer: Medicare Other | Admitting: *Deleted

## 2016-02-04 DIAGNOSIS — I236 Thrombosis of atrium, auricular appendage, and ventricle as current complications following acute myocardial infarction: Secondary | ICD-10-CM

## 2016-02-04 DIAGNOSIS — Z5181 Encounter for therapeutic drug level monitoring: Secondary | ICD-10-CM

## 2016-02-04 DIAGNOSIS — I2129 ST elevation (STEMI) myocardial infarction involving other sites: Secondary | ICD-10-CM

## 2016-02-04 LAB — POCT INR: INR: 1.5

## 2016-02-06 ENCOUNTER — Inpatient Hospital Stay (HOSPITAL_COMMUNITY)
Admission: EM | Admit: 2016-02-06 | Discharge: 2016-02-08 | DRG: 177 | Disposition: A | Discharge status: Skilled Nursing Facility | Payer: Medicare Other | Attending: Internal Medicine | Admitting: Internal Medicine

## 2016-02-06 ENCOUNTER — Encounter (HOSPITAL_COMMUNITY): Payer: Self-pay | Admitting: Emergency Medicine

## 2016-02-06 ENCOUNTER — Emergency Department (HOSPITAL_COMMUNITY): Payer: Medicare Other

## 2016-02-06 DIAGNOSIS — N182 Chronic kidney disease, stage 2 (mild): Secondary | ICD-10-CM | POA: Diagnosis present

## 2016-02-06 DIAGNOSIS — I5020 Unspecified systolic (congestive) heart failure: Secondary | ICD-10-CM | POA: Diagnosis not present

## 2016-02-06 DIAGNOSIS — E119 Type 2 diabetes mellitus without complications: Secondary | ICD-10-CM | POA: Diagnosis not present

## 2016-02-06 DIAGNOSIS — I130 Hypertensive heart and chronic kidney disease with heart failure and stage 1 through stage 4 chronic kidney disease, or unspecified chronic kidney disease: Secondary | ICD-10-CM | POA: Diagnosis present

## 2016-02-06 DIAGNOSIS — M6281 Muscle weakness (generalized): Secondary | ICD-10-CM | POA: Diagnosis not present

## 2016-02-06 DIAGNOSIS — Y93E1 Activity, personal bathing and showering: Secondary | ICD-10-CM

## 2016-02-06 DIAGNOSIS — Z66 Do not resuscitate: Secondary | ICD-10-CM | POA: Diagnosis present

## 2016-02-06 DIAGNOSIS — N390 Urinary tract infection, site not specified: Secondary | ICD-10-CM | POA: Diagnosis present

## 2016-02-06 DIAGNOSIS — I2510 Atherosclerotic heart disease of native coronary artery without angina pectoris: Secondary | ICD-10-CM | POA: Diagnosis present

## 2016-02-06 DIAGNOSIS — B961 Klebsiella pneumoniae [K. pneumoniae] as the cause of diseases classified elsewhere: Secondary | ICD-10-CM | POA: Diagnosis present

## 2016-02-06 DIAGNOSIS — R1311 Dysphagia, oral phase: Secondary | ICD-10-CM | POA: Diagnosis not present

## 2016-02-06 DIAGNOSIS — E7800 Pure hypercholesterolemia, unspecified: Secondary | ICD-10-CM | POA: Diagnosis not present

## 2016-02-06 DIAGNOSIS — J690 Pneumonitis due to inhalation of food and vomit: Secondary | ICD-10-CM | POA: Diagnosis not present

## 2016-02-06 DIAGNOSIS — Z87442 Personal history of urinary calculi: Secondary | ICD-10-CM

## 2016-02-06 DIAGNOSIS — I429 Cardiomyopathy, unspecified: Secondary | ICD-10-CM | POA: Diagnosis not present

## 2016-02-06 DIAGNOSIS — E860 Dehydration: Secondary | ICD-10-CM | POA: Diagnosis not present

## 2016-02-06 DIAGNOSIS — Z7401 Bed confinement status: Secondary | ICD-10-CM | POA: Diagnosis not present

## 2016-02-06 DIAGNOSIS — E785 Hyperlipidemia, unspecified: Secondary | ICD-10-CM | POA: Diagnosis present

## 2016-02-06 DIAGNOSIS — R0902 Hypoxemia: Secondary | ICD-10-CM

## 2016-02-06 DIAGNOSIS — Z79899 Other long term (current) drug therapy: Secondary | ICD-10-CM | POA: Diagnosis not present

## 2016-02-06 DIAGNOSIS — R112 Nausea with vomiting, unspecified: Secondary | ICD-10-CM | POA: Diagnosis not present

## 2016-02-06 DIAGNOSIS — E1122 Type 2 diabetes mellitus with diabetic chronic kidney disease: Secondary | ICD-10-CM | POA: Diagnosis present

## 2016-02-06 DIAGNOSIS — I5043 Acute on chronic combined systolic (congestive) and diastolic (congestive) heart failure: Secondary | ICD-10-CM | POA: Diagnosis not present

## 2016-02-06 DIAGNOSIS — Z90710 Acquired absence of both cervix and uterus: Secondary | ICD-10-CM | POA: Diagnosis not present

## 2016-02-06 DIAGNOSIS — M810 Age-related osteoporosis without current pathological fracture: Secondary | ICD-10-CM | POA: Diagnosis not present

## 2016-02-06 DIAGNOSIS — Z7901 Long term (current) use of anticoagulants: Secondary | ICD-10-CM | POA: Diagnosis not present

## 2016-02-06 DIAGNOSIS — Z955 Presence of coronary angioplasty implant and graft: Secondary | ICD-10-CM | POA: Diagnosis not present

## 2016-02-06 DIAGNOSIS — E1140 Type 2 diabetes mellitus with diabetic neuropathy, unspecified: Secondary | ICD-10-CM | POA: Diagnosis not present

## 2016-02-06 DIAGNOSIS — M1990 Unspecified osteoarthritis, unspecified site: Secondary | ICD-10-CM | POA: Diagnosis not present

## 2016-02-06 DIAGNOSIS — D638 Anemia in other chronic diseases classified elsewhere: Secondary | ICD-10-CM | POA: Diagnosis present

## 2016-02-06 DIAGNOSIS — R531 Weakness: Secondary | ICD-10-CM

## 2016-02-06 DIAGNOSIS — R110 Nausea: Secondary | ICD-10-CM | POA: Diagnosis not present

## 2016-02-06 DIAGNOSIS — J189 Pneumonia, unspecified organism: Secondary | ICD-10-CM | POA: Diagnosis present

## 2016-02-06 DIAGNOSIS — K219 Gastro-esophageal reflux disease without esophagitis: Secondary | ICD-10-CM | POA: Diagnosis not present

## 2016-02-06 DIAGNOSIS — Z9981 Dependence on supplemental oxygen: Secondary | ICD-10-CM

## 2016-02-06 DIAGNOSIS — W182XXA Fall in (into) shower or empty bathtub, initial encounter: Secondary | ICD-10-CM | POA: Diagnosis present

## 2016-02-06 DIAGNOSIS — B9620 Unspecified Escherichia coli [E. coli] as the cause of diseases classified elsewhere: Secondary | ICD-10-CM | POA: Diagnosis not present

## 2016-02-06 DIAGNOSIS — N189 Chronic kidney disease, unspecified: Secondary | ICD-10-CM | POA: Diagnosis not present

## 2016-02-06 DIAGNOSIS — I252 Old myocardial infarction: Secondary | ICD-10-CM | POA: Diagnosis not present

## 2016-02-06 DIAGNOSIS — Z823 Family history of stroke: Secondary | ICD-10-CM | POA: Diagnosis not present

## 2016-02-06 DIAGNOSIS — I10 Essential (primary) hypertension: Secondary | ICD-10-CM | POA: Diagnosis not present

## 2016-02-06 DIAGNOSIS — R2689 Other abnormalities of gait and mobility: Secondary | ICD-10-CM | POA: Diagnosis not present

## 2016-02-06 DIAGNOSIS — R278 Other lack of coordination: Secondary | ICD-10-CM | POA: Diagnosis not present

## 2016-02-06 DIAGNOSIS — I236 Thrombosis of atrium, auricular appendage, and ventricle as current complications following acute myocardial infarction: Secondary | ICD-10-CM | POA: Diagnosis not present

## 2016-02-06 DIAGNOSIS — R030 Elevated blood-pressure reading, without diagnosis of hypertension: Secondary | ICD-10-CM | POA: Diagnosis not present

## 2016-02-06 DIAGNOSIS — J181 Lobar pneumonia, unspecified organism: Secondary | ICD-10-CM | POA: Diagnosis not present

## 2016-02-06 DIAGNOSIS — Z833 Family history of diabetes mellitus: Secondary | ICD-10-CM

## 2016-02-06 DIAGNOSIS — D631 Anemia in chronic kidney disease: Secondary | ICD-10-CM | POA: Diagnosis not present

## 2016-02-06 DIAGNOSIS — N200 Calculus of kidney: Secondary | ICD-10-CM | POA: Diagnosis not present

## 2016-02-06 DIAGNOSIS — R279 Unspecified lack of coordination: Secondary | ICD-10-CM | POA: Diagnosis not present

## 2016-02-06 DIAGNOSIS — I255 Ischemic cardiomyopathy: Secondary | ICD-10-CM | POA: Diagnosis not present

## 2016-02-06 DIAGNOSIS — I220 Subsequent ST elevation (STEMI) myocardial infarction of anterior wall: Secondary | ICD-10-CM | POA: Diagnosis not present

## 2016-02-06 LAB — CBC WITH DIFFERENTIAL/PLATELET
BASOS PCT: 0 %
Basophils Absolute: 0 10*3/uL (ref 0.0–0.1)
EOS ABS: 0 10*3/uL (ref 0.0–0.7)
Eosinophils Relative: 0 %
HCT: 33.2 % — ABNORMAL LOW (ref 36.0–46.0)
HEMOGLOBIN: 10.8 g/dL — AB (ref 12.0–15.0)
LYMPHS ABS: 1 10*3/uL (ref 0.7–4.0)
Lymphocytes Relative: 17 %
MCH: 30.9 pg (ref 26.0–34.0)
MCHC: 32.5 g/dL (ref 30.0–36.0)
MCV: 94.9 fL (ref 78.0–100.0)
MONO ABS: 0.3 10*3/uL (ref 0.1–1.0)
MONOS PCT: 5 %
Neutro Abs: 4.7 10*3/uL (ref 1.7–7.7)
Neutrophils Relative %: 78 %
Platelets: 178 10*3/uL (ref 150–400)
RBC: 3.5 MIL/uL — ABNORMAL LOW (ref 3.87–5.11)
RDW: 12.6 % (ref 11.5–15.5)
WBC: 6 10*3/uL (ref 4.0–10.5)

## 2016-02-06 LAB — URINALYSIS, ROUTINE W REFLEX MICROSCOPIC
BILIRUBIN URINE: NEGATIVE
Glucose, UA: NEGATIVE mg/dL
KETONES UR: 15 mg/dL — AB
NITRITE: NEGATIVE
PH: 6 (ref 5.0–8.0)
SPECIFIC GRAVITY, URINE: 1.01 (ref 1.005–1.030)

## 2016-02-06 LAB — COMPREHENSIVE METABOLIC PANEL
ALBUMIN: 3.7 g/dL (ref 3.5–5.0)
ALK PHOS: 51 U/L (ref 38–126)
ALT: 12 U/L — AB (ref 14–54)
AST: 21 U/L (ref 15–41)
Anion gap: 7 (ref 5–15)
BILIRUBIN TOTAL: 1 mg/dL (ref 0.3–1.2)
BUN: 30 mg/dL — AB (ref 6–20)
CALCIUM: 9.2 mg/dL (ref 8.9–10.3)
CO2: 27 mmol/L (ref 22–32)
CREATININE: 1.08 mg/dL — AB (ref 0.44–1.00)
Chloride: 102 mmol/L (ref 101–111)
GFR calc Af Amer: 52 mL/min — ABNORMAL LOW (ref >60)
GFR, EST NON AFRICAN AMERICAN: 44 mL/min — AB (ref >60)
GLUCOSE: 107 mg/dL — AB (ref 65–99)
POTASSIUM: 3.9 mmol/L (ref 3.5–5.1)
Sodium: 136 mmol/L (ref 135–145)
TOTAL PROTEIN: 7 g/dL (ref 6.5–8.1)

## 2016-02-06 LAB — URINE MICROSCOPIC-ADD ON

## 2016-02-06 LAB — LIPASE, BLOOD: LIPASE: 23 U/L (ref 11–51)

## 2016-02-06 LAB — LACTIC ACID, PLASMA
LACTIC ACID, VENOUS: 1.2 mmol/L (ref 0.5–1.9)
LACTIC ACID, VENOUS: 0.9 mmol/L (ref 0.5–1.9)

## 2016-02-06 LAB — GLUCOSE, CAPILLARY: Glucose-Capillary: 163 mg/dL — ABNORMAL HIGH (ref 65–99)

## 2016-02-06 LAB — PROTIME-INR
INR: 1.59
Prothrombin Time: 19.1 seconds — ABNORMAL HIGH (ref 11.4–15.2)

## 2016-02-06 LAB — TSH: TSH: 0.781 u[IU]/mL (ref 0.350–4.500)

## 2016-02-06 LAB — TROPONIN I: Troponin I: <0.03 ng/mL (ref <0.03)

## 2016-02-06 MED ORDER — ACETAMINOPHEN 325 MG PO TABS: 325.0000 mg | ORAL_TABLET | Freq: Four times a day (QID) | ORAL | Status: DC | PRN

## 2016-02-06 MED ORDER — WARFARIN SODIUM 5 MG PO TABS
2.5000 mg | ORAL_TABLET | Freq: Once | ORAL | Status: AC
  Administered 2016-02-06: 2.5 mg via ORAL

## 2016-02-06 MED ORDER — POLYETHYLENE GLYCOL 3350 17 G PO PACK
17.0000 g | PACK | Freq: Every day | ORAL | Status: DC
  Administered 2016-02-07 – 2016-02-08 (×2): 17 g via ORAL
  Filled 2016-02-06 (×2): qty 1

## 2016-02-06 MED ORDER — SODIUM CHLORIDE 0.9% FLUSH
3.0000 mL | Freq: Two times a day (BID) | INTRAVENOUS | Status: DC
  Administered 2016-02-06 – 2016-02-07 (×2): 3 mL via INTRAVENOUS

## 2016-02-06 MED ORDER — NITROGLYCERIN 0.4 MG SL SUBL: 0.4000 mg | SUBLINGUAL_TABLET | SUBLINGUAL | Status: DC | PRN

## 2016-02-06 MED ORDER — DEXTROSE 5 % IV SOLN
1.0000 g | INTRAVENOUS | Status: DC
  Administered 2016-02-07: 1 g via INTRAVENOUS
  Filled 2016-02-06 (×5): qty 10

## 2016-02-06 MED ORDER — DEXTROSE 5 % IV SOLN
1.0000 g | Freq: Once | INTRAVENOUS | Status: AC
  Administered 2016-02-06: 1 g via INTRAVENOUS
  Filled 2016-02-06: qty 10

## 2016-02-06 MED ORDER — CARVEDILOL 12.5 MG PO TABS
25.0000 mg | ORAL_TABLET | Freq: Two times a day (BID) | ORAL | Status: DC
  Administered 2016-02-06 – 2016-02-08 (×4): 25 mg via ORAL
  Filled 2016-02-06 (×4): qty 2

## 2016-02-06 MED ORDER — SODIUM CHLORIDE 0.9 % IV SOLN
INTRAVENOUS | Status: DC
  Administered 2016-02-06: 12:00:00 via INTRAVENOUS

## 2016-02-06 MED ORDER — DOXYCYCLINE HYCLATE 100 MG IV SOLR
INTRAVENOUS | Status: AC
Start: 2016-02-06 — End: 2016-02-06
  Filled 2016-02-06: qty 100

## 2016-02-06 MED ORDER — LOSARTAN POTASSIUM 50 MG PO TABS
50.0000 mg | ORAL_TABLET | Freq: Every day | ORAL | Status: DC
  Administered 2016-02-07 – 2016-02-08 (×2): 50 mg via ORAL
  Filled 2016-02-06 (×2): qty 1

## 2016-02-06 MED ORDER — SERTRALINE HCL 50 MG PO TABS
50.0000 mg | ORAL_TABLET | Freq: Every day | ORAL | Status: DC
  Administered 2016-02-07 – 2016-02-08 (×2): 50 mg via ORAL
  Filled 2016-02-06 (×2): qty 1

## 2016-02-06 MED ORDER — WARFARIN SODIUM 2.5 MG PO TABS
ORAL_TABLET | ORAL | Status: AC
Start: 2016-02-06 — End: 2016-02-06
  Filled 2016-02-06: qty 1

## 2016-02-06 MED ORDER — VITAMIN D 1000 UNITS PO TABS
1000.0000 [IU] | ORAL_TABLET | Freq: Every day | ORAL | Status: DC
  Administered 2016-02-07 – 2016-02-08 (×2): 1000 [IU] via ORAL
  Filled 2016-02-06 (×2): qty 1

## 2016-02-06 MED ORDER — DOXYCYCLINE HYCLATE 100 MG PO TABS
100.0000 mg | ORAL_TABLET | Freq: Once | ORAL | Status: AC
  Administered 2016-02-06: 100 mg via ORAL
  Filled 2016-02-06: qty 1

## 2016-02-06 MED ORDER — SODIUM CHLORIDE 0.9 % IV SOLN
INTRAVENOUS | Status: AC
  Administered 2016-02-06: 21:00:00 via INTRAVENOUS

## 2016-02-06 MED ORDER — FOLIC ACID 1 MG PO TABS
1.0000 mg | ORAL_TABLET | Freq: Every day | ORAL | Status: DC
  Administered 2016-02-07 – 2016-02-08 (×2): 1 mg via ORAL
  Filled 2016-02-06 (×2): qty 1

## 2016-02-06 MED ORDER — SPIRONOLACTONE 25 MG PO TABS
25.0000 mg | ORAL_TABLET | Freq: Every day | ORAL | Status: DC
  Administered 2016-02-07 – 2016-02-08 (×2): 25 mg via ORAL
  Filled 2016-02-06 (×2): qty 1

## 2016-02-06 MED ORDER — IOPAMIDOL (ISOVUE-300) INJECTION 61%
INTRAVENOUS | Status: AC
  Administered 2016-02-06: 30 mL via ORAL
  Filled 2016-02-06: qty 30

## 2016-02-06 MED ORDER — IOPAMIDOL (ISOVUE-300) INJECTION 61%
75.0000 mL | Freq: Once | INTRAVENOUS | Status: AC | PRN
  Administered 2016-02-06: 75 mL via INTRAVENOUS

## 2016-02-06 MED ORDER — ACETAMINOPHEN 650 MG RE SUPP: 650.0000 mg | Freq: Four times a day (QID) | RECTAL | Status: DC | PRN

## 2016-02-06 MED ORDER — SODIUM CHLORIDE 0.9 % IV SOLN: INTRAVENOUS | Status: DC

## 2016-02-06 MED ORDER — TRAMADOL HCL 50 MG PO TABS
50.0000 mg | ORAL_TABLET | Freq: Four times a day (QID) | ORAL | Status: DC | PRN
  Administered 2016-02-07 – 2016-02-08 (×5): 50 mg via ORAL
  Filled 2016-02-06 (×5): qty 1

## 2016-02-06 MED ORDER — ONDANSETRON HCL 4 MG/2ML IJ SOLN
4.0000 mg | INTRAMUSCULAR | Status: DC | PRN
Start: 2016-02-06 — End: 2016-02-08
  Administered 2016-02-06: 4 mg via INTRAVENOUS
  Filled 2016-02-06: qty 2

## 2016-02-06 MED ORDER — INSULIN ASPART 100 UNIT/ML ~~LOC~~ SOLN: 0.0000 [IU] | Freq: Every day | SUBCUTANEOUS | Status: DC

## 2016-02-06 MED ORDER — PANTOPRAZOLE SODIUM 40 MG PO TBEC
40.0000 mg | DELAYED_RELEASE_TABLET | Freq: Every day | ORAL | Status: DC
  Administered 2016-02-07 – 2016-02-08 (×2): 40 mg via ORAL
  Filled 2016-02-06 (×2): qty 1

## 2016-02-06 MED ORDER — WARFARIN - PHARMACIST DOSING INPATIENT: Status: DC

## 2016-02-06 MED ORDER — INSULIN ASPART 100 UNIT/ML ~~LOC~~ SOLN
0.0000 [IU] | Freq: Three times a day (TID) | SUBCUTANEOUS | Status: DC
  Administered 2016-02-07: 3 [IU] via SUBCUTANEOUS
  Administered 2016-02-07: 2 [IU] via SUBCUTANEOUS

## 2016-02-06 MED ORDER — ATORVASTATIN CALCIUM 40 MG PO TABS
40.0000 mg | ORAL_TABLET | Freq: Every day | ORAL | Status: DC
  Administered 2016-02-07: 40 mg via ORAL
  Filled 2016-02-06: qty 1

## 2016-02-06 MED ORDER — GABAPENTIN 100 MG PO CAPS
100.0000 mg | ORAL_CAPSULE | Freq: Three times a day (TID) | ORAL | Status: DC
  Administered 2016-02-06 – 2016-02-08 (×5): 100 mg via ORAL
  Filled 2016-02-06 (×5): qty 1

## 2016-02-06 MED ORDER — ACETAMINOPHEN 325 MG PO TABS
650.0000 mg | ORAL_TABLET | Freq: Four times a day (QID) | ORAL | Status: DC | PRN
  Administered 2016-02-07 – 2016-02-08 (×2): 650 mg via ORAL
  Filled 2016-02-06 (×2): qty 2

## 2016-02-06 MED ORDER — DEXTROSE 5 % IV SOLN
100.0000 mg | Freq: Two times a day (BID) | INTRAVENOUS | Status: DC
  Administered 2016-02-07 – 2016-02-08 (×3): 100 mg via INTRAVENOUS
  Filled 2016-02-06 (×10): qty 100

## 2016-02-06 NOTE — ED Notes (Signed)
Pt taken off 02 for trial

## 2016-02-06 NOTE — ED Notes (Signed)
Daughter came by. States pt lives alone and is getting weaker. Fell in bathtub

## 2016-02-06 NOTE — ED Notes (Signed)
SW called back and will bring down list of places for daughter to call.

## 2016-02-06 NOTE — ED Notes (Signed)
Pt taken to CT. Oswald Hillock, RN

## 2016-02-06 NOTE — ED Notes (Signed)
MD and RN made aware that the pt o2 drop to 83%.

## 2016-02-06 NOTE — ED Notes (Signed)
Dr. Marin Comment in with pt

## 2016-02-06 NOTE — ED Notes (Signed)
Floor unable to take report at this time.

## 2016-02-06 NOTE — ED Provider Notes (Signed)
[facility] DEPT Provider Note   CSN: [id number] Arrival date & time: 02/06/16  1135     History   Chief Complaint Chief Complaint  Patient presents with  . Nausea    HPI [name] is a 82 y.o. female.  HPI  Pt was seen at 1205. Per pt, c/o gradual onset and persistence of multiple intermittent episodes of N/V that began earlier today.  Has been associated with mild generalized abd "pain." States she has had generalized weakness for the past several days. Pt's family states pt fell in the bathtub yesterday due to her weakness. Denies hitting head, no LOC, no AMS, no neck or back pain.  Denies diarrhea, no CP/SOB, no back pain, no fevers, no black or blood in stools or emesis, no focal motor weakness, no tingling/numbness in extremities.    Past Medical History:  Diagnosis Date  . Arthritis    severe lower back pain  . CAD (coronary artery disease)    anterior MI in 2006 with Cypher DES to LAD  . Cardiomyopathy   . CHF (congestive heart failure) ([place])   . CKD (chronic kidney disease) stage 3, GFR 30-59 ml/min    Stage III-stage IV  . HTN (hypertension)   . LV (left ventricular) mural thrombus    on coumadin   . MI, old   . Nephrolithiasis   . OA (osteoarthritis) of hip   . On home oxygen therapy    at night  . Osteoporosis   . Other specified forms of chronic ischemic heart disease    echo (1/10) with EF 25-30%, mildly dilated LV, periapical LV aneueysm, calcified LV thombus, moderate MR. Medtronic ICD set to VVI.   Marland Kitchen Pure hypercholesterolemia     Patient Active Problem List   Diagnosis Date Noted  . CKD (chronic kidney disease) stage 3, GFR 30-59 ml/min 04/26/2015  . Urinary tract infection 04/25/2015  . Cardiomyopathy, ischemic 04/25/2015  . Fecal impaction ([place]) 04/25/2015  . Degenerative joint disease 04/25/2015  . Chest pain, precordial 08/28/2014  . Encounter for therapeutic drug monitoring 05/02/2013  . CKD (chronic kidney disease) 10/22/2011  .  Angina at rest [facility]) 10/08/2011  . UTI (lower urinary tract infection) 10/08/2011  . Ventricular tachycardia ([place]) 06/10/2011  . Systolic CHF, chronic ([place]) 12/11/2010  . LV (left ventricular) mural thrombus 12/11/2010  . Long term (current) use of anticoagulants 06/25/2010  . ICD-Medtronic 05/01/2009  . HYPERCHOLESTEROLEMIA 06/19/2008  . Hyperlipidemia 06/19/2008  . HTN (hypertension) 06/19/2008  . Coronary artery disease due to calcified coronary lesion 06/19/2008  . Aneurysm of heart wall 06/19/2008  . UNSPECIFIED SYSTOLIC HEART FAILURE 99991111  . ARTHRITIS 06/19/2008  . SHORTNESS OF BREATH 06/19/2008    Past Surgical History:  Procedure Laterality Date  . AICD implantation     ICD-Medtronic. Remote-yes   . CARDIAC CATHETERIZATION    . CORONARY ANGIOPLASTY    . CORONARY ANGIOPLASTY WITH STENT PLACEMENT    . VESICOVAGINAL FISTULA CLOSURE W/ TAH  1978       Home Medications    Prior to Admission medications   Medication Sig Start Date End Date Taking? Authorizing Provider  acetaminophen (TYLENOL) 325 MG tablet Take 325-650 mg by mouth every 6 (six) hours as needed for mild pain.    Yes Historical Provider, MD  atorvastatin (LIPITOR) 40 MG tablet Take 1 tablet (40 mg total) by mouth daily at 6 PM. 08/29/15  Yes Larey Dresser, MD  carvedilol (COREG) 25 MG tablet Take 1 Tablet  by mouth 2 times a day 11/30/15  Yes Larey Dresser, MD  cholecalciferol (VITAMIN D) 1000 UNITS tablet Take 1,000 Units by mouth daily.     Yes Historical Provider, MD  folic acid (FOLVITE) 1 MG tablet Take 1 mg by mouth daily.   Yes Historical Provider, MD  furosemide (LASIX) 40 MG tablet Take 40 mg (1 tablet) in the morning and 20 mg (1/2 tablet) in the afternoon 01/07/16  Yes Shaune Pascal Bensimhon, MD  gabapentin (NEURONTIN) 100 MG capsule 1 capsule 3 (three) times daily. 01/14/16  Yes Historical Provider, MD  losartan (COZAAR) 50 MG tablet Take 1 Tablet by mouth once daily 01/07/16  Yes Shirley Friar, PA-C  pantoprazole (PROTONIX) 40 MG tablet Take 1 Tablet by mouth once daily 01/15/16  Yes Larey Dresser, MD  polyethylene glycol [facility] / GLYCOLAX) packet Take 17 g by mouth daily. 04/28/15  Yes Domenic Polite, MD  saxagliptin HCl (ONGLYZA) 2.5 MG TABS tablet Take 2.5 mg by mouth daily.   Yes Historical Provider, MD  sertraline (ZOLOFT) 50 MG tablet Take 50 mg by mouth daily.    Yes Historical Provider, MD  spironolactone (ALDACTONE) 25 MG tablet Take 1 tablet (25 mg total) by mouth daily. 07/16/15  Yes Larey Dresser, MD  traMADol (ULTRAM) 50 MG tablet Take 1 tablet (50 mg total) by mouth every 6 (six) hours as needed for moderate pain. 09/12/15  Yes Julianne Rice, MD  warfarin (COUMADIN) 2.5 MG tablet TAKE AS DIRECTED BY ANTICOAGULATION CLINIC 06/08/15  Yes Evans Lance, MD  nitroGLYCERIN (NITROSTAT) 0.4 MG SL tablet Place 1 tablet (0.4 mg total) under the tongue every 5 (five) minutes as needed for chest pain. 02/12/15   Evans Lance, MD    Family History Family History  Problem Relation Age of Onset  . Stroke Father   . Breast cancer Mother   . Stroke Brother   . Diabetes Brother     Social History Social History  Substance Use Topics  . Smoking status: Never Smoker  . Smokeless tobacco: Never Used  . Alcohol use No     Allergies   Codeine   Review of Systems Review of Systems ROS: Statement: All systems negative except as marked or noted in the HPI; Constitutional: Negative for fever and chills. +generalized weakness/fatigue.; ; Eyes: Negative for eye pain, redness and discharge. ; ; ENMT: Negative for ear pain, hoarseness, nasal congestion, sinus pressure and sore throat. ; ; Cardiovascular: Negative for chest pain, palpitations, diaphoresis, dyspnea and peripheral edema. ; ; Respiratory: Negative for cough, wheezing and stridor. ; ; Gastrointestinal: +N/V, abd pain. Negative for diarrhea, blood in stool, hematemesis, jaundice and rectal bleeding. . ; ;  Genitourinary: Negative for dysuria, flank pain and hematuria. ; ; Musculoskeletal: Negative for back pain and neck pain. Negative for swelling and trauma.; ; Skin: Negative for pruritus, rash, abrasions, blisters, bruising and skin lesion.; ; Neuro: Negative for headache, lightheadedness and neck stiffness. Negative for altered level of consciousness, altered mental status, extremity weakness, paresthesias, involuntary movement, seizure and syncope.       Physical Exam Updated Vital Signs BP 133/79   Pulse 74   Temp 98.2 F (36.8 C) (Oral)   Resp 19   Ht 4\' 11"  (1.499 m)   Wt 150 lb (68 kg)   SpO2 99%   BMI 30.30 kg/m   13:40 Orthostatic Vital Signs LB  Orthostatic Lying   BP- Lying: 151/66  Pulse- Lying: 66  Orthostatic Sitting  BP- Sitting: 154/87  Pulse- Sitting: 71      Orthostatic Standing at 0 minutes  BP- Standing at 0 minutes: 154/69 (Patient denies short of breath and dizziness. )  Pulse- Standing at 0 minutes: 73    Physical Exam 1210: Physical examination:  Nursing notes reviewed; Vital signs and O2 SAT reviewed;  Constitutional: Well developed, Well nourished, In no acute distress; Head:  Normocephalic, atraumatic; Eyes: EOMI, PERRL, No scleral icterus; ENMT: Mouth and pharynx normal, Mucous membranes dry; Neck: Supple, Full range of motion, No lymphadenopathy; Cardiovascular: Regular rate and rhythm, No gallop; Respiratory: Breath sounds coarse & equal bilaterally, No wheezes.  Speaking full sentences with ease, Normal respiratory effort/excursion; Chest: Nontender, Movement normal; Abdomen: Soft, +mild diffuse tenderness to palp. Nondistended, Normal bowel sounds; Genitourinary: No CVA tenderness; Extremities: Pulses normal, No tenderness, No edema, No calf edema or asymmetry.; Neuro: AA&Ox3, Major CN grossly intact.  Speech clear. No gross focal motor or sensory deficits in extremities.; Skin: Color normal, Warm, Dry.   ED Treatments / Results  Labs (all  labs ordered are listed, but only abnormal results are displayed)   EKG  EKG Interpretation  Date/Time:  Wednesday February 06 2016 13:24:08 EST Ventricular Rate:  68 PR Interval:    QRS Duration: 120 QT Interval:  396 QTC Calculation: 422 R Axis:   -67 Text Interpretation:  Sinus rhythm Probable left atrial enlargement Left axis deviation Left anterior fascicular block Left ventricular hypertrophy Probable anterior infarct, age indeterminate Lateral leads are also involved When compared with ECG of 08/04/2014 No significant change was found Confirmed by [facility]  MD, Nunzio Cory [phone]) on 02/06/2016 2:18:32 PM       Radiology   Procedures Procedures (including critical care time)  Medications Ordered in ED Medications  0.9 %  sodium chloride infusion ( Intravenous New Bag/Given 02/06/16 1229)  ondansetron (ZOFRAN) injection 4 mg (4 mg Intravenous Given 02/06/16 1240)  iopamidol (ISOVUE-300) 61 % injection (30 mLs Oral Contrast Given 02/06/16 1230)  cefTRIAXone (ROCEPHIN) 1 g in dextrose 5 % 50 mL IVPB (0 g Intravenous Stopped 02/06/16 1451)  doxycycline (VIBRA-TABS) tablet 100 mg (100 mg Oral Given 02/06/16 1400)  iopamidol (ISOVUE-300) 61 % injection 75 mL (75 mLs Intravenous Contrast Given 02/06/16 1457)     Initial Impression / Assessment and Plan / ED Course  I have reviewed the triage vital signs and the nursing notes.  Pertinent labs & imaging results that were available during my care of the patient were reviewed by me and considered in my medical decision making (see chart for details).  MDM Reviewed: previous chart, vitals and nursing note Reviewed previous: labs and ECG Interpretation: labs, ECG, x-ray and CT scan    Results for orders placed or performed during the hospital encounter of 02/06/16  Comprehensive metabolic panel  Result Value Ref Range   Sodium 136 135 - 145 mmol/L   Potassium 3.9 3.5 - 5.1 mmol/L   Chloride 102 101 - 111 mmol/L   CO2 27 22 -  32 mmol/L   Glucose, Bld 107 (H) 65 - 99 mg/dL   BUN 30 (H) 6 - 20 mg/dL   Creatinine, Ser 1.08 (H) 0.44 - 1.00 mg/dL   Calcium 9.2 8.9 - 10.3 mg/dL   Total Protein 7.0 6.5 - 8.1 g/dL   Albumin 3.7 3.5 - 5.0 g/dL   AST 21 15 - 41 U/L   ALT 12 (L) 14 - 54 U/L   Alkaline Phosphatase 51 38 -  126 U/L   Total Bilirubin 1.0 0.3 - 1.2 mg/dL   GFR calc non Af Amer 44 (L) >60 mL/min   GFR calc Af Amer 52 (L) >60 mL/min   Anion gap 7 5 - 15  Lipase, blood  Result Value Ref Range   Lipase 23 11 - 51 U/L  Troponin I  Result Value Ref Range   Troponin I <0.03 <0.03 ng/mL  CBC with Differential  Result Value Ref Range   WBC 6.0 4.0 - 10.5 K/uL   RBC 3.50 (L) 3.87 - 5.11 MIL/uL   Hemoglobin 10.8 (L) 12.0 - 15.0 g/dL   HCT 33.2 (L) 36.0 - 46.0 %   MCV 94.9 78.0 - 100.0 fL   MCH 30.9 26.0 - 34.0 pg   MCHC 32.5 30.0 - 36.0 g/dL   RDW 12.6 11.5 - 15.5 %   Platelets 178 150 - 400 K/uL   Neutrophils Relative % 78 %   Neutro Abs 4.7 1.7 - 7.7 K/uL   Lymphocytes Relative 17 %   Lymphs Abs 1.0 0.7 - 4.0 K/uL   Monocytes Relative 5 %   Monocytes Absolute 0.3 0.1 - 1.0 K/uL   Eosinophils Relative 0 %   Eosinophils Absolute 0.0 0.0 - 0.7 K/uL   Basophils Relative 0 %   Basophils Absolute 0.0 0.0 - 0.1 K/uL  Urinalysis, Routine w reflex microscopic  Result Value Ref Range   Color, Urine YELLOW YELLOW   APPearance TURBID (A) CLEAR   Specific Gravity, Urine 1.010 1.005 - 1.030   pH 6.0 5.0 - 8.0   Glucose, UA NEGATIVE NEGATIVE mg/dL   Hgb urine dipstick MODERATE (A) NEGATIVE   Bilirubin Urine NEGATIVE NEGATIVE   Ketones, ur 15 (A) NEGATIVE mg/dL   Protein, ur TRACE (A) NEGATIVE mg/dL   Nitrite NEGATIVE NEGATIVE   Leukocytes, UA LARGE (A) NEGATIVE  Lactic acid, plasma  Result Value Ref Range   Lactic Acid, Venous 1.2 0.5 - 1.9 mmol/L  Urine microscopic-add on  Result Value Ref Range   Squamous Epithelial / LPF 0-5 (A) NONE SEEN   WBC, UA TOO NUMEROUS TO COUNT 0 - 5 WBC/hpf   RBC / HPF  6-30 0 - 5 RBC/hpf   Bacteria, UA MANY (A) NONE SEEN  Protime-INR  Result Value Ref Range   Prothrombin Time 19.1 (H) 11.4 - 15.2 seconds   INR 1.59    Dg Chest 2 View Result Date: 02/06/2016 CLINICAL DATA:  Nausea, vomiting EXAM: CHEST  2 VIEW COMPARISON:  08/04/2014 FINDINGS: There is right lower lobe airspace disease. There is no pleural effusion or pneumothorax. There is stable cardiomegaly. There is a dual lead cardiac pacemaker. There is calcification of the cardiac apex consistent with prior infarct with possible aneurysm of the cardiac apex. The osseous structures are unremarkable. IMPRESSION: Right lower lobe pneumonia. Followup PA and lateral chest X-ray is recommended in 3-4 weeks following trial of antibiotic therapy to ensure resolution and exclude underlying malignancy. Electronically Signed   By: Kathreen Devoid   On: 02/06/2016 12:58   Ct Abdomen Pelvis W Contrast Result Date: 02/06/2016 CLINICAL DATA:  Nausea today. No vomiting. Recently increased Coumadin dose. History of nephrolithiasis. EXAM: CT ABDOMEN AND PELVIS WITH CONTRAST TECHNIQUE: Multidetector CT imaging of the abdomen and pelvis was performed using the standard protocol following bolus administration of intravenous contrast. CONTRAST:  39mL ISOVUE-300 IOPAMIDOL (ISOVUE-300) INJECTION 61%, 45mL ISOVUE-300 IOPAMIDOL (ISOVUE-300) INJECTION 61% COMPARISON:  CT abdomen and pelvis November 13, 2015 FINDINGS: LOWER CHEST:  Improved aeration of LEFT lung base. Similar RIGHT lung base atelectasis. Debris within the RIGHT lower lobe bronchi suspected. Moderate cardiomegaly. Severe coronary artery calcifications. Old LEFT ventricle calcified aneurysm. Pacemaker wires in place. No pericardial effusion. HEPATOBILIARY: Liver and gallbladder are normal. PANCREAS: Normal. SPLEEN: Sub cm benign-appearing cyst versus lymphangioma. ADRENALS/URINARY TRACT: Kidneys are orthotopic, demonstrating symmetric enhancement. Two RIGHT lower pole  nephrolithiasis measure up to 4 mm, punctate RIGHT upper pole nephrolithiasis. No LEFT nephrolithiasis. Bilateral renal cysts, dominant within LEFT interpolar kidney measuring 2.8 cm. Too small to characterize hypodensities in the kidneys bilaterally. No solid renal masses. The unopacified ureters are normal in course and caliber with urothelial enhancement, RIGHT greater than LEFT. Delayed imaging through the kidneys demonstrates symmetric prompt contrast excretion within the proximal urinary collecting system. Urinary bladder is partially distended and unremarkable. Bilateral benign adrenal adenomas. STOMACH/BOWEL: Small hiatal hernia. Small and large bowel are normal in course and caliber without inflammatory changes. Severe sigmoid diverticulosis. Normal appendix. VASCULAR/LYMPHATIC: Aortoiliac vessels are normal in course and caliber, severe atherosclerosis. No lymphadenopathy by CT size criteria. REPRODUCTIVE: Status post hysterectomy. OTHER: No intraperitoneal free fluid or free air. MUSCULOSKELETAL: Nonacute. Anterior abdominal wall ligamentous laxity. Osteopenia. Severe degenerative change of lumbar spine with grade 2 L5-S1 anterolisthesis. IMPRESSION: RIGHT greater than LEFT urothelial enhancement compatible with urinary tract infection, no pyelonephritis. Nonobstructing RIGHT lower pole nephrolithiasis. Suspected RIGHT lower lobe bronchial debris suggesting aspiration with RIGHT lung base atelectasis. Severe atherosclerosis. Electronically Signed   By: Elon Alas M.D.   On: 02/06/2016 15:32    1640:  O2 Sat 91% R/A on arrival, will desat to 83% R/A with movement. O2 2L N/C applied with increasing O2 Sat to 99%. CAP on XR, +UTI (UC pending). IV rocephin and doxycycline given. Dx and testing d/w pt and family.  Questions answered.  Verb understanding, agreeable to admit. T/C to [facility]. [place] Comment, case discussed, including:  HPI, pertinent PM/SHx, VS/PE, dx testing, ED course and treatment:  Agreeable  to admit, requests to come to ED for evaluation.    Final Clinical Impressions(s) / ED Diagnoses   Final diagnoses:  Community acquired pneumonia, unspecified laterality  Urinary tract infection without hematuria, site unspecified  Nausea and vomiting in adult    New Prescriptions New Prescriptions   No medications on file     Francine Graven, DO 02/09/16 2000

## 2016-02-06 NOTE — ED Triage Notes (Signed)
Pt states pcp increased coumadin Monday. Has been nauseated all day today. No vomiting. Denies pain. Pt is a/o. Mm wet. Mild gen weakness noted.

## 2016-02-06 NOTE — Progress Notes (Signed)
[facility] for Warfarin (home med) Indication: LV Thrombus  Allergies  Allergen Reactions  . Codeine Nausea And Vomiting   Patient Measurements: Height: 4\' 11"  (149.9 cm) Weight: 150 lb (68 kg) IBW/kg (Calculated) : 43.2  Vital Signs: Temp: 98.2 F (36.8 C) (11/15 1141) Temp Source: Oral (11/15 1141) BP: 122/53 (11/15 1630) Pulse Rate: 49 (11/15 1630)  Labs:  Recent Labs  02/04/16 1132 02/06/16 1247  HGB  --  10.8*  HCT  --  33.2*  PLT  --  178  LABPROT  --  19.1*  INR 1.5 1.59  CREATININE  --  1.08*  TROPONINI  --  <0.03   Estimated Creatinine Clearance: 30.2 mL/min (by C-G formula based on SCr of 1.08 mg/dL (H)).  Medical History: Past Medical History:  Diagnosis Date  . Arthritis    severe lower back pain  . CAD (coronary artery disease)    anterior MI in 2006 with Cypher DES to LAD  . Cardiomyopathy   . CHF (congestive heart failure) ([place])   . CKD (chronic kidney disease) stage 3, GFR 30-59 ml/min    Stage III-stage IV  . HTN (hypertension)   . LV (left ventricular) mural thrombus    on coumadin   . MI, old   . Nephrolithiasis   . OA (osteoarthritis) of hip   . On home oxygen therapy    at night  . Osteoporosis   . Other specified forms of chronic ischemic heart disease    echo (1/10) with EF 25-30%, mildly dilated LV, periapical LV aneueysm, calcified LV thombus, moderate MR. Medtronic ICD set to VVI.   Marland Kitchen Pure hypercholesterolemia    Medications:   (Not in a hospital admission)  Home meds reviewed.  Assessment: Okay for Protocol.  INR recently low in clinic and "boost" doses ordered on 11/13 and 11/14.  INR remains low this admission.  Goal of Therapy:  INR 2-3   Plan:  Warfarin 2.5mg  PO x1. Monitor for signs and symptoms of bleeding.   Pricilla Larsson 02/06/2016,6:26 PM

## 2016-02-06 NOTE — ED Notes (Signed)
Sat 91% ra upon arrival. No sob or resp distress noted. 02 2l [place] applied

## 2016-02-06 NOTE — ED Notes (Signed)
Pt taken to XRAY

## 2016-02-06 NOTE — ED Notes (Signed)
Pt placed back on 2L [place] 02

## 2016-02-06 NOTE — H&P (Signed)
History and Physical    [name] [id number] DOB: Nov 26, 1925 [name]: 02/06/2016  Referring MD/NP/PA: Francine Graven, DO PCP: [name], MD  Outpatient Specialists: None  Patient coming from: Home  Chief Complaint: Nausea and vomiting  HPI: [name] is a 82 y.o. female with medical history significant of CAD, CHF, CKD stage III, and HTN, LV thrombus on coumadin, presents to the ED with complaints of nausea and vomiting that onset earlier today. She states that she had fallen earlier today, but she didn't lose conciousness. She has been feeling very weak lately. She typically ambulates with a walker. She has not been eating much recently. It is noted that she gets a lot of UTIs. It is also noted that she lives alone. She is also on oxygen at home that she uses every night. In the ED, she was started on Zofran. BUN 30, creatinine 1.08, ALT 12, troponin I  <0.03, Hgb 10.8, lactic acid 1.2, and UA is indicative of infection. CXR shows right lower lung PNA. EKG shows sinus rhythm. She was started on IV rocephin, IV doxycycline, and was admitted for treatment of UTI and PNA.  Review of Systems: As per HPI otherwise 10 point review of systems negative.   Past Medical History:  Diagnosis Date  . Arthritis    severe lower back pain  . CAD (coronary artery disease)    anterior MI in 2006 with Cypher DES to LAD  . Cardiomyopathy   . CHF (congestive heart failure) ([place])   . CKD (chronic kidney disease) stage 3, GFR 30-59 ml/min    Stage III-stage IV  . HTN (hypertension)   . LV (left ventricular) mural thrombus    on coumadin   . MI, old   . Nephrolithiasis   . OA (osteoarthritis) of hip   . On home oxygen therapy    at night  . Osteoporosis   . Other specified forms of chronic ischemic heart disease    echo (1/10) with EF 25-30%, mildly dilated LV, periapical LV aneueysm, calcified LV thombus, moderate MR. Medtronic ICD set to VVI.   Marland Kitchen Pure hypercholesterolemia     Past  Surgical History:  Procedure Laterality Date  . AICD implantation     ICD-Medtronic. Remote-yes   . CARDIAC CATHETERIZATION    . CORONARY ANGIOPLASTY    . CORONARY ANGIOPLASTY WITH STENT PLACEMENT    . VESICOVAGINAL FISTULA CLOSURE W/ TAH  1978     reports that she has never smoked. She has never used smokeless tobacco. She reports that she does not drink alcohol or use drugs.  Allergies  Allergen Reactions  . Codeine Nausea And Vomiting    Family History  Problem Relation Age of Onset  . Stroke Father   . Breast cancer Mother   . Stroke Brother   . Diabetes Brother      Prior to Admission medications   Medication Sig Start Date End Date Taking? Authorizing Provider  acetaminophen (TYLENOL) 325 MG tablet Take 325-650 mg by mouth every 6 (six) hours as needed for mild pain.    Yes Historical Provider, MD  atorvastatin (LIPITOR) 40 MG tablet Take 1 tablet (40 mg total) by mouth daily at 6 PM. 08/29/15  Yes Larey Dresser, MD  carvedilol (COREG) 25 MG tablet Take 1 Tablet by mouth 2 times a day 11/30/15  Yes Larey Dresser, MD  cholecalciferol (VITAMIN D) 1000 UNITS tablet Take 1,000 Units by mouth daily.     Yes Historical  Provider, MD  folic acid (FOLVITE) 1 MG tablet Take 1 mg by mouth daily.   Yes Historical Provider, MD  furosemide (LASIX) 40 MG tablet Take 40 mg (1 tablet) in the morning and 20 mg (1/2 tablet) in the afternoon 01/07/16  Yes Shaune Pascal Bensimhon, MD  gabapentin (NEURONTIN) 100 MG capsule 1 capsule 3 (three) times daily. 01/14/16  Yes Historical Provider, MD  losartan (COZAAR) 50 MG tablet Take 1 Tablet by mouth once daily 01/07/16  Yes Shirley Friar, PA-C  pantoprazole (PROTONIX) 40 MG tablet Take 1 Tablet by mouth once daily 01/15/16  Yes Larey Dresser, MD  polyethylene glycol [facility] / GLYCOLAX) packet Take 17 g by mouth daily. 04/28/15  Yes Domenic Polite, MD  saxagliptin HCl (ONGLYZA) 2.5 MG TABS tablet Take 2.5 mg by mouth daily.   Yes Historical  Provider, MD  sertraline (ZOLOFT) 50 MG tablet Take 50 mg by mouth daily.    Yes Historical Provider, MD  spironolactone (ALDACTONE) 25 MG tablet Take 1 tablet (25 mg total) by mouth daily. 07/16/15  Yes Larey Dresser, MD  traMADol (ULTRAM) 50 MG tablet Take 1 tablet (50 mg total) by mouth every 6 (six) hours as needed for moderate pain. 09/12/15  Yes Julianne Rice, MD  warfarin (COUMADIN) 2.5 MG tablet TAKE AS DIRECTED BY ANTICOAGULATION CLINIC 06/08/15  Yes Evans Lance, MD  nitroGLYCERIN (NITROSTAT) 0.4 MG SL tablet Place 1 tablet (0.4 mg total) under the tongue every 5 (five) minutes as needed for chest pain. 02/12/15   Evans Lance, MD    Physical Exam: Vitals:   02/06/16 1200 02/06/16 1230 02/06/16 1338 02/06/16 1400  BP:  145/60 151/66 133/79  Pulse:  65 66 74  Resp:   19   Temp:      TempSrc:      SpO2: 98% 96% 95% 99%  Weight:      Height:          Constitutional: NAD, calm, comfortable Vitals:   02/06/16 1200 02/06/16 1230 02/06/16 1338 02/06/16 1400  BP:  145/60 151/66 133/79  Pulse:  65 66 74  Resp:   19   Temp:      TempSrc:      SpO2: 98% 96% 95% 99%  Weight:      Height:       Eyes: PERRL, lids and conjunctivae normal ENMT: Mucous membranes are moist. Posterior pharynx clear of any exudate or lesions.Normal dentition.  Neck: normal, supple, no masses, no thyromegaly Respiratory: clear to auscultation bilaterally, no wheezing, no crackles. Normal respiratory effort. No accessory muscle use.  Cardiovascular: Regular rate and rhythm, no murmurs / rubs / gallops. No extremity edema. 2+ pedal pulses. No carotid bruits.  Abdomen: no tenderness, no masses palpated. No hepatosplenomegaly. Bowel sounds positive.  Musculoskeletal: no clubbing / cyanosis. No joint deformity upper and lower extremities. Good ROM, no contractures. Normal muscle tone.  Skin: no rashes, lesions, ulcers. No induration Neurologic: CN 2-12 grossly intact. Sensation intact, DTR normal.  Strength 5/5 in all 4.  Psychiatric: Normal judgment and insight. Alert and oriented x 3. Normal mood.    Labs on Admission: I have personally reviewed following labs and imaging studies  CBC:  Recent Labs Lab 02/06/16 1247  WBC 6.0  NEUTROABS 4.7  HGB 10.8*  HCT 33.2*  MCV 94.9  PLT 0000000   Basic Metabolic Panel:  Recent Labs Lab 02/06/16 1247  NA 136  K 3.9  CL 102  CO2 27  GLUCOSE 107*  BUN 30*  CREATININE 1.08*  CALCIUM 9.2   GFR: Estimated Creatinine Clearance: 30.2 mL/min (by C-G formula based on SCr of 1.08 mg/dL (H)). Liver Function Tests:  Recent Labs Lab 02/06/16 1247  AST 21  ALT 12*  ALKPHOS 51  BILITOT 1.0  PROT 7.0  ALBUMIN 3.7    Recent Labs Lab 02/06/16 1247  LIPASE 23   Coagulation Profile:  Recent Labs Lab 02/04/16 1132 02/06/16 1247  INR 1.5 1.59   Cardiac Enzymes:  Recent Labs Lab 02/06/16 1247  TROPONINI <0.03   Urine analysis:    Component Value Date/Time   COLORURINE YELLOW 02/06/2016 1249   APPEARANCEUR TURBID (A) 02/06/2016 1249   LABSPEC 1.010 02/06/2016 1249   PHURINE 6.0 02/06/2016 1249   GLUCOSEU NEGATIVE 02/06/2016 1249   HGBUR MODERATE (A) 02/06/2016 1249   BILIRUBINUR NEGATIVE 02/06/2016 1249   KETONESUR 15 (A) 02/06/2016 1249   PROTEINUR TRACE (A) 02/06/2016 1249   UROBILINOGEN 4.0 (H) 08/28/2014 1119   NITRITE NEGATIVE 02/06/2016 1249   LEUKOCYTESUR LARGE (A) 02/06/2016 1249    Radiological Exams on Admission: Dg Chest 2 View  Result Date: 02/06/2016 CLINICAL DATA:  Nausea, vomiting EXAM: CHEST  2 VIEW COMPARISON:  08/04/2014 FINDINGS: There is right lower lobe airspace disease. There is no pleural effusion or pneumothorax. There is stable cardiomegaly. There is a dual lead cardiac pacemaker. There is calcification of the cardiac apex consistent with prior infarct with possible aneurysm of the cardiac apex. The osseous structures are unremarkable. IMPRESSION: Right lower lobe pneumonia. Followup PA  and lateral chest X-ray is recommended in 3-4 weeks following trial of antibiotic therapy to ensure resolution and exclude underlying malignancy. Electronically Signed   By: Kathreen Devoid   On: 02/06/2016 12:58   Ct Abdomen Pelvis W Contrast  Result Date: 02/06/2016 CLINICAL DATA:  Nausea today. No vomiting. Recently increased Coumadin dose. History of nephrolithiasis. EXAM: CT ABDOMEN AND PELVIS WITH CONTRAST TECHNIQUE: Multidetector CT imaging of the abdomen and pelvis was performed using the standard protocol following bolus administration of intravenous contrast. CONTRAST:  34mL ISOVUE-300 IOPAMIDOL (ISOVUE-300) INJECTION 61%, 69mL ISOVUE-300 IOPAMIDOL (ISOVUE-300) INJECTION 61% COMPARISON:  CT abdomen and pelvis November 13, 2015 FINDINGS: LOWER CHEST: Improved aeration of LEFT lung base. Similar RIGHT lung base atelectasis. Debris within the RIGHT lower lobe bronchi suspected. Moderate cardiomegaly. Severe coronary artery calcifications. Old LEFT ventricle calcified aneurysm. Pacemaker wires in place. No pericardial effusion. HEPATOBILIARY: Liver and gallbladder are normal. PANCREAS: Normal. SPLEEN: Sub cm benign-appearing cyst versus lymphangioma. ADRENALS/URINARY TRACT: Kidneys are orthotopic, demonstrating symmetric enhancement. Two RIGHT lower pole nephrolithiasis measure up to 4 mm, punctate RIGHT upper pole nephrolithiasis. No LEFT nephrolithiasis. Bilateral renal cysts, dominant within LEFT interpolar kidney measuring 2.8 cm. Too small to characterize hypodensities in the kidneys bilaterally. No solid renal masses. The unopacified ureters are normal in course and caliber with urothelial enhancement, RIGHT greater than LEFT. Delayed imaging through the kidneys demonstrates symmetric prompt contrast excretion within the proximal urinary collecting system. Urinary bladder is partially distended and unremarkable. Bilateral benign adrenal adenomas. STOMACH/BOWEL: Small hiatal hernia. Small and large bowel  are normal in course and caliber without inflammatory changes. Severe sigmoid diverticulosis. Normal appendix. VASCULAR/LYMPHATIC: Aortoiliac vessels are normal in course and caliber, severe atherosclerosis. No lymphadenopathy by CT size criteria. REPRODUCTIVE: Status post hysterectomy. OTHER: No intraperitoneal free fluid or free air. MUSCULOSKELETAL: Nonacute. Anterior abdominal wall ligamentous laxity. Osteopenia. Severe degenerative change of lumbar spine with grade 2 L5-S1 anterolisthesis. IMPRESSION: RIGHT  greater than LEFT urothelial enhancement compatible with urinary tract infection, no pyelonephritis. Nonobstructing RIGHT lower pole nephrolithiasis. Suspected RIGHT lower lobe bronchial debris suggesting aspiration with RIGHT lung base atelectasis. Severe atherosclerosis. Electronically Signed   By: Elon Alas M.D.   On: 02/06/2016 15:32    EKG: Independently reviewed. Sinus rhythm  Assessment/Plan Principal Problem:   Lower urinary tract infectious disease Active Problems:   HYPERCHOLESTEROLEMIA   Hyperlipidemia   Long term current use of anticoagulant therapy   Weakness generalized    1. Generalized weakness. This is likely due to dehydration from vomiting, along with UTI and CAP. WBC wnl. UA is indicative of infection. Follow-up urine culture. Will continue IV abx and IV fluids. 2. CAP. WBC is wnl. CXR shows evidence of right lower lung PNA. Continue IV rocephin and IV doxycycline. Continue pulmonary hygiene. 3. Normocytic anemia. H&H are low. There is no evidence of bleed at this time. Recheck CBC in the a.m. 4. Chronic systolic CHF. Most recent ECHO in April of 2016 shows EF of 25 -30 %. Patient is not a candidate for ACE inhibitors due to renal dysfunction. Will hold diuretics, and will give gentle IVF. She will need to resume her diuretics at some point.  Monitor intake and output. 5. CKD stage III. BUN 30 and creatinine 1.08, both are slightly elevated. Monitor input and  output. 6. HLD. Continue statins. 7. HTN. Currently stable. Continue Losartan. Monitor carefully. 8. Mural thrombus:  Continue coumadin per pharmacy dosing.   DVT prophylaxis: Coumadin Code Status: Full Family Communication: Daughter at bedside Disposition Plan: Discharge once improved Consults called: None Admission status: Admit to inpatient   Orvan Falconer, MD    FACP [facility] If 7PM-7AM, please contact night-coverage [email or web address] Password [email or web address]  02/06/2016, 4:20 PM   By signing my name below, I, Hilbert Odor, attest that this documentation has been prepared under the direction and in the presence of Orvan Falconer, MD. Electronically signed: Hilbert Odor, Scribe.  02/06/16, 4:43 PM

## 2016-02-06 NOTE — ED Notes (Signed)
Given meal tray.

## 2016-02-06 NOTE — ED Notes (Signed)
Pt to CT

## 2016-02-06 NOTE — ED Notes (Signed)
Lab in to redraw first lactic acid

## 2016-02-07 DIAGNOSIS — N390 Urinary tract infection, site not specified: Secondary | ICD-10-CM

## 2016-02-07 LAB — COMPREHENSIVE METABOLIC PANEL
ALK PHOS: 46 U/L (ref 38–126)
ALT: 12 U/L — ABNORMAL LOW (ref 14–54)
ANION GAP: 6 (ref 5–15)
AST: 23 U/L (ref 15–41)
Albumin: 3.2 g/dL — ABNORMAL LOW (ref 3.5–5.0)
BUN: 28 mg/dL — ABNORMAL HIGH (ref 6–20)
CALCIUM: 8.7 mg/dL — AB (ref 8.9–10.3)
CO2: 28 mmol/L (ref 22–32)
Chloride: 103 mmol/L (ref 101–111)
Creatinine, Ser: 1.19 mg/dL — ABNORMAL HIGH (ref 0.44–1.00)
GFR calc non Af Amer: 40 mL/min — ABNORMAL LOW (ref >60)
GFR, EST AFRICAN AMERICAN: 46 mL/min — AB (ref >60)
Glucose, Bld: 128 mg/dL — ABNORMAL HIGH (ref 65–99)
Potassium: 3.8 mmol/L (ref 3.5–5.1)
SODIUM: 137 mmol/L (ref 135–145)
TOTAL PROTEIN: 6.2 g/dL — AB (ref 6.5–8.1)
Total Bilirubin: 0.5 mg/dL (ref 0.3–1.2)

## 2016-02-07 LAB — CBC
HCT: 30.8 % — ABNORMAL LOW (ref 36.0–46.0)
HEMOGLOBIN: 10 g/dL — AB (ref 12.0–15.0)
MCH: 31 pg (ref 26.0–34.0)
MCHC: 32.5 g/dL (ref 30.0–36.0)
MCV: 95.4 fL (ref 78.0–100.0)
Platelets: 178 10*3/uL (ref 150–400)
RBC: 3.23 MIL/uL — AB (ref 3.87–5.11)
RDW: 12.6 % (ref 11.5–15.5)
WBC: 7.5 10*3/uL (ref 4.0–10.5)

## 2016-02-07 LAB — GLUCOSE, CAPILLARY
GLUCOSE-CAPILLARY: 147 mg/dL — AB (ref 65–99)
Glucose-Capillary: 119 mg/dL — ABNORMAL HIGH (ref 65–99)
Glucose-Capillary: 153 mg/dL — ABNORMAL HIGH (ref 65–99)
Glucose-Capillary: 149 mg/dL — ABNORMAL HIGH (ref 65–99)

## 2016-02-07 LAB — BRAIN NATRIURETIC PEPTIDE: B NATRIURETIC PEPTIDE 5: 633 pg/mL — AB (ref 0.0–100.0)

## 2016-02-07 LAB — PROTIME-INR
INR: 1.75
Prothrombin Time: 20.7 seconds — ABNORMAL HIGH (ref 11.4–15.2)

## 2016-02-07 MED ORDER — ALBUTEROL SULFATE (2.5 MG/3ML) 0.083% IN NEBU
2.5000 mg | INHALATION_SOLUTION | RESPIRATORY_TRACT | Status: DC | PRN
  Administered 2016-02-07: 2.5 mg via RESPIRATORY_TRACT
  Filled 2016-02-07: qty 3

## 2016-02-07 MED ORDER — PRO-STAT SUGAR FREE PO LIQD
30.0000 mL | Freq: Two times a day (BID) | ORAL | Status: DC
  Administered 2016-02-07 – 2016-02-08 (×3): 30 mL via ORAL
  Filled 2016-02-07 (×3): qty 30

## 2016-02-07 MED ORDER — FUROSEMIDE 10 MG/ML IJ SOLN
40.0000 mg | Freq: Once | INTRAMUSCULAR | Status: AC
  Administered 2016-02-07: 40 mg via INTRAVENOUS
  Filled 2016-02-07: qty 4

## 2016-02-07 MED ORDER — WARFARIN SODIUM 5 MG PO TABS
2.5000 mg | ORAL_TABLET | Freq: Once | ORAL | Status: AC
  Administered 2016-02-07: 2.5 mg via ORAL
  Filled 2016-02-07: qty 1

## 2016-02-07 NOTE — Evaluation (Signed)
Clinical/Bedside Swallow Evaluation Patient Details  Name: [name] MRN: [id number] Date of Birth: [birth year]  Today's Date: 02/07/2016 Time: SLP Start Time (ACUTE ONLY): 1930 SLP Stop Time (ACUTE ONLY): 2002 SLP Time Calculation (min) (ACUTE ONLY): 32 min  Past Medical History:  Past Medical History:  Diagnosis Date  . Arthritis    severe lower back pain  . CAD (coronary artery disease)    anterior MI in 2006 with Cypher DES to LAD  . Cardiomyopathy   . CHF (congestive heart failure) ([place])   . CKD (chronic kidney disease) stage 3, GFR 30-59 ml/min    Stage III-stage IV  . HTN (hypertension)   . LV (left ventricular) mural thrombus    on coumadin   . MI, old   . Nephrolithiasis   . OA (osteoarthritis) of hip   . On home oxygen therapy    at night  . Osteoporosis   . Other specified forms of chronic ischemic heart disease    echo (1/10) with EF 25-30%, mildly dilated LV, periapical LV aneueysm, calcified LV thombus, moderate MR. Medtronic ICD set to VVI.   Marland Kitchen Pure hypercholesterolemia    Past Surgical History:  Past Surgical History:  Procedure Laterality Date  . AICD implantation     ICD-Medtronic. Remote-yes   . CARDIAC CATHETERIZATION    . CORONARY ANGIOPLASTY    . CORONARY ANGIOPLASTY WITH STENT PLACEMENT    . VESICOVAGINAL FISTULA CLOSURE W/ TAH  1978   HPI:  82 y.o. female with medical history significant of CAD, CHF, CKD stage III, and HTN, LV thrombus on coumadin, presents to the ED with complaints of nausea and vomiting that onset earlier today. She states that she had fallen earlier today, but she didn't lose conciousness. She has been feeling very weak lately. She typically ambulates with a walker. She has not been eating much recently. It is noted that she gets a lot of UTIs. It is also noted that she lives alone. She is also on oxygen at home that she uses every night. In the ED, she was started on Zofran. BUN 30, creatinine 1.08, ALT 12, troponin I  <0.03,  Hgb 10.8, lactic acid 1.2, and UA is indicative of infection. CXR shows right lower lung PNA. EKG shows sinus rhythm. She was started on IV rocephin, IV doxycycline, and was admitted for treatment of UTI and PNA.   Assessment / Plan / Recommendation Clinical Impression  Pt seen at bedside from clinical swallow evaluation. Pt requested assist to bedside commode (completed with help from RN as well). Pt complains of back pain with movement, but once settled the pain subsides. She tells me that she does not have difficulty swallowing, but then says that she occasionally coughs on solid foods and takes a sip of liquid which makes it worse. She cannot remember when this last occurred. She also states that she "gets sick" to her stomach a few times a month. She denies h/o esophageal dilation or GI involvement. Oral motor examination is WFL. Pt assessed with ice chips, water, applesauce, and graham crackers (self fed) and showed no overt signs of aspiration/penetration. Noted that chest x-ray shows PNA, but that pt came to ED after emesis episodes. It is possible that pt aspirated during emesis. SLP will sign off and continue diet as ordered. If MD is concerned about silent aspiration, can complete MBSS if desired.     Aspiration Risk  Mild aspiration risk    Diet Recommendation Regular;Thin liquid  Liquid Administration via: Cup;Straw Medication Administration: Whole meds with liquid Supervision: Patient able to self feed Compensations: Follow solids with liquid Postural Changes: Seated upright at 90 degrees;Remain upright for at least 30 minutes after po intake    Other  Recommendations Oral Care Recommendations: Oral care BID Other Recommendations: Clarify dietary restrictions   Follow up Recommendations None      Frequency and Duration  N/A         Prognosis        Swallow Study   General Date of Onset: 02/06/16 HPI: 82 y.o. female with medical history significant of CAD, CHF, CKD  stage III, and HTN, LV thrombus on coumadin, presents to the ED with complaints of nausea and vomiting that onset earlier today. She states that she had fallen earlier today, but she didn't lose conciousness. She has been feeling very weak lately. She typically ambulates with a walker. She has not been eating much recently. It is noted that she gets a lot of UTIs. It is also noted that she lives alone. She is also on oxygen at home that she uses every night. In the ED, she was started on Zofran. BUN 30, creatinine 1.08, ALT 12, troponin I  <0.03, Hgb 10.8, lactic acid 1.2, and UA is indicative of infection. CXR shows right lower lung PNA. EKG shows sinus rhythm. She was started on IV rocephin, IV doxycycline, and was admitted for treatment of UTI and PNA. Type of Study: Bedside Swallow Evaluation Previous Swallow Assessment: None on record Diet Prior to this Study: Regular;Thin liquids Temperature Spikes Noted: No Respiratory Status: Nasal cannula History of Recent Intubation: No Behavior/Cognition: Alert;Cooperative;Pleasant mood Oral Cavity Assessment: Within Functional Limits Oral Care Completed by SLP: Yes Oral Cavity - Dentition: Adequate natural dentition;Missing dentition Vision: Functional for self-feeding Self-Feeding Abilities: Able to feed self;Needs set up Patient Positioning: Upright in bed Baseline Vocal Quality: Normal Volitional Cough: Strong Volitional Swallow: Able to elicit    Oral/Motor/Sensory Function Overall Oral Motor/Sensory Function: Within functional limits   Ice Chips Ice chips: Within functional limits Presentation: Spoon   Thin Liquid Thin Liquid: Within functional limits Presentation: Cup;Self Fed;Straw    Nectar Thick Nectar Thick Liquid: Not tested   Honey Thick Honey Thick Liquid: Not tested   Puree Puree: Within functional limits Presentation: Spoon   Solid   Thank you,  [name], CCC-SLP [phone]    Solid: Within functional  limits Presentation: Self Fed Other Comments: Mild lingual residue which pt cleared with sip liquid        [name] 02/07/2016,8:11 PM

## 2016-02-07 NOTE — Clinical Social Work Note (Signed)
Clinical Social Work Assessment  Patient Details  Name: [name] MRN: [id number] Date of Birth: 1925/03/28  Date of referral:  02/07/16               Reason for consult:  Discharge Planning, Facility Placement                Permission sought to share information with:    Permission granted to share information::     Name::        Agency::     Relationship::     Contact Information:     Housing/Transportation Living arrangements for the past 2 months:  [place] of Information:  Patient Patient Interpreter Needed:  None Criminal Activity/Legal Involvement Pertinent to Current Situation/Hospitalization:  No - Comment as needed Significant Relationships:  Adult Children, Other Family Members Lives with:  Self Do you feel safe going back to the place where you live?  Yes Need for family participation in patient care:  Yes (Comment)  Care giving concerns: None identified by patient.    Social Worker assessment / plan:  Patient lives alone, uses a rolling walker, is independent in feeding herself and bathing. Patient stated that her daughter or son-in-law bring her dinner each evening. Patient was tearful but agreeable to SNF. She stated that she wanted to go to [facility], [facility] or [facility].  She stated that she would speak with her daughter about it this evening. She gave permission for CSW to fax clinicals to facilities.    Employment status:  Retired Nurse, adult PT Recommendations:  [place] / Referral to community resources:  [place]  Patient/Family's Response to care:  Patient is agreeable to go to SNF for short term rehab.  Patient/Family's Understanding of and Emotional Response to Diagnosis, Current Treatment, and Prognosis: Patient understands her diagnosis, treatment and prognosis.   Emotional Assessment Appearance:  Appears stated  age Attitude/Demeanor/Rapport:   (Cooperative/pleasant) Affect (typically observed):  Accepting, Calm Orientation:  Oriented to Self, Oriented to Place, Oriented to  Time, Oriented to Situation Alcohol / Substance use:  Not Applicable Psych involvement (Current and /or in the community):  No (Comment)  Discharge Needs  Concerns to be addressed:  Discharge Planning Concerns Readmission within the last 30 days:  No Current discharge risk:  Lives alone Barriers to Discharge:  No Barriers Identified   Ihor Gully, LCSW 02/07/2016, 11:23 AM

## 2016-02-07 NOTE — Care Management Note (Signed)
Case Management Note  Patient Details  Name: [name] MRN: [id number] Date of Birth: 19-Dec-1925  Subjective/Objective:                  Pt admitted with UTI. She is from home, lives alone. PT has recommended SNF. Pt is agreeable and CSW will make arrangements for SNF placement.   Action/Plan: No CM needs anticipated.   Expected Discharge Date:    02/08/2016              Expected Discharge Plan:  [place]  In-House Referral:  Clinical Social Work  Discharge planning Services  CM Consult  Post Acute Care Choice:  NA Choice offered to:    NA  Status of Service:  Completed, signed off  Sherald Barge, RN 02/07/2016, 2:23 PM

## 2016-02-07 NOTE — Clinical Social Work Placement (Signed)
   CLINICAL SOCIAL WORK PLACEMENT  NOTE  Date:  02/07/2016  Patient Details  Name: [name] MRN: [id number] Date of Birth: Dec 06, 1925  Clinical Social Work is seeking post-discharge placement for this patient at the [place] level of care (*CSW will initial, date and re-position this form in  chart as items are completed):  Yes   Patient/family provided with [place] Work Department's list of facilities offering this level of care within the geographic area requested by the patient (or if unable, by the patient's family).  Yes   Patient/family informed of their freedom to choose among providers that offer the needed level of care, that participate in Medicare, Medicaid or managed care program needed by the patient, have an available bed and are willing to accept the patient.  Yes   Patient/family informed of [facility]'s ownership interest in [facility] and [facility], as well as of the fact that they are under no obligation to receive care at these facilities.  PASRR submitted to EDS on 02/07/16     PASRR number received on 02/07/16     Existing PASRR number confirmed on       FL2 transmitted to all facilities in geographic area requested by pt/family on       FL2 transmitted to all facilities within larger geographic area on       Patient informed that his/her managed care company has contracts with or will negotiate with certain facilities, including the following:            Patient/family informed of bed offers received.  Patient chooses bed at       Physician recommends and patient chooses bed at      Patient to be transferred to   on  .  Patient to be transferred to facility by       Patient family notified on   of transfer.  Name of family member notified:        PHYSICIAN       Additional Comment:    _______________________________________________ Ihor Gully, LCSW 02/07/2016, 11:28 AM

## 2016-02-07 NOTE — Progress Notes (Signed)
[place] for Warfarin (home med) Indication: LV Thrombus  Allergies  Allergen Reactions  . Codeine Nausea And Vomiting   Patient Measurements: Height: 4\' 11"  (149.9 cm) Weight: 143 lb 15.4 oz (65.3 kg) IBW/kg (Calculated) : 43.2  Vital Signs: Temp: 98.7 F (37.1 C) (11/16 1510) Temp Source: Oral (11/16 1510) BP: 153/55 (11/16 1510) Pulse Rate: 61 (11/16 1510)  Labs:  Recent Labs  02/06/16 1247 02/07/16 0703  HGB 10.8* 10.0*  HCT 33.2* 30.8*  PLT 178 178  LABPROT 19.1* 20.7*  INR 1.59 1.75  CREATININE 1.08* 1.19*  TROPONINI <0.03  --    Estimated Creatinine Clearance: 26.8 mL/min (by C-G formula based on SCr of 1.19 mg/dL (H)).  Medical History: Past Medical History:  Diagnosis Date  . Arthritis    severe lower back pain  . CAD (coronary artery disease)    anterior MI in 2006 with Cypher DES to LAD  . Cardiomyopathy   . CHF (congestive heart failure) ([place])   . CKD (chronic kidney disease) stage 3, GFR 30-59 ml/min    Stage III-stage IV  . HTN (hypertension)   . LV (left ventricular) mural thrombus    on coumadin   . MI, old   . Nephrolithiasis   . OA (osteoarthritis) of hip   . On home oxygen therapy    at night  . Osteoporosis   . Other specified forms of chronic ischemic heart disease    echo (1/10) with EF 25-30%, mildly dilated LV, periapical LV aneueysm, calcified LV thombus, moderate MR. Medtronic ICD set to VVI.   Marland Kitchen Pure hypercholesterolemia    Medications:  Prescriptions Prior to Admission  Medication Sig Dispense Refill Last Dose  . acetaminophen (TYLENOL) 325 MG tablet Take 325-650 mg by mouth every 6 (six) hours as needed for mild pain.    Past Week at Unknown time  . atorvastatin (LIPITOR) 40 MG tablet Take 1 tablet (40 mg total) by mouth daily at 6 PM. 30 tablet 4 02/06/2016 at Unknown time  . carvedilol (COREG) 25 MG tablet Take 1 Tablet by mouth 2 times a day 180 tablet 3 02/06/2016 at 0800  .  cholecalciferol (VITAMIN D) 1000 UNITS tablet Take 1,000 Units by mouth daily.     02/06/2016 at Unknown time  . folic acid (FOLVITE) 1 MG tablet Take 1 mg by mouth daily.   02/06/2016 at Unknown time  . furosemide (LASIX) 40 MG tablet Take 40 mg (1 tablet) in the morning and 20 mg (1/2 tablet) in the afternoon 45 tablet 3 02/06/2016 at Unknown time  . gabapentin (NEURONTIN) 100 MG capsule 1 capsule 3 (three) times daily.   02/06/2016 at Unknown time  . losartan (COZAAR) 50 MG tablet Take 1 Tablet by mouth once daily 30 tablet 3 02/06/2016 at Unknown time  . pantoprazole (PROTONIX) 40 MG tablet Take 1 Tablet by mouth once daily 90 tablet 3 02/06/2016 at Unknown time  . polyethylene glycol (MIRALAX / GLYCOLAX) packet Take 17 g by mouth daily. 14 each 0 02/06/2016 at Unknown time  . saxagliptin HCl (ONGLYZA) 2.5 MG TABS tablet Take 2.5 mg by mouth daily.   02/06/2016 at Unknown time  . sertraline (ZOLOFT) 50 MG tablet Take 50 mg by mouth daily.    02/06/2016 at Unknown time  . spironolactone (ALDACTONE) 25 MG tablet Take 1 tablet (25 mg total) by mouth daily. 90 tablet 1 02/06/2016 at Unknown time  . traMADol (ULTRAM) 50 MG tablet Take 1  tablet (50 mg total) by mouth every 6 (six) hours as needed for moderate pain. 10 tablet 0 02/06/2016 at Unknown time  . warfarin (COUMADIN) 2.5 MG tablet TAKE AS DIRECTED BY ANTICOAGULATION CLINIC 30 tablet 3 02/05/2016 at 1800  . nitroGLYCERIN (NITROSTAT) 0.4 MG SL tablet Place 1 tablet (0.4 mg total) under the tongue every 5 (five) minutes as needed for chest pain. 25 tablet 3 unknown    Home meds reviewed.  Assessment: Okay for Protocol.  INR recently low in clinic and "boost" doses ordered on 11/13 and 11/14.  INR remains low this admission.  Goal of Therapy:  INR 2-3   Plan:  Repeat Warfarin 2.5mg  PO x1. Monitor for signs and symptoms of bleeding.   Biagio Quint R 02/07/2016,3:49 PM

## 2016-02-07 NOTE — Evaluation (Signed)
Physical Therapy Evaluation Patient Details Name: [name] MRN: [id number] DOB: [birth year] Today's Date: 02/07/2016   History of Present Illness  82 y.o. female with medical history significant of CAD, CHF, CKD stage III, and HTN, LV thrombus on coumadin, presents to the ED with complaints of nausea and vomiting that onset earlier today. She states that she had fallen earlier today, but she didn't lose conciousness. She has been feeling very weak lately. She typically ambulates with a walker. She has not been eating much recently. It is noted that she gets a lot of UTIs. It is also noted that she lives alone. She is also on oxygen at home that she uses every night. In the ED, she was started on Zofran. BUN 30, creatinine 1.08, ALT 12, troponin I  <0.03, Hgb 10.8, lactic acid 1.2, and UA is indicative of infection. CXR shows right lower lung PNA. EKG shows sinus rhythm. She was started on IV rocephin, IV doxycycline, and was admitted for treatment of UTI and PNA.  Clinical Impression  Pt received sitting up in the chair, and was agreeable to PT evaluation.  Pt lives at home alone, and uses her RW for household ambulation.  She states that she only gets out of the house 2x's/wk to go to church.  She is independent with dressing and bathing, however her dtr does the grocery shopping, and running errands for her.  During PT evaluation today, she is c/o increased back pain, and Becky, RN requested to get pt some pain medication, which she brought during PT session.  Pt requires increased time and assistance to perform sit<>Stand, and was then assisted into the bathroom with RW.  She requires increased time for toileting, as well as some assistance - this was a very effortful task for her.  She then ambulated 57ft with RW and min guard.  Distance was limited due to fatigue and pain.  At this point, she is a high risk for recurrent falls due to decreased gait speed, and 2 previous falls in the past 6 months.   She is recommended for SNF to improve strength, endurance and balance to reduce her risk of falls and optimize her return to PLOF.     Follow Up Recommendations SNF;Supervision/Assistance - 24 hour    Equipment Recommendations  None recommended by PT    Recommendations for Other Services       Precautions / Restrictions Precautions Precautions: Fall Precaution Comments: 2 falls - fell in June, and then fell Friday afternoon.  Pt states that she falls because she doesn't have any balance.  Restrictions Weight Bearing Restrictions: No      Mobility  Bed Mobility Overal bed mobility:  (Not observed due to pt being up in the chair at start of tx.  )                Transfers Overall transfer level: Needs assistance Equipment used: Rolling walker (2 wheeled) Transfers: Sit to/from Stand Sit to Stand: Min assist (Increased time due to increased back pain.  )         General transfer comment: Pt was assisted into the bathroom, she was able to slowly doff her undergarments, and performed peri-hygiene standing, she required assistance to get undergarments completely pulled up.   Ambulation/Gait Ambulation/Gait assistance: Min guard Ambulation Distance (Feet): 25 Feet Assistive device: Rolling walker (2 wheeled) Gait Pattern/deviations: Step-to pattern;Antalgic;Trunk flexed     General Gait Details: Pt demonstrates slowed cadence which is indicative of risk  for recurrent falls   Stairs            Wheelchair Mobility    Modified Rankin (Stroke Patients Only)       Balance Overall balance assessment: History of Falls;Needs assistance Sitting-balance support: Bilateral upper extremity supported;Feet supported Sitting balance-Leahy Scale: Fair     Standing balance support: Bilateral upper extremity supported Standing balance-Leahy Scale: Poor                               Pertinent Vitals/Pain Pain Assessment: 0-10 Pain Score: 10-Worst pain  ever Pain Location: mid back Pain Intervention(s): Patient requesting pain meds-RN notified;RN gave pain meds during session;Limited activity within patient's tolerance;Monitored during session;Repositioned    Home Living   Living Arrangements: Alone Available Help at Discharge:  (children lives close by and come often to check on her.  ) Type of Home: House Home Access: Stairs to enter   [employer] of Steps: 1 Home Layout: One level Home Equipment: Clinical cytogeneticist - 2 wheels;Grab bars - tub/shower      Prior Function     Gait / Transfers Assistance Needed: Pt uses the RW all the time.  Pt goes to church on Sunday and Wednesday.  Hasn't been to the grocery store since Feb.    ADL's / Homemaking Assistance Needed: Dtr does groceries.  independent with dressing, Pt sits in the shower chair and is able to bathe herself.          Hand Dominance   Dominant Hand: Right    Extremity/Trunk Assessment   Upper Extremity Assessment: Generalized weakness           Lower Extremity Assessment: Generalized weakness         Communication   Communication: No difficulties  Cognition Arousal/Alertness: Awake/alert Behavior During Therapy: WFL for tasks assessed/performed Overall Cognitive Status: Within Functional Limits for tasks assessed                      General Comments      Exercises     Assessment/Plan    PT Assessment Patient needs continued PT services  PT Problem List Decreased strength;Decreased activity tolerance;Decreased balance;Decreased mobility;Decreased knowledge of use of DME;Decreased safety awareness;Decreased knowledge of precautions;Cardiopulmonary status limiting activity;Obesity;Pain          PT Treatment Interventions DME instruction;Gait training;Functional mobility training;Therapeutic activities;Therapeutic exercise;Balance training;Cognitive remediation;Patient/family education    PT Goals (Current goals can be  found in the Care Plan section)  Acute Rehab PT Goals Patient Stated Goal: Improve her strength and endurance.  PT Goal Formulation: With patient Time For Goal Achievement: 02/14/16 Potential to Achieve Goals: Good    Frequency Min 3X/week   Barriers to discharge Decreased caregiver support Pt lives alone    Co-evaluation               End of Session Equipment Utilized During Treatment: Gait belt;Oxygen Activity Tolerance: Patient tolerated treatment well;Patient limited by pain Patient left: in chair;with call bell/phone within reach Nurse Communication: Mobility status Jacqlyn Larsen, RN notified of PT d/c recs and mobilty status. )    Functional Assessment Tool Used: [employer] "6-clicks"  Functional Limitation: Mobility: Walking and moving around Mobility: Walking and Moving Around Current Status [phone]): At least 40 percent but less than 60 percent impaired, limited or restricted Mobility: Walking and Moving Around Goal Status [phone]): At least 20 percent but less than 40 percent  impaired, limited or restricted    Time: 0855-0925 PT Time Calculation (min) (ACUTE ONLY): 30 min   Charges:   PT Evaluation $PT Eval Low Complexity: 1 Procedure PT Treatments $Gait Training: 8-22 mins   PT G Codes:   PT G-Codes **NOT FOR INPATIENT CLASS** Functional Assessment Tool Used: [employer] "6-clicks"  Functional Limitation: Mobility: Walking and moving around Mobility: Walking and Moving Around Current Status [phone]): At least 40 percent but less than 60 percent impaired, limited or restricted Mobility: Walking and Moving Around Goal Status [phone]): At least 20 percent but less than 40 percent impaired, limited or restricted    Beth Trixy Loyola, PT, DPT X: [phone]

## 2016-02-07 NOTE — Progress Notes (Addendum)
PROGRESS NOTE                                                                                                                                                                                                             Patient Demographics:    [name], is a 82 y.o. female, DOB - 03/06/1926, [id number]  Admit date - 02/06/2016   Admitting Physician Orvan Falconer, MD  Outpatient Primary MD for the patient is [name], MD  LOS - 1  Chief Complaint  Patient presents with  . Nausea       Brief Narrative    [name] is a 82 y.o. female with medical history significant of CAD, CHF, CKD stage III, and HTN, LV thrombus on coumadin, presents to the ED with complaints of nausea and vomiting, in the ER diagnosed with UTI & possible PNA.    Subjective:    [facility] today has, No headache, No chest pain, No abdominal pain - No Nausea, No new weakness tingling or numbness, No Cough - SOB.     Assessment  & Plan :     1.Generalized weakness due to combination of aspiration pneumonia, UTI along with nausea vomiting and dehydration. She is much improved on empiric antibiotics which are Rocephin and doxycycline and we continued, follow cultures, she is currently asymptomatic. Will request speech to evaluate as well but most likely she aspirated when she vomited. Continue supportive care, PT eval, may require placement.  2. Anemia of chronic disease. Stable.  3. Chronic systolic heart failure. Recent echocardiogram shows EF of 25-30%. She is currently compensated from this standpoint. She is clinically dehydrated hence diuretics are held with gentle hydration. Continue Coreg and ARB.  4. CKD stage II. Creatinine Are better than baseline.  5. Hypertension. Continue Coreg and ARB.  6. Dyslipidemia. On statins continue.  7. History of chronic systolic CHF with mural thrombus. Continue Coumadin, CHF treatment as an #3  above.  8.DM2 - For now on sliding scale.  Lab Results  Component Value Date   HGBA1C (H) 03/17/2009    6.4 (NOTE) The ADA recommends the following therapeutic goal for glycemic control related to Hgb A1c measurement: Goal of therapy: <6.5 Hgb A1c  Reference: American Diabetes Association: Clinical Practice Recommendations 2010, Diabetes Care, 2010, 33: (Suppl  1).   CBG (last  3)   Recent Labs  02/06/16 2056 02/07/16 0759  GLUCAP 163* 119*      Family Communication  :  None  Code Status :  DNR  Diet : Diet heart healthy/carb modified Room service appropriate? Yes; Fluid consistency: Thin    Disposition Plan  :  TBD  Consults  :  None  Procedures  :    DVT Prophylaxis  :  Coumadin  Lab Results  Component Value Date   INR 1.75 02/07/2016   INR 1.59 02/06/2016   INR 1.5 02/04/2016   PROTIME 19.5 09/12/2008   PROTIME 16.4 08/22/2008     Lab Results  Component Value Date   PLT 178 02/07/2016    Inpatient Medications  Scheduled Meds: . atorvastatin  40 mg Oral q1800  . carvedilol  25 mg Oral BID WC  . cefTRIAXone (ROCEPHIN)  IV  1 g Intravenous Q24H  . cholecalciferol  1,000 Units Oral Daily  . doxycycline (VIBRAMYCIN) IV  100 mg Intravenous Q12H  . feeding supplement (PRO-STAT SUGAR FREE 64)  30 mL Oral BID  . folic acid  1 mg Oral Daily  . gabapentin  100 mg Oral TID  . insulin aspart  0-15 Units Subcutaneous TID WC  . insulin aspart  0-5 Units Subcutaneous QHS  . losartan  50 mg Oral Daily  . pantoprazole  40 mg Oral Daily  . polyethylene glycol  17 g Oral Daily  . sertraline  50 mg Oral Daily  . sodium chloride flush  3 mL Intravenous Q12H  . spironolactone  25 mg Oral Daily  . Warfarin - Pharmacist Dosing Inpatient   Does not apply Q24H   Continuous Infusions: . sodium chloride 50 mL/hr at 02/06/16 2057   PRN Meds:.acetaminophen **OR** acetaminophen, nitroGLYCERIN, ondansetron, traMADol  Antibiotics  :    Anti-infectives    Start      Dose/Rate Route Frequency Ordered Stop   02/07/16 1400  cefTRIAXone (ROCEPHIN) 1 g in dextrose 5 % 50 mL IVPB     1 g 100 mL/hr over 30 Minutes Intravenous Every 24 hours 02/06/16 1925     02/07/16 0200  doxycycline (VIBRAMYCIN) 100 mg in dextrose 5 % 250 mL IVPB     100 mg 125 mL/hr over 120 Minutes Intravenous Every 12 hours 02/06/16 1926     02/06/16 1400  cefTRIAXone (ROCEPHIN) 1 g in dextrose 5 % 50 mL IVPB     1 g 100 mL/hr over 30 Minutes Intravenous  Once 02/06/16 1346 02/06/16 1451   02/06/16 1400  doxycycline (VIBRA-TABS) tablet 100 mg     100 mg Oral  Once 02/06/16 1346 02/06/16 1400         Objective:   Vitals:   02/06/16 1800 02/06/16 1815 02/06/16 2011 02/07/16 0511  BP: 149/61  (!) 131/51 (!) 123/59  Pulse: (!) 32 65 68 (!) 57  Resp: 18 20 20 18   Temp:   98.5 F (36.9 C) 97.7 F (36.5 C)  TempSrc:    Oral  SpO2: (!) 86% 96% 96% 96%  Weight:   65.3 kg (143 lb 15.4 oz)   Height:   4\' 11"  (1.499 m)     Wt Readings from Last 3 Encounters:  02/06/16 65.3 kg (143 lb 15.4 oz)  11/30/15 64.9 kg (143 lb 1.9 oz)  11/12/15 64.4 kg (142 lb)     Intake/Output Summary (Last 24 hours) at 02/07/16 0852 Last data filed at 02/07/16 0600  Gross per 24 hour  Intake            702.5 ml  Output                0 ml  Net            702.5 ml     Physical Exam  Awake Alert, Oriented X 3, No new F.N deficits, Normal affect [place].AT,PERRAL Supple Neck,No JVD, No cervical lymphadenopathy appriciated.  Symmetrical Chest wall movement, Good air movement bilaterally, CTAB RRR,No Gallops,Rubs or new Murmurs, No Parasternal Heave +ve B.Sounds, Abd Soft, No tenderness, No organomegaly appriciated, No rebound - guarding or rigidity. No Cyanosis, Clubbing or edema, No new Rash or bruise      Data Review:    CBC  Recent Labs Lab 02/06/16 1247 02/07/16 0703  WBC 6.0 7.5  HGB 10.8* 10.0*  HCT 33.2* 30.8*  PLT 178 178  MCV 94.9 95.4  MCH 30.9 31.0  MCHC 32.5 32.5  RDW  12.6 12.6  LYMPHSABS 1.0  --   MONOABS 0.3  --   EOSABS 0.0  --   BASOSABS 0.0  --     Chemistries   Recent Labs Lab 02/06/16 1247 02/07/16 0703  NA 136 137  K 3.9 3.8  CL 102 103  CO2 27 28  GLUCOSE 107* 128*  BUN 30* 28*  CREATININE 1.08* 1.19*  CALCIUM 9.2 8.7*  AST 21 23  ALT 12* 12*  ALKPHOS 51 46  BILITOT 1.0 0.5   ------------------------------------------------------------------------------------------------------------------ No results for input(s): CHOL, HDL, LDLCALC, TRIG, CHOLHDL, LDLDIRECT in the last 72 hours.  Lab Results  Component Value Date   HGBA1C (H) 03/17/2009    6.4 (NOTE) The ADA recommends the following therapeutic goal for glycemic control related to Hgb A1c measurement: Goal of therapy: <6.5 Hgb A1c  Reference: American Diabetes Association: Clinical Practice Recommendations 2010, Diabetes Care, 2010, 33: (Suppl  1).   ------------------------------------------------------------------------------------------------------------------  Recent Labs  02/06/16 1247  TSH 0.781   ------------------------------------------------------------------------------------------------------------------ No results for input(s): VITAMINB12, FOLATE, FERRITIN, TIBC, IRON, RETICCTPCT in the last 72 hours.  Coagulation profile  Recent Labs Lab 02/04/16 1132 02/06/16 1247 02/07/16 0703  INR 1.5 1.59 1.75    No results for input(s): DDIMER in the last 72 hours.  Cardiac Enzymes  Recent Labs Lab 02/06/16 1247  TROPONINI <0.03   ------------------------------------------------------------------------------------------------------------------    Component Value Date/Time   BNP 368.9 (H) 03/13/2015 1030    Micro Results No results found for this or any previous visit (from the past 240 hour(s)).  Radiology Reports Dg Chest 2 View  Result Date: 02/06/2016 CLINICAL DATA:  Nausea, vomiting EXAM: CHEST  2 VIEW COMPARISON:  08/04/2014 FINDINGS:  There is right lower lobe airspace disease. There is no pleural effusion or pneumothorax. There is stable cardiomegaly. There is a dual lead cardiac pacemaker. There is calcification of the cardiac apex consistent with prior infarct with possible aneurysm of the cardiac apex. The osseous structures are unremarkable. IMPRESSION: Right lower lobe pneumonia. Followup PA and lateral chest X-ray is recommended in 3-4 weeks following trial of antibiotic therapy to ensure resolution and exclude underlying malignancy. Electronically Signed   By: Kathreen Devoid   On: 02/06/2016 12:58   Ct Abdomen Pelvis W Contrast  Result Date: 02/06/2016 CLINICAL DATA:  Nausea today. No vomiting. Recently increased Coumadin dose. History of nephrolithiasis. EXAM: CT ABDOMEN AND PELVIS WITH CONTRAST TECHNIQUE: Multidetector CT imaging of the abdomen and pelvis was performed using the standard protocol following bolus administration of  intravenous contrast. CONTRAST:  73mL ISOVUE-300 IOPAMIDOL (ISOVUE-300) INJECTION 61%, 61mL ISOVUE-300 IOPAMIDOL (ISOVUE-300) INJECTION 61% COMPARISON:  CT abdomen and pelvis November 13, 2015 FINDINGS: LOWER CHEST: Improved aeration of LEFT lung base. Similar RIGHT lung base atelectasis. Debris within the RIGHT lower lobe bronchi suspected. Moderate cardiomegaly. Severe coronary artery calcifications. Old LEFT ventricle calcified aneurysm. Pacemaker wires in place. No pericardial effusion. HEPATOBILIARY: Liver and gallbladder are normal. PANCREAS: Normal. SPLEEN: Sub cm benign-appearing cyst versus lymphangioma. ADRENALS/URINARY TRACT: Kidneys are orthotopic, demonstrating symmetric enhancement. Two RIGHT lower pole nephrolithiasis measure up to 4 mm, punctate RIGHT upper pole nephrolithiasis. No LEFT nephrolithiasis. Bilateral renal cysts, dominant within LEFT interpolar kidney measuring 2.8 cm. Too small to characterize hypodensities in the kidneys bilaterally. No solid renal masses. The unopacified ureters  are normal in course and caliber with urothelial enhancement, RIGHT greater than LEFT. Delayed imaging through the kidneys demonstrates symmetric prompt contrast excretion within the proximal urinary collecting system. Urinary bladder is partially distended and unremarkable. Bilateral benign adrenal adenomas. STOMACH/BOWEL: Small hiatal hernia. Small and large bowel are normal in course and caliber without inflammatory changes. Severe sigmoid diverticulosis. Normal appendix. VASCULAR/LYMPHATIC: Aortoiliac vessels are normal in course and caliber, severe atherosclerosis. No lymphadenopathy by CT size criteria. REPRODUCTIVE: Status post hysterectomy. OTHER: No intraperitoneal free fluid or free air. MUSCULOSKELETAL: Nonacute. Anterior abdominal wall ligamentous laxity. Osteopenia. Severe degenerative change of lumbar spine with grade 2 L5-S1 anterolisthesis. IMPRESSION: RIGHT greater than LEFT urothelial enhancement compatible with urinary tract infection, no pyelonephritis. Nonobstructing RIGHT lower pole nephrolithiasis. Suspected RIGHT lower lobe bronchial debris suggesting aspiration with RIGHT lung base atelectasis. Severe atherosclerosis. Electronically Signed   By: Elon Alas M.D.   On: 02/06/2016 15:32    Time Spent in minutes  30   SINGH,PRASHANT K M.D on 02/07/2016 at 8:52 AM  Between 7am to 7pm - Pager - [phone]  After 7pm go to [email or web address] - password [facility]  [facility] -  Office  [phone]

## 2016-02-07 NOTE — [place] FL2 (Signed)
[place] MEDICAID FL2 LEVEL OF CARE SCREENING TOOL     IDENTIFICATION  Patient Name: [name] Birthdate: 1925/07/27 Sex: female Admission Date (Current Location): 02/06/2016  [facility] and [place] Number:  [employer] and Address:  [place] [address], [place]      Provider Number: [id number]  Attending Physician Name and Address:  Thurnell Lose, MD  Relative Name and Phone Number:       Current Level of Care:   Recommended Level of Care:   Prior Approval Number:    Date Approved/Denied:   PASRR Number: [id number]  Discharge Plan: SNF    Current Diagnoses: Patient Active Problem List   Diagnosis Date Noted  . Weakness generalized 02/06/2016  . CAP (community acquired pneumonia) 02/06/2016  . CKD (chronic kidney disease) stage 3, GFR 30-59 ml/min 04/26/2015  . Urinary tract infection 04/25/2015  . Cardiomyopathy, ischemic 04/25/2015  . Fecal impaction ([place]) 04/25/2015  . Degenerative joint disease 04/25/2015  . Chest pain, precordial 08/28/2014  . Encounter for therapeutic drug monitoring 05/02/2013  . CKD (chronic kidney disease) 10/22/2011  . Angina at rest [facility]) 10/08/2011  . Lower urinary tract infectious disease 10/08/2011  . Ventricular tachycardia ([place]) 06/10/2011  . Systolic CHF, chronic ([place]) 12/11/2010  . LV (left ventricular) mural thrombus 12/11/2010  . Long term current use of anticoagulant therapy 06/25/2010  . ICD-Medtronic 05/01/2009  . HYPERCHOLESTEROLEMIA 06/19/2008  . Hyperlipidemia 06/19/2008  . HTN (hypertension) 06/19/2008  . Coronary artery disease due to calcified coronary lesion 06/19/2008  . Aneurysm of heart wall 06/19/2008  . UNSPECIFIED SYSTOLIC HEART FAILURE [id number]  . ARTHRITIS 06/19/2008  . SHORTNESS OF BREATH 06/19/2008    Orientation RESPIRATION BLADDER Height & Weight     Self, Time, Situation, Place  O2 Continent Weight: 143 lb 15.4 oz (65.3 kg) Height:  4\' 11"   (149.9 cm)  BEHAVIORAL SYMPTOMS/MOOD NEUROLOGICAL BOWEL NUTRITION STATUS      Continent Diet (Heart Healthy/Carb Modified.)  AMBULATORY STATUS COMMUNICATION OF NEEDS Skin   Limited Assist Verbally Normal                       Personal Care Assistance Level of Assistance  Feeding, Dressing Bathing Assistance: Limited assistance Feeding assistance: Independent Dressing Assistance: Limited assistance     Functional Limitations Info  Sight, Hearing, Speech Sight Info: Adequate Hearing Info: Adequate Speech Info: Adequate    SPECIAL CARE FACTORS FREQUENCY  PT (By licensed PT)     PT Frequency: 5x/week              Contractures Contractures Info: Not present    Additional Factors Info  Code Status, Allergies, Psychotropic, Isolation Precautions Code Status Info: DNR  Allergies Info: Codeine Psychotropic Info: Neurontin, Zoloft   Isolation Precautions Info: Patient has history of ESBL. 08/04/14 and 08/28/14 patient had positive E. Coli ESBL cultured from her Urine (cj)      Current Medications (02/07/2016):  This is the current hospital active medication list Current Facility-Administered Medications  Medication Dose Route Frequency Provider Last Rate Last Dose  . 0.9 %  sodium chloride infusion   Intravenous Continuous Thurnell Lose, MD 50 mL/hr at 02/06/16 2057    . acetaminophen (TYLENOL) tablet 650 mg  650 mg Oral Q6H PRN Orvan Falconer, MD       Or  . acetaminophen (TYLENOL) suppository 650 mg  650 mg Rectal Q6H PRN Orvan Falconer, MD      .  atorvastatin (LIPITOR) tablet 40 mg  40 mg Oral q1800 Orvan Falconer, MD      . carvedilol (COREG) tablet 25 mg  25 mg Oral BID WC Orvan Falconer, MD   25 mg at 02/07/16 0835  . cefTRIAXone (ROCEPHIN) 1 g in dextrose 5 % 50 mL IVPB  1 g Intravenous Q24H Orvan Falconer, MD      . cholecalciferol (VITAMIN D) tablet 1,000 Units  1,000 Units Oral Daily Orvan Falconer, MD   1,000 Units at 02/07/16 1026  . doxycycline (VIBRAMYCIN) 100 mg in dextrose 5 % 250 mL IVPB   100 mg Intravenous Q12H Orvan Falconer, MD   100 mg at 02/07/16 0204  . feeding supplement (PRO-STAT SUGAR FREE 64) liquid 30 mL  30 mL Oral BID Thurnell Lose, MD   30 mL at 02/07/16 1024  . folic acid (FOLVITE) tablet 1 mg  1 mg Oral Daily Orvan Falconer, MD   1 mg at 02/07/16 1026  . gabapentin (NEURONTIN) capsule 100 mg  100 mg Oral TID Orvan Falconer, MD   100 mg at 02/07/16 1025  . insulin aspart (novoLOG) injection 0-15 Units  0-15 Units Subcutaneous TID WC Orvan Falconer, MD      . insulin aspart (novoLOG) injection 0-5 Units  0-5 Units Subcutaneous QHS Orvan Falconer, MD      . losartan (COZAAR) tablet 50 mg  50 mg Oral Daily Orvan Falconer, MD   50 mg at 02/07/16 1026  . nitroGLYCERIN (NITROSTAT) SL tablet 0.4 mg  0.4 mg Sublingual Q5 min PRN Orvan Falconer, MD      . ondansetron [facility]) injection 4 mg  4 mg Intravenous Q1H PRN Francine Graven, DO   4 mg at 02/06/16 1240  . pantoprazole (PROTONIX) EC tablet 40 mg  40 mg Oral Daily Orvan Falconer, MD   40 mg at 02/07/16 1025  . polyethylene glycol (MIRALAX / GLYCOLAX) packet 17 g  17 g Oral Daily Orvan Falconer, MD   17 g at 02/07/16 1024  . sertraline (ZOLOFT) tablet 50 mg  50 mg Oral Daily Orvan Falconer, MD   50 mg at 02/07/16 1026  . sodium chloride flush (NS) 0.9 % injection 3 mL  3 mL Intravenous Q12H Orvan Falconer, MD   3 mL at 02/06/16 2058  . spironolactone (ALDACTONE) tablet 25 mg  25 mg Oral Daily Orvan Falconer, MD   25 mg at 02/07/16 1025  . traMADol (ULTRAM) tablet 50 mg  50 mg Oral Q6H PRN Orvan Falconer, MD   50 mg at 02/07/16 [id number]  . Warfarin - Pharmacist Dosing Inpatient   Does not apply Q24H Orvan Falconer, MD         Discharge Medications: Please see discharge summary for a list of discharge medications.  Relevant Imaging Results:  Relevant Lab Results:   Additional Information SSN [id number]  Lindaann Gradilla, Clydene Pugh, LCSW

## 2016-02-08 DIAGNOSIS — R531 Weakness: Secondary | ICD-10-CM | POA: Diagnosis not present

## 2016-02-08 DIAGNOSIS — N200 Calculus of kidney: Secondary | ICD-10-CM | POA: Diagnosis not present

## 2016-02-08 DIAGNOSIS — M1990 Unspecified osteoarthritis, unspecified site: Secondary | ICD-10-CM | POA: Diagnosis not present

## 2016-02-08 DIAGNOSIS — M6281 Muscle weakness (generalized): Secondary | ICD-10-CM | POA: Diagnosis not present

## 2016-02-08 DIAGNOSIS — Z79899 Other long term (current) drug therapy: Secondary | ICD-10-CM | POA: Diagnosis not present

## 2016-02-08 DIAGNOSIS — E1140 Type 2 diabetes mellitus with diabetic neuropathy, unspecified: Secondary | ICD-10-CM | POA: Diagnosis not present

## 2016-02-08 DIAGNOSIS — I509 Heart failure, unspecified: Secondary | ICD-10-CM | POA: Diagnosis not present

## 2016-02-08 DIAGNOSIS — I10 Essential (primary) hypertension: Secondary | ICD-10-CM | POA: Diagnosis not present

## 2016-02-08 DIAGNOSIS — E119 Type 2 diabetes mellitus without complications: Secondary | ICD-10-CM | POA: Diagnosis not present

## 2016-02-08 DIAGNOSIS — K219 Gastro-esophageal reflux disease without esophagitis: Secondary | ICD-10-CM | POA: Diagnosis not present

## 2016-02-08 DIAGNOSIS — I2510 Atherosclerotic heart disease of native coronary artery without angina pectoris: Secondary | ICD-10-CM | POA: Diagnosis not present

## 2016-02-08 DIAGNOSIS — R1311 Dysphagia, oral phase: Secondary | ICD-10-CM | POA: Diagnosis not present

## 2016-02-08 DIAGNOSIS — M810 Age-related osteoporosis without current pathological fracture: Secondary | ICD-10-CM | POA: Diagnosis not present

## 2016-02-08 DIAGNOSIS — J189 Pneumonia, unspecified organism: Secondary | ICD-10-CM | POA: Diagnosis not present

## 2016-02-08 DIAGNOSIS — D631 Anemia in chronic kidney disease: Secondary | ICD-10-CM | POA: Diagnosis not present

## 2016-02-08 DIAGNOSIS — I220 Subsequent ST elevation (STEMI) myocardial infarction of anterior wall: Secondary | ICD-10-CM | POA: Diagnosis not present

## 2016-02-08 DIAGNOSIS — I5040 Unspecified combined systolic (congestive) and diastolic (congestive) heart failure: Secondary | ICD-10-CM | POA: Diagnosis not present

## 2016-02-08 DIAGNOSIS — N390 Urinary tract infection, site not specified: Secondary | ICD-10-CM | POA: Diagnosis not present

## 2016-02-08 DIAGNOSIS — E039 Hypothyroidism, unspecified: Secondary | ICD-10-CM | POA: Diagnosis not present

## 2016-02-08 DIAGNOSIS — Z7401 Bed confinement status: Secondary | ICD-10-CM | POA: Diagnosis not present

## 2016-02-08 DIAGNOSIS — I255 Ischemic cardiomyopathy: Secondary | ICD-10-CM | POA: Diagnosis not present

## 2016-02-08 DIAGNOSIS — D649 Anemia, unspecified: Secondary | ICD-10-CM | POA: Diagnosis not present

## 2016-02-08 DIAGNOSIS — R279 Unspecified lack of coordination: Secondary | ICD-10-CM | POA: Diagnosis not present

## 2016-02-08 DIAGNOSIS — I236 Thrombosis of atrium, auricular appendage, and ventricle as current complications following acute myocardial infarction: Secondary | ICD-10-CM | POA: Diagnosis not present

## 2016-02-08 DIAGNOSIS — E782 Mixed hyperlipidemia: Secondary | ICD-10-CM | POA: Diagnosis not present

## 2016-02-08 DIAGNOSIS — E785 Hyperlipidemia, unspecified: Secondary | ICD-10-CM | POA: Diagnosis not present

## 2016-02-08 DIAGNOSIS — R0602 Shortness of breath: Secondary | ICD-10-CM | POA: Diagnosis not present

## 2016-02-08 DIAGNOSIS — N189 Chronic kidney disease, unspecified: Secondary | ICD-10-CM | POA: Diagnosis not present

## 2016-02-08 DIAGNOSIS — R278 Other lack of coordination: Secondary | ICD-10-CM | POA: Diagnosis not present

## 2016-02-08 DIAGNOSIS — Z09 Encounter for follow-up examination after completed treatment for conditions other than malignant neoplasm: Secondary | ICD-10-CM | POA: Diagnosis not present

## 2016-02-08 DIAGNOSIS — R2689 Other abnormalities of gait and mobility: Secondary | ICD-10-CM | POA: Diagnosis not present

## 2016-02-08 DIAGNOSIS — I5020 Unspecified systolic (congestive) heart failure: Secondary | ICD-10-CM | POA: Diagnosis not present

## 2016-02-08 LAB — PROTIME-INR
INR: 2.3
Prothrombin Time: 25.7 seconds — ABNORMAL HIGH (ref 11.4–15.2)

## 2016-02-08 LAB — GLUCOSE, CAPILLARY
GLUCOSE-CAPILLARY: 144 mg/dL — AB (ref 65–99)
Glucose-Capillary: 120 mg/dL — ABNORMAL HIGH (ref 65–99)

## 2016-02-08 LAB — HEMOGLOBIN A1C
Hgb A1c MFr Bld: 6 % — ABNORMAL HIGH (ref 4.8–5.6)
Mean Plasma Glucose: 126 mg/dL

## 2016-02-08 MED ORDER — HYDRALAZINE HCL 20 MG/ML IJ SOLN
10.0000 mg | Freq: Four times a day (QID) | INTRAMUSCULAR | Status: DC | PRN
  Administered 2016-02-08: 10 mg via INTRAVENOUS
  Filled 2016-02-08: qty 1

## 2016-02-08 MED ORDER — ISOSORBIDE MONONITRATE ER 30 MG PO TB24: 30.0000 mg | ORAL_TABLET | Freq: Every day | ORAL | Status: DC

## 2016-02-08 MED ORDER — DOXYCYCLINE HYCLATE 100 MG PO CAPS: 100.0000 mg | ORAL_CAPSULE | Freq: Two times a day (BID) | ORAL | 0 refills | Status: DC

## 2016-02-08 MED ORDER — ISOSORBIDE MONONITRATE ER 60 MG PO TB24
30.0000 mg | ORAL_TABLET | Freq: Every day | ORAL | Status: DC
  Filled 2016-02-08: qty 1

## 2016-02-08 MED ORDER — FUROSEMIDE 40 MG PO TABS: 40.0000 mg | ORAL_TABLET | Freq: Two times a day (BID) | ORAL | 3 refills | Status: DC

## 2016-02-08 MED ORDER — FUROSEMIDE 10 MG/ML IJ SOLN
40.0000 mg | Freq: Once | INTRAMUSCULAR | Status: AC
  Administered 2016-02-08: 40 mg via INTRAVENOUS
  Filled 2016-02-08: qty 4

## 2016-02-08 MED ORDER — CEFPODOXIME PROXETIL 200 MG PO TABS: 200.0000 mg | ORAL_TABLET | Freq: Two times a day (BID) | ORAL | 0 refills | Status: AC

## 2016-02-08 NOTE — Progress Notes (Signed)
Discharged PT per MD order and protocol. Discharge handouts reviewed/explained. Education completed.  Pt verbalized understanding and left with all belongings. VSS. IV catheter D/C.  Called report Microbiologist at [facility]. Pt aware of going to SNF. ETA arrival time at 1200. Oswald Hillock, RN

## 2016-02-08 NOTE — Care Management Important Message (Signed)
Important Message  Patient Details  Name: [name] MRN: [id number] Date of Birth: June 01, 1925   Medicare Important Message Given:  Yes    Sherald Barge, RN 02/08/2016, 10:04 AM

## 2016-02-08 NOTE — Progress Notes (Signed)
Pt taken out by EMS. Oswald Hillock, RN

## 2016-02-08 NOTE — Clinical Social Work Placement (Signed)
   CLINICAL SOCIAL WORK PLACEMENT  NOTE  Date:  02/08/2016  Patient Details  Name: [name] MRN: [id number] Date of Birth: 06-Dec-1925  Clinical Social Work is seeking post-discharge placement for this patient at the [place] level of care (*CSW will initial, date and re-position this form in  chart as items are completed):  Yes   Patient/family provided with Mobile Work Department's list of facilities offering this level of care within the geographic area requested by the patient (or if unable, by the patient's family).  Yes   Patient/family informed of their freedom to choose among providers that offer the needed level of care, that participate in Medicare, Medicaid or managed care program needed by the patient, have an available bed and are willing to accept the patient.  Yes   Patient/family informed of [place]'s ownership interest in [facility] and [facility], as well as of the fact that they are under no obligation to receive care at these facilities.  PASRR submitted to EDS on 02/07/16     PASRR number received on 02/07/16     Existing PASRR number confirmed on       FL2 transmitted to all facilities in geographic area requested by pt/family on       FL2 transmitted to all facilities within larger geographic area on       Patient informed that his/her managed care company has contracts with or will negotiate with certain facilities, including the following:        Yes   Patient/family informed of bed offers received.  Patient chooses bed at [facility]     Physician recommends and patient chooses bed at      Patient to be transferred to [facility] on 02/08/16.  Patient to be transferred to facility by [facility]     Patient family notified on 02/08/16 of transfer.  Name of family member notified:  Raynald Kemp, Daughter.      PHYSICIAN       Additional Comment: Clinicals sent via [employer]. CSW signing  off.    _______________________________________________ Ihor Gully, LCSW 02/08/2016, 9:46 AM

## 2016-02-08 NOTE — Care Management Note (Signed)
Case Management Note  Patient Details  Name: KSENIYA SWEEZY MRN: [id number] Date of Birth: 1926/01/26  Expected Discharge Date:     02/08/2016             Expected Discharge Plan:  Skilled Nursing Facility  In-House Referral:  Clinical Social Work  Discharge planning Services  CM Consult  Post Acute Care Choice:  NA Choice offered to:    NA  Status of Service:  Completed, signed off    Additional Comments:  Pt discharging today to SNF. CSW has made arrangements. No CM needs.   Sherald Barge, RN 02/08/2016, 10:04 AM

## 2016-02-08 NOTE — Discharge Summary (Signed)
[name] [name] [id number] DOB: December 23, 1925 [name]: 02/06/2016  PCP: [name] Gravel, MD  Admit date: 02/06/2016  Discharge date: 02/08/2016  Admitted From: Home   Disposition:  SNF   Recommendations for Outpatient Follow-up:   Follow up with PCP in 1-2 weeks  PCP Please obtain BMP/CBC, 2 view CXR in 1week,  (see Discharge instructions)   PCP Please follow up on the following pending results: None   Home Health: None   Equipment/Devices: None  Consultations: None Discharge Condition: Fair   CODE STATUS: DNR   Diet Recommendation: Diet heart healthy/carb modified with 1.5 L total fluid restriction on a daily basis  Chief Complaint  Patient presents with  . Nausea     Brief history of present illness from the day of admission and additional interim summary    [name] Luma Peayis a 82 y.o.femalewith medical history significant of CAD, CHF, CKD stage III, and HTN, LV thrombus on coumadin, presents to the ED with complaints of nausea and vomiting, in the ER diagnosed with UTI & possible PNA.  Hospital issues addressed     1.Generalized weakness due to combination of aspiration pneumonia, UTI along with nausea vomiting and dehydration. She is much improved on empiric antibiotics which are Rocephin and doxycycline and will be continued on 5 more days of oral doxycycline and Vantin, so far cultures negative, she is currently asymptomatic. She was cleared by speech therapy for regular diet, most likely she aspirated when she vomited. She is on 2 L nasal cannula oxygen at home which will be continued along with supportive care, qualifies for SNF. Request SNF staff to check CBC, BMP, INR and two-view chest x-ray in 2-3 days.  2. Anemia of chronic disease. Stable.  3. Acute on chronic combined systolic and diastolic  heart failure. Recent echocardiogram shows EF of 25-30%. She got mild pulmonary edema due to IV fluids she received at the time of admission in the ER, she was diuresed with IV Lasix to good effect, has minimal drains now, will increase her Lasix dose upon discharge from 60mg  daily to 40 mg twice a day, strict 1.5 L daily fluid restriction, and Imdur and continue Coreg and ARB. Monitor weight, BMP and diverticulosis).   4. CKD stage II. Creatinine Are better than baseline.  5. Hypertension. Continue Coreg and ARB, have read and Imdur for better control.  6. Dyslipidemia. On statins continue.  7. History of chronic systolic CHF with mural thrombus. Continue Coumadin, CHF treatment as an #3 above. Check INR in 2 days adjust Coumadin dose as needed.  8.DM2 - low, diet, CBGs every before meals at bedtime.    Discharge diagnosis     Principal Problem:   Lower urinary tract infectious disease Active Problems:   HYPERCHOLESTEROLEMIA   Hyperlipidemia   Long term current use of anticoagulant therapy   Weakness generalized   CAP (community acquired pneumonia)    Discharge instructions    Discharge Instructions    Discharge instructions    Complete by:  As directed  Follow with Primary MD [name] Gravel, MD in 7 days   Get CBC, CMP, INR, 2 view Chest X ray checked  by SNF MD in 2 days ( we routinely change or add medications that can affect your baseline labs and fluid status, therefore we recommend that you get the mentioned basic workup next visit with your PCP, your PCP may decide not to get them or add new tests based on their clinical decision)   Activity: As tolerated with Full fall precautions use walker/cane & assistance as needed   Disposition SNF   Diet:   Diet heart healthy/carb modified with feeding assistance and aspiration precautions.  Check your Weight same time everyday, if you gain over 2 pounds, or you develop in leg swelling, experience more shortness of  breath or chest pain, call your Primary MD immediately. Follow Cardiac Low Salt Diet and 1.5 lit/day fluid restriction.   On your next visit with your primary care physician please Get Medicines reviewed and adjusted.   Please request your Prim.MD to go over all Hospital Tests and Procedure/Radiological results at the follow up, please get all Hospital records sent to your Prim MD by signing hospital release before you go home.   If you experience worsening of your admission symptoms, develop shortness of breath, life threatening emergency, suicidal or homicidal thoughts you must seek medical attention immediately by calling 911 or calling your MD immediately  if symptoms less severe.  You Must read complete instructions/literature along with all the possible adverse reactions/side effects for all the Medicines you take and that have been prescribed to you. Take any new Medicines after you have completely understood and accpet all the possible adverse reactions/side effects.   Do not drive, operate heavy machinery, perform activities at heights, swimming or participation in water activities or provide baby sitting services if your were admitted for syncope or siezures until you have seen by Primary MD or a Neurologist and advised to do so again.  Do not drive when taking Pain medications.    Do not take more than prescribed Pain, Sleep and Anxiety Medications  Special Instructions: If you have smoked or chewed Tobacco  in the last 2 yrs please stop smoking, stop any regular Alcohol  and or any Recreational drug use.  Wear Seat belts while driving.   Please note  You were cared for by a hospitalist during your hospital stay. If you have any questions about your discharge medications or the care you received while you were in the hospital after you are discharged, you can call the unit and asked to speak with the hospitalist on call if the hospitalist that took care of you is not available.  Once you are discharged, your primary care physician will handle any further medical issues. Please note that NO REFILLS for any discharge medications will be authorized once you are discharged, as it is imperative that you return to your primary care physician (or establish a relationship with a primary care physician if you do not have one) for your aftercare needs so that they can reassess your need for medications and monitor your lab values.   Increase activity slowly    Complete by:  As directed       Discharge Medications     Medication List    TAKE these medications   acetaminophen 325 MG tablet Commonly known as:  TYLENOL Take 325-650 mg by mouth every 6 (six) hours as needed for mild pain.   atorvastatin 40  MG tablet Commonly known as:  LIPITOR Take 1 tablet (40 mg total) by mouth daily at 6 PM.   carvedilol 25 MG tablet Commonly known as:  COREG Take 1 Tablet by mouth 2 times a day   cefpodoxime 200 MG tablet Commonly known as:  VANTIN Take 1 tablet (200 mg total) by mouth 2 (two) times daily.   cholecalciferol 1000 units tablet Commonly known as:  VITAMIN D Take 1,000 Units by mouth daily.   doxycycline 100 MG capsule Commonly known as:  VIBRAMYCIN Take 1 capsule (100 mg total) by mouth 2 (two) times daily.   folic acid 1 MG tablet Commonly known as:  FOLVITE Take 1 mg by mouth daily.   furosemide 40 MG tablet Commonly known as:  LASIX Take 1 tablet (40 mg total) by mouth 2 (two) times daily. What changed:  how much to take  how to take this  when to take this  additional instructions   gabapentin 100 MG capsule Commonly known as:  NEURONTIN 1 capsule 3 (three) times daily.   isosorbide mononitrate 30 MG 24 hr tablet Commonly known as:  IMDUR Take 1 tablet (30 mg total) by mouth daily.   losartan 50 MG tablet Commonly known as:  COZAAR Take 1 Tablet by mouth once daily   nitroGLYCERIN 0.4 MG SL tablet Commonly known as:  NITROSTAT Place 1  tablet (0.4 mg total) under the tongue every 5 (five) minutes as needed for chest pain.   pantoprazole 40 MG tablet Commonly known as:  PROTONIX Take 1 Tablet by mouth once daily   polyethylene glycol packet Commonly known as:  MIRALAX / GLYCOLAX Take 17 g by mouth daily.   saxagliptin HCl 2.5 MG Tabs tablet Commonly known as:  ONGLYZA Take 2.5 mg by mouth daily.   sertraline 50 MG tablet Commonly known as:  ZOLOFT Take 50 mg by mouth daily.   spironolactone 25 MG tablet Commonly known as:  ALDACTONE Take 1 tablet (25 mg total) by mouth daily.   traMADol 50 MG tablet Commonly known as:  ULTRAM Take 1 tablet (50 mg total) by mouth every 6 (six) hours as needed for moderate pain.   warfarin 2.5 MG tablet Commonly known as:  COUMADIN TAKE AS DIRECTED BY ANTICOAGULATION CLINIC       Follow-up Information    [name] Gravel, MD. Schedule an appointment as soon as possible for a visit in 1 week(s).   Specialty:  Internal Medicine Contact information: [address] [place] [address] [phone]           Major procedures and Radiology Reports - PLEASE review detailed and final reports thoroughly  -        Dg Chest 2 View  Result Date: 02/06/2016 CLINICAL DATA:  Nausea, vomiting EXAM: CHEST  2 VIEW COMPARISON:  08/04/2014 FINDINGS: There is right lower lobe airspace disease. There is no pleural effusion or pneumothorax. There is stable cardiomegaly. There is a dual lead cardiac pacemaker. There is calcification of the cardiac apex consistent with prior infarct with possible aneurysm of the cardiac apex. The osseous structures are unremarkable. IMPRESSION: Right lower lobe pneumonia. Followup PA and lateral chest X-ray is recommended in 3-4 weeks following trial of antibiotic therapy to ensure resolution and exclude underlying malignancy. Electronically Signed   By: Kathreen Devoid   On: 02/06/2016 12:58   Ct Abdomen Pelvis W Contrast  Result Date:  02/06/2016 CLINICAL DATA:  Nausea today. No vomiting. Recently increased Coumadin dose.  History of nephrolithiasis. EXAM: CT ABDOMEN AND PELVIS WITH CONTRAST TECHNIQUE: Multidetector CT imaging of the abdomen and pelvis was performed using the standard protocol following bolus administration of intravenous contrast. CONTRAST:  72mL ISOVUE-300 IOPAMIDOL (ISOVUE-300) INJECTION 61%, 32mL ISOVUE-300 IOPAMIDOL (ISOVUE-300) INJECTION 61% COMPARISON:  CT abdomen and pelvis November 13, 2015 FINDINGS: LOWER CHEST: Improved aeration of LEFT lung base. Similar RIGHT lung base atelectasis. Debris within the RIGHT lower lobe bronchi suspected. Moderate cardiomegaly. Severe coronary artery calcifications. Old LEFT ventricle calcified aneurysm. Pacemaker wires in place. No pericardial effusion. HEPATOBILIARY: Liver and gallbladder are normal. PANCREAS: Normal. SPLEEN: Sub cm benign-appearing cyst versus lymphangioma. ADRENALS/URINARY TRACT: Kidneys are orthotopic, demonstrating symmetric enhancement. Two RIGHT lower pole nephrolithiasis measure up to 4 mm, punctate RIGHT upper pole nephrolithiasis. No LEFT nephrolithiasis. Bilateral renal cysts, dominant within LEFT interpolar kidney measuring 2.8 cm. Too small to characterize hypodensities in the kidneys bilaterally. No solid renal masses. The unopacified ureters are normal in course and caliber with urothelial enhancement, RIGHT greater than LEFT. Delayed imaging through the kidneys demonstrates symmetric prompt contrast excretion within the proximal urinary collecting system. Urinary bladder is partially distended and unremarkable. Bilateral benign adrenal adenomas. STOMACH/BOWEL: Small hiatal hernia. Small and large bowel are normal in course and caliber without inflammatory changes. Severe sigmoid diverticulosis. Normal appendix. VASCULAR/LYMPHATIC: Aortoiliac vessels are normal in course and caliber, severe atherosclerosis. No lymphadenopathy by CT size criteria.  REPRODUCTIVE: Status post hysterectomy. OTHER: No intraperitoneal free fluid or free air. MUSCULOSKELETAL: Nonacute. Anterior abdominal wall ligamentous laxity. Osteopenia. Severe degenerative change of lumbar spine with grade 2 L5-S1 anterolisthesis. IMPRESSION: RIGHT greater than LEFT urothelial enhancement compatible with urinary tract infection, no pyelonephritis. Nonobstructing RIGHT lower pole nephrolithiasis. Suspected RIGHT lower lobe bronchial debris suggesting aspiration with RIGHT lung base atelectasis. Severe atherosclerosis. Electronically Signed   By: Elon Alas M.D.   On: 02/06/2016 15:32    Micro Results    Recent Results (from the past 240 hour(s))  Urine culture     Status: None (Preliminary result)   Collection Time: 02/06/16 12:15 PM  Result Value Ref Range Status   Specimen Description URINE, CLEAN CATCH  Final   Special Requests NONE  Final   Culture PENDING  Incomplete   Report Status PENDING  Incomplete    Today   Subjective    Melda Siebels today has no headache,no chest abdominal pain,no new weakness tingling or numbness, feels much better wants to go home today.     Objective   Blood pressure (!) 160/67, pulse 67, temperature 97.7 F (36.5 C), temperature source Oral, resp. rate 20, height 4\' 11"  (1.499 m), weight 65.3 kg (143 lb 15.4 oz), SpO2 94 %.   Intake/Output Summary (Last 24 hours) at 02/08/16 0857 Last data filed at 02/08/16 0849  Gross per 24 hour  Intake             1480 ml  Output              900 ml  Net              580 ml    Exam Awake Alert, Oriented x 3, No new F.N deficits, Normal affect [place].AT,PERRAL Supple Neck,No JVD, No cervical lymphadenopathy appriciated.  Symmetrical Chest wall movement, Good air movement bilaterally, few rales RRR,No Gallops,Rubs or new Murmurs, No Parasternal Heave +ve B.Sounds, Abd Soft, Non tender, No organomegaly appriciated, No rebound -guarding or rigidity. No Cyanosis, Clubbing or edema, No  new Rash or bruise  Data Review   CBC w Diff:  Lab Results  Component Value Date   WBC 7.5 02/07/2016   HGB 10.0 (L) 02/07/2016   HCT 30.8 (L) 02/07/2016   PLT 178 02/07/2016   LYMPHOPCT 17 02/06/2016   MONOPCT 5 02/06/2016   EOSPCT 0 02/06/2016   BASOPCT 0 02/06/2016    CMP:  Lab Results  Component Value Date   NA 137 02/07/2016   K 3.8 02/07/2016   CL 103 02/07/2016   CO2 28 02/07/2016   BUN 28 (H) 02/07/2016   CREATININE 1.19 (H) 02/07/2016   PROT 6.2 (L) 02/07/2016   ALBUMIN 3.2 (L) 02/07/2016   BILITOT 0.5 02/07/2016   ALKPHOS 46 02/07/2016   AST 23 02/07/2016   ALT 12 (L) 02/07/2016  .   Total Time in preparing paper work, data evaluation and todays exam - 35 minutes  Thurnell Lose M.D on 02/08/2016 at 8:57 AM  [facility]   Office  [phone]

## 2016-02-08 NOTE — Discharge Instructions (Signed)
·   Follow with Primary MD [name], MD in 7 days     Get CBC, CMP, INR, 2 view Chest X ray checked  by SNF MD in 2 days ( we routinely change or add medications that can affect your baseline labs and fluid status, therefore we recommend that you get the mentioned basic workup next visit with your PCP, your PCP may decide not to get them or add new tests based on their clinical decision)      Activity: As tolerated with Full fall precautions use walker/cane & assistance as needed      Disposition SNF      Diet:   Diet heart healthy/carb modified with feeding assistance and aspiration precautions.    Check your Weight same time everyday, if you gain over 2 pounds, or you develop in leg swelling, experience more shortness of breath or chest pain, call your Primary MD immediately. Follow Cardiac Low Salt Diet and 1.5 lit/day fluid restriction.      On your next visit with your primary care physician please Get Medicines reviewed and adjusted.      Please request your Prim.MD to go over all Hospital Tests and Procedure/Radiological results at the follow up, please get all Hospital records sent to your Prim MD by signing hospital release before you go home.      If you experience worsening of your admission symptoms, develop shortness of breath, life threatening emergency, suicidal or homicidal thoughts you must seek medical attention immediately by calling 911 or calling your MD immediately  if symptoms less severe.    You Must read complete instructions/literature along with all the possible adverse reactions/side effects for all the Medicines you take and that have been prescribed to you. Take any new Medicines after you have completely understood and accpet all the possible adverse reactions/side effects.     Do not drive, operate heavy machinery, perform activities at heights, swimming or participation in water activities or provide baby sitting services if your  were admitted for syncope or siezures until you have seen by Primary MD or a Neurologist and advised to do so again.    Do not drive when taking Pain medications.    Do not take more than prescribed Pain, Sleep and Anxiety Medications  Special Instructions: If you have smoked or chewed Tobacco  in the last 2 yrs please stop smoking, stop any regular Alcohol  and or any Recreational drug use.  Wear Seat belts while driving.   Please note  You were cared for by a hospitalist during your hospital stay. If you have any questions about your discharge medications or the care you received while you were in the hospital after you are discharged, you can call the unit and asked to speak with the hospitalist on call if the hospitalist that took care of you is not available. Once you are discharged, your primary care physician will handle any further medical issues. Please note that NO REFILLS for any discharge medications will be authorized once you are discharged, as it is imperative that you return to your primary care physician (or establish a relationship with a primary care physician if you do not have one) for your aftercare needs so that they can reassess your need for medications and monitor your lab values.

## 2016-02-10 LAB — URINE CULTURE

## 2016-02-18 DIAGNOSIS — E119 Type 2 diabetes mellitus without complications: Secondary | ICD-10-CM | POA: Diagnosis not present

## 2016-02-18 DIAGNOSIS — I10 Essential (primary) hypertension: Secondary | ICD-10-CM | POA: Diagnosis not present

## 2016-02-18 DIAGNOSIS — D631 Anemia in chronic kidney disease: Secondary | ICD-10-CM | POA: Diagnosis not present

## 2016-02-18 DIAGNOSIS — K219 Gastro-esophageal reflux disease without esophagitis: Secondary | ICD-10-CM | POA: Diagnosis not present

## 2016-03-05 DIAGNOSIS — I5040 Unspecified combined systolic (congestive) and diastolic (congestive) heart failure: Secondary | ICD-10-CM | POA: Diagnosis not present

## 2016-03-05 DIAGNOSIS — I10 Essential (primary) hypertension: Secondary | ICD-10-CM | POA: Diagnosis not present

## 2016-03-05 DIAGNOSIS — R531 Weakness: Secondary | ICD-10-CM | POA: Diagnosis not present

## 2016-03-05 DIAGNOSIS — E119 Type 2 diabetes mellitus without complications: Secondary | ICD-10-CM | POA: Diagnosis not present

## 2016-03-05 DIAGNOSIS — D631 Anemia in chronic kidney disease: Secondary | ICD-10-CM | POA: Diagnosis not present

## 2016-03-10 DIAGNOSIS — J189 Pneumonia, unspecified organism: Secondary | ICD-10-CM | POA: Diagnosis not present

## 2016-03-10 DIAGNOSIS — R2689 Other abnormalities of gait and mobility: Secondary | ICD-10-CM | POA: Diagnosis not present

## 2016-03-11 ENCOUNTER — Other Ambulatory Visit (HOSPITAL_COMMUNITY): Payer: Self-pay | Admitting: Internal Medicine

## 2016-03-11 DIAGNOSIS — R2689 Other abnormalities of gait and mobility: Secondary | ICD-10-CM | POA: Diagnosis not present

## 2016-03-11 DIAGNOSIS — I509 Heart failure, unspecified: Secondary | ICD-10-CM | POA: Diagnosis not present

## 2016-03-11 DIAGNOSIS — J189 Pneumonia, unspecified organism: Secondary | ICD-10-CM | POA: Diagnosis not present

## 2016-03-11 DIAGNOSIS — R0602 Shortness of breath: Secondary | ICD-10-CM | POA: Diagnosis not present

## 2016-03-11 DIAGNOSIS — I236 Thrombosis of atrium, auricular appendage, and ventricle as current complications following acute myocardial infarction: Secondary | ICD-10-CM

## 2016-03-12 DIAGNOSIS — R2689 Other abnormalities of gait and mobility: Secondary | ICD-10-CM | POA: Diagnosis not present

## 2016-03-12 DIAGNOSIS — J189 Pneumonia, unspecified organism: Secondary | ICD-10-CM | POA: Diagnosis not present

## 2016-03-13 DIAGNOSIS — J189 Pneumonia, unspecified organism: Secondary | ICD-10-CM | POA: Diagnosis not present

## 2016-03-13 DIAGNOSIS — R2689 Other abnormalities of gait and mobility: Secondary | ICD-10-CM | POA: Diagnosis not present

## 2016-03-14 DIAGNOSIS — R2689 Other abnormalities of gait and mobility: Secondary | ICD-10-CM | POA: Diagnosis not present

## 2016-03-14 DIAGNOSIS — J189 Pneumonia, unspecified organism: Secondary | ICD-10-CM | POA: Diagnosis not present

## 2016-03-15 DIAGNOSIS — R2689 Other abnormalities of gait and mobility: Secondary | ICD-10-CM | POA: Diagnosis not present

## 2016-03-15 DIAGNOSIS — J189 Pneumonia, unspecified organism: Secondary | ICD-10-CM | POA: Diagnosis not present

## 2016-03-17 DIAGNOSIS — J189 Pneumonia, unspecified organism: Secondary | ICD-10-CM | POA: Diagnosis not present

## 2016-03-17 DIAGNOSIS — R2689 Other abnormalities of gait and mobility: Secondary | ICD-10-CM | POA: Diagnosis not present

## 2016-03-18 DIAGNOSIS — J189 Pneumonia, unspecified organism: Secondary | ICD-10-CM | POA: Diagnosis not present

## 2016-03-18 DIAGNOSIS — R2689 Other abnormalities of gait and mobility: Secondary | ICD-10-CM | POA: Diagnosis not present

## 2016-03-19 ENCOUNTER — Ambulatory Visit (HOSPITAL_COMMUNITY)
Admission: RE | Admit: 2016-03-19 | Discharge: 2016-03-19 | Disposition: A | Discharge status: Home / Self Care | Payer: Medicare Other | Source: Ambulatory Visit | Attending: Internal Medicine | Admitting: Internal Medicine

## 2016-03-19 DIAGNOSIS — R51 Headache: Secondary | ICD-10-CM | POA: Diagnosis not present

## 2016-03-19 DIAGNOSIS — Z86718 Personal history of other venous thrombosis and embolism: Secondary | ICD-10-CM | POA: Diagnosis not present

## 2016-03-19 DIAGNOSIS — I252 Old myocardial infarction: Secondary | ICD-10-CM | POA: Diagnosis not present

## 2016-03-19 DIAGNOSIS — G319 Degenerative disease of nervous system, unspecified: Secondary | ICD-10-CM | POA: Insufficient documentation

## 2016-03-19 DIAGNOSIS — I236 Thrombosis of atrium, auricular appendage, and ventricle as current complications following acute myocardial infarction: Secondary | ICD-10-CM

## 2016-03-20 DIAGNOSIS — J189 Pneumonia, unspecified organism: Secondary | ICD-10-CM | POA: Diagnosis not present

## 2016-03-20 DIAGNOSIS — R2689 Other abnormalities of gait and mobility: Secondary | ICD-10-CM | POA: Diagnosis not present

## 2016-03-20 DIAGNOSIS — D649 Anemia, unspecified: Secondary | ICD-10-CM | POA: Diagnosis not present

## 2016-03-21 DIAGNOSIS — R2689 Other abnormalities of gait and mobility: Secondary | ICD-10-CM | POA: Diagnosis not present

## 2016-03-21 DIAGNOSIS — J189 Pneumonia, unspecified organism: Secondary | ICD-10-CM | POA: Diagnosis not present

## 2016-03-22 DIAGNOSIS — J189 Pneumonia, unspecified organism: Secondary | ICD-10-CM | POA: Diagnosis not present

## 2016-03-22 DIAGNOSIS — R2689 Other abnormalities of gait and mobility: Secondary | ICD-10-CM | POA: Diagnosis not present

## 2016-03-24 DIAGNOSIS — D631 Anemia in chronic kidney disease: Secondary | ICD-10-CM | POA: Diagnosis not present

## 2016-03-24 DIAGNOSIS — I10 Essential (primary) hypertension: Secondary | ICD-10-CM | POA: Diagnosis not present

## 2016-03-24 DIAGNOSIS — E119 Type 2 diabetes mellitus without complications: Secondary | ICD-10-CM | POA: Diagnosis not present

## 2016-03-24 DIAGNOSIS — J189 Pneumonia, unspecified organism: Secondary | ICD-10-CM | POA: Diagnosis not present

## 2016-03-24 DIAGNOSIS — R2689 Other abnormalities of gait and mobility: Secondary | ICD-10-CM | POA: Diagnosis not present

## 2016-03-24 DIAGNOSIS — K219 Gastro-esophageal reflux disease without esophagitis: Secondary | ICD-10-CM | POA: Diagnosis not present

## 2016-03-25 DIAGNOSIS — J189 Pneumonia, unspecified organism: Secondary | ICD-10-CM | POA: Diagnosis not present

## 2016-03-25 DIAGNOSIS — R2689 Other abnormalities of gait and mobility: Secondary | ICD-10-CM | POA: Diagnosis not present

## 2016-03-26 DIAGNOSIS — R2689 Other abnormalities of gait and mobility: Secondary | ICD-10-CM | POA: Diagnosis not present

## 2016-03-26 DIAGNOSIS — J189 Pneumonia, unspecified organism: Secondary | ICD-10-CM | POA: Diagnosis not present

## 2016-03-27 DIAGNOSIS — J189 Pneumonia, unspecified organism: Secondary | ICD-10-CM | POA: Diagnosis not present

## 2016-03-27 DIAGNOSIS — R2689 Other abnormalities of gait and mobility: Secondary | ICD-10-CM | POA: Diagnosis not present

## 2016-03-28 DIAGNOSIS — R2689 Other abnormalities of gait and mobility: Secondary | ICD-10-CM | POA: Diagnosis not present

## 2016-03-28 DIAGNOSIS — J189 Pneumonia, unspecified organism: Secondary | ICD-10-CM | POA: Diagnosis not present

## 2016-03-30 DIAGNOSIS — J189 Pneumonia, unspecified organism: Secondary | ICD-10-CM | POA: Diagnosis not present

## 2016-03-30 DIAGNOSIS — R2689 Other abnormalities of gait and mobility: Secondary | ICD-10-CM | POA: Diagnosis not present

## 2016-03-31 DIAGNOSIS — J189 Pneumonia, unspecified organism: Secondary | ICD-10-CM | POA: Diagnosis not present

## 2016-03-31 DIAGNOSIS — R2689 Other abnormalities of gait and mobility: Secondary | ICD-10-CM | POA: Diagnosis not present

## 2016-04-01 DIAGNOSIS — R2689 Other abnormalities of gait and mobility: Secondary | ICD-10-CM | POA: Diagnosis not present

## 2016-04-01 DIAGNOSIS — J189 Pneumonia, unspecified organism: Secondary | ICD-10-CM | POA: Diagnosis not present

## 2016-04-02 DIAGNOSIS — R2689 Other abnormalities of gait and mobility: Secondary | ICD-10-CM | POA: Diagnosis not present

## 2016-04-02 DIAGNOSIS — J189 Pneumonia, unspecified organism: Secondary | ICD-10-CM | POA: Diagnosis not present

## 2016-04-03 DIAGNOSIS — R2689 Other abnormalities of gait and mobility: Secondary | ICD-10-CM | POA: Diagnosis not present

## 2016-04-03 DIAGNOSIS — J189 Pneumonia, unspecified organism: Secondary | ICD-10-CM | POA: Diagnosis not present

## 2016-04-04 DIAGNOSIS — R2689 Other abnormalities of gait and mobility: Secondary | ICD-10-CM | POA: Diagnosis not present

## 2016-04-04 DIAGNOSIS — J189 Pneumonia, unspecified organism: Secondary | ICD-10-CM | POA: Diagnosis not present

## 2016-04-07 DIAGNOSIS — J189 Pneumonia, unspecified organism: Secondary | ICD-10-CM | POA: Diagnosis not present

## 2016-04-07 DIAGNOSIS — R2689 Other abnormalities of gait and mobility: Secondary | ICD-10-CM | POA: Diagnosis not present

## 2016-04-08 DIAGNOSIS — R2689 Other abnormalities of gait and mobility: Secondary | ICD-10-CM | POA: Diagnosis not present

## 2016-04-08 DIAGNOSIS — J189 Pneumonia, unspecified organism: Secondary | ICD-10-CM | POA: Diagnosis not present

## 2016-04-09 DIAGNOSIS — R2689 Other abnormalities of gait and mobility: Secondary | ICD-10-CM | POA: Diagnosis not present

## 2016-04-09 DIAGNOSIS — J189 Pneumonia, unspecified organism: Secondary | ICD-10-CM | POA: Diagnosis not present

## 2016-04-10 ENCOUNTER — Encounter: Payer: Self-pay | Admitting: Cardiovascular Disease

## 2016-04-10 DIAGNOSIS — R2689 Other abnormalities of gait and mobility: Secondary | ICD-10-CM | POA: Diagnosis not present

## 2016-04-10 DIAGNOSIS — J189 Pneumonia, unspecified organism: Secondary | ICD-10-CM | POA: Diagnosis not present

## 2016-04-11 DIAGNOSIS — R0602 Shortness of breath: Secondary | ICD-10-CM | POA: Diagnosis not present

## 2016-04-11 DIAGNOSIS — R2689 Other abnormalities of gait and mobility: Secondary | ICD-10-CM | POA: Diagnosis not present

## 2016-04-11 DIAGNOSIS — J189 Pneumonia, unspecified organism: Secondary | ICD-10-CM | POA: Diagnosis not present

## 2016-04-11 DIAGNOSIS — I509 Heart failure, unspecified: Secondary | ICD-10-CM | POA: Diagnosis not present

## 2016-04-12 DIAGNOSIS — R2689 Other abnormalities of gait and mobility: Secondary | ICD-10-CM | POA: Diagnosis not present

## 2016-04-12 DIAGNOSIS — J189 Pneumonia, unspecified organism: Secondary | ICD-10-CM | POA: Diagnosis not present

## 2016-04-14 DIAGNOSIS — R2689 Other abnormalities of gait and mobility: Secondary | ICD-10-CM | POA: Diagnosis not present

## 2016-04-14 DIAGNOSIS — E119 Type 2 diabetes mellitus without complications: Secondary | ICD-10-CM | POA: Diagnosis not present

## 2016-04-14 DIAGNOSIS — J189 Pneumonia, unspecified organism: Secondary | ICD-10-CM | POA: Diagnosis not present

## 2016-04-15 DIAGNOSIS — R062 Wheezing: Secondary | ICD-10-CM | POA: Diagnosis not present

## 2016-04-15 DIAGNOSIS — J189 Pneumonia, unspecified organism: Secondary | ICD-10-CM | POA: Diagnosis not present

## 2016-04-15 DIAGNOSIS — R05 Cough: Secondary | ICD-10-CM | POA: Diagnosis not present

## 2016-04-15 DIAGNOSIS — R2689 Other abnormalities of gait and mobility: Secondary | ICD-10-CM | POA: Diagnosis not present

## 2016-04-15 DIAGNOSIS — R0981 Nasal congestion: Secondary | ICD-10-CM | POA: Diagnosis not present

## 2016-04-16 DIAGNOSIS — J189 Pneumonia, unspecified organism: Secondary | ICD-10-CM | POA: Diagnosis not present

## 2016-04-16 DIAGNOSIS — R2689 Other abnormalities of gait and mobility: Secondary | ICD-10-CM | POA: Diagnosis not present

## 2016-04-17 DIAGNOSIS — R2689 Other abnormalities of gait and mobility: Secondary | ICD-10-CM | POA: Diagnosis not present

## 2016-04-17 DIAGNOSIS — J189 Pneumonia, unspecified organism: Secondary | ICD-10-CM | POA: Diagnosis not present

## 2016-04-18 DIAGNOSIS — J189 Pneumonia, unspecified organism: Secondary | ICD-10-CM | POA: Diagnosis not present

## 2016-04-18 DIAGNOSIS — R2689 Other abnormalities of gait and mobility: Secondary | ICD-10-CM | POA: Diagnosis not present

## 2016-04-19 DIAGNOSIS — R2689 Other abnormalities of gait and mobility: Secondary | ICD-10-CM | POA: Diagnosis not present

## 2016-04-19 DIAGNOSIS — J189 Pneumonia, unspecified organism: Secondary | ICD-10-CM | POA: Diagnosis not present

## 2016-04-21 DIAGNOSIS — R2689 Other abnormalities of gait and mobility: Secondary | ICD-10-CM | POA: Diagnosis not present

## 2016-04-21 DIAGNOSIS — J189 Pneumonia, unspecified organism: Secondary | ICD-10-CM | POA: Diagnosis not present

## 2016-04-22 DIAGNOSIS — R062 Wheezing: Secondary | ICD-10-CM | POA: Diagnosis not present

## 2016-04-22 DIAGNOSIS — R2689 Other abnormalities of gait and mobility: Secondary | ICD-10-CM | POA: Diagnosis not present

## 2016-04-22 DIAGNOSIS — R21 Rash and other nonspecific skin eruption: Secondary | ICD-10-CM | POA: Diagnosis not present

## 2016-04-22 DIAGNOSIS — J189 Pneumonia, unspecified organism: Secondary | ICD-10-CM | POA: Diagnosis not present

## 2016-04-22 DIAGNOSIS — R05 Cough: Secondary | ICD-10-CM | POA: Diagnosis not present

## 2016-04-22 DIAGNOSIS — B369 Superficial mycosis, unspecified: Secondary | ICD-10-CM | POA: Diagnosis not present

## 2016-04-23 DIAGNOSIS — R2689 Other abnormalities of gait and mobility: Secondary | ICD-10-CM | POA: Diagnosis not present

## 2016-04-23 DIAGNOSIS — J189 Pneumonia, unspecified organism: Secondary | ICD-10-CM | POA: Diagnosis not present

## 2016-04-24 DIAGNOSIS — R2689 Other abnormalities of gait and mobility: Secondary | ICD-10-CM | POA: Diagnosis not present

## 2016-04-24 DIAGNOSIS — J189 Pneumonia, unspecified organism: Secondary | ICD-10-CM | POA: Diagnosis not present

## 2016-04-25 ENCOUNTER — Inpatient Hospital Stay (HOSPITAL_COMMUNITY): Admission: RE | Admit: 2016-04-25 | Payer: Self-pay | Source: Ambulatory Visit

## 2016-04-25 DIAGNOSIS — J189 Pneumonia, unspecified organism: Secondary | ICD-10-CM | POA: Diagnosis not present

## 2016-04-25 DIAGNOSIS — R2689 Other abnormalities of gait and mobility: Secondary | ICD-10-CM | POA: Diagnosis not present

## 2016-04-26 DIAGNOSIS — J189 Pneumonia, unspecified organism: Secondary | ICD-10-CM | POA: Diagnosis not present

## 2016-04-26 DIAGNOSIS — R2689 Other abnormalities of gait and mobility: Secondary | ICD-10-CM | POA: Diagnosis not present

## 2016-04-27 DIAGNOSIS — R2689 Other abnormalities of gait and mobility: Secondary | ICD-10-CM | POA: Diagnosis not present

## 2016-04-27 DIAGNOSIS — J189 Pneumonia, unspecified organism: Secondary | ICD-10-CM | POA: Diagnosis not present

## 2016-04-28 ENCOUNTER — Other Ambulatory Visit: Payer: Self-pay | Admitting: Cardiology

## 2016-04-28 DIAGNOSIS — R2689 Other abnormalities of gait and mobility: Secondary | ICD-10-CM | POA: Diagnosis not present

## 2016-04-28 DIAGNOSIS — J189 Pneumonia, unspecified organism: Secondary | ICD-10-CM | POA: Diagnosis not present

## 2016-04-29 DIAGNOSIS — I429 Cardiomyopathy, unspecified: Secondary | ICD-10-CM | POA: Diagnosis not present

## 2016-04-29 DIAGNOSIS — E559 Vitamin D deficiency, unspecified: Secondary | ICD-10-CM | POA: Diagnosis not present

## 2016-04-29 DIAGNOSIS — N390 Urinary tract infection, site not specified: Secondary | ICD-10-CM | POA: Diagnosis not present

## 2016-04-29 DIAGNOSIS — E119 Type 2 diabetes mellitus without complications: Secondary | ICD-10-CM | POA: Diagnosis not present

## 2016-04-29 DIAGNOSIS — I10 Essential (primary) hypertension: Secondary | ICD-10-CM | POA: Diagnosis not present

## 2016-05-01 DIAGNOSIS — E785 Hyperlipidemia, unspecified: Secondary | ICD-10-CM | POA: Diagnosis not present

## 2016-05-01 DIAGNOSIS — J9611 Chronic respiratory failure with hypoxia: Secondary | ICD-10-CM | POA: Diagnosis not present

## 2016-05-01 DIAGNOSIS — Z87440 Personal history of urinary (tract) infections: Secondary | ICD-10-CM | POA: Diagnosis not present

## 2016-05-01 DIAGNOSIS — E1140 Type 2 diabetes mellitus with diabetic neuropathy, unspecified: Secondary | ICD-10-CM | POA: Diagnosis not present

## 2016-05-01 DIAGNOSIS — N183 Chronic kidney disease, stage 3 (moderate): Secondary | ICD-10-CM | POA: Diagnosis not present

## 2016-05-01 DIAGNOSIS — I5042 Chronic combined systolic (congestive) and diastolic (congestive) heart failure: Secondary | ICD-10-CM | POA: Diagnosis not present

## 2016-05-01 DIAGNOSIS — E1122 Type 2 diabetes mellitus with diabetic chronic kidney disease: Secondary | ICD-10-CM | POA: Diagnosis not present

## 2016-05-01 DIAGNOSIS — I255 Ischemic cardiomyopathy: Secondary | ICD-10-CM | POA: Diagnosis not present

## 2016-05-01 DIAGNOSIS — I2584 Coronary atherosclerosis due to calcified coronary lesion: Secondary | ICD-10-CM | POA: Diagnosis not present

## 2016-05-01 DIAGNOSIS — I130 Hypertensive heart and chronic kidney disease with heart failure and stage 1 through stage 4 chronic kidney disease, or unspecified chronic kidney disease: Secondary | ICD-10-CM | POA: Diagnosis not present

## 2016-05-01 DIAGNOSIS — I2510 Atherosclerotic heart disease of native coronary artery without angina pectoris: Secondary | ICD-10-CM | POA: Diagnosis not present

## 2016-05-01 DIAGNOSIS — I252 Old myocardial infarction: Secondary | ICD-10-CM | POA: Diagnosis not present

## 2016-05-05 DIAGNOSIS — E1140 Type 2 diabetes mellitus with diabetic neuropathy, unspecified: Secondary | ICD-10-CM | POA: Diagnosis not present

## 2016-05-05 DIAGNOSIS — I255 Ischemic cardiomyopathy: Secondary | ICD-10-CM | POA: Diagnosis not present

## 2016-05-05 DIAGNOSIS — I2584 Coronary atherosclerosis due to calcified coronary lesion: Secondary | ICD-10-CM | POA: Diagnosis not present

## 2016-05-05 DIAGNOSIS — J9611 Chronic respiratory failure with hypoxia: Secondary | ICD-10-CM | POA: Diagnosis not present

## 2016-05-05 DIAGNOSIS — N183 Chronic kidney disease, stage 3 (moderate): Secondary | ICD-10-CM | POA: Diagnosis not present

## 2016-05-05 DIAGNOSIS — E1122 Type 2 diabetes mellitus with diabetic chronic kidney disease: Secondary | ICD-10-CM | POA: Diagnosis not present

## 2016-05-05 DIAGNOSIS — I2510 Atherosclerotic heart disease of native coronary artery without angina pectoris: Secondary | ICD-10-CM | POA: Diagnosis not present

## 2016-05-05 DIAGNOSIS — I252 Old myocardial infarction: Secondary | ICD-10-CM | POA: Diagnosis not present

## 2016-05-05 DIAGNOSIS — E785 Hyperlipidemia, unspecified: Secondary | ICD-10-CM | POA: Diagnosis not present

## 2016-05-05 DIAGNOSIS — I130 Hypertensive heart and chronic kidney disease with heart failure and stage 1 through stage 4 chronic kidney disease, or unspecified chronic kidney disease: Secondary | ICD-10-CM | POA: Diagnosis not present

## 2016-05-05 DIAGNOSIS — I5042 Chronic combined systolic (congestive) and diastolic (congestive) heart failure: Secondary | ICD-10-CM | POA: Diagnosis not present

## 2016-05-05 DIAGNOSIS — Z87440 Personal history of urinary (tract) infections: Secondary | ICD-10-CM | POA: Diagnosis not present

## 2016-05-06 DIAGNOSIS — I255 Ischemic cardiomyopathy: Secondary | ICD-10-CM | POA: Diagnosis not present

## 2016-05-06 DIAGNOSIS — Z0000 Encounter for general adult medical examination without abnormal findings: Secondary | ICD-10-CM | POA: Diagnosis not present

## 2016-05-06 DIAGNOSIS — E785 Hyperlipidemia, unspecified: Secondary | ICD-10-CM | POA: Diagnosis not present

## 2016-05-06 DIAGNOSIS — I5042 Chronic combined systolic (congestive) and diastolic (congestive) heart failure: Secondary | ICD-10-CM | POA: Diagnosis not present

## 2016-05-06 DIAGNOSIS — Z5181 Encounter for therapeutic drug level monitoring: Secondary | ICD-10-CM | POA: Diagnosis not present

## 2016-05-06 DIAGNOSIS — I130 Hypertensive heart and chronic kidney disease with heart failure and stage 1 through stage 4 chronic kidney disease, or unspecified chronic kidney disease: Secondary | ICD-10-CM | POA: Diagnosis not present

## 2016-05-06 DIAGNOSIS — Z7901 Long term (current) use of anticoagulants: Secondary | ICD-10-CM | POA: Diagnosis not present

## 2016-05-06 DIAGNOSIS — E119 Type 2 diabetes mellitus without complications: Secondary | ICD-10-CM | POA: Diagnosis not present

## 2016-05-06 DIAGNOSIS — M25569 Pain in unspecified knee: Secondary | ICD-10-CM | POA: Diagnosis not present

## 2016-05-06 DIAGNOSIS — I252 Old myocardial infarction: Secondary | ICD-10-CM | POA: Diagnosis not present

## 2016-05-06 DIAGNOSIS — Z87440 Personal history of urinary (tract) infections: Secondary | ICD-10-CM | POA: Diagnosis not present

## 2016-05-06 DIAGNOSIS — I2584 Coronary atherosclerosis due to calcified coronary lesion: Secondary | ICD-10-CM | POA: Diagnosis not present

## 2016-05-06 DIAGNOSIS — I2510 Atherosclerotic heart disease of native coronary artery without angina pectoris: Secondary | ICD-10-CM | POA: Diagnosis not present

## 2016-05-06 DIAGNOSIS — J9611 Chronic respiratory failure with hypoxia: Secondary | ICD-10-CM | POA: Diagnosis not present

## 2016-05-06 DIAGNOSIS — M549 Dorsalgia, unspecified: Secondary | ICD-10-CM | POA: Diagnosis not present

## 2016-05-06 DIAGNOSIS — E1122 Type 2 diabetes mellitus with diabetic chronic kidney disease: Secondary | ICD-10-CM | POA: Diagnosis not present

## 2016-05-06 DIAGNOSIS — M545 Low back pain: Secondary | ICD-10-CM | POA: Diagnosis not present

## 2016-05-06 DIAGNOSIS — Z79899 Other long term (current) drug therapy: Secondary | ICD-10-CM | POA: Diagnosis not present

## 2016-05-06 DIAGNOSIS — N390 Urinary tract infection, site not specified: Secondary | ICD-10-CM | POA: Diagnosis not present

## 2016-05-06 DIAGNOSIS — N183 Chronic kidney disease, stage 3 (moderate): Secondary | ICD-10-CM | POA: Diagnosis not present

## 2016-05-06 DIAGNOSIS — E1140 Type 2 diabetes mellitus with diabetic neuropathy, unspecified: Secondary | ICD-10-CM | POA: Diagnosis not present

## 2016-05-08 DIAGNOSIS — Z87440 Personal history of urinary (tract) infections: Secondary | ICD-10-CM | POA: Diagnosis not present

## 2016-05-08 DIAGNOSIS — I255 Ischemic cardiomyopathy: Secondary | ICD-10-CM | POA: Diagnosis not present

## 2016-05-08 DIAGNOSIS — E1140 Type 2 diabetes mellitus with diabetic neuropathy, unspecified: Secondary | ICD-10-CM | POA: Diagnosis not present

## 2016-05-08 DIAGNOSIS — E1122 Type 2 diabetes mellitus with diabetic chronic kidney disease: Secondary | ICD-10-CM | POA: Diagnosis not present

## 2016-05-08 DIAGNOSIS — I130 Hypertensive heart and chronic kidney disease with heart failure and stage 1 through stage 4 chronic kidney disease, or unspecified chronic kidney disease: Secondary | ICD-10-CM | POA: Diagnosis not present

## 2016-05-08 DIAGNOSIS — I2584 Coronary atherosclerosis due to calcified coronary lesion: Secondary | ICD-10-CM | POA: Diagnosis not present

## 2016-05-08 DIAGNOSIS — E785 Hyperlipidemia, unspecified: Secondary | ICD-10-CM | POA: Diagnosis not present

## 2016-05-08 DIAGNOSIS — J9611 Chronic respiratory failure with hypoxia: Secondary | ICD-10-CM | POA: Diagnosis not present

## 2016-05-08 DIAGNOSIS — N183 Chronic kidney disease, stage 3 (moderate): Secondary | ICD-10-CM | POA: Diagnosis not present

## 2016-05-08 DIAGNOSIS — I2510 Atherosclerotic heart disease of native coronary artery without angina pectoris: Secondary | ICD-10-CM | POA: Diagnosis not present

## 2016-05-08 DIAGNOSIS — I5042 Chronic combined systolic (congestive) and diastolic (congestive) heart failure: Secondary | ICD-10-CM | POA: Diagnosis not present

## 2016-05-08 DIAGNOSIS — I252 Old myocardial infarction: Secondary | ICD-10-CM | POA: Diagnosis not present

## 2016-05-12 DIAGNOSIS — R0602 Shortness of breath: Secondary | ICD-10-CM | POA: Diagnosis not present

## 2016-05-12 DIAGNOSIS — I5042 Chronic combined systolic (congestive) and diastolic (congestive) heart failure: Secondary | ICD-10-CM | POA: Diagnosis not present

## 2016-05-12 DIAGNOSIS — E1140 Type 2 diabetes mellitus with diabetic neuropathy, unspecified: Secondary | ICD-10-CM | POA: Diagnosis not present

## 2016-05-12 DIAGNOSIS — Z87440 Personal history of urinary (tract) infections: Secondary | ICD-10-CM | POA: Diagnosis not present

## 2016-05-12 DIAGNOSIS — I509 Heart failure, unspecified: Secondary | ICD-10-CM | POA: Diagnosis not present

## 2016-05-12 DIAGNOSIS — I2510 Atherosclerotic heart disease of native coronary artery without angina pectoris: Secondary | ICD-10-CM | POA: Diagnosis not present

## 2016-05-12 DIAGNOSIS — E1122 Type 2 diabetes mellitus with diabetic chronic kidney disease: Secondary | ICD-10-CM | POA: Diagnosis not present

## 2016-05-12 DIAGNOSIS — E785 Hyperlipidemia, unspecified: Secondary | ICD-10-CM | POA: Diagnosis not present

## 2016-05-12 DIAGNOSIS — I252 Old myocardial infarction: Secondary | ICD-10-CM | POA: Diagnosis not present

## 2016-05-12 DIAGNOSIS — N183 Chronic kidney disease, stage 3 (moderate): Secondary | ICD-10-CM | POA: Diagnosis not present

## 2016-05-12 DIAGNOSIS — J9611 Chronic respiratory failure with hypoxia: Secondary | ICD-10-CM | POA: Diagnosis not present

## 2016-05-12 DIAGNOSIS — I130 Hypertensive heart and chronic kidney disease with heart failure and stage 1 through stage 4 chronic kidney disease, or unspecified chronic kidney disease: Secondary | ICD-10-CM | POA: Diagnosis not present

## 2016-05-12 DIAGNOSIS — I255 Ischemic cardiomyopathy: Secondary | ICD-10-CM | POA: Diagnosis not present

## 2016-05-12 DIAGNOSIS — I2584 Coronary atherosclerosis due to calcified coronary lesion: Secondary | ICD-10-CM | POA: Diagnosis not present

## 2016-05-13 DIAGNOSIS — Z87440 Personal history of urinary (tract) infections: Secondary | ICD-10-CM | POA: Diagnosis not present

## 2016-05-13 DIAGNOSIS — E1140 Type 2 diabetes mellitus with diabetic neuropathy, unspecified: Secondary | ICD-10-CM | POA: Diagnosis not present

## 2016-05-13 DIAGNOSIS — I252 Old myocardial infarction: Secondary | ICD-10-CM | POA: Diagnosis not present

## 2016-05-13 DIAGNOSIS — N183 Chronic kidney disease, stage 3 (moderate): Secondary | ICD-10-CM | POA: Diagnosis not present

## 2016-05-13 DIAGNOSIS — E1122 Type 2 diabetes mellitus with diabetic chronic kidney disease: Secondary | ICD-10-CM | POA: Diagnosis not present

## 2016-05-13 DIAGNOSIS — I255 Ischemic cardiomyopathy: Secondary | ICD-10-CM | POA: Diagnosis not present

## 2016-05-13 DIAGNOSIS — I2510 Atherosclerotic heart disease of native coronary artery without angina pectoris: Secondary | ICD-10-CM | POA: Diagnosis not present

## 2016-05-13 DIAGNOSIS — J9611 Chronic respiratory failure with hypoxia: Secondary | ICD-10-CM | POA: Diagnosis not present

## 2016-05-13 DIAGNOSIS — E785 Hyperlipidemia, unspecified: Secondary | ICD-10-CM | POA: Diagnosis not present

## 2016-05-13 DIAGNOSIS — I5042 Chronic combined systolic (congestive) and diastolic (congestive) heart failure: Secondary | ICD-10-CM | POA: Diagnosis not present

## 2016-05-13 DIAGNOSIS — I2584 Coronary atherosclerosis due to calcified coronary lesion: Secondary | ICD-10-CM | POA: Diagnosis not present

## 2016-05-13 DIAGNOSIS — I130 Hypertensive heart and chronic kidney disease with heart failure and stage 1 through stage 4 chronic kidney disease, or unspecified chronic kidney disease: Secondary | ICD-10-CM | POA: Diagnosis not present

## 2016-05-14 DIAGNOSIS — I2584 Coronary atherosclerosis due to calcified coronary lesion: Secondary | ICD-10-CM | POA: Diagnosis not present

## 2016-05-14 DIAGNOSIS — J9611 Chronic respiratory failure with hypoxia: Secondary | ICD-10-CM | POA: Diagnosis not present

## 2016-05-14 DIAGNOSIS — I5042 Chronic combined systolic (congestive) and diastolic (congestive) heart failure: Secondary | ICD-10-CM | POA: Diagnosis not present

## 2016-05-14 DIAGNOSIS — N183 Chronic kidney disease, stage 3 (moderate): Secondary | ICD-10-CM | POA: Diagnosis not present

## 2016-05-14 DIAGNOSIS — E1122 Type 2 diabetes mellitus with diabetic chronic kidney disease: Secondary | ICD-10-CM | POA: Diagnosis not present

## 2016-05-14 DIAGNOSIS — Z87440 Personal history of urinary (tract) infections: Secondary | ICD-10-CM | POA: Diagnosis not present

## 2016-05-14 DIAGNOSIS — E1140 Type 2 diabetes mellitus with diabetic neuropathy, unspecified: Secondary | ICD-10-CM | POA: Diagnosis not present

## 2016-05-14 DIAGNOSIS — I252 Old myocardial infarction: Secondary | ICD-10-CM | POA: Diagnosis not present

## 2016-05-14 DIAGNOSIS — I255 Ischemic cardiomyopathy: Secondary | ICD-10-CM | POA: Diagnosis not present

## 2016-05-14 DIAGNOSIS — I2510 Atherosclerotic heart disease of native coronary artery without angina pectoris: Secondary | ICD-10-CM | POA: Diagnosis not present

## 2016-05-14 DIAGNOSIS — I130 Hypertensive heart and chronic kidney disease with heart failure and stage 1 through stage 4 chronic kidney disease, or unspecified chronic kidney disease: Secondary | ICD-10-CM | POA: Diagnosis not present

## 2016-05-14 DIAGNOSIS — E785 Hyperlipidemia, unspecified: Secondary | ICD-10-CM | POA: Diagnosis not present

## 2016-05-15 ENCOUNTER — Telehealth (HOSPITAL_COMMUNITY): Payer: Self-pay | Admitting: Cardiology

## 2016-05-15 ENCOUNTER — Ambulatory Visit (HOSPITAL_COMMUNITY)
Admission: RE | Admit: 2016-05-15 | Discharge: 2016-05-15 | Disposition: A | Discharge status: Home / Self Care | Payer: Medicare Other | Source: Ambulatory Visit | Attending: Internal Medicine | Admitting: Internal Medicine

## 2016-05-15 VITALS — BP 120/68 | HR 81 | Wt 144.0 lb

## 2016-05-15 DIAGNOSIS — H3530 Unspecified macular degeneration: Secondary | ICD-10-CM | POA: Diagnosis not present

## 2016-05-15 DIAGNOSIS — E784 Other hyperlipidemia: Secondary | ICD-10-CM

## 2016-05-15 DIAGNOSIS — N183 Chronic kidney disease, stage 3 unspecified: Secondary | ICD-10-CM

## 2016-05-15 DIAGNOSIS — M545 Low back pain: Secondary | ICD-10-CM | POA: Diagnosis not present

## 2016-05-15 DIAGNOSIS — I5022 Chronic systolic (congestive) heart failure: Secondary | ICD-10-CM

## 2016-05-15 DIAGNOSIS — I236 Thrombosis of atrium, auricular appendage, and ventricle as current complications following acute myocardial infarction: Secondary | ICD-10-CM | POA: Diagnosis not present

## 2016-05-15 DIAGNOSIS — I130 Hypertensive heart and chronic kidney disease with heart failure and stage 1 through stage 4 chronic kidney disease, or unspecified chronic kidney disease: Secondary | ICD-10-CM | POA: Diagnosis not present

## 2016-05-15 DIAGNOSIS — I2510 Atherosclerotic heart disease of native coronary artery without angina pectoris: Secondary | ICD-10-CM | POA: Insufficient documentation

## 2016-05-15 DIAGNOSIS — I253 Aneurysm of heart: Secondary | ICD-10-CM | POA: Diagnosis not present

## 2016-05-15 DIAGNOSIS — M1990 Unspecified osteoarthritis, unspecified site: Secondary | ICD-10-CM | POA: Diagnosis not present

## 2016-05-15 DIAGNOSIS — I2129 ST elevation (STEMI) myocardial infarction involving other sites: Secondary | ICD-10-CM

## 2016-05-15 DIAGNOSIS — E7800 Pure hypercholesterolemia, unspecified: Secondary | ICD-10-CM | POA: Insufficient documentation

## 2016-05-15 DIAGNOSIS — I255 Ischemic cardiomyopathy: Secondary | ICD-10-CM | POA: Diagnosis not present

## 2016-05-15 DIAGNOSIS — Z7901 Long term (current) use of anticoagulants: Secondary | ICD-10-CM | POA: Insufficient documentation

## 2016-05-15 DIAGNOSIS — I10 Essential (primary) hypertension: Secondary | ICD-10-CM | POA: Diagnosis not present

## 2016-05-15 DIAGNOSIS — Z8249 Family history of ischemic heart disease and other diseases of the circulatory system: Secondary | ICD-10-CM | POA: Diagnosis not present

## 2016-05-15 DIAGNOSIS — I509 Heart failure, unspecified: Secondary | ICD-10-CM

## 2016-05-15 DIAGNOSIS — E7849 Other hyperlipidemia: Secondary | ICD-10-CM

## 2016-05-15 LAB — BASIC METABOLIC PANEL
ANION GAP: 9 (ref 5–15)
BUN: 45 mg/dL — ABNORMAL HIGH (ref 6–20)
CALCIUM: 9 mg/dL (ref 8.9–10.3)
CO2: 30 mmol/L (ref 22–32)
CREATININE: 1.59 mg/dL — AB (ref 0.44–1.00)
Chloride: 103 mmol/L (ref 101–111)
GFR, EST AFRICAN AMERICAN: 32 mL/min — AB (ref >60)
GFR, EST NON AFRICAN AMERICAN: 28 mL/min — AB (ref >60)
GLUCOSE: 183 mg/dL — AB (ref 65–99)
Potassium: 3.7 mmol/L (ref 3.5–5.1)
Sodium: 142 mmol/L (ref 135–145)

## 2016-05-15 MED ORDER — ISOSORBIDE MONONITRATE ER 60 MG PO TB24: 60.0000 mg | ORAL_TABLET | Freq: Every day | ORAL | 6 refills | Status: AC

## 2016-05-15 MED ORDER — FUROSEMIDE 20 MG PO TABS: 40.0000 mg | ORAL_TABLET | Freq: Every day | ORAL | 6 refills | Status: DC

## 2016-05-15 NOTE — Progress Notes (Signed)
Patient ID: [name], female   DOB: Apr 28, 1925, 83 y.o.   MRN: [id number]    Advanced Heart Failure Clinic Note   PCP: Dr. Maudie Mercury Cardiology: Dr. Aundra Dubin  Ms Tuckerman is an 83 yo with history of CAD, ischemic CMP and apical aneurysm with LV thrombus presents for cardiology followup. Last echo in 7/13 showed EF 25-30% with apical aneurysm.  Lexiscan myoview in 7/13 showed a large fixed inferolateral defect and a large predominantly fixed apical defect. EF 27% There was no significant ischemia and she was managed medically.    Seen by EP in 4/15 and decided against having an ICD generator change.  Cardiolite in 6/16 showed large apical scar but no ischemia.    Noted mass RUQ, had CT in 8/17 showing eventration of abdominal wall containing gallbladder fundus and ascending colon. No pain.   Admitted 11/15/ through 02/07/17 with UTI/PNA/and deconditioning.   Today she returns for HF follow up. Discharged from rehab February 5th. She has returned home. She has 4 hour care. Overall feeling pretty good. Mild dyspnea with exertion. Denies PND/Orthopnea. Uses oxygen at night. Appetite improving. Weight at home 141 pounds. Taking all medications. She has [place] nurse.    Labs (12/11): K 4.1, creatinine 1.1 Labs (4/12): K 4.6, creatinine 1.2, BUN 43, BNP 271 Labs (6/12): K 4, creatinine 1.3, LDL 66, HDL 44 Labs (10/12): K 4.8, creatinine 1.3 Labs (3/13): K 4.7, creatinine 1.4 Labs (4/13): K 4.6, creatinine 1.22, LDL 71, HDL 45 Labs (7/13): K 4.2, creatinine 1.2 Labs (10/13): K 3.8, creatinine 1.1, BNP 171 Labs (11/13): K 4, creatinine 1.2, LDL 80, HDL 46 Labs (1/14): K 3.9, creatinine 1.2 Labs (2/14): LDL 72, HDL 50 Labs (5/14): K 4, creatinine 1.5, BNP 214 Labs (9/14): K 4.4, creatinine 1.4, BNP 220, LDL 82, HDL 44 Labs (10/14): K 3.7, creatinine 1.5 Labs (1/15): K 4.3, creatinine 1.44, BNP 944 Labs (7/15): creatinine 1.5, LFTs normal, hemoglobin 12.5, LDL 95, HDL 42 Labs (11/17/13): creatinine 1.8,  K+ 4.4,  Labs (12/15): K+ 4.8 creatinine 1.32 LDL 76 HDL 44 Labs (9/16): K 4.5, creatinine 1.65, hgb 11.5, LDL 88, HDL 40 Labs (11/16): K 4.7, creatinine 1.93 Labs (5/17): K 4, creatinine 1.18 Labs (6/17): LDL 76, HDL 35 Labs (8/17): K 3.6, creatinine 1.2, hgb 11.6  Allergies (verified):  1)  ! Codeine  Past Medical History: 1. HYPERCHOLESTEROLEMIA  2. ARTHRITIS: Severe low back pain.  3. ISCHEMIC CARDIOMYOPATHY: Echo (1/10) with EF 25-30%, mildly dilated LV, periapical LV aneurysm, calcified LV thrombus, moderate MR.  Medtronic ICD set to VVI.  Echo (7/12) with EF 25-30%, mid-apical anteroseptal, apical lateral, apical anterior akinesis and aneurysm of the true apex; no thrombus noted, moderate MR, PA systolic pressure 30 mmHg.  Echo 07/19/14  EF 25-30% with akinesis of distal half of LV, no apical clot, RV ok, moderate MR.  4. CAD: Anterior MI in 2006 with Cypher DES to LAD.  Lexiscan myoview (7/13) with large fixed inferolateral defect and large predominantly fixed apical defect.  There was minimal ischemia so she was managed medically.  Lexiscan Cardiolite (6/16) with EF 19%, large fixed apical defect, no ischemia.  5. HYPERTENSION 6. LV thrombus on coumadin.  7. Macular degeneration 8. CKD 9. Palpitations: Holter (9/14) with PACs and PVCs, no atrial fibrillation.  10. BPPV 11. Eventration of abdominal wall containing fundus of gallbladder and ascending colon.  Noted by CT in 8/17.   Social History: The patient lives alone near [place]. 3 daughters live nearby.  Widowed Tobacco Use - No.  Alcohol Use - no  Family History:  CAD  ROS:  All systems reviewed and negative except as per HPI.    Current Outpatient Prescriptions  Medication Sig Dispense Refill  . acetaminophen (TYLENOL) 325 MG tablet Take 325-650 mg by mouth every 6 (six) hours as needed for mild pain.     Marland Kitchen atorvastatin (LIPITOR) 40 MG tablet Take 1 tablet (40 mg total) by mouth daily at 6 PM. 30 tablet 4  .  carvedilol (COREG) 25 MG tablet Take 1 Tablet by mouth 2 times a day 180 tablet 3  . cholecalciferol (VITAMIN D) 1000 UNITS tablet Take 1,000 Units by mouth daily.      . folic acid (FOLVITE) 1 MG tablet Take 1 mg by mouth daily.    . furosemide (LASIX) 40 MG tablet Take 1 tablet (40 mg total) by mouth 2 (two) times daily. 45 tablet 3  . gabapentin (NEURONTIN) 100 MG capsule 1 capsule 3 (three) times daily.    . isosorbide mononitrate (IMDUR) 30 MG 24 hr tablet Take 1 tablet (30 mg total) by mouth daily.    Marland Kitchen losartan (COZAAR) 50 MG tablet Take 1 Tablet by mouth once daily 30 tablet 3  . nitroGLYCERIN (NITROSTAT) 0.4 MG SL tablet Place 1 tablet (0.4 mg total) under the tongue every 5 (five) minutes as needed for chest pain. 25 tablet 3  . pantoprazole (PROTONIX) 40 MG tablet Take 1 Tablet by mouth once daily 90 tablet 3  . polyethylene glycol (MIRALAX / GLYCOLAX) packet Take 17 g by mouth daily. 14 each 0  . saxagliptin HCl (ONGLYZA) 2.5 MG TABS tablet Take 2.5 mg by mouth daily.    . sertraline (ZOLOFT) 50 MG tablet Take 50 mg by mouth daily.     Marland Kitchen spironolactone (ALDACTONE) 25 MG tablet Take 1 Tablet by mouth once daily 90 tablet 3  . traMADol (ULTRAM) 50 MG tablet Take 1 tablet (50 mg total) by mouth every 6 (six) hours as needed for moderate pain. 10 tablet 0  . warfarin (COUMADIN) 2.5 MG tablet TAKE AS DIRECTED BY ANTICOAGULATION CLINIC 30 tablet 3   No current facility-administered medications for this encounter.     BP 120/68   Pulse 81   Wt 144 lb (65.3 kg)   SpO2 (!) 89%   BMI 29.08 kg/m    Wt Readings from Last 3 Encounters:  05/15/16 144 lb (65.3 kg)  02/06/16 143 lb 15.4 oz (65.3 kg)  11/30/15 143 lb 1.9 oz (64.9 kg)    General; Elderly. NAD. Ambulated in the clinic with a rolling walker.Daughter present.  Neck:  Neck supple, JVP 5-6 cm  Lungs:  CTAB. On room air Heart:  Non-displaced PMI, chest non-tender; RRR, S1, S2 without rubs or gallops. 1/6 early SEM at RUSB.   Carotid upstroke normal, no bruit.   Abdomen:  Soft, NT, ND, no HSM appreciated. +BS. Large round mass on right side.  Non-tender. Extremities:  No clubbing or cyanosis.  Warm. No edema.   Neurologic:  Alert and oriented x 3. Psych:  Normal affect.  Assessment/Plan:  1. CAD Stable. Taking SL ntg + imdur.    - Continue atorvastatin. Inrease imdur to 60 mg daily.  - No ASA given stable CAD and on warfarin.  2. HYPERLIPIDEMIA - Continue statin.  - Recent LDL stable. Check at next visit.  3. Chronic systolic CHF  Ischemic cardiomyopathy.  EF 25-30% on last echo, 19% by Cardiolite.  NYHA II-III. Volume status stable. Continue current dose of lasix, spiro, losartan and coreg  BMET today.  4. H/o LV apical aneurysm with thrombus History of apical aneurysm with thrombus, thrombus not seen on last echo.No bleeding problems.  Continue warfarin.  Dosing per coumadin clinic.  5. HTN Stable. Continue current regimen.  6 CKD stage 3 BMET today.  7. R sided mass CT 11/13/15 eventration of the abdominal wall containing GB and ascending colon.  - Scheduled to see surgery at the end month. Watchful waiting.   Follow up in 6 months.   Darrick Grinder, NP-C  05/15/2016

## 2016-05-15 NOTE — Patient Instructions (Addendum)
Routine lab work today. Will notify you of abnormal results, otherwise no news is good news!  INCREASE Imdur to 60 mg once daily. Can "double up" on your current 30 mg tablets (Take 2 tabs once daily). New Rx has been sent to your pharmacy for 60 mg tablets (Take 1 tablet once daily).  Follow up with Dr. Aundra Dubin in 6 months.  Do the following things EVERYDAY: 1) Weigh yourself in the morning before breakfast. Write it down and keep it in a log. 2) Take your medicines as prescribed 3) Eat low salt foods-Limit salt (sodium) to 2000 mg per day.  4) Stay as active as you can everyday 5) Limit all fluids for the day to less than 2 liters

## 2016-05-15 NOTE — Telephone Encounter (Signed)
Patient aware. [phone] (H)

## 2016-05-15 NOTE — Telephone Encounter (Signed)
-----   Message from Conrad Rosston, NP sent at 05/15/2016  4:06 PM EST ----- Renal function elevated. Please call and ask her to hold lasix for 2 days then start lasix 40 mg daily. Repeat BMET in 1 week.

## 2016-05-20 ENCOUNTER — Telehealth (HOSPITAL_COMMUNITY): Payer: Self-pay | Admitting: *Deleted

## 2016-05-20 DIAGNOSIS — Z87440 Personal history of urinary (tract) infections: Secondary | ICD-10-CM | POA: Diagnosis not present

## 2016-05-20 DIAGNOSIS — I252 Old myocardial infarction: Secondary | ICD-10-CM | POA: Diagnosis not present

## 2016-05-20 DIAGNOSIS — I130 Hypertensive heart and chronic kidney disease with heart failure and stage 1 through stage 4 chronic kidney disease, or unspecified chronic kidney disease: Secondary | ICD-10-CM | POA: Diagnosis not present

## 2016-05-20 DIAGNOSIS — E1140 Type 2 diabetes mellitus with diabetic neuropathy, unspecified: Secondary | ICD-10-CM | POA: Diagnosis not present

## 2016-05-20 DIAGNOSIS — I2510 Atherosclerotic heart disease of native coronary artery without angina pectoris: Secondary | ICD-10-CM | POA: Diagnosis not present

## 2016-05-20 DIAGNOSIS — E785 Hyperlipidemia, unspecified: Secondary | ICD-10-CM | POA: Diagnosis not present

## 2016-05-20 DIAGNOSIS — I5042 Chronic combined systolic (congestive) and diastolic (congestive) heart failure: Secondary | ICD-10-CM | POA: Diagnosis not present

## 2016-05-20 DIAGNOSIS — J9611 Chronic respiratory failure with hypoxia: Secondary | ICD-10-CM | POA: Diagnosis not present

## 2016-05-20 DIAGNOSIS — I255 Ischemic cardiomyopathy: Secondary | ICD-10-CM | POA: Diagnosis not present

## 2016-05-20 DIAGNOSIS — N183 Chronic kidney disease, stage 3 (moderate): Secondary | ICD-10-CM | POA: Diagnosis not present

## 2016-05-20 DIAGNOSIS — E1122 Type 2 diabetes mellitus with diabetic chronic kidney disease: Secondary | ICD-10-CM | POA: Diagnosis not present

## 2016-05-20 DIAGNOSIS — I2584 Coronary atherosclerosis due to calcified coronary lesion: Secondary | ICD-10-CM | POA: Diagnosis not present

## 2016-05-20 MED ORDER — FUROSEMIDE 40 MG PO TABS: 40.0000 mg | ORAL_TABLET | Freq: Two times a day (BID) | ORAL | 3 refills | Status: AC

## 2016-05-20 NOTE — Telephone Encounter (Signed)
Anderson Malta returned call, she is aware of changes and will notify pt and caregiver.  She request order be faxed to Rudene Christians at [phone], order faxed

## 2016-05-20 NOTE — Telephone Encounter (Signed)
[name], PT with [facility] called to report patient in having increased SOB with 3 lb weight gain since this past Friday.  Patient is having to wear oxygen 24-7 verses just at night.  Dr. Aundra Dubin advises patient to increase lasix to 60 mg in the AM and 40 mg in the PM for 3 days only then start taking 40 mg BID.  Also wants BMET drawn in 1 week.   Called patient's home health nurse [name] @ [phone] and left detailed message with these orders.  I also told [name] who updated the medication changes with patient.  No further questions at this time.

## 2016-05-21 DIAGNOSIS — E1140 Type 2 diabetes mellitus with diabetic neuropathy, unspecified: Secondary | ICD-10-CM | POA: Diagnosis not present

## 2016-05-21 DIAGNOSIS — I2510 Atherosclerotic heart disease of native coronary artery without angina pectoris: Secondary | ICD-10-CM | POA: Diagnosis not present

## 2016-05-21 DIAGNOSIS — I130 Hypertensive heart and chronic kidney disease with heart failure and stage 1 through stage 4 chronic kidney disease, or unspecified chronic kidney disease: Secondary | ICD-10-CM | POA: Diagnosis not present

## 2016-05-21 DIAGNOSIS — I2584 Coronary atherosclerosis due to calcified coronary lesion: Secondary | ICD-10-CM | POA: Diagnosis not present

## 2016-05-21 DIAGNOSIS — E1122 Type 2 diabetes mellitus with diabetic chronic kidney disease: Secondary | ICD-10-CM | POA: Diagnosis not present

## 2016-05-21 DIAGNOSIS — J9611 Chronic respiratory failure with hypoxia: Secondary | ICD-10-CM | POA: Diagnosis not present

## 2016-05-21 DIAGNOSIS — E785 Hyperlipidemia, unspecified: Secondary | ICD-10-CM | POA: Diagnosis not present

## 2016-05-21 DIAGNOSIS — N183 Chronic kidney disease, stage 3 (moderate): Secondary | ICD-10-CM | POA: Diagnosis not present

## 2016-05-21 DIAGNOSIS — I5042 Chronic combined systolic (congestive) and diastolic (congestive) heart failure: Secondary | ICD-10-CM | POA: Diagnosis not present

## 2016-05-21 DIAGNOSIS — I252 Old myocardial infarction: Secondary | ICD-10-CM | POA: Diagnosis not present

## 2016-05-21 DIAGNOSIS — I255 Ischemic cardiomyopathy: Secondary | ICD-10-CM | POA: Diagnosis not present

## 2016-05-21 DIAGNOSIS — Z87440 Personal history of urinary (tract) infections: Secondary | ICD-10-CM | POA: Diagnosis not present

## 2016-05-22 DIAGNOSIS — Z87440 Personal history of urinary (tract) infections: Secondary | ICD-10-CM | POA: Diagnosis not present

## 2016-05-22 DIAGNOSIS — I2584 Coronary atherosclerosis due to calcified coronary lesion: Secondary | ICD-10-CM | POA: Diagnosis not present

## 2016-05-22 DIAGNOSIS — E785 Hyperlipidemia, unspecified: Secondary | ICD-10-CM | POA: Diagnosis not present

## 2016-05-22 DIAGNOSIS — J9611 Chronic respiratory failure with hypoxia: Secondary | ICD-10-CM | POA: Diagnosis not present

## 2016-05-22 DIAGNOSIS — I5042 Chronic combined systolic (congestive) and diastolic (congestive) heart failure: Secondary | ICD-10-CM | POA: Diagnosis not present

## 2016-05-22 DIAGNOSIS — I2510 Atherosclerotic heart disease of native coronary artery without angina pectoris: Secondary | ICD-10-CM | POA: Diagnosis not present

## 2016-05-22 DIAGNOSIS — E1122 Type 2 diabetes mellitus with diabetic chronic kidney disease: Secondary | ICD-10-CM | POA: Diagnosis not present

## 2016-05-22 DIAGNOSIS — I252 Old myocardial infarction: Secondary | ICD-10-CM | POA: Diagnosis not present

## 2016-05-22 DIAGNOSIS — E1140 Type 2 diabetes mellitus with diabetic neuropathy, unspecified: Secondary | ICD-10-CM | POA: Diagnosis not present

## 2016-05-22 DIAGNOSIS — N183 Chronic kidney disease, stage 3 (moderate): Secondary | ICD-10-CM | POA: Diagnosis not present

## 2016-05-22 DIAGNOSIS — I255 Ischemic cardiomyopathy: Secondary | ICD-10-CM | POA: Diagnosis not present

## 2016-05-22 DIAGNOSIS — I130 Hypertensive heart and chronic kidney disease with heart failure and stage 1 through stage 4 chronic kidney disease, or unspecified chronic kidney disease: Secondary | ICD-10-CM | POA: Diagnosis not present

## 2016-05-23 ENCOUNTER — Other Ambulatory Visit (HOSPITAL_COMMUNITY): Payer: Self-pay

## 2016-05-23 DIAGNOSIS — Z87440 Personal history of urinary (tract) infections: Secondary | ICD-10-CM | POA: Diagnosis not present

## 2016-05-23 DIAGNOSIS — I255 Ischemic cardiomyopathy: Secondary | ICD-10-CM | POA: Diagnosis not present

## 2016-05-23 DIAGNOSIS — I130 Hypertensive heart and chronic kidney disease with heart failure and stage 1 through stage 4 chronic kidney disease, or unspecified chronic kidney disease: Secondary | ICD-10-CM | POA: Diagnosis not present

## 2016-05-23 DIAGNOSIS — J9611 Chronic respiratory failure with hypoxia: Secondary | ICD-10-CM | POA: Diagnosis not present

## 2016-05-23 DIAGNOSIS — E1122 Type 2 diabetes mellitus with diabetic chronic kidney disease: Secondary | ICD-10-CM | POA: Diagnosis not present

## 2016-05-23 DIAGNOSIS — E1140 Type 2 diabetes mellitus with diabetic neuropathy, unspecified: Secondary | ICD-10-CM | POA: Diagnosis not present

## 2016-05-23 DIAGNOSIS — I5042 Chronic combined systolic (congestive) and diastolic (congestive) heart failure: Secondary | ICD-10-CM | POA: Diagnosis not present

## 2016-05-23 DIAGNOSIS — I252 Old myocardial infarction: Secondary | ICD-10-CM | POA: Diagnosis not present

## 2016-05-23 DIAGNOSIS — I2510 Atherosclerotic heart disease of native coronary artery without angina pectoris: Secondary | ICD-10-CM | POA: Diagnosis not present

## 2016-05-23 DIAGNOSIS — I2584 Coronary atherosclerosis due to calcified coronary lesion: Secondary | ICD-10-CM | POA: Diagnosis not present

## 2016-05-23 DIAGNOSIS — E785 Hyperlipidemia, unspecified: Secondary | ICD-10-CM | POA: Diagnosis not present

## 2016-05-23 DIAGNOSIS — N183 Chronic kidney disease, stage 3 (moderate): Secondary | ICD-10-CM | POA: Diagnosis not present

## 2016-05-26 DIAGNOSIS — I2510 Atherosclerotic heart disease of native coronary artery without angina pectoris: Secondary | ICD-10-CM | POA: Diagnosis not present

## 2016-05-26 DIAGNOSIS — I130 Hypertensive heart and chronic kidney disease with heart failure and stage 1 through stage 4 chronic kidney disease, or unspecified chronic kidney disease: Secondary | ICD-10-CM | POA: Diagnosis not present

## 2016-05-26 DIAGNOSIS — I2584 Coronary atherosclerosis due to calcified coronary lesion: Secondary | ICD-10-CM | POA: Diagnosis not present

## 2016-05-26 DIAGNOSIS — N183 Chronic kidney disease, stage 3 (moderate): Secondary | ICD-10-CM | POA: Diagnosis not present

## 2016-05-26 DIAGNOSIS — E785 Hyperlipidemia, unspecified: Secondary | ICD-10-CM | POA: Diagnosis not present

## 2016-05-26 DIAGNOSIS — I255 Ischemic cardiomyopathy: Secondary | ICD-10-CM | POA: Diagnosis not present

## 2016-05-26 DIAGNOSIS — I5042 Chronic combined systolic (congestive) and diastolic (congestive) heart failure: Secondary | ICD-10-CM | POA: Diagnosis not present

## 2016-05-26 DIAGNOSIS — I252 Old myocardial infarction: Secondary | ICD-10-CM | POA: Diagnosis not present

## 2016-05-26 DIAGNOSIS — E1122 Type 2 diabetes mellitus with diabetic chronic kidney disease: Secondary | ICD-10-CM | POA: Diagnosis not present

## 2016-05-26 DIAGNOSIS — E1140 Type 2 diabetes mellitus with diabetic neuropathy, unspecified: Secondary | ICD-10-CM | POA: Diagnosis not present

## 2016-05-26 DIAGNOSIS — Z87440 Personal history of urinary (tract) infections: Secondary | ICD-10-CM | POA: Diagnosis not present

## 2016-05-26 DIAGNOSIS — J9611 Chronic respiratory failure with hypoxia: Secondary | ICD-10-CM | POA: Diagnosis not present

## 2016-05-27 DIAGNOSIS — I252 Old myocardial infarction: Secondary | ICD-10-CM | POA: Diagnosis not present

## 2016-05-27 DIAGNOSIS — I2584 Coronary atherosclerosis due to calcified coronary lesion: Secondary | ICD-10-CM | POA: Diagnosis not present

## 2016-05-27 DIAGNOSIS — E785 Hyperlipidemia, unspecified: Secondary | ICD-10-CM | POA: Diagnosis not present

## 2016-05-27 DIAGNOSIS — I5042 Chronic combined systolic (congestive) and diastolic (congestive) heart failure: Secondary | ICD-10-CM | POA: Diagnosis not present

## 2016-05-27 DIAGNOSIS — I255 Ischemic cardiomyopathy: Secondary | ICD-10-CM | POA: Diagnosis not present

## 2016-05-27 DIAGNOSIS — I2510 Atherosclerotic heart disease of native coronary artery without angina pectoris: Secondary | ICD-10-CM | POA: Diagnosis not present

## 2016-05-27 DIAGNOSIS — I130 Hypertensive heart and chronic kidney disease with heart failure and stage 1 through stage 4 chronic kidney disease, or unspecified chronic kidney disease: Secondary | ICD-10-CM | POA: Diagnosis not present

## 2016-05-27 DIAGNOSIS — Z87440 Personal history of urinary (tract) infections: Secondary | ICD-10-CM | POA: Diagnosis not present

## 2016-05-27 DIAGNOSIS — E1122 Type 2 diabetes mellitus with diabetic chronic kidney disease: Secondary | ICD-10-CM | POA: Diagnosis not present

## 2016-05-27 DIAGNOSIS — J9611 Chronic respiratory failure with hypoxia: Secondary | ICD-10-CM | POA: Diagnosis not present

## 2016-05-27 DIAGNOSIS — N183 Chronic kidney disease, stage 3 (moderate): Secondary | ICD-10-CM | POA: Diagnosis not present

## 2016-05-27 DIAGNOSIS — E1140 Type 2 diabetes mellitus with diabetic neuropathy, unspecified: Secondary | ICD-10-CM | POA: Diagnosis not present

## 2016-05-28 ENCOUNTER — Encounter (HOSPITAL_COMMUNITY): Payer: Self-pay

## 2016-05-28 DIAGNOSIS — Z87440 Personal history of urinary (tract) infections: Secondary | ICD-10-CM | POA: Diagnosis not present

## 2016-05-28 DIAGNOSIS — E1140 Type 2 diabetes mellitus with diabetic neuropathy, unspecified: Secondary | ICD-10-CM | POA: Diagnosis not present

## 2016-05-28 DIAGNOSIS — E785 Hyperlipidemia, unspecified: Secondary | ICD-10-CM | POA: Diagnosis not present

## 2016-05-28 DIAGNOSIS — I130 Hypertensive heart and chronic kidney disease with heart failure and stage 1 through stage 4 chronic kidney disease, or unspecified chronic kidney disease: Secondary | ICD-10-CM | POA: Diagnosis not present

## 2016-05-28 DIAGNOSIS — I255 Ischemic cardiomyopathy: Secondary | ICD-10-CM | POA: Diagnosis not present

## 2016-05-28 DIAGNOSIS — J9611 Chronic respiratory failure with hypoxia: Secondary | ICD-10-CM | POA: Diagnosis not present

## 2016-05-28 DIAGNOSIS — E1122 Type 2 diabetes mellitus with diabetic chronic kidney disease: Secondary | ICD-10-CM | POA: Diagnosis not present

## 2016-05-28 DIAGNOSIS — I5042 Chronic combined systolic (congestive) and diastolic (congestive) heart failure: Secondary | ICD-10-CM | POA: Diagnosis not present

## 2016-05-28 DIAGNOSIS — I2510 Atherosclerotic heart disease of native coronary artery without angina pectoris: Secondary | ICD-10-CM | POA: Diagnosis not present

## 2016-05-28 DIAGNOSIS — N183 Chronic kidney disease, stage 3 (moderate): Secondary | ICD-10-CM | POA: Diagnosis not present

## 2016-05-28 DIAGNOSIS — I2584 Coronary atherosclerosis due to calcified coronary lesion: Secondary | ICD-10-CM | POA: Diagnosis not present

## 2016-05-28 DIAGNOSIS — I252 Old myocardial infarction: Secondary | ICD-10-CM | POA: Diagnosis not present

## 2016-05-29 ENCOUNTER — Ambulatory Visit (INDEPENDENT_AMBULATORY_CARE_PROVIDER_SITE_OTHER): Payer: Medicare Other | Admitting: Interventional Cardiology

## 2016-05-29 ENCOUNTER — Telehealth (HOSPITAL_COMMUNITY): Payer: Self-pay | Admitting: *Deleted

## 2016-05-29 DIAGNOSIS — E785 Hyperlipidemia, unspecified: Secondary | ICD-10-CM | POA: Diagnosis not present

## 2016-05-29 DIAGNOSIS — Z5181 Encounter for therapeutic drug level monitoring: Secondary | ICD-10-CM

## 2016-05-29 DIAGNOSIS — E1122 Type 2 diabetes mellitus with diabetic chronic kidney disease: Secondary | ICD-10-CM | POA: Diagnosis not present

## 2016-05-29 DIAGNOSIS — E1140 Type 2 diabetes mellitus with diabetic neuropathy, unspecified: Secondary | ICD-10-CM | POA: Diagnosis not present

## 2016-05-29 DIAGNOSIS — I2584 Coronary atherosclerosis due to calcified coronary lesion: Secondary | ICD-10-CM | POA: Diagnosis not present

## 2016-05-29 DIAGNOSIS — I252 Old myocardial infarction: Secondary | ICD-10-CM | POA: Diagnosis not present

## 2016-05-29 DIAGNOSIS — Z87440 Personal history of urinary (tract) infections: Secondary | ICD-10-CM | POA: Diagnosis not present

## 2016-05-29 DIAGNOSIS — N183 Chronic kidney disease, stage 3 (moderate): Secondary | ICD-10-CM | POA: Diagnosis not present

## 2016-05-29 DIAGNOSIS — I255 Ischemic cardiomyopathy: Secondary | ICD-10-CM | POA: Diagnosis not present

## 2016-05-29 DIAGNOSIS — I2510 Atherosclerotic heart disease of native coronary artery without angina pectoris: Secondary | ICD-10-CM | POA: Diagnosis not present

## 2016-05-29 DIAGNOSIS — I130 Hypertensive heart and chronic kidney disease with heart failure and stage 1 through stage 4 chronic kidney disease, or unspecified chronic kidney disease: Secondary | ICD-10-CM | POA: Diagnosis not present

## 2016-05-29 DIAGNOSIS — I5042 Chronic combined systolic (congestive) and diastolic (congestive) heart failure: Secondary | ICD-10-CM | POA: Diagnosis not present

## 2016-05-29 DIAGNOSIS — J9611 Chronic respiratory failure with hypoxia: Secondary | ICD-10-CM | POA: Diagnosis not present

## 2016-05-29 LAB — POCT INR: INR: 8

## 2016-05-29 NOTE — Telephone Encounter (Signed)
Anderson Malta, RN with [facility] called to report patient's INR was 8.0 and rechecked it at 7.9.  I advised her to call the coumadin clinic at [address] because they are the ones that manage the patient's coumadin.  She understands and no further questions at this time.

## 2016-06-03 ENCOUNTER — Ambulatory Visit (INDEPENDENT_AMBULATORY_CARE_PROVIDER_SITE_OTHER): Payer: Medicare Other | Admitting: Pharmacist

## 2016-06-03 DIAGNOSIS — I2584 Coronary atherosclerosis due to calcified coronary lesion: Secondary | ICD-10-CM | POA: Diagnosis not present

## 2016-06-03 DIAGNOSIS — E1140 Type 2 diabetes mellitus with diabetic neuropathy, unspecified: Secondary | ICD-10-CM | POA: Diagnosis not present

## 2016-06-03 DIAGNOSIS — Z5181 Encounter for therapeutic drug level monitoring: Secondary | ICD-10-CM

## 2016-06-03 DIAGNOSIS — J9611 Chronic respiratory failure with hypoxia: Secondary | ICD-10-CM | POA: Diagnosis not present

## 2016-06-03 DIAGNOSIS — I255 Ischemic cardiomyopathy: Secondary | ICD-10-CM | POA: Diagnosis not present

## 2016-06-03 DIAGNOSIS — I2510 Atherosclerotic heart disease of native coronary artery without angina pectoris: Secondary | ICD-10-CM | POA: Diagnosis not present

## 2016-06-03 DIAGNOSIS — I252 Old myocardial infarction: Secondary | ICD-10-CM | POA: Diagnosis not present

## 2016-06-03 DIAGNOSIS — E1122 Type 2 diabetes mellitus with diabetic chronic kidney disease: Secondary | ICD-10-CM | POA: Diagnosis not present

## 2016-06-03 DIAGNOSIS — Z87440 Personal history of urinary (tract) infections: Secondary | ICD-10-CM | POA: Diagnosis not present

## 2016-06-03 DIAGNOSIS — E785 Hyperlipidemia, unspecified: Secondary | ICD-10-CM | POA: Diagnosis not present

## 2016-06-03 DIAGNOSIS — I130 Hypertensive heart and chronic kidney disease with heart failure and stage 1 through stage 4 chronic kidney disease, or unspecified chronic kidney disease: Secondary | ICD-10-CM | POA: Diagnosis not present

## 2016-06-03 DIAGNOSIS — I5042 Chronic combined systolic (congestive) and diastolic (congestive) heart failure: Secondary | ICD-10-CM | POA: Diagnosis not present

## 2016-06-03 DIAGNOSIS — N183 Chronic kidney disease, stage 3 (moderate): Secondary | ICD-10-CM | POA: Diagnosis not present

## 2016-06-03 LAB — POCT INR: INR: 1.6

## 2016-06-05 DIAGNOSIS — I255 Ischemic cardiomyopathy: Secondary | ICD-10-CM | POA: Diagnosis not present

## 2016-06-05 DIAGNOSIS — N183 Chronic kidney disease, stage 3 (moderate): Secondary | ICD-10-CM | POA: Diagnosis not present

## 2016-06-05 DIAGNOSIS — I130 Hypertensive heart and chronic kidney disease with heart failure and stage 1 through stage 4 chronic kidney disease, or unspecified chronic kidney disease: Secondary | ICD-10-CM | POA: Diagnosis not present

## 2016-06-05 DIAGNOSIS — E1122 Type 2 diabetes mellitus with diabetic chronic kidney disease: Secondary | ICD-10-CM | POA: Diagnosis not present

## 2016-06-05 DIAGNOSIS — E785 Hyperlipidemia, unspecified: Secondary | ICD-10-CM | POA: Diagnosis not present

## 2016-06-05 DIAGNOSIS — I5042 Chronic combined systolic (congestive) and diastolic (congestive) heart failure: Secondary | ICD-10-CM | POA: Diagnosis not present

## 2016-06-05 DIAGNOSIS — I2510 Atherosclerotic heart disease of native coronary artery without angina pectoris: Secondary | ICD-10-CM | POA: Diagnosis not present

## 2016-06-05 DIAGNOSIS — I252 Old myocardial infarction: Secondary | ICD-10-CM | POA: Diagnosis not present

## 2016-06-05 DIAGNOSIS — Z87440 Personal history of urinary (tract) infections: Secondary | ICD-10-CM | POA: Diagnosis not present

## 2016-06-05 DIAGNOSIS — I2584 Coronary atherosclerosis due to calcified coronary lesion: Secondary | ICD-10-CM | POA: Diagnosis not present

## 2016-06-05 DIAGNOSIS — J9611 Chronic respiratory failure with hypoxia: Secondary | ICD-10-CM | POA: Diagnosis not present

## 2016-06-05 DIAGNOSIS — E1140 Type 2 diabetes mellitus with diabetic neuropathy, unspecified: Secondary | ICD-10-CM | POA: Diagnosis not present

## 2016-06-06 ENCOUNTER — Other Ambulatory Visit: Payer: Self-pay | Admitting: Cardiology

## 2016-06-06 DIAGNOSIS — I509 Heart failure, unspecified: Secondary | ICD-10-CM | POA: Diagnosis not present

## 2016-06-09 DIAGNOSIS — R0602 Shortness of breath: Secondary | ICD-10-CM | POA: Diagnosis not present

## 2016-06-09 DIAGNOSIS — I2584 Coronary atherosclerosis due to calcified coronary lesion: Secondary | ICD-10-CM | POA: Diagnosis not present

## 2016-06-09 DIAGNOSIS — E1122 Type 2 diabetes mellitus with diabetic chronic kidney disease: Secondary | ICD-10-CM | POA: Diagnosis not present

## 2016-06-09 DIAGNOSIS — I2510 Atherosclerotic heart disease of native coronary artery without angina pectoris: Secondary | ICD-10-CM | POA: Diagnosis not present

## 2016-06-09 DIAGNOSIS — I255 Ischemic cardiomyopathy: Secondary | ICD-10-CM | POA: Diagnosis not present

## 2016-06-09 DIAGNOSIS — E785 Hyperlipidemia, unspecified: Secondary | ICD-10-CM | POA: Diagnosis not present

## 2016-06-09 DIAGNOSIS — I5042 Chronic combined systolic (congestive) and diastolic (congestive) heart failure: Secondary | ICD-10-CM | POA: Diagnosis not present

## 2016-06-09 DIAGNOSIS — I252 Old myocardial infarction: Secondary | ICD-10-CM | POA: Diagnosis not present

## 2016-06-09 DIAGNOSIS — J9611 Chronic respiratory failure with hypoxia: Secondary | ICD-10-CM | POA: Diagnosis not present

## 2016-06-09 DIAGNOSIS — E1140 Type 2 diabetes mellitus with diabetic neuropathy, unspecified: Secondary | ICD-10-CM | POA: Diagnosis not present

## 2016-06-09 DIAGNOSIS — N183 Chronic kidney disease, stage 3 (moderate): Secondary | ICD-10-CM | POA: Diagnosis not present

## 2016-06-09 DIAGNOSIS — I130 Hypertensive heart and chronic kidney disease with heart failure and stage 1 through stage 4 chronic kidney disease, or unspecified chronic kidney disease: Secondary | ICD-10-CM | POA: Diagnosis not present

## 2016-06-09 DIAGNOSIS — Z87440 Personal history of urinary (tract) infections: Secondary | ICD-10-CM | POA: Diagnosis not present

## 2016-06-09 DIAGNOSIS — I509 Heart failure, unspecified: Secondary | ICD-10-CM | POA: Diagnosis not present

## 2016-06-10 DIAGNOSIS — I130 Hypertensive heart and chronic kidney disease with heart failure and stage 1 through stage 4 chronic kidney disease, or unspecified chronic kidney disease: Secondary | ICD-10-CM | POA: Diagnosis not present

## 2016-06-10 DIAGNOSIS — I252 Old myocardial infarction: Secondary | ICD-10-CM | POA: Diagnosis not present

## 2016-06-10 DIAGNOSIS — I2584 Coronary atherosclerosis due to calcified coronary lesion: Secondary | ICD-10-CM | POA: Diagnosis not present

## 2016-06-10 DIAGNOSIS — I2510 Atherosclerotic heart disease of native coronary artery without angina pectoris: Secondary | ICD-10-CM | POA: Diagnosis not present

## 2016-06-10 DIAGNOSIS — N183 Chronic kidney disease, stage 3 (moderate): Secondary | ICD-10-CM | POA: Diagnosis not present

## 2016-06-10 DIAGNOSIS — E785 Hyperlipidemia, unspecified: Secondary | ICD-10-CM | POA: Diagnosis not present

## 2016-06-10 DIAGNOSIS — E1122 Type 2 diabetes mellitus with diabetic chronic kidney disease: Secondary | ICD-10-CM | POA: Diagnosis not present

## 2016-06-10 DIAGNOSIS — I5042 Chronic combined systolic (congestive) and diastolic (congestive) heart failure: Secondary | ICD-10-CM | POA: Diagnosis not present

## 2016-06-10 DIAGNOSIS — I255 Ischemic cardiomyopathy: Secondary | ICD-10-CM | POA: Diagnosis not present

## 2016-06-10 DIAGNOSIS — E1140 Type 2 diabetes mellitus with diabetic neuropathy, unspecified: Secondary | ICD-10-CM | POA: Diagnosis not present

## 2016-06-10 DIAGNOSIS — Z87440 Personal history of urinary (tract) infections: Secondary | ICD-10-CM | POA: Diagnosis not present

## 2016-06-10 DIAGNOSIS — J9611 Chronic respiratory failure with hypoxia: Secondary | ICD-10-CM | POA: Diagnosis not present

## 2016-06-11 ENCOUNTER — Ambulatory Visit (INDEPENDENT_AMBULATORY_CARE_PROVIDER_SITE_OTHER): Payer: Medicare Other | Admitting: Cardiovascular Disease

## 2016-06-11 DIAGNOSIS — N183 Chronic kidney disease, stage 3 (moderate): Secondary | ICD-10-CM | POA: Diagnosis not present

## 2016-06-11 DIAGNOSIS — E1122 Type 2 diabetes mellitus with diabetic chronic kidney disease: Secondary | ICD-10-CM | POA: Diagnosis not present

## 2016-06-11 DIAGNOSIS — I130 Hypertensive heart and chronic kidney disease with heart failure and stage 1 through stage 4 chronic kidney disease, or unspecified chronic kidney disease: Secondary | ICD-10-CM | POA: Diagnosis not present

## 2016-06-11 DIAGNOSIS — Z5181 Encounter for therapeutic drug level monitoring: Secondary | ICD-10-CM

## 2016-06-11 DIAGNOSIS — I5042 Chronic combined systolic (congestive) and diastolic (congestive) heart failure: Secondary | ICD-10-CM | POA: Diagnosis not present

## 2016-06-11 DIAGNOSIS — I255 Ischemic cardiomyopathy: Secondary | ICD-10-CM | POA: Diagnosis not present

## 2016-06-11 DIAGNOSIS — I252 Old myocardial infarction: Secondary | ICD-10-CM | POA: Diagnosis not present

## 2016-06-11 DIAGNOSIS — E1140 Type 2 diabetes mellitus with diabetic neuropathy, unspecified: Secondary | ICD-10-CM | POA: Diagnosis not present

## 2016-06-11 DIAGNOSIS — J9611 Chronic respiratory failure with hypoxia: Secondary | ICD-10-CM | POA: Diagnosis not present

## 2016-06-11 DIAGNOSIS — Z87440 Personal history of urinary (tract) infections: Secondary | ICD-10-CM | POA: Diagnosis not present

## 2016-06-11 DIAGNOSIS — I2510 Atherosclerotic heart disease of native coronary artery without angina pectoris: Secondary | ICD-10-CM | POA: Diagnosis not present

## 2016-06-11 DIAGNOSIS — E785 Hyperlipidemia, unspecified: Secondary | ICD-10-CM | POA: Diagnosis not present

## 2016-06-11 DIAGNOSIS — I2584 Coronary atherosclerosis due to calcified coronary lesion: Secondary | ICD-10-CM | POA: Diagnosis not present

## 2016-06-11 LAB — POCT INR: INR: 1.4

## 2016-06-12 DIAGNOSIS — J9611 Chronic respiratory failure with hypoxia: Secondary | ICD-10-CM | POA: Diagnosis not present

## 2016-06-12 DIAGNOSIS — I255 Ischemic cardiomyopathy: Secondary | ICD-10-CM | POA: Diagnosis not present

## 2016-06-12 DIAGNOSIS — N183 Chronic kidney disease, stage 3 (moderate): Secondary | ICD-10-CM | POA: Diagnosis not present

## 2016-06-12 DIAGNOSIS — E1122 Type 2 diabetes mellitus with diabetic chronic kidney disease: Secondary | ICD-10-CM | POA: Diagnosis not present

## 2016-06-12 DIAGNOSIS — I2584 Coronary atherosclerosis due to calcified coronary lesion: Secondary | ICD-10-CM | POA: Diagnosis not present

## 2016-06-12 DIAGNOSIS — E1140 Type 2 diabetes mellitus with diabetic neuropathy, unspecified: Secondary | ICD-10-CM | POA: Diagnosis not present

## 2016-06-12 DIAGNOSIS — I2510 Atherosclerotic heart disease of native coronary artery without angina pectoris: Secondary | ICD-10-CM | POA: Diagnosis not present

## 2016-06-12 DIAGNOSIS — I130 Hypertensive heart and chronic kidney disease with heart failure and stage 1 through stage 4 chronic kidney disease, or unspecified chronic kidney disease: Secondary | ICD-10-CM | POA: Diagnosis not present

## 2016-06-12 DIAGNOSIS — I252 Old myocardial infarction: Secondary | ICD-10-CM | POA: Diagnosis not present

## 2016-06-12 DIAGNOSIS — Z87440 Personal history of urinary (tract) infections: Secondary | ICD-10-CM | POA: Diagnosis not present

## 2016-06-12 DIAGNOSIS — E785 Hyperlipidemia, unspecified: Secondary | ICD-10-CM | POA: Diagnosis not present

## 2016-06-12 DIAGNOSIS — I5042 Chronic combined systolic (congestive) and diastolic (congestive) heart failure: Secondary | ICD-10-CM | POA: Diagnosis not present

## 2016-06-16 DIAGNOSIS — I2510 Atherosclerotic heart disease of native coronary artery without angina pectoris: Secondary | ICD-10-CM | POA: Diagnosis not present

## 2016-06-16 DIAGNOSIS — I255 Ischemic cardiomyopathy: Secondary | ICD-10-CM | POA: Diagnosis not present

## 2016-06-16 DIAGNOSIS — I252 Old myocardial infarction: Secondary | ICD-10-CM | POA: Diagnosis not present

## 2016-06-16 DIAGNOSIS — E785 Hyperlipidemia, unspecified: Secondary | ICD-10-CM | POA: Diagnosis not present

## 2016-06-16 DIAGNOSIS — I5042 Chronic combined systolic (congestive) and diastolic (congestive) heart failure: Secondary | ICD-10-CM | POA: Diagnosis not present

## 2016-06-16 DIAGNOSIS — N183 Chronic kidney disease, stage 3 (moderate): Secondary | ICD-10-CM | POA: Diagnosis not present

## 2016-06-16 DIAGNOSIS — E1140 Type 2 diabetes mellitus with diabetic neuropathy, unspecified: Secondary | ICD-10-CM | POA: Diagnosis not present

## 2016-06-16 DIAGNOSIS — J9611 Chronic respiratory failure with hypoxia: Secondary | ICD-10-CM | POA: Diagnosis not present

## 2016-06-16 DIAGNOSIS — Z87440 Personal history of urinary (tract) infections: Secondary | ICD-10-CM | POA: Diagnosis not present

## 2016-06-16 DIAGNOSIS — I2584 Coronary atherosclerosis due to calcified coronary lesion: Secondary | ICD-10-CM | POA: Diagnosis not present

## 2016-06-16 DIAGNOSIS — E1122 Type 2 diabetes mellitus with diabetic chronic kidney disease: Secondary | ICD-10-CM | POA: Diagnosis not present

## 2016-06-16 DIAGNOSIS — I130 Hypertensive heart and chronic kidney disease with heart failure and stage 1 through stage 4 chronic kidney disease, or unspecified chronic kidney disease: Secondary | ICD-10-CM | POA: Diagnosis not present

## 2016-06-17 ENCOUNTER — Ambulatory Visit (INDEPENDENT_AMBULATORY_CARE_PROVIDER_SITE_OTHER): Payer: Medicare Other | Admitting: Cardiology

## 2016-06-17 DIAGNOSIS — I2584 Coronary atherosclerosis due to calcified coronary lesion: Secondary | ICD-10-CM | POA: Diagnosis not present

## 2016-06-17 DIAGNOSIS — Z87440 Personal history of urinary (tract) infections: Secondary | ICD-10-CM | POA: Diagnosis not present

## 2016-06-17 DIAGNOSIS — I252 Old myocardial infarction: Secondary | ICD-10-CM | POA: Diagnosis not present

## 2016-06-17 DIAGNOSIS — E785 Hyperlipidemia, unspecified: Secondary | ICD-10-CM | POA: Diagnosis not present

## 2016-06-17 DIAGNOSIS — N183 Chronic kidney disease, stage 3 (moderate): Secondary | ICD-10-CM | POA: Diagnosis not present

## 2016-06-17 DIAGNOSIS — I130 Hypertensive heart and chronic kidney disease with heart failure and stage 1 through stage 4 chronic kidney disease, or unspecified chronic kidney disease: Secondary | ICD-10-CM | POA: Diagnosis not present

## 2016-06-17 DIAGNOSIS — E1140 Type 2 diabetes mellitus with diabetic neuropathy, unspecified: Secondary | ICD-10-CM | POA: Diagnosis not present

## 2016-06-17 DIAGNOSIS — I2510 Atherosclerotic heart disease of native coronary artery without angina pectoris: Secondary | ICD-10-CM | POA: Diagnosis not present

## 2016-06-17 DIAGNOSIS — I5042 Chronic combined systolic (congestive) and diastolic (congestive) heart failure: Secondary | ICD-10-CM | POA: Diagnosis not present

## 2016-06-17 DIAGNOSIS — E1122 Type 2 diabetes mellitus with diabetic chronic kidney disease: Secondary | ICD-10-CM | POA: Diagnosis not present

## 2016-06-17 DIAGNOSIS — I255 Ischemic cardiomyopathy: Secondary | ICD-10-CM | POA: Diagnosis not present

## 2016-06-17 DIAGNOSIS — Z5181 Encounter for therapeutic drug level monitoring: Secondary | ICD-10-CM

## 2016-06-17 DIAGNOSIS — J9611 Chronic respiratory failure with hypoxia: Secondary | ICD-10-CM | POA: Diagnosis not present

## 2016-06-17 LAB — POCT INR: INR: 1.5

## 2016-06-18 DIAGNOSIS — Z87440 Personal history of urinary (tract) infections: Secondary | ICD-10-CM | POA: Diagnosis not present

## 2016-06-18 DIAGNOSIS — E1122 Type 2 diabetes mellitus with diabetic chronic kidney disease: Secondary | ICD-10-CM | POA: Diagnosis not present

## 2016-06-18 DIAGNOSIS — I255 Ischemic cardiomyopathy: Secondary | ICD-10-CM | POA: Diagnosis not present

## 2016-06-18 DIAGNOSIS — I252 Old myocardial infarction: Secondary | ICD-10-CM | POA: Diagnosis not present

## 2016-06-18 DIAGNOSIS — I2584 Coronary atherosclerosis due to calcified coronary lesion: Secondary | ICD-10-CM | POA: Diagnosis not present

## 2016-06-18 DIAGNOSIS — N183 Chronic kidney disease, stage 3 (moderate): Secondary | ICD-10-CM | POA: Diagnosis not present

## 2016-06-18 DIAGNOSIS — E1140 Type 2 diabetes mellitus with diabetic neuropathy, unspecified: Secondary | ICD-10-CM | POA: Diagnosis not present

## 2016-06-18 DIAGNOSIS — I130 Hypertensive heart and chronic kidney disease with heart failure and stage 1 through stage 4 chronic kidney disease, or unspecified chronic kidney disease: Secondary | ICD-10-CM | POA: Diagnosis not present

## 2016-06-18 DIAGNOSIS — J9611 Chronic respiratory failure with hypoxia: Secondary | ICD-10-CM | POA: Diagnosis not present

## 2016-06-18 DIAGNOSIS — I2510 Atherosclerotic heart disease of native coronary artery without angina pectoris: Secondary | ICD-10-CM | POA: Diagnosis not present

## 2016-06-18 DIAGNOSIS — I5042 Chronic combined systolic (congestive) and diastolic (congestive) heart failure: Secondary | ICD-10-CM | POA: Diagnosis not present

## 2016-06-18 DIAGNOSIS — E785 Hyperlipidemia, unspecified: Secondary | ICD-10-CM | POA: Diagnosis not present

## 2016-06-23 DIAGNOSIS — N183 Chronic kidney disease, stage 3 (moderate): Secondary | ICD-10-CM | POA: Diagnosis not present

## 2016-06-23 DIAGNOSIS — E1140 Type 2 diabetes mellitus with diabetic neuropathy, unspecified: Secondary | ICD-10-CM | POA: Diagnosis not present

## 2016-06-23 DIAGNOSIS — E785 Hyperlipidemia, unspecified: Secondary | ICD-10-CM | POA: Diagnosis not present

## 2016-06-23 DIAGNOSIS — I252 Old myocardial infarction: Secondary | ICD-10-CM | POA: Diagnosis not present

## 2016-06-23 DIAGNOSIS — J9611 Chronic respiratory failure with hypoxia: Secondary | ICD-10-CM | POA: Diagnosis not present

## 2016-06-23 DIAGNOSIS — I130 Hypertensive heart and chronic kidney disease with heart failure and stage 1 through stage 4 chronic kidney disease, or unspecified chronic kidney disease: Secondary | ICD-10-CM | POA: Diagnosis not present

## 2016-06-23 DIAGNOSIS — Z87440 Personal history of urinary (tract) infections: Secondary | ICD-10-CM | POA: Diagnosis not present

## 2016-06-23 DIAGNOSIS — I2510 Atherosclerotic heart disease of native coronary artery without angina pectoris: Secondary | ICD-10-CM | POA: Diagnosis not present

## 2016-06-23 DIAGNOSIS — I255 Ischemic cardiomyopathy: Secondary | ICD-10-CM | POA: Diagnosis not present

## 2016-06-23 DIAGNOSIS — E1122 Type 2 diabetes mellitus with diabetic chronic kidney disease: Secondary | ICD-10-CM | POA: Diagnosis not present

## 2016-06-23 DIAGNOSIS — I2584 Coronary atherosclerosis due to calcified coronary lesion: Secondary | ICD-10-CM | POA: Diagnosis not present

## 2016-06-23 DIAGNOSIS — I5042 Chronic combined systolic (congestive) and diastolic (congestive) heart failure: Secondary | ICD-10-CM | POA: Diagnosis not present

## 2016-06-24 ENCOUNTER — Ambulatory Visit (INDEPENDENT_AMBULATORY_CARE_PROVIDER_SITE_OTHER): Payer: Self-pay | Admitting: Internal Medicine

## 2016-06-24 DIAGNOSIS — I2510 Atherosclerotic heart disease of native coronary artery without angina pectoris: Secondary | ICD-10-CM | POA: Diagnosis not present

## 2016-06-24 DIAGNOSIS — N183 Chronic kidney disease, stage 3 (moderate): Secondary | ICD-10-CM | POA: Diagnosis not present

## 2016-06-24 DIAGNOSIS — I5042 Chronic combined systolic (congestive) and diastolic (congestive) heart failure: Secondary | ICD-10-CM | POA: Diagnosis not present

## 2016-06-24 DIAGNOSIS — E1140 Type 2 diabetes mellitus with diabetic neuropathy, unspecified: Secondary | ICD-10-CM | POA: Diagnosis not present

## 2016-06-24 DIAGNOSIS — J9611 Chronic respiratory failure with hypoxia: Secondary | ICD-10-CM | POA: Diagnosis not present

## 2016-06-24 DIAGNOSIS — I130 Hypertensive heart and chronic kidney disease with heart failure and stage 1 through stage 4 chronic kidney disease, or unspecified chronic kidney disease: Secondary | ICD-10-CM | POA: Diagnosis not present

## 2016-06-24 DIAGNOSIS — E1122 Type 2 diabetes mellitus with diabetic chronic kidney disease: Secondary | ICD-10-CM | POA: Diagnosis not present

## 2016-06-24 DIAGNOSIS — Z87440 Personal history of urinary (tract) infections: Secondary | ICD-10-CM | POA: Diagnosis not present

## 2016-06-24 DIAGNOSIS — I2584 Coronary atherosclerosis due to calcified coronary lesion: Secondary | ICD-10-CM | POA: Diagnosis not present

## 2016-06-24 DIAGNOSIS — I255 Ischemic cardiomyopathy: Secondary | ICD-10-CM | POA: Diagnosis not present

## 2016-06-24 DIAGNOSIS — Z5181 Encounter for therapeutic drug level monitoring: Secondary | ICD-10-CM

## 2016-06-24 DIAGNOSIS — E785 Hyperlipidemia, unspecified: Secondary | ICD-10-CM | POA: Diagnosis not present

## 2016-06-24 DIAGNOSIS — I252 Old myocardial infarction: Secondary | ICD-10-CM | POA: Diagnosis not present

## 2016-06-24 LAB — POCT INR: INR: 2.7

## 2016-06-25 ENCOUNTER — Encounter (HOSPITAL_COMMUNITY): Payer: Self-pay | Admitting: *Deleted

## 2016-06-25 ENCOUNTER — Emergency Department (HOSPITAL_COMMUNITY): Payer: Medicare Other

## 2016-06-25 ENCOUNTER — Observation Stay (HOSPITAL_COMMUNITY)
Admission: EM | Admit: 2016-06-25 | Discharge: 2016-06-27 | Disposition: A | Discharge status: Home / Self Care | Payer: Medicare Other | Attending: Internal Medicine | Admitting: Internal Medicine

## 2016-06-25 DIAGNOSIS — N183 Chronic kidney disease, stage 3 unspecified: Secondary | ICD-10-CM | POA: Diagnosis present

## 2016-06-25 DIAGNOSIS — N390 Urinary tract infection, site not specified: Secondary | ICD-10-CM | POA: Diagnosis not present

## 2016-06-25 DIAGNOSIS — Z7901 Long term (current) use of anticoagulants: Secondary | ICD-10-CM | POA: Diagnosis not present

## 2016-06-25 DIAGNOSIS — E785 Hyperlipidemia, unspecified: Secondary | ICD-10-CM | POA: Diagnosis not present

## 2016-06-25 DIAGNOSIS — M79661 Pain in right lower leg: Secondary | ICD-10-CM | POA: Diagnosis not present

## 2016-06-25 DIAGNOSIS — I252 Old myocardial infarction: Secondary | ICD-10-CM | POA: Diagnosis not present

## 2016-06-25 DIAGNOSIS — M79604 Pain in right leg: Secondary | ICD-10-CM | POA: Diagnosis not present

## 2016-06-25 DIAGNOSIS — I2510 Atherosclerotic heart disease of native coronary artery without angina pectoris: Secondary | ICD-10-CM | POA: Diagnosis present

## 2016-06-25 DIAGNOSIS — E1140 Type 2 diabetes mellitus with diabetic neuropathy, unspecified: Secondary | ICD-10-CM | POA: Diagnosis not present

## 2016-06-25 DIAGNOSIS — I130 Hypertensive heart and chronic kidney disease with heart failure and stage 1 through stage 4 chronic kidney disease, or unspecified chronic kidney disease: Secondary | ICD-10-CM | POA: Diagnosis not present

## 2016-06-25 DIAGNOSIS — I509 Heart failure, unspecified: Secondary | ICD-10-CM | POA: Diagnosis not present

## 2016-06-25 DIAGNOSIS — R42 Dizziness and giddiness: Secondary | ICD-10-CM | POA: Diagnosis not present

## 2016-06-25 DIAGNOSIS — R269 Unspecified abnormalities of gait and mobility: Secondary | ICD-10-CM

## 2016-06-25 DIAGNOSIS — E1122 Type 2 diabetes mellitus with diabetic chronic kidney disease: Secondary | ICD-10-CM | POA: Diagnosis not present

## 2016-06-25 DIAGNOSIS — R531 Weakness: Secondary | ICD-10-CM | POA: Diagnosis not present

## 2016-06-25 DIAGNOSIS — S299XXA Unspecified injury of thorax, initial encounter: Secondary | ICD-10-CM | POA: Diagnosis not present

## 2016-06-25 DIAGNOSIS — W1839XA Other fall on same level, initial encounter: Secondary | ICD-10-CM | POA: Insufficient documentation

## 2016-06-25 DIAGNOSIS — I5042 Chronic combined systolic (congestive) and diastolic (congestive) heart failure: Secondary | ICD-10-CM | POA: Diagnosis not present

## 2016-06-25 DIAGNOSIS — J9611 Chronic respiratory failure with hypoxia: Secondary | ICD-10-CM | POA: Diagnosis present

## 2016-06-25 DIAGNOSIS — Y999 Unspecified external cause status: Secondary | ICD-10-CM | POA: Diagnosis not present

## 2016-06-25 DIAGNOSIS — I10 Essential (primary) hypertension: Secondary | ICD-10-CM | POA: Diagnosis not present

## 2016-06-25 DIAGNOSIS — S79911A Unspecified injury of right hip, initial encounter: Secondary | ICD-10-CM | POA: Diagnosis not present

## 2016-06-25 DIAGNOSIS — M25561 Pain in right knee: Principal | ICD-10-CM

## 2016-06-25 DIAGNOSIS — Y939 Activity, unspecified: Secondary | ICD-10-CM | POA: Insufficient documentation

## 2016-06-25 DIAGNOSIS — R9431 Abnormal electrocardiogram [ECG] [EKG]: Secondary | ICD-10-CM | POA: Diagnosis not present

## 2016-06-25 DIAGNOSIS — I2584 Coronary atherosclerosis due to calcified coronary lesion: Secondary | ICD-10-CM | POA: Diagnosis not present

## 2016-06-25 DIAGNOSIS — Y92000 Kitchen of unspecified non-institutional (private) residence as  the place of occurrence of the external cause: Secondary | ICD-10-CM | POA: Diagnosis not present

## 2016-06-25 DIAGNOSIS — Z79899 Other long term (current) drug therapy: Secondary | ICD-10-CM | POA: Insufficient documentation

## 2016-06-25 DIAGNOSIS — R2689 Other abnormalities of gait and mobility: Secondary | ICD-10-CM | POA: Insufficient documentation

## 2016-06-25 DIAGNOSIS — S8991XA Unspecified injury of right lower leg, initial encounter: Secondary | ICD-10-CM | POA: Diagnosis not present

## 2016-06-25 DIAGNOSIS — Z87440 Personal history of urinary (tract) infections: Secondary | ICD-10-CM | POA: Diagnosis not present

## 2016-06-25 DIAGNOSIS — I6789 Other cerebrovascular disease: Secondary | ICD-10-CM | POA: Diagnosis not present

## 2016-06-25 DIAGNOSIS — I255 Ischemic cardiomyopathy: Secondary | ICD-10-CM | POA: Diagnosis not present

## 2016-06-25 LAB — BASIC METABOLIC PANEL
Anion gap: 8 (ref 5–15)
BUN: 33 mg/dL — AB (ref 6–20)
CHLORIDE: 99 mmol/L — AB (ref 101–111)
CO2: 35 mmol/L — ABNORMAL HIGH (ref 22–32)
CREATININE: 1.46 mg/dL — AB (ref 0.44–1.00)
Calcium: 8.8 mg/dL — ABNORMAL LOW (ref 8.9–10.3)
GFR, EST AFRICAN AMERICAN: 36 mL/min — AB (ref >60)
GFR, EST NON AFRICAN AMERICAN: 31 mL/min — AB (ref >60)
Glucose, Bld: 102 mg/dL — ABNORMAL HIGH (ref 65–99)
Potassium: 3.4 mmol/L — ABNORMAL LOW (ref 3.5–5.1)
SODIUM: 142 mmol/L (ref 135–145)

## 2016-06-25 LAB — CBC WITH DIFFERENTIAL/PLATELET
BASOS ABS: 0 10*3/uL (ref 0.0–0.1)
BASOS PCT: 0 %
EOS ABS: 0.1 10*3/uL (ref 0.0–0.7)
EOS PCT: 2 %
HCT: 34.4 % — ABNORMAL LOW (ref 36.0–46.0)
Hemoglobin: 11 g/dL — ABNORMAL LOW (ref 12.0–15.0)
Lymphocytes Relative: 24 %
Lymphs Abs: 1.9 10*3/uL (ref 0.7–4.0)
MCH: 30.6 pg (ref 26.0–34.0)
MCHC: 32 g/dL (ref 30.0–36.0)
MCV: 95.8 fL (ref 78.0–100.0)
MONO ABS: 0.7 10*3/uL (ref 0.1–1.0)
MONOS PCT: 8 %
Neutro Abs: 5.4 10*3/uL (ref 1.7–7.7)
Neutrophils Relative %: 66 %
PLATELETS: 169 10*3/uL (ref 150–400)
RBC: 3.59 MIL/uL — ABNORMAL LOW (ref 3.87–5.11)
RDW: 13.3 % (ref 11.5–15.5)
WBC: 8 10*3/uL (ref 4.0–10.5)

## 2016-06-25 LAB — URINALYSIS, ROUTINE W REFLEX MICROSCOPIC
BILIRUBIN URINE: NEGATIVE
Glucose, UA: NEGATIVE mg/dL
HGB URINE DIPSTICK: NEGATIVE
Ketones, ur: NEGATIVE mg/dL
Nitrite: POSITIVE — AB
Protein, ur: NEGATIVE mg/dL
Specific Gravity, Urine: 1.01 (ref 1.005–1.030)
pH: 5 (ref 5.0–8.0)

## 2016-06-25 LAB — PROTIME-INR
INR: 2.38
Prothrombin Time: 26.4 seconds — ABNORMAL HIGH (ref 11.4–15.2)

## 2016-06-25 LAB — LACTIC ACID, PLASMA: LACTIC ACID, VENOUS: 0.9 mmol/L (ref 0.5–1.9)

## 2016-06-25 LAB — BRAIN NATRIURETIC PEPTIDE: B NATRIURETIC PEPTIDE 5: 326 pg/mL — AB (ref 0.0–100.0)

## 2016-06-25 LAB — TROPONIN I

## 2016-06-25 MED ORDER — DEXTROSE 5 % IV SOLN
1.0000 g | Freq: Once | INTRAVENOUS | Status: AC
  Administered 2016-06-25: 1 g via INTRAVENOUS
  Filled 2016-06-25: qty 10

## 2016-06-25 NOTE — ED Notes (Signed)
Reported that pt with arthritis to knee and back. Pt from home, per pt daughter lives with her and has a caregiver as well.

## 2016-06-25 NOTE — ED Notes (Signed)
Per family pt has been felling asleep more frequently, reported that pt had fell asleep while talking on the phone.  Reported that pt was not able to get up from a sitting position at home with caregivers to assist.

## 2016-06-25 NOTE — ED Provider Notes (Signed)
[facility] DEPT Provider Note   CSN: [id number] Arrival date & time: 06/25/16  1614     History   Chief Complaint Chief Complaint  Patient presents with  . Leg Pain    HPI [name] is a 83 y.o. female.  HPI  Pt was seen at 98. Per pt, c/o sudden onset and resolution of one episode of fall to the floor that occurred 3 days ago. Pt states she is "often off balance" and was standing in her kitchen when she "just slid down to the floor." Pt states she fell onto her buttock and right hip. Pt was ambulatory after and since the fall. Pt c/o right hip and knee pain. Denies hitting head, no LOC, no AMS, no neck or back pain, no focal motor weakness, no tingling/numbness in extremities.   Past Medical History:  Diagnosis Date  . Arthritis    severe lower back pain  . CAD (coronary artery disease)    anterior MI in 2006 with Cypher DES to LAD  . Cardiomyopathy   . CHF (congestive heart failure) ([place])   . CKD (chronic kidney disease) stage 3, GFR 30-59 ml/min    Stage III-stage IV  . HTN (hypertension)   . LV (left ventricular) mural thrombus    on coumadin   . MI, old   . Nephrolithiasis   . OA (osteoarthritis) of hip   . On home oxygen therapy    at night  . Osteoporosis   . Other specified forms of chronic ischemic heart disease    echo (1/10) with EF 25-30%, mildly dilated LV, periapical LV aneueysm, calcified LV thombus, moderate MR. Medtronic ICD set to VVI.   Marland Kitchen Pure hypercholesterolemia     Patient Active Problem List   Diagnosis Date Noted  . Weakness generalized 02/06/2016  . CAP (community acquired pneumonia) 02/06/2016  . CKD (chronic kidney disease) stage 3, GFR 30-59 ml/min 04/26/2015  . Urinary tract infection 04/25/2015  . Cardiomyopathy, ischemic 04/25/2015  . Fecal impaction ([place]) 04/25/2015  . Degenerative joint disease 04/25/2015  . Chest pain, precordial 08/28/2014  . Encounter for therapeutic drug monitoring 05/02/2013  . CKD (chronic kidney  disease) 10/22/2011  . Angina at rest [facility]) 10/08/2011  . Lower urinary tract infectious disease 10/08/2011  . Ventricular tachycardia ([place]) 06/10/2011  . Systolic CHF, chronic ([place]) 12/11/2010  . LV (left ventricular) mural thrombus 12/11/2010  . Long term current use of anticoagulant therapy 06/25/2010  . ICD-Medtronic 05/01/2009  . HYPERCHOLESTEROLEMIA 06/19/2008  . Hyperlipidemia 06/19/2008  . HTN (hypertension) 06/19/2008  . Coronary artery disease due to calcified coronary lesion 06/19/2008  . Aneurysm of heart wall 06/19/2008  . UNSPECIFIED SYSTOLIC HEART FAILURE 26/94/8546  . ARTHRITIS 06/19/2008  . SHORTNESS OF BREATH 06/19/2008    Past Surgical History:  Procedure Laterality Date  . AICD implantation     ICD-Medtronic. Remote-yes   . CARDIAC CATHETERIZATION    . CORONARY ANGIOPLASTY    . CORONARY ANGIOPLASTY WITH STENT PLACEMENT    . VESICOVAGINAL FISTULA CLOSURE W/ TAH  1978    OB History    No data available       Home Medications    Prior to Admission medications   Medication Sig Start Date End Date Taking? Authorizing Provider  acetaminophen (TYLENOL) 325 MG tablet Take 325-650 mg by mouth every 6 (six) hours as needed for mild pain.    Yes Historical Provider, MD  atorvastatin (LIPITOR) 40 MG tablet Take 1 Tablet by mouth every  evening 06/06/16  Yes Jolaine Artist, MD  carvedilol (COREG) 25 MG tablet Take 1 Tablet by mouth 2 times a day 04/28/16  Yes Larey Dresser, MD  cholecalciferol (VITAMIN D) 1000 UNITS tablet Take 1,000 Units by mouth daily.     Yes Historical Provider, MD  folic acid (FOLVITE) 1 MG tablet Take 1 mg by mouth daily.   Yes Historical Provider, MD  furosemide (LASIX) 40 MG tablet Take 1 tablet (40 mg total) by mouth 2 (two) times daily. 05/20/16  Yes Larey Dresser, MD  gabapentin (NEURONTIN) 100 MG capsule 1 capsule 3 (three) times daily. 01/14/16  Yes Historical Provider, MD  isosorbide mononitrate (IMDUR) 60 MG 24 hr tablet Take 1  tablet (60 mg total) by mouth daily. 05/15/16  Yes Amy D Ninfa Meeker, NP  losartan (COZAAR) 50 MG tablet Take 1 Tablet by mouth once daily 01/07/16  Yes Satira Mccallum Tillery, PA-C  Multiple Vitamins-Minerals (ICAPS AREDS FORMULA PO) Take 2 capsules by mouth daily.    Yes Historical Provider, MD  nitroGLYCERIN (NITROSTAT) 0.4 MG SL tablet Place 1 tablet (0.4 mg total) under the tongue every 5 (five) minutes as needed for chest pain. 02/12/15  Yes Evans Lance, MD  pantoprazole (PROTONIX) 40 MG tablet Take 1 Tablet by mouth once daily 01/15/16  Yes Larey Dresser, MD  Phenazopyridine HCl (AZO-STANDARD PO) Take 2 tablets by mouth daily as needed.   Yes Historical Provider, MD  polyethylene glycol (MIRALAX / GLYCOLAX) packet Take 17 g by mouth daily. 04/28/15  Yes Domenic Polite, MD  saxagliptin HCl (ONGLYZA) 2.5 MG TABS tablet Take 2.5 mg by mouth daily.   Yes Historical Provider, MD  sertraline (ZOLOFT) 50 MG tablet Take 50 mg by mouth daily.    Yes Historical Provider, MD  spironolactone (ALDACTONE) 25 MG tablet Take 1 Tablet by mouth once daily 04/28/16  Yes Larey Dresser, MD  traMADol (ULTRAM) 50 MG tablet Take 1 tablet (50 mg total) by mouth every 6 (six) hours as needed for moderate pain. 09/12/15  Yes Julianne Rice, MD  warfarin (COUMADIN) 2.5 MG tablet TAKE AS DIRECTED BY ANTICOAGULATION CLINIC 06/08/15  Yes Evans Lance, MD    Family History Family History  Problem Relation Age of Onset  . Stroke Father   . Breast cancer Mother   . Stroke Brother   . Diabetes Brother     Social History Social History  Substance Use Topics  . Smoking status: Never Smoker  . Smokeless tobacco: Never Used  . Alcohol use No     Allergies   Codeine   Review of Systems Review of Systems ROS: Statement: All systems negative except as marked or noted in the HPI; Constitutional: Negative for fever and chills. ; ; Eyes: Negative for eye pain, redness and discharge. ; ; ENMT: Negative for ear pain,  hoarseness, nasal congestion, sinus pressure and sore throat. ; ; Cardiovascular: Negative for chest pain, palpitations, diaphoresis, dyspnea and peripheral edema. ; ; Respiratory: Negative for cough, wheezing and stridor. ; ; Gastrointestinal: Negative for nausea, vomiting, diarrhea, abdominal pain, blood in stool, hematemesis, jaundice and rectal bleeding. . ; ; Genitourinary: Negative for dysuria, flank pain and hematuria. ; ; Musculoskeletal: +right hip and knee pain. Negative for back pain and neck pain. Negative for swelling and deformity.; ; Skin: Negative for pruritus, rash, abrasions, blisters, bruising and skin lesion.; ; Neuro: Negative for headache, lightheadedness and neck stiffness. Negative for weakness, altered level of consciousness, altered mental  status, extremity weakness, paresthesias, involuntary movement, seizure and syncope.      Physical Exam Updated Vital Signs BP (!) 148/70   Pulse 72   Temp 99.1 F (37.3 C) (Oral)   Resp 18   Ht 4\' 11"  (1.499 m)   Wt 144 lb (65.3 kg)   SpO2 96%   BMI 29.08 kg/m     18:42:39 Orthostatic Vital Signs BD  Orthostatic Lying   BP- Lying: 163/64  Pulse- Lying: 70      Orthostatic Sitting  BP- Sitting: 176/67  Pulse- Sitting: 77      Orthostatic Standing at 0 minutes  BP- Standing at 0 minutes: 170/86  Pulse- Standing at 0 minutes: 85     Physical Exam 1630: Physical examination:  Nursing notes reviewed; Vital signs and O2 SAT reviewed;  Constitutional: Well developed, Well nourished, Well hydrated, In no acute distress; Head:  Normocephalic, atraumatic; Eyes: EOMI, PERRL, No scleral icterus; ENMT: Mouth and pharynx normal, Mucous membranes moist; Neck: Supple, Full range of motion, No lymphadenopathy; Cardiovascular: Regular rate and rhythm, No gallop; Respiratory: Breath sounds clear & equal bilaterally, No wheezes.  Speaking full sentences with ease, Normal respiratory effort/excursion; Chest: Nontender, Movement normal;  Abdomen: Soft, Nontender, Nondistended, Normal bowel sounds; Genitourinary: No CVA tenderness; Spine:  No midline CS, TS, LS tenderness.;; Extremities: Pulses normal, Pelvis stable. +mild TTP right hip. +decreased F/E right knee due to pain. Pt is able to lift extended RLE off stretcher, and extend right lower leg against resistance.  No ligamentous laxity.  No patellar or quad tendon step-offs.  NMS intact right foot, strong pedal pp. +plantarflexion of right foot w/calf squeeze.  No palpable gap right Achilles's tendon.  No proximal fibular head tenderness.  No erythema, warmth, ecchymosis or deformity.  +TTP right popliteal area and generalized TTP right knee without specific area of point tenderness. Mild localized right knee edema. NT right ankle/foot.  No calf edema or asymmetry.; Neuro: AA&Ox3, Major CN grossly intact.  Speech clear. No gross focal motor or sensory deficits in extremities.; Skin: Color normal, Warm, Dry.   ED Treatments / Results  Labs (all labs ordered are listed, but only abnormal results are displayed)   EKG  EKG Interpretation  Date/Time:  Wednesday June 25 2016 18:52:51 EDT Ventricular Rate:  76 PR Interval:    QRS Duration: 121 QT Interval:  380 QTC Calculation: 428 R Axis:   -62 Text Interpretation:  Sinus rhythm Probable left atrial enlargement Nonspecific IVCD with LAD Left ventricular hypertrophy Anterior infarct, old Abnormal T, consider ischemia, lateral leads Baseline wander When compared with ECG of 02/06/2016 No significant change was found Confirmed by [facility]  MD, Nunzio Cory [phone]) on 06/25/2016 6:57:00 PM       Radiology   Procedures Procedures (including critical care time)  Medications Ordered in ED Medications - No data to display   Initial Impression / Assessment and Plan / ED Course  I have reviewed the triage vital signs and the nursing notes.  Pertinent labs & imaging results that were available during my care of the patient were  reviewed by me and considered in my medical decision making (see chart for details).  MDM Reviewed: previous chart, nursing note and vitals Reviewed previous: labs Interpretation: x-ray and ultrasound   US Venous Img Lower Unilateral Right Result Date: 06/25/2016 CLINICAL DATA:  Pain and edema following fall EXAM: RIGHT LOWER EXTREMITY VENOUS DUPLEX ULTRASOUND TECHNIQUE: Gray-scale sonography with graded compression, as well as color Doppler and duplex ultrasound  were performed to evaluate the right lower extremity deep venous system from the level of the common femoral vein and including the common femoral, femoral, profunda femoral, popliteal and calf veins including the posterior tibial, peroneal and gastrocnemius veins when visible. The superficial great saphenous vein was also interrogated. Spectral Doppler was utilized to evaluate flow at rest and with distal augmentation maneuvers in the common femoral, femoral and popliteal veins. COMPARISON:  None. FINDINGS: Contralateral Common Femoral Vein: Respiratory phasicity is normal and symmetric with the symptomatic side. No evidence of thrombus. Normal compressibility. Common Femoral Vein: No evidence of thrombus. Normal compressibility, respiratory phasicity and response to augmentation. Saphenofemoral Junction: No evidence of thrombus. Normal compressibility and flow on color Doppler imaging. Profunda Femoral Vein: No evidence of thrombus. Normal compressibility and flow on color Doppler imaging. Femoral Vein: No evidence of thrombus. Normal compressibility, respiratory phasicity and response to augmentation. Popliteal Vein: No evidence of thrombus. Normal compressibility, respiratory phasicity and response to augmentation. Calf Veins: No evidence of thrombus. Normal compressibility and flow on color Doppler imaging. Superficial Great Saphenous Vein: No evidence of thrombus. Normal compressibility and flow on color Doppler imaging. Venous Reflux:  None.  Other Findings:  None. IMPRESSION: No evidence of right lower extremity deep venous thrombosis. Left common femoral vein also patent. Electronically Signed   By: Lowella Grip III M.D.   On: 06/25/2016 17:17   Dg Knee Complete 4 Views Right Result Date: 06/25/2016 CLINICAL DATA:  [place] at home 3 days ago.  RIGHT knee and hip pain. EXAM: RIGHT KNEE - COMPLETE 4+ VIEW COMPARISON:  None. FINDINGS: No acute fracture deformity or dislocation. Mild lateral compartment narrowing with marginal spurring compatible with osteoarthrosis. Minimal intra-articular calcification most compatible with CPPD. Osteopenia without destructive bony lesions. Moderate vascular calcifications. Small probable suprapatellar joint effusion. IMPRESSION: No acute fracture deformity or dislocation. Electronically Signed   By: Elon Alas M.D.   On: 06/25/2016 17:45   Dg Hip Unilat With Pelvis 2-3 Views Right Result Date: 06/25/2016 CLINICAL DATA:  [place] at home 3 days ago. EXAM: DG HIP (WITH OR WITHOUT PELVIS) 2-3V RIGHT COMPARISON:  RIGHT hip radiograph April 25, 2015 FINDINGS: There is no evidence of hip fracture or dislocation. There is no evidence of advanced arthropathy or other focal bone abnormality. Osteopenia. Moderate to severe vascular calcifications. Phleboliths project in the pelvis. IMPRESSION: Stable examination:  No acute fracture deformity or dislocation. If there is high clinical suspicion for occult hip fracture or if the patient refuses to bear-weight, consider further evaluation with MRI. Although CT is expeditious, evidence is lacking regarding accuracy of CT over plain film radiography. Electronically Signed   By: Elon Alas M.D.   On: 06/25/2016 17:46   Dg Chest 2 View Result Date: 06/25/2016 CLINICAL DATA:  Dizziness and fall 3 days ago.  History of vertigo. EXAM: CHEST  2 VIEW COMPARISON:  Chest radiograph February 06, 2016 FINDINGS: Cardiac silhouette is mildly enlarged unchanged. Mildly calcified,  tortuous aorta. Persistently elevated RIGHT hemidiaphragm with improved aeration RIGHT lung base. Bibasilar linear densities without pleural effusion. Mild chronic bronchitic changes. No pneumothorax. Dual lead LEFT AICD. Bilateral calcified breast implants. Osteopenia. IMPRESSION: Mild cardiomegaly and bibasilar atelectasis/ scarring. Electronically Signed   By: Elon Alas M.D.   On: 06/25/2016 18:51    Results for orders placed or performed during the hospital encounter of 06/25/16  Urinalysis, Routine w reflex microscopic  Result Value Ref Range   Color, Urine YELLOW YELLOW   APPearance CLOUDY (A) CLEAR  Specific Gravity, Urine 1.010 1.005 - 1.030   pH 5.0 5.0 - 8.0   Glucose, UA NEGATIVE NEGATIVE mg/dL   Hgb urine dipstick NEGATIVE NEGATIVE   Bilirubin Urine NEGATIVE NEGATIVE   Ketones, ur NEGATIVE NEGATIVE mg/dL   Protein, ur NEGATIVE NEGATIVE mg/dL   Nitrite POSITIVE (A) NEGATIVE   Leukocytes, UA MODERATE (A) NEGATIVE   RBC / HPF 0-5 0 - 5 RBC/hpf   WBC, UA TOO NUMEROUS TO COUNT 0 - 5 WBC/hpf   Bacteria, UA MANY (A) NONE SEEN   Squamous Epithelial / LPF 0-5 (A) NONE SEEN   WBC Clumps PRESENT    Hyaline Casts, UA PRESENT   Basic metabolic panel  Result Value Ref Range   Sodium 142 135 - 145 mmol/L   Potassium 3.4 (L) 3.5 - 5.1 mmol/L   Chloride 99 (L) 101 - 111 mmol/L   CO2 35 (H) 22 - 32 mmol/L   Glucose, Bld 102 (H) 65 - 99 mg/dL   BUN 33 (H) 6 - 20 mg/dL   Creatinine, Ser 1.46 (H) 0.44 - 1.00 mg/dL   Calcium 8.8 (L) 8.9 - 10.3 mg/dL   GFR calc non Af Amer 31 (L) >60 mL/min   GFR calc Af Amer 36 (L) >60 mL/min   Anion gap 8 5 - 15  Brain natriuretic peptide  Result Value Ref Range   B Natriuretic Peptide 326.0 (H) 0.0 - 100.0 pg/mL  Troponin I  Result Value Ref Range   Troponin I <0.03 <0.03 ng/mL  CBC with Differential  Result Value Ref Range   WBC 8.0 4.0 - 10.5 K/uL   RBC 3.59 (L) 3.87 - 5.11 MIL/uL   Hemoglobin 11.0 (L) 12.0 - 15.0 g/dL   HCT 34.4 (L)  36.0 - 46.0 %   MCV 95.8 78.0 - 100.0 fL   MCH 30.6 26.0 - 34.0 pg   MCHC 32.0 30.0 - 36.0 g/dL   RDW 13.3 11.5 - 15.5 %   Platelets 169 150 - 400 K/uL   Neutrophils Relative % 66 %   Neutro Abs 5.4 1.7 - 7.7 K/uL   Lymphocytes Relative 24 %   Lymphs Abs 1.9 0.7 - 4.0 K/uL   Monocytes Relative 8 %   Monocytes Absolute 0.7 0.1 - 1.0 K/uL   Eosinophils Relative 2 %   Eosinophils Absolute 0.1 0.0 - 0.7 K/uL   Basophils Relative 0 %   Basophils Absolute 0.0 0.0 - 0.1 K/uL  Protime-INR  Result Value Ref Range   Prothrombin Time 26.4 (H) 11.4 - 15.2 seconds   INR 2.38   Lactic acid, plasma  Result Value Ref Range   Lactic Acid, Venous 0.9 0.5 - 1.9 mmol/L   Results for DENIQUA, PERRY (MRN [id number]) as of 06/25/2016 20:34  Ref. Range 02/06/2016 12:47 02/07/2016 07:03 05/15/2016 15:24 06/25/2016 19:00  BUN Latest Ref Range: 6 - 20 mg/dL 30 (H) 28 (H) 45 (H) 33 (H)  Creatinine Latest Ref Range: 0.44 - 1.00 mg/dL 1.08 (H) 1.19 (H) 1.59 (H) 1.46 (H)   1715:  INR 2.7 yesterday.   1800:  Family is now at bedside: daughter now states they would like a workup for "her being tired today," "falling asleep frequently," "falling asleep while talking on the phone today," and "being unable to get up from sitting position at home without caregiver's assist." They state her caregiver "thinks she has a UTI" and "my son the EMT thinks an ABG needs to be done because her oxygen level was  low at 2.5." Explained will not obtain ABG at this time, as pt's O2 Sats are 98% on her usual O2 2L N/C. Family verb understanding.  Will obtain labs, CXR, UA.    2140:  BNP mildly elevated, but improved from previous and no overt CHF on CXR; doubt CHF exacerbation at this time. H/H per baseline. BUN/Cr per baseline. +UTI, UC pending; will dose IV rocephin. Pt unable to ambulate despite maximal assist due to generalized weakness. Dx and testing d/w pt and family.  Questions answered.  Verb understanding, agreeable to admit. T/C  to [facility] Dr. Darrick Meigs, case discussed, including:  HPI, pertinent PM/SHx, VS/PE, dx testing, ED course and treatment:  Agreeable to admit.    Final Clinical Impressions(s) / ED Diagnoses   Final diagnoses:  None    New Prescriptions New Prescriptions   No medications on file     Francine Graven, DO 06/26/16 1500

## 2016-06-25 NOTE — H&P (Signed)
TRH H&P    Patient Demographics:    [name], is a 83 y.o. female  MRN: [id number]  DOB - 1925/12/17  Admit Date - 06/25/2016  Referring MD/NP/PA: Dr. Thurnell Garbe  Outpatient Primary MD for the patient is [name], MD  Patient coming from: Home  Chief Complaint  Patient presents with  . Leg Pain      HPI:    [name]  is a 83 y.o. female, with history of CAD,  CHF, hypertension, chronic anticoagulation for LV mural  thrombus who was brought to the hospital after patient was having difficulty walking at home. Patient says that she fell 3 days ago when she was standing in her kitchen and slid down to the floor. She fell onto her buttock and right hip. Also complained of pain in right hip and knee. Patient says that she was able to walk 3 days ago but today she was unable to bear weight on right side.  In the ED x-rays of hip and knee shows no acute abnormality. Ultrasound of the right lower extremity shows no DVT.  Patient denies syncope. Complains of dysuria. Found to have abnormal UA and started on ceftriaxone.    Review of systems:    In addition to the HPI above,  No Fever-chills, No Headache, No changes with Vision or hearing, No problems swallowing food or Liquids, No Chest pain, Cough or Shortness of Breath, No Abdominal pain, No Nausea or Vomiting, bowel movements are regular, No Blood in stool or Urine, No new skin rashes or bruises, No new joints pains-aches,  No new weakness, tingling, numbness in any extremity, No recent weight gain or loss, No polyuria, polydypsia or polyphagia, No significant Mental Stressors.  A full 10 point Review of Systems was done, except as stated above, all other Review of Systems were negative.   With Past History of the following :    Past Medical History:  Diagnosis Date  . Arthritis    severe lower back pain  . CAD (coronary artery disease)      anterior MI in 2006 with Cypher DES to LAD  . Cardiomyopathy   . CHF (congestive heart failure) ([place])   . CKD (chronic kidney disease) stage 3, GFR 30-59 ml/min    Stage III-stage IV  . HTN (hypertension)   . LV (left ventricular) mural thrombus    on coumadin   . MI, old   . Nephrolithiasis   . OA (osteoarthritis) of hip   . On home oxygen therapy    at night  . Osteoporosis   . Other specified forms of chronic ischemic heart disease    echo (1/10) with EF 25-30%, mildly dilated LV, periapical LV aneueysm, calcified LV thombus, moderate MR. Medtronic ICD set to VVI.   Marland Kitchen Pure hypercholesterolemia       Past Surgical History:  Procedure Laterality Date  . AICD implantation     ICD-Medtronic. Remote-yes   . CARDIAC CATHETERIZATION    . CORONARY ANGIOPLASTY    . CORONARY ANGIOPLASTY WITH STENT PLACEMENT    .  VESICOVAGINAL FISTULA CLOSURE W/ TAH  1978      Social History:      Social History  Substance Use Topics  . Smoking status: Never Smoker  . Smokeless tobacco: Never Used  . Alcohol use No       Family History :     Family History  Problem Relation Age of Onset  . Stroke Father   . Breast cancer Mother   . Stroke Brother   . Diabetes Brother       Home Medications:   Prior to Admission medications   Medication Sig Start Date End Date Taking? Authorizing Provider  acetaminophen (TYLENOL) 325 MG tablet Take 325-650 mg by mouth every 6 (six) hours as needed for mild pain.    Yes Historical Provider, MD  atorvastatin (LIPITOR) 40 MG tablet Take 1 Tablet by mouth every evening 06/06/16  Yes Jolaine Artist, MD  carvedilol (COREG) 25 MG tablet Take 1 Tablet by mouth 2 times a day 04/28/16  Yes Larey Dresser, MD  cholecalciferol (VITAMIN D) 1000 UNITS tablet Take 1,000 Units by mouth daily.     Yes Historical Provider, MD  folic acid (FOLVITE) 1 MG tablet Take 1 mg by mouth daily.   Yes Historical Provider, MD  furosemide (LASIX) 40 MG tablet Take 1  tablet (40 mg total) by mouth 2 (two) times daily. 05/20/16  Yes Larey Dresser, MD  gabapentin (NEURONTIN) 100 MG capsule 1 capsule 3 (three) times daily. 01/14/16  Yes Historical Provider, MD  isosorbide mononitrate (IMDUR) 60 MG 24 hr tablet Take 1 tablet (60 mg total) by mouth daily. 05/15/16  Yes Amy D Ninfa Meeker, NP  losartan (COZAAR) 50 MG tablet Take 1 Tablet by mouth once daily 01/07/16  Yes Satira Mccallum Tillery, PA-C  Multiple Vitamins-Minerals (ICAPS AREDS FORMULA PO) Take 2 capsules by mouth daily.    Yes Historical Provider, MD  nitroGLYCERIN (NITROSTAT) 0.4 MG SL tablet Place 1 tablet (0.4 mg total) under the tongue every 5 (five) minutes as needed for chest pain. 02/12/15  Yes Evans Lance, MD  pantoprazole (PROTONIX) 40 MG tablet Take 1 Tablet by mouth once daily 01/15/16  Yes Larey Dresser, MD  Phenazopyridine HCl (AZO-STANDARD PO) Take 2 tablets by mouth daily as needed.   Yes Historical Provider, MD  polyethylene glycol (MIRALAX / GLYCOLAX) packet Take 17 g by mouth daily. 04/28/15  Yes Domenic Polite, MD  saxagliptin HCl (ONGLYZA) 2.5 MG TABS tablet Take 2.5 mg by mouth daily.   Yes Historical Provider, MD  sertraline (ZOLOFT) 50 MG tablet Take 50 mg by mouth daily.    Yes Historical Provider, MD  spironolactone (ALDACTONE) 25 MG tablet Take 1 Tablet by mouth once daily 04/28/16  Yes Larey Dresser, MD  traMADol (ULTRAM) 50 MG tablet Take 1 tablet (50 mg total) by mouth every 6 (six) hours as needed for moderate pain. 09/12/15  Yes Julianne Rice, MD  warfarin (COUMADIN) 2.5 MG tablet TAKE AS DIRECTED BY ANTICOAGULATION CLINIC 06/08/15  Yes Evans Lance, MD     Allergies:     Allergies  Allergen Reactions  . Codeine Nausea And Vomiting     Physical Exam:   Vitals  Blood pressure (!) 158/68, pulse 71, temperature 99.1 F (37.3 C), temperature source Oral, resp. rate 19, height 4\' 11"  (1.499 m), weight 65.3 kg (144 lb), SpO2 94 %.  1.  General: Appears in no acute  distress  2. Psychiatric:  Intact judgement and  insight, awake alert, oriented x 3.  3. Neurologic: No focal neurological deficits, all cranial nerves intact.Strength 5/5 all 4 extremities, sensation intact all 4 extremities, plantars down going.  4. Eyes :  anicteric sclerae, moist conjunctivae with no lid lag. PERRLA.  5. ENMT:  Oropharynx clear with moist mucous membranes and good dentition  6. Neck:  supple, no cervical lymphadenopathy appriciated, No thyromegaly  7. Respiratory : Normal respiratory effort, good air movement bilaterally,clear to  auscultation bilaterally  8. Cardiovascular : RRR, no gallops, rubs or murmurs, no leg edema  9. Gastrointestinal:  Positive bowel sounds, abdomen soft, non-tender to palpation,no hepatosplenomegaly, no rigidity or guarding       10. Skin:  No cyanosis, normal texture and turgor, no rash, lesions or ulcers  11.Musculoskeletal:  Right knee- warm to touch, no erythema noted.    Data Review:    CBC  Recent Labs Lab 06/25/16 1900  WBC 8.0  HGB 11.0*  HCT 34.4*  PLT 169  MCV 95.8  MCH 30.6  MCHC 32.0  RDW 13.3  LYMPHSABS 1.9  MONOABS 0.7  EOSABS 0.1  BASOSABS 0.0   ------------------------------------------------------------------------------------------------------------------  Chemistries   Recent Labs Lab 06/25/16 1900  NA 142  K 3.4*  CL 99*  CO2 35*  GLUCOSE 102*  BUN 33*  CREATININE 1.46*  CALCIUM 8.8*   ------------------------------------------------------------------------------------------------------------------  --------------------------------------------------------Coagulation Profile:  Recent Labs Lab 06/24/16 06/25/16 1900  INR 2.7 2.38   Cardiac Enzymes:  Recent Labs Lab 06/25/16 1900  TROPONINI <0.03    --------------------------------------------------------------------------------------------------------------- Urine analysis:    Component Value Date/Time    COLORURINE YELLOW 06/25/2016 1807   APPEARANCEUR CLOUDY (A) 06/25/2016 1807   LABSPEC 1.010 06/25/2016 1807   PHURINE 5.0 06/25/2016 1807   GLUCOSEU NEGATIVE 06/25/2016 1807   HGBUR NEGATIVE 06/25/2016 1807   BILIRUBINUR NEGATIVE 06/25/2016 1807   KETONESUR NEGATIVE 06/25/2016 1807   PROTEINUR NEGATIVE 06/25/2016 1807   UROBILINOGEN 4.0 (H) 08/28/2014 1119   NITRITE POSITIVE (A) 06/25/2016 1807   LEUKOCYTESUR MODERATE (A) 06/25/2016 1807      Imaging Results:    Dg Chest 2 View  Result Date: 06/25/2016 CLINICAL DATA:  Dizziness and fall 3 days ago.  History of vertigo. EXAM: CHEST  2 VIEW COMPARISON:  Chest radiograph February 06, 2016 FINDINGS: Cardiac silhouette is mildly enlarged unchanged. Mildly calcified, tortuous aorta. Persistently elevated RIGHT hemidiaphragm with improved aeration RIGHT lung base. Bibasilar linear densities without pleural effusion. Mild chronic bronchitic changes. No pneumothorax. Dual lead LEFT AICD. Bilateral calcified breast implants. Osteopenia. IMPRESSION: Mild cardiomegaly and bibasilar atelectasis/ scarring. Electronically Signed   By: Elon Alas M.D.   On: 06/25/2016 18:51   US Venous Img Lower Unilateral Right  Result Date: 06/25/2016 CLINICAL DATA:  Pain and edema following fall EXAM: RIGHT LOWER EXTREMITY VENOUS DUPLEX ULTRASOUND TECHNIQUE: Gray-scale sonography with graded compression, as well as color Doppler and duplex ultrasound were performed to evaluate the right lower extremity deep venous system from the level of the common femoral vein and including the common femoral, femoral, profunda femoral, popliteal and calf veins including the posterior tibial, peroneal and gastrocnemius veins when visible. The superficial great saphenous vein was also interrogated. Spectral Doppler was utilized to evaluate flow at rest and with distal augmentation maneuvers in the common femoral, femoral and popliteal veins. COMPARISON:  None. FINDINGS: Contralateral  Common Femoral Vein: Respiratory phasicity is normal and symmetric with the symptomatic side. No evidence of thrombus. Normal compressibility. Common Femoral Vein: No evidence of thrombus. Normal  compressibility, respiratory phasicity and response to augmentation. Saphenofemoral Junction: No evidence of thrombus. Normal compressibility and flow on color Doppler imaging. Profunda Femoral Vein: No evidence of thrombus. Normal compressibility and flow on color Doppler imaging. Femoral Vein: No evidence of thrombus. Normal compressibility, respiratory phasicity and response to augmentation. Popliteal Vein: No evidence of thrombus. Normal compressibility, respiratory phasicity and response to augmentation. Calf Veins: No evidence of thrombus. Normal compressibility and flow on color Doppler imaging. Superficial Great Saphenous Vein: No evidence of thrombus. Normal compressibility and flow on color Doppler imaging. Venous Reflux:  None. Other Findings:  None. IMPRESSION: No evidence of right lower extremity deep venous thrombosis. Left common femoral vein also patent. Electronically Signed   By: Lowella Grip III M.D.   On: 06/25/2016 17:17   Dg Knee Complete 4 Views Right  Result Date: 06/25/2016 CLINICAL DATA:  [place] at home 3 days ago.  RIGHT knee and hip pain. EXAM: RIGHT KNEE - COMPLETE 4+ VIEW COMPARISON:  None. FINDINGS: No acute fracture deformity or dislocation. Mild lateral compartment narrowing with marginal spurring compatible with osteoarthrosis. Minimal intra-articular calcification most compatible with CPPD. Osteopenia without destructive bony lesions. Moderate vascular calcifications. Small probable suprapatellar joint effusion. IMPRESSION: No acute fracture deformity or dislocation. Electronically Signed   By: Elon Alas M.D.   On: 06/25/2016 17:45   Dg Hip Unilat With Pelvis 2-3 Views Right  Result Date: 06/25/2016 CLINICAL DATA:  [place] at home 3 days ago. EXAM: DG HIP (WITH OR WITHOUT  PELVIS) 2-3V RIGHT COMPARISON:  RIGHT hip radiograph April 25, 2015 FINDINGS: There is no evidence of hip fracture or dislocation. There is no evidence of advanced arthropathy or other focal bone abnormality. Osteopenia. Moderate to severe vascular calcifications. Phleboliths project in the pelvis. IMPRESSION: Stable examination:  No acute fracture deformity or dislocation. If there is high clinical suspicion for occult hip fracture or if the patient refuses to bear-weight, consider further evaluation with MRI. Although CT is expeditious, evidence is lacking regarding accuracy of CT over plain film radiography. Electronically Signed   By: Elon Alas M.D.   On: 06/25/2016 17:46    My personal review of EKG: Rhythm NSR   Assessment & Plan:    Active Problems:   HTN (hypertension)   Coronary artery disease due to calcified coronary lesion   Long term current use of anticoagulant therapy   Weakness generalized   UTI (urinary tract infection)   1. UTI- patient has abnormal UA with positive nitrite, also complains of dysuria ,place under observation, start ceftriaxone per pharmacy consultation. Follow urine culture results. 2. Status post fall- patient having difficulty walking status post fall complains of right knee and hip pain. X-rays of knee and hip shows no acute abnormality. Continue Tylenol when necessary for pain. Will obtain PT OT consultation. 3. History of CHF- no shortness of breath at this time, BNP stable at 326. Will hold Lasix due to mildly elevated creatinine. If labs stable in the morning consider restarting Lasix in a.m. 4. Diabetes mellitus- will start sliding scale insulin with NovoLog. 5. Hypokalemia-potassium is 3.4, will replace potassium and check BMP in a.m. 6. Chronic anticoagulation- patient is on anti-coagulation with Coumadin for LV mural thrombus. Will continue with Coumadin per pharmacy consultation.   DVT Prophylaxis-  Patient on warfarin  AM Labs  Ordered, also please review Full Orders  Family Communication: Admission, patients condition and plan of care including tests being ordered have been discussed with the patient  who indicate understanding and  agree with the plan and Code Status.  Code Status: DO NOT RESUSCITATE  Admission status: Observation    Time spent in minutes : 60 minutes   Divit Stipp S M.D on 06/25/2016 at 10:59 PM  Between 7am to 7pm - Pager - [phone]. After 7pm go to [email or web address] - password [facility]  [facility] - Office  [phone]

## 2016-06-25 NOTE — ED Notes (Signed)
Pt unable to ambulate with max assist

## 2016-06-25 NOTE — ED Triage Notes (Signed)
Pt fell on Sunday with dizziness before fall- reported pt lost balance.  Pt with hx of vertigo.  Suppose to see Dr. Maudie Mercury today but family decided to send pt here to ER instead for evaluation.

## 2016-06-25 NOTE — ED Notes (Signed)
ED Provider at bedside. 

## 2016-06-26 DIAGNOSIS — I10 Essential (primary) hypertension: Secondary | ICD-10-CM

## 2016-06-26 DIAGNOSIS — N390 Urinary tract infection, site not specified: Secondary | ICD-10-CM | POA: Diagnosis not present

## 2016-06-26 DIAGNOSIS — N183 Chronic kidney disease, stage 3 (moderate): Secondary | ICD-10-CM | POA: Diagnosis not present

## 2016-06-26 DIAGNOSIS — R531 Weakness: Secondary | ICD-10-CM | POA: Diagnosis not present

## 2016-06-26 DIAGNOSIS — I5042 Chronic combined systolic (congestive) and diastolic (congestive) heart failure: Secondary | ICD-10-CM | POA: Diagnosis not present

## 2016-06-26 DIAGNOSIS — J9611 Chronic respiratory failure with hypoxia: Secondary | ICD-10-CM

## 2016-06-26 LAB — CBC
HEMATOCRIT: 32.1 % — AB (ref 36.0–46.0)
HEMOGLOBIN: 10.3 g/dL — AB (ref 12.0–15.0)
MCH: 30.4 pg (ref 26.0–34.0)
MCHC: 32.1 g/dL (ref 30.0–36.0)
MCV: 94.7 fL (ref 78.0–100.0)
Platelets: 167 10*3/uL (ref 150–400)
RBC: 3.39 MIL/uL — AB (ref 3.87–5.11)
RDW: 13.1 % (ref 11.5–15.5)
WBC: 7.1 10*3/uL (ref 4.0–10.5)

## 2016-06-26 LAB — COMPREHENSIVE METABOLIC PANEL
ALBUMIN: 3.2 g/dL — AB (ref 3.5–5.0)
ALK PHOS: 77 U/L (ref 38–126)
ALT: 10 U/L — AB (ref 14–54)
ANION GAP: 9 (ref 5–15)
AST: 15 U/L (ref 15–41)
BILIRUBIN TOTAL: 0.7 mg/dL (ref 0.3–1.2)
BUN: 29 mg/dL — ABNORMAL HIGH (ref 6–20)
CO2: 33 mmol/L — ABNORMAL HIGH (ref 22–32)
Calcium: 8.6 mg/dL — ABNORMAL LOW (ref 8.9–10.3)
Chloride: 100 mmol/L — ABNORMAL LOW (ref 101–111)
Creatinine, Ser: 1.11 mg/dL — ABNORMAL HIGH (ref 0.44–1.00)
GFR calc Af Amer: 50 mL/min — ABNORMAL LOW (ref >60)
GFR calc non Af Amer: 43 mL/min — ABNORMAL LOW (ref >60)
GLUCOSE: 123 mg/dL — AB (ref 65–99)
Potassium: 3.2 mmol/L — ABNORMAL LOW (ref 3.5–5.1)
SODIUM: 142 mmol/L (ref 135–145)
TOTAL PROTEIN: 6.2 g/dL — AB (ref 6.5–8.1)

## 2016-06-26 LAB — GLUCOSE, CAPILLARY
GLUCOSE-CAPILLARY: 113 mg/dL — AB (ref 65–99)
GLUCOSE-CAPILLARY: 103 mg/dL — AB (ref 65–99)
GLUCOSE-CAPILLARY: 107 mg/dL — AB (ref 65–99)
GLUCOSE-CAPILLARY: 112 mg/dL — AB (ref 65–99)
Glucose-Capillary: 154 mg/dL — ABNORMAL HIGH (ref 65–99)

## 2016-06-26 MED ORDER — ISOSORBIDE MONONITRATE ER 60 MG PO TB24
60.0000 mg | ORAL_TABLET | Freq: Every day | ORAL | Status: DC
  Administered 2016-06-26 – 2016-06-27 (×2): 60 mg via ORAL
  Filled 2016-06-26 (×2): qty 1

## 2016-06-26 MED ORDER — CARVEDILOL 12.5 MG PO TABS
25.0000 mg | ORAL_TABLET | Freq: Two times a day (BID) | ORAL | Status: DC
  Administered 2016-06-26 – 2016-06-27 (×3): 25 mg via ORAL
  Filled 2016-06-26 (×3): qty 2

## 2016-06-26 MED ORDER — POTASSIUM CHLORIDE CRYS ER 20 MEQ PO TBCR
40.0000 meq | EXTENDED_RELEASE_TABLET | Freq: Once | ORAL | Status: AC
  Administered 2016-06-26: 40 meq via ORAL
  Filled 2016-06-26: qty 2

## 2016-06-26 MED ORDER — FOLIC ACID 1 MG PO TABS
1.0000 mg | ORAL_TABLET | Freq: Every day | ORAL | Status: DC
  Administered 2016-06-26 – 2016-06-27 (×2): 1 mg via ORAL
  Filled 2016-06-26 (×2): qty 1

## 2016-06-26 MED ORDER — LOSARTAN POTASSIUM 50 MG PO TABS
50.0000 mg | ORAL_TABLET | Freq: Every day | ORAL | Status: DC
  Administered 2016-06-26 – 2016-06-27 (×2): 50 mg via ORAL
  Filled 2016-06-26 (×2): qty 1

## 2016-06-26 MED ORDER — ORAL CARE MOUTH RINSE
15.0000 mL | Freq: Two times a day (BID) | OROMUCOSAL | Status: DC
  Administered 2016-06-26 – 2016-06-27 (×3): 15 mL via OROMUCOSAL

## 2016-06-26 MED ORDER — ONDANSETRON HCL 4 MG/2ML IJ SOLN
4.0000 mg | Freq: Four times a day (QID) | INTRAMUSCULAR | Status: DC | PRN
  Administered 2016-06-27: 4 mg via INTRAVENOUS
  Filled 2016-06-26: qty 2

## 2016-06-26 MED ORDER — ACETAMINOPHEN 325 MG PO TABS
325.0000 mg | ORAL_TABLET | Freq: Four times a day (QID) | ORAL | Status: DC | PRN
  Administered 2016-06-26: 650 mg via ORAL
  Filled 2016-06-26: qty 2

## 2016-06-26 MED ORDER — WARFARIN - PHARMACIST DOSING INPATIENT
Status: DC
  Administered 2016-06-26: 16:00:00

## 2016-06-26 MED ORDER — TRAMADOL HCL 50 MG PO TABS: 50.0000 mg | ORAL_TABLET | Freq: Four times a day (QID) | ORAL | Status: DC | PRN

## 2016-06-26 MED ORDER — PANTOPRAZOLE SODIUM 40 MG PO TBEC
40.0000 mg | DELAYED_RELEASE_TABLET | Freq: Every day | ORAL | Status: DC
  Administered 2016-06-26 – 2016-06-27 (×2): 40 mg via ORAL
  Filled 2016-06-26 (×2): qty 1

## 2016-06-26 MED ORDER — ATORVASTATIN CALCIUM 40 MG PO TABS
40.0000 mg | ORAL_TABLET | Freq: Every evening | ORAL | Status: DC
  Administered 2016-06-26: 40 mg via ORAL
  Filled 2016-06-26: qty 1

## 2016-06-26 MED ORDER — WARFARIN SODIUM 2.5 MG PO TABS
2.5000 mg | ORAL_TABLET | Freq: Once | ORAL | Status: AC
  Administered 2016-06-26: 2.5 mg via ORAL
  Filled 2016-06-26: qty 1

## 2016-06-26 MED ORDER — POLYETHYLENE GLYCOL 3350 17 G PO PACK
17.0000 g | PACK | Freq: Every day | ORAL | Status: DC
  Administered 2016-06-26 – 2016-06-27 (×2): 17 g via ORAL
  Filled 2016-06-26 (×2): qty 1

## 2016-06-26 MED ORDER — DEXTROSE 5 % IV SOLN
1.0000 g | INTRAVENOUS | Status: DC
  Administered 2016-06-26 – 2016-06-27 (×2): 1 g via INTRAVENOUS
  Filled 2016-06-26 (×4): qty 10

## 2016-06-26 MED ORDER — SPIRONOLACTONE 25 MG PO TABS
25.0000 mg | ORAL_TABLET | Freq: Every day | ORAL | Status: DC
  Administered 2016-06-26 – 2016-06-27 (×2): 25 mg via ORAL
  Filled 2016-06-26 (×2): qty 1

## 2016-06-26 MED ORDER — SERTRALINE HCL 50 MG PO TABS
50.0000 mg | ORAL_TABLET | Freq: Every day | ORAL | Status: DC
  Administered 2016-06-26 – 2016-06-27 (×2): 50 mg via ORAL
  Filled 2016-06-26 (×2): qty 1

## 2016-06-26 MED ORDER — INSULIN ASPART 100 UNIT/ML ~~LOC~~ SOLN
0.0000 [IU] | Freq: Three times a day (TID) | SUBCUTANEOUS | Status: DC
  Administered 2016-06-27: 2 [IU] via SUBCUTANEOUS

## 2016-06-26 MED ORDER — GABAPENTIN 100 MG PO CAPS
100.0000 mg | ORAL_CAPSULE | Freq: Three times a day (TID) | ORAL | Status: DC
  Administered 2016-06-26 – 2016-06-27 (×5): 100 mg via ORAL
  Filled 2016-06-26 (×5): qty 1

## 2016-06-26 NOTE — Progress Notes (Signed)
Pharmacy Antibiotic Note  [name] is a 83 y.o. female admitted on 06/25/2016 with UTI.  Pharmacy has been consulted for ROCEPHIN dosing.  Plan: Rocephin 1gm IV q24hrs Monitor labs, progress, c/s  Height: 4\' 11"  (149.9 cm) Weight: 144 lb 13.5 oz (65.7 kg) IBW/kg (Calculated) : 43.2  Temp (24hrs), Avg:98.9 F (37.2 C), Min:98.5 F (36.9 C), Max:99.1 F (37.3 C)   Recent Labs Lab 06/25/16 1900 06/25/16 1945 06/26/16 0418  WBC 8.0  --  7.1  CREATININE 1.46*  --  1.11*  LATICACIDVEN  --  0.9  --     Estimated Creatinine Clearance: 28.3 mL/min (A) (by C-G formula based on SCr of 1.11 mg/dL (H)).    Allergies  Allergen Reactions  . Codeine Nausea And Vomiting   Antimicrobials this admission: Rocephin 4/4 >>   Dose adjustments this admission:  Microbiology results:  UCx: pending   Thank you for allowing pharmacy to be a part of this patient's care.  Hart Robinsons A 06/26/2016 8:30 AM

## 2016-06-26 NOTE — Care Management Obs Status (Signed)
[facility] NOTIFICATION   Patient Details  Name: [name] MRN: [id number] Date of Birth: 1926/02/12   Medicare Observation Status Notification Given:  Yes    Aleane Wesenberg, Chauncey Reading, RN 06/26/2016, 11:32 AM

## 2016-06-26 NOTE — Progress Notes (Signed)
PROGRESS NOTE    [name]  FKC:[id number] DOB: 02-11-1926 [name]: 06/25/2016 PCP: [name], MD    Brief Narrative:  83 year old female with history of CHF, chronic respiratory failure and chronic kidney disease stage III, presents to the hospital with difficulty walking and repeated falls. She complained of pain in her right hip and knee. She did complain of dysuria and was found to have a urinary tract infection. She was started on Rocephin. She has been admitted for further treatments.   Assessment & Plan:   Active Problems:   HTN (hypertension)   Coronary artery disease due to calcified coronary lesion   Long term current use of anticoagulant therapy   Weakness generalized   UTI (urinary tract infection)   1. Urinary tract infection. Abnormal UA with positive nitrates. Urine culture is in process. Continue on Rocephin.  2. Generalized weakness. Patient has been having frequent falls. Seen by physical therapy recommended skilled nursing facility placement.  3. History of LV mural thrombus. On Coumadin. Continue anticoagulation per pharmacy.  4. Hypokalemia. Replace. Continue spironolactone  5. Diabetes. Saxagliptin currently on hold. Continue on sliding scale insulin  6. Hypertension. Continue on losartan. Blood pressure currently stable.  7. Chronic combined CHF. Ejection fraction of 25-30% on last echocardiogram from 2016. Appears compensated at this time. Continue on spironolactone, losartan, Imdur and beta blockers. Lasix currently on hold since patient appeared to be mildly dehydrated.  8. Chronic respiratory failure with hypoxia. Chronically on 2 L of oxygen.  9. Chronic kidney disease stage III. Creatinine appears to be near baseline. Continue to monitor.   DVT prophylaxis: Coumadin Code Status: DO NOT RESUSCITATE Family Communication: Discussed with family at the bedside Disposition Plan: Will need skilled nursing facility placement   Consultants:      Procedures:     Antimicrobials:   Rocephin 4/4>>   Subjective: Does not feel well. Still has pain in knees. Feels overall weak.  Objective: Vitals:   06/26/16 0445 06/26/16 0926 06/26/16 0930 06/26/16 1332  BP: (!) 138/53  (!) 161/59 (!) 123/46  Pulse: 63  90 61  Resp: 18  18 20   Temp: 98.5 F (36.9 C)   98.2 F (36.8 C)  TempSrc: Oral   Oral  SpO2: 95% 95%  95%  Weight:      Height:        Intake/Output Summary (Last 24 hours) at 06/26/16 1518 Last data filed at 06/26/16 1454  Gross per 24 hour  Intake              120 ml  Output                0 ml  Net              120 ml   Filed Weights   06/25/16 1618 06/26/16 0137  Weight: 65.3 kg (144 lb) 65.7 kg (144 lb 13.5 oz)    Examination:  General exam: Appears calm and comfortable  Respiratory system: Clear to auscultation. Respiratory effort normal. Cardiovascular system: S1 & S2 heard, RRR. No JVD, murmurs, rubs, gallops or clicks. No pedal edema. Gastrointestinal system: Abdomen is nondistended, soft and nontender. No organomegaly or masses felt. Normal bowel sounds heard. Central nervous system: Alert and oriented. No focal neurological deficits. Extremities: Symmetric 5 x 5 power. Skin: No rashes, lesions or ulcers Psychiatry: Judgement and insight appear normal. Mood & affect appropriate.     Data Reviewed: I have personally reviewed following labs and imaging studies  CBC:  Recent Labs Lab 06/25/16 1900 06/26/16 0418  WBC 8.0 7.1  NEUTROABS 5.4  --   HGB 11.0* 10.3*  HCT 34.4* 32.1*  MCV 95.8 94.7  PLT 169 568   Basic Metabolic Panel:  Recent Labs Lab 06/25/16 1900 06/26/16 0418  NA 142 142  K 3.4* 3.2*  CL 99* 100*  CO2 35* 33*  GLUCOSE 102* 123*  BUN 33* 29*  CREATININE 1.46* 1.11*  CALCIUM 8.8* 8.6*   GFR: Estimated Creatinine Clearance: 28.3 mL/min (A) (by C-G formula based on SCr of 1.11 mg/dL (H)). Liver Function Tests:  Recent Labs Lab 06/26/16 0418  AST 15   ALT 10*  ALKPHOS 77  BILITOT 0.7  PROT 6.2*  ALBUMIN 3.2*   No results for input(s): LIPASE, AMYLASE in the last 168 hours. No results for input(s): AMMONIA in the last 168 hours. Coagulation Profile:  Recent Labs Lab 06/24/16 06/25/16 1900  INR 2.7 2.38   Cardiac Enzymes:  Recent Labs Lab 06/25/16 1900  TROPONINI <0.03   BNP (last 3 results) No results for input(s): PROBNP in the last 8760 hours. HbA1C: No results for input(s): HGBA1C in the last 72 hours. CBG:  Recent Labs Lab 06/26/16 0127 06/26/16 0811 06/26/16 1134  GLUCAP 113* 103* 107*   Lipid Profile: No results for input(s): CHOL, HDL, LDLCALC, TRIG, CHOLHDL, LDLDIRECT in the last 72 hours. Thyroid Function Tests: No results for input(s): TSH, T4TOTAL, FREET4, T3FREE, THYROIDAB in the last 72 hours. Anemia Panel: No results for input(s): VITAMINB12, FOLATE, FERRITIN, TIBC, IRON, RETICCTPCT in the last 72 hours. Sepsis Labs:  Recent Labs Lab 06/25/16 1945  LATICACIDVEN 0.9    No results found for this or any previous visit (from the past 240 hour(s)).       Radiology Studies: Dg Chest 2 View  Result Date: 06/25/2016 CLINICAL DATA:  Dizziness and fall 3 days ago.  History of vertigo. EXAM: CHEST  2 VIEW COMPARISON:  Chest radiograph February 06, 2016 FINDINGS: Cardiac silhouette is mildly enlarged unchanged. Mildly calcified, tortuous aorta. Persistently elevated RIGHT hemidiaphragm with improved aeration RIGHT lung base. Bibasilar linear densities without pleural effusion. Mild chronic bronchitic changes. No pneumothorax. Dual lead LEFT AICD. Bilateral calcified breast implants. Osteopenia. IMPRESSION: Mild cardiomegaly and bibasilar atelectasis/ scarring. Electronically Signed   By: Elon Alas M.D.   On: 06/25/2016 18:51   US Venous Img Lower Unilateral Right  Result Date: 06/25/2016 CLINICAL DATA:  Pain and edema following fall EXAM: RIGHT LOWER EXTREMITY VENOUS DUPLEX ULTRASOUND  TECHNIQUE: Gray-scale sonography with graded compression, as well as color Doppler and duplex ultrasound were performed to evaluate the right lower extremity deep venous system from the level of the common femoral vein and including the common femoral, femoral, profunda femoral, popliteal and calf veins including the posterior tibial, peroneal and gastrocnemius veins when visible. The superficial great saphenous vein was also interrogated. Spectral Doppler was utilized to evaluate flow at rest and with distal augmentation maneuvers in the common femoral, femoral and popliteal veins. COMPARISON:  None. FINDINGS: Contralateral Common Femoral Vein: Respiratory phasicity is normal and symmetric with the symptomatic side. No evidence of thrombus. Normal compressibility. Common Femoral Vein: No evidence of thrombus. Normal compressibility, respiratory phasicity and response to augmentation. Saphenofemoral Junction: No evidence of thrombus. Normal compressibility and flow on color Doppler imaging. Profunda Femoral Vein: No evidence of thrombus. Normal compressibility and flow on color Doppler imaging. Femoral Vein: No evidence of thrombus. Normal compressibility, respiratory phasicity and response to augmentation. Popliteal  Vein: No evidence of thrombus. Normal compressibility, respiratory phasicity and response to augmentation. Calf Veins: No evidence of thrombus. Normal compressibility and flow on color Doppler imaging. Superficial Great Saphenous Vein: No evidence of thrombus. Normal compressibility and flow on color Doppler imaging. Venous Reflux:  None. Other Findings:  None. IMPRESSION: No evidence of right lower extremity deep venous thrombosis. Left common femoral vein also patent. Electronically Signed   By: Lowella Grip III M.D.   On: 06/25/2016 17:17   Dg Knee Complete 4 Views Right  Result Date: 06/25/2016 CLINICAL DATA:  [place] at home 3 days ago.  RIGHT knee and hip pain. EXAM: RIGHT KNEE - COMPLETE 4+  VIEW COMPARISON:  None. FINDINGS: No acute fracture deformity or dislocation. Mild lateral compartment narrowing with marginal spurring compatible with osteoarthrosis. Minimal intra-articular calcification most compatible with CPPD. Osteopenia without destructive bony lesions. Moderate vascular calcifications. Small probable suprapatellar joint effusion. IMPRESSION: No acute fracture deformity or dislocation. Electronically Signed   By: Elon Alas M.D.   On: 06/25/2016 17:45   Dg Hip Unilat With Pelvis 2-3 Views Right  Result Date: 06/25/2016 CLINICAL DATA:  [place] at home 3 days ago. EXAM: DG HIP (WITH OR WITHOUT PELVIS) 2-3V RIGHT COMPARISON:  RIGHT hip radiograph April 25, 2015 FINDINGS: There is no evidence of hip fracture or dislocation. There is no evidence of advanced arthropathy or other focal bone abnormality. Osteopenia. Moderate to severe vascular calcifications. Phleboliths project in the pelvis. IMPRESSION: Stable examination:  No acute fracture deformity or dislocation. If there is high clinical suspicion for occult hip fracture or if the patient refuses to bear-weight, consider further evaluation with MRI. Although CT is expeditious, evidence is lacking regarding accuracy of CT over plain film radiography. Electronically Signed   By: Elon Alas M.D.   On: 06/25/2016 17:46        Scheduled Meds: . atorvastatin  40 mg Oral QPM  . carvedilol  25 mg Oral BID WC  . cefTRIAXone (ROCEPHIN)  IV  1 g Intravenous Q24H  . folic acid  1 mg Oral Daily  . gabapentin  100 mg Oral TID  . insulin aspart  0-9 Units Subcutaneous TID WC  . isosorbide mononitrate  60 mg Oral Daily  . losartan  50 mg Oral Daily  . mouth rinse  15 mL Mouth Rinse BID  . pantoprazole  40 mg Oral Daily  . polyethylene glycol  17 g Oral Daily  . sertraline  50 mg Oral Daily  . spironolactone  25 mg Oral Daily  . warfarin  2.5 mg Oral Once  . Warfarin - Pharmacist Dosing Inpatient   Does not apply Q24H    Continuous Infusions:   LOS: 0 days    Time spent: 68mins    MEMON,JEHANZEB, MD [facility] Pager [phone]  If 7PM-7AM, please contact night-coverage [email or web address] Password [facility] 06/26/2016, 3:18 PM

## 2016-06-26 NOTE — Clinical Social Work Placement (Addendum)
   CLINICAL SOCIAL WORK PLACEMENT  NOTE  Date:  06/26/2016  Patient Details  Name: [name] MRN: [id number] Date of Birth: [birth year]  Clinical Social Work is seeking post-discharge placement for this patient at the [place] level of care (*CSW will initial, date and re-position this form in  chart as items are completed):  Yes   Patient/family provided with [place] Work Department's list of facilities offering this level of care within the geographic area requested by the patient (or if unable, by the patient's family).  Yes   Patient/family informed of their freedom to choose among providers that offer the needed level of care, that participate in Medicare, Medicaid or managed care program needed by the patient, have an available bed and are willing to accept the patient.  Yes   Patient/family informed of [place]'s ownership interest in [facility] and [facility], as well as of the fact that they are under no obligation to receive care at these facilities.  PASRR submitted to EDS on       PASRR number received on       Existing PASRR number confirmed on 06/26/16     FL2 transmitted to all facilities in geographic area requested by pt/family on 06/26/16     FL2 transmitted to all facilities within larger geographic area on       Patient informed that his/her managed care company has contracts with or will negotiate with certain facilities, including the following:            Patient/family informed of bed offers received. 06/27/2016   Patient chooses bed at      [facility]  Physician recommends and patient chooses bed at      [facility] Patient to be transferred to   on  .  Patient to be transferred to facility by      EMS  Patient family notified on   of transfer.   Name of family member notified:        PHYSICIAN       Additional Comment:    _______________________________________________ Ihor Gully,  LCSW 06/26/2016, 1:41 PM

## 2016-06-26 NOTE — Progress Notes (Signed)
ANTICOAGULATION CONSULT NOTE - Initial Consult  Pharmacy Consult for Coumadin (home dose) Indication: mural thrombus  Allergies  Allergen Reactions  . Codeine Nausea And Vomiting   Patient Measurements: Height: 4\' 11"  (149.9 cm) Weight: 144 lb 13.5 oz (65.7 kg) IBW/kg (Calculated) : 43.2  Vital Signs: Temp: 98.5 F (36.9 C) (04/05 0445) Temp Source: Oral (04/05 0445) BP: 138/53 (04/05 0445) Pulse Rate: 63 (04/05 0445)  Labs:  Recent Labs  06/24/16 06/25/16 1900 06/26/16 0418  HGB  --  11.0* 10.3*  HCT  --  34.4* 32.1*  PLT  --  169 167  LABPROT  --  26.4*  --   INR 2.7 2.38  --   CREATININE  --  1.46* 1.11*  TROPONINI  --  <0.03  --     Estimated Creatinine Clearance: 28.3 mL/min (A) (by C-G formula based on SCr of 1.11 mg/dL (H)).   Medical History: Past Medical History:  Diagnosis Date  . Arthritis    severe lower back pain  . CAD (coronary artery disease)    anterior MI in 2006 with [place]her DES to LAD  . Cardiomyopathy   . CHF (congestive heart failure) ([place])   . CKD (chronic kidney disease) stage 3, GFR 30-59 ml/min    Stage III-stage IV  . HTN (hypertension)   . LV (left ventricular) mural thrombus    on coumadin   . MI, old   . Nephrolithiasis   . OA (osteoarthritis) of hip   . On home oxygen therapy    at night  . Osteoporosis   . Other specified forms of chronic ischemic heart disease    echo (1/10) with EF 25-30%, mildly dilated LV, periapical LV aneueysm, calcified LV thombus, moderate MR. Medtronic ICD set to VVI.   Marland Kitchen Pure hypercholesterolemia     Medications:  Prescriptions Prior to Admission  Medication Sig Dispense Refill Last Dose  . acetaminophen (TYLENOL) 325 MG tablet Take 325-650 mg by mouth every 6 (six) hours as needed for mild pain.    unknown  . atorvastatin (LIPITOR) 40 MG tablet Take 1 Tablet by mouth every evening 90 tablet 3 06/24/2016 at Unknown time  . carvedilol (COREG) 25 MG tablet Take 1 Tablet by mouth 2 times a day  180 tablet 3 06/25/2016 at 0900  . cholecalciferol (VITAMIN D) 1000 UNITS tablet Take 1,000 Units by mouth daily.     06/25/2016 at Unknown time  . folic acid (FOLVITE) 1 MG tablet Take 1 mg by mouth daily.   06/25/2016 at Unknown time  . furosemide (LASIX) 40 MG tablet Take 1 tablet (40 mg total) by mouth 2 (two) times daily. 60 tablet 3 06/25/2016 at Unknown time  . gabapentin (NEURONTIN) 100 MG capsule 1 capsule 3 (three) times daily.   06/25/2016 at Unknown time  . isosorbide mononitrate (IMDUR) 60 MG 24 hr tablet Take 1 tablet (60 mg total) by mouth daily. 30 tablet 6 06/25/2016 at Unknown time  . losartan (COZAAR) 50 MG tablet Take 1 Tablet by mouth once daily 30 tablet 3 06/25/2016 at Unknown time  . Multiple Vitamins-Minerals (ICAPS AREDS FORMULA PO) Take 2 capsules by mouth daily.    06/25/2016 at Unknown time  . nitroGLYCERIN (NITROSTAT) 0.4 MG SL tablet Place 1 tablet (0.4 mg total) under the tongue every 5 (five) minutes as needed for chest pain. 25 tablet 3 unknown  . pantoprazole (PROTONIX) 40 MG tablet Take 1 Tablet by mouth once daily 90 tablet 3 06/25/2016 at Unknown  time  . Phenazopyridine HCl (AZO-STANDARD PO) Take 2 tablets by mouth daily as needed.   06/25/2016 at Unknown time  . polyethylene glycol (MIRALAX / GLYCOLAX) packet Take 17 g by mouth daily. 14 each 0 06/25/2016 at Unknown time  . saxagliptin HCl (ONGLYZA) 2.5 MG TABS tablet Take 2.5 mg by mouth daily.   06/25/2016 at Unknown time  . sertraline (ZOLOFT) 50 MG tablet Take 50 mg by mouth daily.    06/25/2016 at Unknown time  . spironolactone (ALDACTONE) 25 MG tablet Take 1 Tablet by mouth once daily 90 tablet 3 06/25/2016 at Unknown time  . traMADol (ULTRAM) 50 MG tablet Take 1 tablet (50 mg total) by mouth every 6 (six) hours as needed for moderate pain. 10 tablet 0 06/25/2016 at Unknown time  . warfarin (COUMADIN) 2.5 MG tablet TAKE AS DIRECTED BY ANTICOAGULATION CLINIC 30 tablet 3 06/25/2016 at 0900   Assessment: 83yo female on chronic Coumadin  for h/o mural thrombus.  INR therapeutic on admission.  Anticoagulation Dose Instructions as of 06/24/2016    Total Sun Mon Tue Wed Thu Fri Sat  New Dose 13.75 mg 2.5 mg 1.25 mg 2.5 mg 1.25 mg 2.5 mg 1.25 mg 2.5 mg    (2.5 mg x 1) (2.5 mg x 0.5) (2.5 mg x 1) (2.5 mg x 0.5) (2.5 mg x 1) (2.5 mg x 0.5) (2.5 mg x 1)   Goal of Therapy:  INR 2-3 Monitor platelets by anticoagulation protocol: Yes   Plan:  Coumadin 2.5mg  today x 1 (home dose) INR daily  Hart Robinsons A 06/26/2016,8:34 AM

## 2016-06-26 NOTE — Evaluation (Signed)
Physical Therapy Evaluation Patient Details Name: [name] MRN: [id number] DOB: [birth year] Today's Date: 06/26/2016   History of Present Illness  83 y.o. female, with history of CAD,  CHF, hypertension, chronic anticoagulation for LV mural  thrombus who was brought to the hospital after patient was having difficulty walking at home. Patient says that she fell 3 days ago when she was standing in her kitchen and slid down to the floor. She fell onto her buttock and right hip. Also complained of pain in right hip and knee. Patient says that she was able to walk 3 days ago but today she was unable to bear weight on right side.  x-rays of hip and knee shows no acute abnormality. Ultrasound of the right lower extremity shows no DVT.  Clinical Impression  Pt received in bed, and was agreeable to PT evaluation.  Pt states that although she lives alone, she has 24/7 supervision with her dtr staying with her at night and a caregiver during the day.  She uses a RW for ambulation at baseline, and has been requiring assistance for both dressing and bathing.  She has had multiple falls in the past 6 months.  During today's PT evaluation she expressed pain in her R knee, but she was able to weight bear to ambulate 61ft with RW.  She is recommended for SNF as she continues to be a high fall risk, and she is agreeable to this.      Follow Up Recommendations SNF    Equipment Recommendations  None recommended by PT    Recommendations for Other Services       Precautions / Restrictions Precautions Precautions: Fall Precaution Comments: Pt reports multiple previous falls.  Restrictions Weight Bearing Restrictions: No      Mobility  Bed Mobility Overal bed mobility: Needs Assistance       Supine to sit: Supervision;HOB elevated     General bed mobility comments: increased time  Transfers Overall transfer level: Needs assistance Equipment used: Rolling walker (2 wheeled) Transfers: Sit to/from  Stand Sit to Stand: Min guard         General transfer comment: increased time  Ambulation/Gait Ambulation/Gait assistance: Min guard Ambulation Distance (Feet): 8 Feet Assistive device: Rolling walker (2 wheeled) Gait Pattern/deviations: Step-to pattern;Decreased weight shift to right;Antalgic   Gait velocity interpretation: <1.8 ft/sec, indicative of risk for recurrent falls    Stairs            Wheelchair Mobility    Modified Rankin (Stroke Patients Only)       Balance Overall balance assessment: History of Falls;Needs assistance Sitting-balance support: Bilateral upper extremity supported;Feet supported Sitting balance-Leahy Scale: Good     Standing balance support: Bilateral upper extremity supported Standing balance-Leahy Scale: Fair                               Pertinent Vitals/Pain Pain Assessment: Faces Faces Pain Scale: Hurts even more Pain Location: R hip and knee Pain Descriptors / Indicators: Sharp;Aching Pain Intervention(s): Limited activity within patient's tolerance;Monitored during session;Repositioned    Home Living   Living Arrangements: Alone Available Help at Discharge: Available 24 hours/day;Personal care attendant (Pt's dtr stays the night, and she has a caretaker that is with her during the day. ) Type of Home: House Home Access: Stairs to enter Entrance Stairs-Rails: Right   Home Layout: One level Home Equipment: Clinical cytogeneticist - 2 wheels;Grab bars - tub/shower;Other (comment) (  Pt sleeps in a recliner)      Prior Function Level of Independence: Needs assistance   Gait / Transfers Assistance Needed: Pt uses a RW for ambulation.  ADL's / Homemaking Assistance Needed: Caregiver assists with dressing and bathing.         Hand Dominance   Dominant Hand: Right    Extremity/Trunk Assessment   Upper Extremity Assessment Upper Extremity Assessment: Overall WFL for tasks assessed    Lower Extremity  Assessment Lower Extremity Assessment: Generalized weakness       Communication   Communication: No difficulties  Cognition Arousal/Alertness: Awake/alert Behavior During Therapy: WFL for tasks assessed/performed Overall Cognitive Status: Within Functional Limits for tasks assessed                                        General Comments      Exercises     Assessment/Plan    PT Assessment Patient needs continued PT services  PT Problem List Decreased strength;Decreased activity tolerance;Decreased balance;Decreased mobility       PT Treatment Interventions DME instruction;Gait training;Functional mobility training;Therapeutic activities;Therapeutic exercise;Patient/family education;Balance training    PT Goals (Current goals can be found in the Care Plan section)  Acute Rehab PT Goals Patient Stated Goal: To get stronger PT Goal Formulation: With patient Time For Goal Achievement: 07/03/16 Potential to Achieve Goals: Good    Frequency Min 3X/week   Barriers to discharge        Co-evaluation               End of Session Equipment Utilized During Treatment: Gait belt;Oxygen Activity Tolerance: Patient limited by fatigue;Patient limited by pain Patient left: in chair;with call bell/phone within reach Nurse Communication: Mobility status (mobility sheet left in the room. ) PT Visit Diagnosis: Muscle weakness (generalized) (M62.81);Other abnormalities of gait and mobility (R26.89)    Time: 8270-7867 PT Time Calculation (min) (ACUTE ONLY): 17 min   Charges:   PT Evaluation $PT Eval Low Complexity: 1 Procedure     PT G Codes:   PT G-Codes **NOT FOR INPATIENT CLASS** Functional Assessment Tool Used: AM-PAC 6 Clicks Basic Mobility Functional Limitation: Mobility: Walking and moving around Mobility: Walking and Moving Around Current Status (J4492): At least 40 percent but less than 60 percent impaired, limited or restricted Mobility: Walking  and Moving Around Goal Status [id number]): At least 20 percent but less than 40 percent impaired, limited or restricted    Beth Maciah Schweigert, PT, DPT X: [phone]

## 2016-06-26 NOTE — NC FL2 (Signed)
[place] LEVEL OF CARE SCREENING TOOL     IDENTIFICATION  Patient Name: [name] Birthdate: 10/13/1925 Sex: female Admission Date (Current Location): 06/25/2016  [facility] and [place] Number:  [employer] and Address:  [place] [address] [place]      Provider Number: [id number]  Attending Physician Name and Address:  Kathie Dike, MD  Relative Name and Phone Number:       Current Level of Care: Hospital Recommended Level of Care: [place] Prior Approval Number:    Date Approved/Denied:   PASRR Number: [id number]  Discharge Plan: SNF    Current Diagnoses: Patient Active Problem List   Diagnosis Date Noted  . UTI (urinary tract infection) 06/25/2016  . Weakness generalized 02/06/2016  . CAP (community acquired pneumonia) 02/06/2016  . CKD (chronic kidney disease) stage 3, GFR 30-59 ml/min 04/26/2015  . Urinary tract infection 04/25/2015  . Cardiomyopathy, ischemic 04/25/2015  . Fecal impaction ([place]) 04/25/2015  . Degenerative joint disease 04/25/2015  . Chest pain, precordial 08/28/2014  . Encounter for therapeutic drug monitoring 05/02/2013  . CKD (chronic kidney disease) 10/22/2011  . Angina at rest [facility]) 10/08/2011  . Lower urinary tract infectious disease 10/08/2011  . Ventricular tachycardia ([place]) 06/10/2011  . Systolic CHF, chronic ([place]) 12/11/2010  . LV (left ventricular) mural thrombus 12/11/2010  . Long term current use of anticoagulant therapy 06/25/2010  . ICD-Medtronic 05/01/2009  . HYPERCHOLESTEROLEMIA 06/19/2008  . Hyperlipidemia 06/19/2008  . HTN (hypertension) 06/19/2008  . Coronary artery disease due to calcified coronary lesion 06/19/2008  . Aneurysm of heart wall 06/19/2008  . UNSPECIFIED SYSTOLIC HEART FAILURE 50/05/7046  . ARTHRITIS 06/19/2008  . SHORTNESS OF BREATH 06/19/2008    Orientation RESPIRATION BLADDER Height & Weight     Self, Time, Situation,  Place  O2 (2L) Continent Weight: 144 lb 13.5 oz (65.7 kg) Height:  4\' 11"  (149.9 cm)  BEHAVIORAL SYMPTOMS/MOOD NEUROLOGICAL BOWEL NUTRITION STATUS      Continent Diet (Heart Healthy/Carb Modified. )  AMBULATORY STATUS COMMUNICATION OF NEEDS Skin   Limited Assist Verbally Normal                       Personal Care Assistance Level of Assistance  Bathing, Feeding, Dressing Bathing Assistance: Limited assistance Feeding assistance: Independent       Functional Limitations Info  Sight, Hearing, Speech Sight Info: Adequate Hearing Info: Adequate Speech Info: Adequate    SPECIAL CARE FACTORS FREQUENCY  PT (By licensed PT)     PT Frequency: 5x/week              Contractures Contractures Info: Not present    Additional Factors Info  Psychotropic Code Status Info: DNR Allergies Info: Codeine Psychotropic Info: Zoloft         Current Medications (06/26/2016):  This is the current hospital active medication list Current Facility-Administered Medications  Medication Dose Route Frequency Provider Last Rate Last Dose  . acetaminophen (TYLENOL) tablet 325-650 mg  325-650 mg Oral Q6H PRN Oswald Hillock, MD   650 mg at 06/26/16 0210  . atorvastatin (LIPITOR) tablet 40 mg  40 mg Oral QPM Oswald Hillock, MD      . carvedilol (COREG) tablet 25 mg  25 mg Oral BID WC Oswald Hillock, MD   25 mg at 06/26/16 0930  . cefTRIAXone (ROCEPHIN) 1 g in dextrose 5 % 50 mL IVPB  1 g Intravenous Q24H [employer]  Memon, MD      . folic acid (FOLVITE) tablet 1 mg  1 mg Oral Daily Oswald Hillock, MD   1 mg at 06/26/16 0930  . gabapentin (NEURONTIN) capsule 100 mg  100 mg Oral TID Oswald Hillock, MD   100 mg at 06/26/16 0930  . insulin aspart (novoLOG) injection 0-9 Units  0-9 Units Subcutaneous TID WC Oswald Hillock, MD      . isosorbide mononitrate (IMDUR) 24 hr tablet 60 mg  60 mg Oral Daily Oswald Hillock, MD   60 mg at 06/26/16 0930  . losartan (COZAAR) tablet 50 mg  50 mg Oral Daily Oswald Hillock, MD   50 mg  at 06/26/16 0930  . MEDLINE mouth rinse  15 mL Mouth Rinse BID Oswald Hillock, MD   15 mL at 06/26/16 1000  . pantoprazole (PROTONIX) EC tablet 40 mg  40 mg Oral Daily Oswald Hillock, MD   40 mg at 06/26/16 0931  . polyethylene glycol (MIRALAX / GLYCOLAX) packet 17 g  17 g Oral Daily Oswald Hillock, MD   17 g at 06/26/16 0929  . sertraline (ZOLOFT) tablet 50 mg  50 mg Oral Daily Oswald Hillock, MD   50 mg at 06/26/16 0931  . spironolactone (ALDACTONE) tablet 25 mg  25 mg Oral Daily Oswald Hillock, MD   25 mg at 06/26/16 0931  . warfarin (COUMADIN) tablet 2.5 mg  2.5 mg Oral Once Kathie Dike, MD      . Warfarin - Pharmacist Dosing Inpatient   Does not apply Q24H Kathie Dike, MD         Discharge Medications: Please see discharge summary for a list of discharge medications.  Relevant Imaging Results:  Relevant Lab Results:   Additional Information SSN [id number]  Rache Klimaszewski, Clydene Pugh, LCSW

## 2016-06-26 NOTE — Clinical Social Work Note (Signed)
Clinical Social Work Assessment  Patient Details  Name: [name] MRN: [id number] Date of Birth: January 06, 1926  Date of referral:  06/26/16               Reason for consult:  Discharge Planning                Permission sought to share information with:    Permission granted to share information::     Name::        Agency::     Relationship::     Contact Information:  Buford Dresser, was at bedside.   Housing/Transportation Living arrangements for the past 2 months:  [place] of Information:  Patient, Other (Comment Required) Saddie Benders, Arville Go) Patient Interpreter Needed:  None Criminal Activity/Legal Involvement Pertinent to Current Situation/Hospitalization:  No - Comment as needed Significant Relationships:  Adult Children, Other Family Members Lives with:  Self Do you feel safe going back to the place where you live?  Yes Need for family participation in patient care:  No (Coment)  Care giving concerns:  None identified.    Social Worker assessment / plan:  Patient lives alone. Her daughter stays with her at night and she has a caregiver during the day. She uses a rolling walker and requires assistance with bathing and dressing.  Patient has had multiple falls recently. Patient was agreeable to placement and stated that she desired to go to [facility].    Employment status:  Retired Nurse, adult PT Recommendations:  [place] / Referral to community resources:  [place]  Patient/Family's Response to care:  Patient is agreeable to go to SNF at [facility].   Patient/Family's Understanding of and Emotional Response to Diagnosis, Current Treatment, and Prognosis:  Patient and niece, who was at bedside, understand patients diagnosis, treatment and prognosis.    Emotional Assessment Appearance:  Appears stated age Attitude/Demeanor/Rapport:   (Cooperative) Affect  (typically observed):  Accepting Orientation:  Oriented to Self, Oriented to Place, Oriented to  Time, Oriented to Situation Alcohol / Substance use:    Psych involvement (Current and /or in the community):  No (Comment)  Discharge Needs  Concerns to be addressed:  Discharge Planning Concerns Readmission within the last 30 days:  No Current discharge risk:  None Barriers to Discharge:  No Barriers Identified   Ihor Gully, LCSW 06/26/2016, 1:37 PM

## 2016-06-26 NOTE — Care Management Note (Signed)
Case Management Note  Patient Details  Name: ROSENDA GEFFRARD MRN: [id number] Date of Birth: [birth year]  Subjective/Objective:    Adm with UTI, recurrent falls. Lives alone, has private duty aide, daughter stays with her at night. PT has recommended SNF, and is agreeable. Reports she has been to [facility] previously. CSW notified.               Action/Plan: Anticipate DC to SNF. Will follow.    Expected Discharge Date:     06/26/2016             Expected Discharge Plan:  Skilled Nursing Facility  In-House Referral:  Clinical Social Work  Discharge planning Services  CM Consult  Post Acute Care Choice:    Choice offered to:     DME Arranged:    DME Agency:     HH Arranged:    [place] Agency:     Status of Service:  In process, will continue to follow  If discussed at Long Length of Stay Meetings, dates discussed:    Additional Comments:  Allex Lapoint, Chauncey Reading, RN 06/26/2016, 11:30 AM

## 2016-06-27 DIAGNOSIS — I5042 Chronic combined systolic (congestive) and diastolic (congestive) heart failure: Secondary | ICD-10-CM | POA: Diagnosis not present

## 2016-06-27 DIAGNOSIS — N390 Urinary tract infection, site not specified: Secondary | ICD-10-CM | POA: Diagnosis not present

## 2016-06-27 DIAGNOSIS — J9611 Chronic respiratory failure with hypoxia: Secondary | ICD-10-CM | POA: Diagnosis not present

## 2016-06-27 DIAGNOSIS — I10 Essential (primary) hypertension: Secondary | ICD-10-CM | POA: Diagnosis not present

## 2016-06-27 DIAGNOSIS — N183 Chronic kidney disease, stage 3 (moderate): Secondary | ICD-10-CM | POA: Diagnosis not present

## 2016-06-27 LAB — BASIC METABOLIC PANEL
ANION GAP: 6 (ref 5–15)
BUN: 23 mg/dL — ABNORMAL HIGH (ref 6–20)
CALCIUM: 8.9 mg/dL (ref 8.9–10.3)
CO2: 34 mmol/L — AB (ref 22–32)
Chloride: 103 mmol/L (ref 101–111)
Creatinine, Ser: 0.97 mg/dL (ref 0.44–1.00)
GFR, EST AFRICAN AMERICAN: 58 mL/min — AB (ref >60)
GFR, EST NON AFRICAN AMERICAN: 50 mL/min — AB (ref >60)
Glucose, Bld: 124 mg/dL — ABNORMAL HIGH (ref 65–99)
POTASSIUM: 3.7 mmol/L (ref 3.5–5.1)
Sodium: 143 mmol/L (ref 135–145)

## 2016-06-27 LAB — GLUCOSE, CAPILLARY
Glucose-Capillary: 109 mg/dL — ABNORMAL HIGH (ref 65–99)
Glucose-Capillary: 164 mg/dL — ABNORMAL HIGH (ref 65–99)

## 2016-06-27 LAB — CBC
HEMATOCRIT: 32.3 % — AB (ref 36.0–46.0)
Hemoglobin: 10.2 g/dL — ABNORMAL LOW (ref 12.0–15.0)
MCH: 30 pg (ref 26.0–34.0)
MCHC: 31.6 g/dL (ref 30.0–36.0)
MCV: 95 fL (ref 78.0–100.0)
Platelets: 178 10*3/uL (ref 150–400)
RBC: 3.4 MIL/uL — AB (ref 3.87–5.11)
RDW: 13.1 % (ref 11.5–15.5)
WBC: 5 10*3/uL (ref 4.0–10.5)

## 2016-06-27 LAB — PROTIME-INR
INR: 2.55
Prothrombin Time: 27.9 seconds — ABNORMAL HIGH (ref 11.4–15.2)

## 2016-06-27 LAB — HEMOGLOBIN A1C
HEMOGLOBIN A1C: 6.3 % — AB (ref 4.8–5.6)
Mean Plasma Glucose: 134 mg/dL

## 2016-06-27 MED ORDER — NITROFURANTOIN MONOHYD MACRO 100 MG PO CAPS: 100.0000 mg | ORAL_CAPSULE | Freq: Two times a day (BID) | ORAL | 0 refills | Status: DC

## 2016-06-27 MED ORDER — WARFARIN SODIUM 2.5 MG PO TABS
1.2500 mg | ORAL_TABLET | Freq: Once | ORAL | Status: AC
Start: 2016-06-27 — End: 2016-06-27
  Administered 2016-06-27: 1.25 mg via ORAL
  Filled 2016-06-27: qty 1

## 2016-06-27 MED ORDER — CIPROFLOXACIN HCL 250 MG PO TABS: 250.0000 mg | ORAL_TABLET | Freq: Two times a day (BID) | ORAL | 0 refills | Status: DC

## 2016-06-27 NOTE — Discharge Summary (Signed)
Physician Discharge Summary  [name] BZJ:[id number] DOB: Jan 09, 1926 [name]: 06/25/2016  PCP: [name], MD  Admit date: 06/25/2016 Discharge date: 06/27/2016  Admitted From: Home Disposition:  Patient was advised to go to skilled nursing facility, but she refused and decided to go home.  Recommendations for Outpatient Follow-up:  1. Follow up with PCP in 1-2 weeks 2. Please obtain BMP/CBC in one week   Discharge Condition:Stable CODE STATUS:DO NOT RESUSCITATE Diet recommendation: Heart Healthy / Carb Modified   Brief/Interim Summary: 83 year old female with history of CHF, chronic respiratory failure and chronic kidney disease stage III, presents to the hospital with difficulty walking and repeated falls. She complained of pain in her right hip and knee. She did complain of dysuria and was found to have a urinary tract infection. She was started on Rocephin. She has been admitted for further treatments.  Discharge Diagnoses:  Active Problems:   HTN (hypertension)   Coronary artery disease due to calcified coronary lesion   Long term current use of anticoagulant therapy   CKD (chronic kidney disease) stage 3, GFR 30-59 ml/min   Weakness generalized   UTI (urinary tract infection)   Chronic combined systolic and diastolic CHF (congestive heart failure) (HCC)   Chronic respiratory failure with hypoxia ([place])  1. Urinary tract infection. Abnormal UA with positive nitrates. Urine culture is in process at the time of discharge. She was treated with Rocephin during her hospital stay. Based on prior urine cultures, she'll be discharged on a course of nitrofurantoin and ciprofloxacin.  2. Generalized weakness. Patient has been having frequent falls. Seen by physical therapy recommended skilled nursing facility placement. Patient refused nursing facility placement and decided to go home with home health services.  3. History of LV mural thrombus. On Coumadin. INR was therapeutic  4.  Hypokalemia. Replace. Continue spironolactone  5. Diabetes. Blood sugars were stable. Continue oral hypoglycemics at home.  6. Hypertension. Continue on losartan. Blood pressure currently stable.  7. Chronic combined CHF. Ejection fraction of 25-30% on last echocardiogram from 2016. Appears compensated at this time. Continue on spironolactone, losartan, Imdur and beta blockers. Resume Lasix on discharge  8. Chronic respiratory failure with hypoxia. Chronically on 2 L of oxygen. At baseline  9. Chronic kidney disease stage III. Creatinine appears to be near baseline. Continue to monitor.  Discharge Instructions  Discharge Instructions    Diet - low sodium heart healthy    Complete by:  As directed    Increase activity slowly    Complete by:  As directed      Allergies as of 06/27/2016      Reactions   Codeine Nausea And Vomiting      Medication List    TAKE these medications   acetaminophen 325 MG tablet Commonly known as:  TYLENOL Take 325-650 mg by mouth every 6 (six) hours as needed for mild pain.   atorvastatin 40 MG tablet Commonly known as:  LIPITOR Take 1 Tablet by mouth every evening   AZO-STANDARD PO Take 2 tablets by mouth daily as needed.   carvedilol 25 MG tablet Commonly known as:  COREG Take 1 Tablet by mouth 2 times a day   cholecalciferol 1000 units tablet Commonly known as:  VITAMIN D Take 1,000 Units by mouth daily.   ciprofloxacin 250 MG tablet Commonly known as:  CIPRO Take 1 tablet (250 mg total) by mouth 2 (two) times daily.   folic acid 1 MG tablet Commonly known as:  FOLVITE Take 1 mg  by mouth daily.   furosemide 40 MG tablet Commonly known as:  LASIX Take 1 tablet (40 mg total) by mouth 2 (two) times daily.   gabapentin 100 MG capsule Commonly known as:  NEURONTIN 1 capsule 3 (three) times daily.   ICAPS AREDS FORMULA PO Take 2 capsules by mouth daily.   isosorbide mononitrate 60 MG 24 hr tablet Commonly known as:   IMDUR Take 1 tablet (60 mg total) by mouth daily.   losartan 50 MG tablet Commonly known as:  COZAAR Take 1 Tablet by mouth once daily   nitrofurantoin (macrocrystal-monohydrate) 100 MG capsule Commonly known as:  MACROBID Take 1 capsule (100 mg total) by mouth 2 (two) times daily.   nitroGLYCERIN 0.4 MG SL tablet Commonly known as:  NITROSTAT Place 1 tablet (0.4 mg total) under the tongue every 5 (five) minutes as needed for chest pain.   pantoprazole 40 MG tablet Commonly known as:  PROTONIX Take 1 Tablet by mouth once daily   polyethylene glycol packet Commonly known as:  MIRALAX / GLYCOLAX Take 17 g by mouth daily.   saxagliptin HCl 2.5 MG Tabs tablet Commonly known as:  ONGLYZA Take 2.5 mg by mouth daily.   sertraline 50 MG tablet Commonly known as:  ZOLOFT Take 50 mg by mouth daily.   spironolactone 25 MG tablet Commonly known as:  ALDACTONE Take 1 Tablet by mouth once daily   traMADol 50 MG tablet Commonly known as:  ULTRAM Take 1 tablet (50 mg total) by mouth every 6 (six) hours as needed for moderate pain.   warfarin 2.5 MG tablet Commonly known as:  COUMADIN TAKE AS DIRECTED BY ANTICOAGULATION CLINIC What changed:  See the new instructions.       Contact information for follow-up providers    [name], MD. Schedule an appointment as soon as possible for a visit in 1 week.   Specialty:  Internal Medicine Contact information: [address] [place] [address] [phone]            Contact information for after-discharge care    Destination    [facility] .   Specialty:  [place] information: [place] [phone]                 Allergies  Allergen Reactions  . Codeine Nausea And Vomiting    Consultations:     Procedures/Studies: Dg Chest 2 View  Result Date: 06/25/2016 CLINICAL DATA:  Dizziness and fall 3 days ago.  History of  vertigo. EXAM: CHEST  2 VIEW COMPARISON:  Chest radiograph February 06, 2016 FINDINGS: Cardiac silhouette is mildly enlarged unchanged. Mildly calcified, tortuous aorta. Persistently elevated RIGHT hemidiaphragm with improved aeration RIGHT lung base. Bibasilar linear densities without pleural effusion. Mild chronic bronchitic changes. No pneumothorax. Dual lead LEFT AICD. Bilateral calcified breast implants. Osteopenia. IMPRESSION: Mild cardiomegaly and bibasilar atelectasis/ scarring. Electronically Signed   By: Elon Alas M.D.   On: 06/25/2016 18:51   US Venous Img Lower Unilateral Right  Result Date: 06/25/2016 CLINICAL DATA:  Pain and edema following fall EXAM: RIGHT LOWER EXTREMITY VENOUS DUPLEX ULTRASOUND TECHNIQUE: Gray-scale sonography with graded compression, as well as color Doppler and duplex ultrasound were performed to evaluate the right lower extremity deep venous system from the level of the common femoral vein and including the common femoral, femoral, profunda femoral, popliteal and calf veins including the posterior tibial, peroneal and gastrocnemius veins when visible. The  superficial great saphenous vein was also interrogated. Spectral Doppler was utilized to evaluate flow at rest and with distal augmentation maneuvers in the common femoral, femoral and popliteal veins. COMPARISON:  None. FINDINGS: Contralateral Common Femoral Vein: Respiratory phasicity is normal and symmetric with the symptomatic side. No evidence of thrombus. Normal compressibility. Common Femoral Vein: No evidence of thrombus. Normal compressibility, respiratory phasicity and response to augmentation. Saphenofemoral Junction: No evidence of thrombus. Normal compressibility and flow on color Doppler imaging. Profunda Femoral Vein: No evidence of thrombus. Normal compressibility and flow on color Doppler imaging. Femoral Vein: No evidence of thrombus. Normal compressibility, respiratory phasicity and response to  augmentation. Popliteal Vein: No evidence of thrombus. Normal compressibility, respiratory phasicity and response to augmentation. Calf Veins: No evidence of thrombus. Normal compressibility and flow on color Doppler imaging. Superficial Great Saphenous Vein: No evidence of thrombus. Normal compressibility and flow on color Doppler imaging. Venous Reflux:  None. Other Findings:  None. IMPRESSION: No evidence of right lower extremity deep venous thrombosis. Left common femoral vein also patent. Electronically Signed   By: Lowella Grip III M.D.   On: 06/25/2016 17:17   Dg Knee Complete 4 Views Right  Result Date: 06/25/2016 CLINICAL DATA:  [place] at home 3 days ago.  RIGHT knee and hip pain. EXAM: RIGHT KNEE - COMPLETE 4+ VIEW COMPARISON:  None. FINDINGS: No acute fracture deformity or dislocation. Mild lateral compartment narrowing with marginal spurring compatible with osteoarthrosis. Minimal intra-articular calcification most compatible with CPPD. Osteopenia without destructive bony lesions. Moderate vascular calcifications. Small probable suprapatellar joint effusion. IMPRESSION: No acute fracture deformity or dislocation. Electronically Signed   By: Elon Alas M.D.   On: 06/25/2016 17:45   Dg Hip Unilat With Pelvis 2-3 Views Right  Result Date: 06/25/2016 CLINICAL DATA:  [place] at home 3 days ago. EXAM: DG HIP (WITH OR WITHOUT PELVIS) 2-3V RIGHT COMPARISON:  RIGHT hip radiograph April 25, 2015 FINDINGS: There is no evidence of hip fracture or dislocation. There is no evidence of advanced arthropathy or other focal bone abnormality. Osteopenia. Moderate to severe vascular calcifications. Phleboliths project in the pelvis. IMPRESSION: Stable examination:  No acute fracture deformity or dislocation. If there is high clinical suspicion for occult hip fracture or if the patient refuses to bear-weight, consider further evaluation with MRI. Although CT is expeditious, evidence is lacking regarding  accuracy of CT over plain film radiography. Electronically Signed   By: Elon Alas M.D.   On: 06/25/2016 17:46       Subjective: Feeling better today. Feels less weak.  Discharge Exam: Vitals:   06/27/16 0600 06/27/16 1324  BP: (!) 157/74 (!) 148/68  Pulse: 64 62  Resp: 20 18  Temp: 98.3 F (36.8 C) 98.5 F (36.9 C)   Vitals:   06/26/16 1332 06/26/16 2159 06/27/16 0600 06/27/16 1324  BP: (!) 123/46 (!) 141/53 (!) 157/74 (!) 148/68  Pulse: 61 65 64 62  Resp: 20 20 20 18   Temp: 98.2 F (36.8 C) 98.3 F (36.8 C) 98.3 F (36.8 C) 98.5 F (36.9 C)  TempSrc: Oral Oral Oral Oral  SpO2: 95% 96% 96% 98%  Weight:      Height:        General: Pt is alert, awake, not in acute distress Cardiovascular: RRR, S1/S2 +, no rubs, no gallops Respiratory: CTA bilaterally, no wheezing, no rhonchi Abdominal: Soft, NT, ND, bowel sounds + Extremities: no edema, no cyanosis    The results of significant diagnostics from this hospitalization (including  imaging, microbiology, ancillary and laboratory) are listed below for reference.     Microbiology: Recent Results (from the past 240 hour(s))  Urine culture     Status: None (Preliminary result)   Collection Time: 06/25/16  6:07 PM  Result Value Ref Range Status   Specimen Description URINE, RANDOM  Final   Special Requests NONE  Final   Culture   Final    CULTURE REINCUBATED FOR BETTER GROWTH Performed at [facility], [address], [place], [place] [address]    Report Status PENDING  Incomplete     Labs: BNP (last 3 results)  Recent Labs  02/07/16 1629 06/25/16 1900  BNP 633.0* 557.3*   Basic Metabolic Panel:  Recent Labs Lab 06/25/16 1900 06/26/16 0418 06/27/16 0451  NA 142 142 143  K 3.4* 3.2* 3.7  CL 99* 100* 103  CO2 35* 33* 34*  GLUCOSE 102* 123* 124*  BUN 33* 29* 23*  CREATININE 1.46* 1.11* 0.97  CALCIUM 8.8* 8.6* 8.9   Liver Function Tests:  Recent Labs Lab 06/26/16 0418  AST 15   ALT 10*  ALKPHOS 77  BILITOT 0.7  PROT 6.2*  ALBUMIN 3.2*   No results for input(s): LIPASE, AMYLASE in the last 168 hours. No results for input(s): AMMONIA in the last 168 hours. CBC:  Recent Labs Lab 06/25/16 1900 06/26/16 0418 06/27/16 0451  WBC 8.0 7.1 5.0  NEUTROABS 5.4  --   --   HGB 11.0* 10.3* 10.2*  HCT 34.4* 32.1* 32.3*  MCV 95.8 94.7 95.0  PLT 169 167 178   Cardiac Enzymes:  Recent Labs Lab 06/25/16 1900  TROPONINI <0.03   BNP: Invalid input(s): POCBNP CBG:  Recent Labs Lab 06/26/16 1134 06/26/16 1708 06/26/16 2157 06/27/16 0740 06/27/16 1059  GLUCAP 107* 112* 154* 109* 164*   D-Dimer No results for input(s): DDIMER in the last 72 hours. Hgb A1c  Recent Labs  06/25/16 1945  HGBA1C 6.3*   Lipid Profile No results for input(s): CHOL, HDL, LDLCALC, TRIG, CHOLHDL, LDLDIRECT in the last 72 hours. Thyroid function studies No results for input(s): TSH, T4TOTAL, T3FREE, THYROIDAB in the last 72 hours.  Invalid input(s): FREET3 Anemia work up No results for input(s): VITAMINB12, FOLATE, FERRITIN, TIBC, IRON, RETICCTPCT in the last 72 hours. Urinalysis    Component Value Date/Time   COLORURINE YELLOW 06/25/2016 1807   APPEARANCEUR CLOUDY (A) 06/25/2016 1807   LABSPEC 1.010 06/25/2016 1807   PHURINE 5.0 06/25/2016 1807   GLUCOSEU NEGATIVE 06/25/2016 1807   HGBUR NEGATIVE 06/25/2016 1807   BILIRUBINUR NEGATIVE 06/25/2016 1807   KETONESUR NEGATIVE 06/25/2016 1807   PROTEINUR NEGATIVE 06/25/2016 1807   UROBILINOGEN 4.0 (H) 08/28/2014 1119   NITRITE POSITIVE (A) 06/25/2016 1807   LEUKOCYTESUR MODERATE (A) 06/25/2016 1807   Sepsis Labs Invalid input(s): PROCALCITONIN,  WBC,  LACTICIDVEN Microbiology Recent Results (from the past 240 hour(s))  Urine culture     Status: None (Preliminary result)   Collection Time: 06/25/16  6:07 PM  Result Value Ref Range Status   Specimen Description URINE, RANDOM  Final   Special Requests NONE  Final    Culture   Final    CULTURE REINCUBATED FOR BETTER GROWTH Performed at [facility], [place] [address] [place], [place] [address]    Report Status PENDING  Incomplete     Time coordinating discharge: Over 30 minutes  SIGNED:   Kathie Dike, MD  [facility] 06/27/2016, 7:56 PM Pager   If 7PM-7AM, please contact  night-coverage [email or web address] Password [email or web address]

## 2016-06-27 NOTE — Progress Notes (Signed)
qPhysical Therapy Treatment Patient Details Name: [name] MRN: [id number] DOB: 04-30-1925 Today's Date: 06/27/2016    History of Present Illness 83 y.o. female, with history of CAD,  CHF, hypertension, chronic anticoagulation for LV mural  thrombus who was brought to the hospital after patient was having difficulty walking at home. Patient says that she fell 3 days ago when she was standing in her kitchen and slid down to the floor. She fell onto her buttock and right hip. Also complained of pain in right hip and knee. Patient says that she was able to walk 3 days ago but today she was unable to bear weight on right side.  x-rays of hip and knee shows no acute abnormality. Ultrasound of the right lower extremity shows no DVT.    PT Comments    Pt received sitting on the BSC, dtr present, and pt is agreeable to PT tx.  Pt demonstrated improved transfers with cues for hand placement.  She was also able to increase gait distance to 10ft with RW and min A due to unsteadiness.  Continue to recommend SNF at this time.     Follow Up Recommendations  SNF     Equipment Recommendations  None recommended by PT    Recommendations for Other Services       Precautions / Restrictions Precautions Precautions: Fall Precaution Comments: Pt reports multiple previous falls.  Restrictions Weight Bearing Restrictions: No    Mobility  Bed Mobility                  Transfers Overall transfer level: Needs assistance Equipment used: Rolling walker (2 wheeled) Transfers: Sit to/from Stand Sit to Stand: Min guard         General transfer comment: Vc's for hand placement each time.  Sit<>stand off BSC<>chair.  After ambualtion pt remained sitting up in the chair, but requires constant cues for hand placement during transfer.  Ambulation/Gait Ambulation/Gait assistance: Min guard Ambulation Distance (Feet): 60 Feet Assistive device: Rolling walker (2 wheeled) Gait Pattern/deviations:  Step-to pattern;Decreased weight shift to right;Antalgic         Stairs            Wheelchair Mobility    Modified Rankin (Stroke Patients Only)       Balance Overall balance assessment: History of Falls;Needs assistance Sitting-balance support: Bilateral upper extremity supported;Feet supported Sitting balance-Leahy Scale: Good     Standing balance support: Bilateral upper extremity supported Standing balance-Leahy Scale: Fair                              Cognition Arousal/Alertness: Awake/alert Behavior During Therapy: WFL for tasks assessed/performed Overall Cognitive Status: Within Functional Limits for tasks assessed                                        Exercises General Exercises - Upper Extremity Shoulder Flexion: Self ROM;Both;10 reps;Seated;Limitations Shoulder Flexion Limitations: 50% range due to chronic R shoulder issues.  General Exercises - Lower Extremity Long Arc Quad: Strengthening;Both;10 reps;Seated Hip Flexion/Marching: Strengthening;Both;10 reps;Seated    General Comments        Pertinent Vitals/Pain Pain Assessment: No/denies pain    Home Living                      Prior Function  PT Goals (current goals can now be found in the care plan section) Acute Rehab PT Goals Patient Stated Goal: To get stronger PT Goal Formulation: With patient Time For Goal Achievement: 07/03/16 Potential to Achieve Goals: Good Progress towards PT goals: Progressing toward goals    Frequency    Min 3X/week      PT Plan Current plan remains appropriate    Co-evaluation             End of Session Equipment Utilized During Treatment: Gait belt;Oxygen Activity Tolerance: Patient limited by fatigue;Patient limited by pain Patient left: in chair;with call bell/phone within reach Nurse Communication: Mobility status PT Visit Diagnosis: Muscle weakness (generalized) (M62.81);Other  abnormalities of gait and mobility (R26.89)     Time: 0930-1000 PT Time Calculation (min) (ACUTE ONLY): 30 min  Charges:  $Gait Training: 8-22 mins $Therapeutic Exercise: 8-22 mins                    G Codes:       Beth Velvet [place], PT, DPT X: [phone]

## 2016-06-27 NOTE — Care Management (Signed)
Patient has elected to return home and declines SNF. She is active with [facility] for RN/PT/OT services. Resumption orders placed and Tim of [facility] notified.

## 2016-06-27 NOTE — Progress Notes (Addendum)
LCSW following for disposition:  New SNF:    Patient has been accepted to [facility] for admission. Patient today is now refusing SNF placement reporting she is wanting to go home and has caregivers along with family to provide assistance. Eldest daughter in room who is agreeable, and asks LCSW to speak with her other sister:  Nyra Capes [phone] regarding plan.  Call returned from [facility] Reports she came and saw patient this morning, seems to be a lot better and agreeable for patient to return home.  CM aware of plan.  HH to be reinstated.    Unable to reach [place], but message was left. Patient is alert and oriented to make her own decisions per nursing.  Patient must be agreeable to SNF as she must participate in therapy for insurance to authorize SNF.    Will discuss with other daughter once phone call returned. Bed remains available at [facility] if patient decides she wants to go. Per CM patient is active with caregivers and home health.    Plan:  MD to speak to patient, most likely patient to return home.   Lane Hacker, MSW Clinical Social Work: [employer] Coverage for :  [phone]

## 2016-06-27 NOTE — Progress Notes (Signed)
Pt's IV catheter removed and intact. Pt's IV site clean dry and intact. Discharge instructions including medications and follow up appointments were reviewed and discussed with patient. All questions were answered and no further questions at this time. Pt verbalized understanding of discharge instructions. Pt in stable condition and in no acute distress at time of discharge. Pt escorted by nurse tech. 

## 2016-06-27 NOTE — Progress Notes (Signed)
ANTICOAGULATION CONSULT NOTE -   Pharmacy Consult for Coumadin (home dose) Indication: mural thrombus  Allergies  Allergen Reactions  . Codeine Nausea And Vomiting   Patient Measurements: Height: 4\' 11"  (149.9 cm) Weight: 144 lb 13.5 oz (65.7 kg) IBW/kg (Calculated) : 43.2  Vital Signs: Temp: 98.3 F (36.8 C) (04/06 0600) Temp Source: Oral (04/06 0600) BP: 157/74 (04/06 0600) Pulse Rate: 64 (04/06 0600)  Labs:  Recent Labs  06/25/16 1900 06/26/16 0418 06/27/16 0451  HGB 11.0* 10.3* 10.2*  HCT 34.4* 32.1* 32.3*  PLT 169 167 178  LABPROT 26.4*  --  27.9*  INR 2.38  --  2.55  CREATININE 1.46* 1.11* 0.97  TROPONINI <0.03  --   --     Estimated Creatinine Clearance: 32.4 mL/min (by C-G formula based on SCr of 0.97 mg/dL).   Medical History: Past Medical History:  Diagnosis Date  . Arthritis    severe lower back pain  . CAD (coronary artery disease)    anterior MI in 2006 with Cypher DES to LAD  . Cardiomyopathy   . CHF (congestive heart failure) ([place])   . CKD (chronic kidney disease) stage 3, GFR 30-59 ml/min    Stage III-stage IV  . HTN (hypertension)   . LV (left ventricular) mural thrombus    on coumadin   . MI, old   . Nephrolithiasis   . OA (osteoarthritis) of hip   . On home oxygen therapy    at night  . Osteoporosis   . Other specified forms of chronic ischemic heart disease    echo (1/10) with EF 25-30%, mildly dilated LV, periapical LV aneueysm, calcified LV thombus, moderate MR. Medtronic ICD set to VVI.   Marland Kitchen Pure hypercholesterolemia     Medications:  Prescriptions Prior to Admission  Medication Sig Dispense Refill Last Dose  . acetaminophen (TYLENOL) 325 MG tablet Take 325-650 mg by mouth every 6 (six) hours as needed for mild pain.    unknown  . atorvastatin (LIPITOR) 40 MG tablet Take 1 Tablet by mouth every evening 90 tablet 3 06/24/2016 at Unknown time  . carvedilol (COREG) 25 MG tablet Take 1 Tablet by mouth 2 times a day 180 tablet 3  06/25/2016 at 0900  . cholecalciferol (VITAMIN D) 1000 UNITS tablet Take 1,000 Units by mouth daily.     06/25/2016 at Unknown time  . folic acid (FOLVITE) 1 MG tablet Take 1 mg by mouth daily.   06/25/2016 at Unknown time  . furosemide (LASIX) 40 MG tablet Take 1 tablet (40 mg total) by mouth 2 (two) times daily. 60 tablet 3 06/25/2016 at Unknown time  . gabapentin (NEURONTIN) 100 MG capsule 1 capsule 3 (three) times daily.   06/25/2016 at Unknown time  . isosorbide mononitrate (IMDUR) 60 MG 24 hr tablet Take 1 tablet (60 mg total) by mouth daily. 30 tablet 6 06/25/2016 at Unknown time  . losartan (COZAAR) 50 MG tablet Take 1 Tablet by mouth once daily 30 tablet 3 06/25/2016 at Unknown time  . Multiple Vitamins-Minerals (ICAPS AREDS FORMULA PO) Take 2 capsules by mouth daily.    06/25/2016 at Unknown time  . nitroGLYCERIN (NITROSTAT) 0.4 MG SL tablet Place 1 tablet (0.4 mg total) under the tongue every 5 (five) minutes as needed for chest pain. 25 tablet 3 unknown  . pantoprazole (PROTONIX) 40 MG tablet Take 1 Tablet by mouth once daily 90 tablet 3 06/25/2016 at Unknown time  . Phenazopyridine HCl (AZO-STANDARD PO) Take 2 tablets by mouth  daily as needed.   06/25/2016 at Unknown time  . polyethylene glycol (MIRALAX / GLYCOLAX) packet Take 17 g by mouth daily. 14 each 0 06/25/2016 at Unknown time  . saxagliptin HCl (ONGLYZA) 2.5 MG TABS tablet Take 2.5 mg by mouth daily.   06/25/2016 at Unknown time  . sertraline (ZOLOFT) 50 MG tablet Take 50 mg by mouth daily.    06/25/2016 at Unknown time  . spironolactone (ALDACTONE) 25 MG tablet Take 1 Tablet by mouth once daily 90 tablet 3 06/25/2016 at Unknown time  . traMADol (ULTRAM) 50 MG tablet Take 1 tablet (50 mg total) by mouth every 6 (six) hours as needed for moderate pain. 10 tablet 0 06/25/2016 at Unknown time  . warfarin (COUMADIN) 2.5 MG tablet TAKE AS DIRECTED BY ANTICOAGULATION CLINIC (Patient taking differently: Take 2.5mg  on Monday, Wednesday, Saturday.  Take 1.25 mg on  Tuesday, Thursday, Friday, Sunday.) 30 tablet 3 06/25/2016 at 0900   Assessment: 83yo female on chronic Coumadin for h/o mural thrombus.  INR therapeutic on admission and therapeutic today.  Anticoagulation Dose Instructions as of 06/24/2016    Total Sun Mon Tue Wed Thu Fri Sat  New Dose 13.75 mg 2.5 mg 1.25 mg 2.5 mg 1.25 mg 2.5 mg 1.25 mg 2.5 mg    (2.5 mg x 1) (2.5 mg x 0.5) (2.5 mg x 1) (2.5 mg x 0.5) (2.5 mg x 1) (2.5 mg x 0.5) (2.5 mg x 1)   Goal of Therapy:  INR 2-3 Monitor platelets by anticoagulation protocol: Yes   Plan:  Coumadin 1.25mg  today x 1 (home dose) INR daily  Isac Sarna, BS Pharm D, [place] Clinical Pharmacist Pager [phone] 06/27/2016,11:05 AM

## 2016-06-28 DIAGNOSIS — I2510 Atherosclerotic heart disease of native coronary artery without angina pectoris: Secondary | ICD-10-CM | POA: Diagnosis not present

## 2016-06-28 DIAGNOSIS — J9611 Chronic respiratory failure with hypoxia: Secondary | ICD-10-CM | POA: Diagnosis not present

## 2016-06-28 DIAGNOSIS — I252 Old myocardial infarction: Secondary | ICD-10-CM | POA: Diagnosis not present

## 2016-06-28 DIAGNOSIS — I2584 Coronary atherosclerosis due to calcified coronary lesion: Secondary | ICD-10-CM | POA: Diagnosis not present

## 2016-06-28 DIAGNOSIS — I255 Ischemic cardiomyopathy: Secondary | ICD-10-CM | POA: Diagnosis not present

## 2016-06-28 DIAGNOSIS — Z87440 Personal history of urinary (tract) infections: Secondary | ICD-10-CM | POA: Diagnosis not present

## 2016-06-28 DIAGNOSIS — E1122 Type 2 diabetes mellitus with diabetic chronic kidney disease: Secondary | ICD-10-CM | POA: Diagnosis not present

## 2016-06-28 DIAGNOSIS — I130 Hypertensive heart and chronic kidney disease with heart failure and stage 1 through stage 4 chronic kidney disease, or unspecified chronic kidney disease: Secondary | ICD-10-CM | POA: Diagnosis not present

## 2016-06-28 DIAGNOSIS — E1140 Type 2 diabetes mellitus with diabetic neuropathy, unspecified: Secondary | ICD-10-CM | POA: Diagnosis not present

## 2016-06-28 DIAGNOSIS — E785 Hyperlipidemia, unspecified: Secondary | ICD-10-CM | POA: Diagnosis not present

## 2016-06-28 DIAGNOSIS — N183 Chronic kidney disease, stage 3 (moderate): Secondary | ICD-10-CM | POA: Diagnosis not present

## 2016-06-28 DIAGNOSIS — I5042 Chronic combined systolic (congestive) and diastolic (congestive) heart failure: Secondary | ICD-10-CM | POA: Diagnosis not present

## 2016-06-29 LAB — URINE CULTURE: Culture: >=100000 — AB

## 2016-07-01 DIAGNOSIS — J9611 Chronic respiratory failure with hypoxia: Secondary | ICD-10-CM | POA: Diagnosis not present

## 2016-07-01 DIAGNOSIS — I130 Hypertensive heart and chronic kidney disease with heart failure and stage 1 through stage 4 chronic kidney disease, or unspecified chronic kidney disease: Secondary | ICD-10-CM | POA: Diagnosis not present

## 2016-07-01 DIAGNOSIS — E1122 Type 2 diabetes mellitus with diabetic chronic kidney disease: Secondary | ICD-10-CM | POA: Diagnosis not present

## 2016-07-01 DIAGNOSIS — I255 Ischemic cardiomyopathy: Secondary | ICD-10-CM | POA: Diagnosis not present

## 2016-07-01 DIAGNOSIS — I2584 Coronary atherosclerosis due to calcified coronary lesion: Secondary | ICD-10-CM | POA: Diagnosis not present

## 2016-07-01 DIAGNOSIS — I5042 Chronic combined systolic (congestive) and diastolic (congestive) heart failure: Secondary | ICD-10-CM | POA: Diagnosis not present

## 2016-07-01 DIAGNOSIS — E785 Hyperlipidemia, unspecified: Secondary | ICD-10-CM | POA: Diagnosis not present

## 2016-07-01 DIAGNOSIS — I2510 Atherosclerotic heart disease of native coronary artery without angina pectoris: Secondary | ICD-10-CM | POA: Diagnosis not present

## 2016-07-01 DIAGNOSIS — E1140 Type 2 diabetes mellitus with diabetic neuropathy, unspecified: Secondary | ICD-10-CM | POA: Diagnosis not present

## 2016-07-01 DIAGNOSIS — N183 Chronic kidney disease, stage 3 (moderate): Secondary | ICD-10-CM | POA: Diagnosis not present

## 2016-07-01 DIAGNOSIS — Z87440 Personal history of urinary (tract) infections: Secondary | ICD-10-CM | POA: Diagnosis not present

## 2016-07-01 DIAGNOSIS — I252 Old myocardial infarction: Secondary | ICD-10-CM | POA: Diagnosis not present

## 2016-07-02 ENCOUNTER — Ambulatory Visit (INDEPENDENT_AMBULATORY_CARE_PROVIDER_SITE_OTHER): Payer: Medicare Other | Admitting: *Deleted

## 2016-07-02 DIAGNOSIS — I236 Thrombosis of atrium, auricular appendage, and ventricle as current complications following acute myocardial infarction: Secondary | ICD-10-CM | POA: Diagnosis not present

## 2016-07-02 DIAGNOSIS — Z5181 Encounter for therapeutic drug level monitoring: Secondary | ICD-10-CM | POA: Diagnosis not present

## 2016-07-02 DIAGNOSIS — I2129 ST elevation (STEMI) myocardial infarction involving other sites: Secondary | ICD-10-CM | POA: Diagnosis not present

## 2016-07-02 LAB — POCT INR: INR: 3.3

## 2016-07-03 DIAGNOSIS — E1140 Type 2 diabetes mellitus with diabetic neuropathy, unspecified: Secondary | ICD-10-CM | POA: Diagnosis not present

## 2016-07-03 DIAGNOSIS — I255 Ischemic cardiomyopathy: Secondary | ICD-10-CM | POA: Diagnosis not present

## 2016-07-03 DIAGNOSIS — E785 Hyperlipidemia, unspecified: Secondary | ICD-10-CM | POA: Diagnosis not present

## 2016-07-03 DIAGNOSIS — E1122 Type 2 diabetes mellitus with diabetic chronic kidney disease: Secondary | ICD-10-CM | POA: Diagnosis not present

## 2016-07-03 DIAGNOSIS — Z87440 Personal history of urinary (tract) infections: Secondary | ICD-10-CM | POA: Diagnosis not present

## 2016-07-03 DIAGNOSIS — I2584 Coronary atherosclerosis due to calcified coronary lesion: Secondary | ICD-10-CM | POA: Diagnosis not present

## 2016-07-03 DIAGNOSIS — I5042 Chronic combined systolic (congestive) and diastolic (congestive) heart failure: Secondary | ICD-10-CM | POA: Diagnosis not present

## 2016-07-03 DIAGNOSIS — I252 Old myocardial infarction: Secondary | ICD-10-CM | POA: Diagnosis not present

## 2016-07-03 DIAGNOSIS — I2510 Atherosclerotic heart disease of native coronary artery without angina pectoris: Secondary | ICD-10-CM | POA: Diagnosis not present

## 2016-07-03 DIAGNOSIS — I130 Hypertensive heart and chronic kidney disease with heart failure and stage 1 through stage 4 chronic kidney disease, or unspecified chronic kidney disease: Secondary | ICD-10-CM | POA: Diagnosis not present

## 2016-07-03 DIAGNOSIS — N183 Chronic kidney disease, stage 3 (moderate): Secondary | ICD-10-CM | POA: Diagnosis not present

## 2016-07-03 DIAGNOSIS — J9611 Chronic respiratory failure with hypoxia: Secondary | ICD-10-CM | POA: Diagnosis not present

## 2016-07-05 ENCOUNTER — Other Ambulatory Visit: Payer: Self-pay | Admitting: Internal Medicine

## 2016-07-05 ENCOUNTER — Telehealth: Payer: Self-pay | Admitting: Nurse Practitioner

## 2016-07-05 NOTE — Telephone Encounter (Signed)
   HHRN called b/c pts wt up 6 lbs over past week.  Prior labs and meds reviewed.  I rec that she increase lasix to 80 am and 40 pm for next 3 days and then resume 40 bid.  She has f/u with PCP on Monday @ which point labs can be rechecked.  Caller verbalized understanding and was grateful for the call back.  Murray Hodgkins, NP 07/05/2016, 11:34 AM

## 2016-07-07 ENCOUNTER — Emergency Department (HOSPITAL_COMMUNITY): Payer: Medicare Other

## 2016-07-07 ENCOUNTER — Encounter (HOSPITAL_COMMUNITY): Payer: Self-pay | Admitting: Emergency Medicine

## 2016-07-07 ENCOUNTER — Emergency Department (HOSPITAL_COMMUNITY)
Admission: EM | Admit: 2016-07-07 | Discharge: 2016-07-07 | Disposition: A | Discharge status: Home / Self Care | Payer: Medicare Other | Attending: Emergency Medicine | Admitting: Emergency Medicine

## 2016-07-07 DIAGNOSIS — E1122 Type 2 diabetes mellitus with diabetic chronic kidney disease: Secondary | ICD-10-CM | POA: Diagnosis not present

## 2016-07-07 DIAGNOSIS — W1830XA Fall on same level, unspecified, initial encounter: Secondary | ICD-10-CM | POA: Insufficient documentation

## 2016-07-07 DIAGNOSIS — I5042 Chronic combined systolic (congestive) and diastolic (congestive) heart failure: Secondary | ICD-10-CM | POA: Insufficient documentation

## 2016-07-07 DIAGNOSIS — Z79899 Other long term (current) drug therapy: Secondary | ICD-10-CM | POA: Diagnosis not present

## 2016-07-07 DIAGNOSIS — R9431 Abnormal electrocardiogram [ECG] [EKG]: Secondary | ICD-10-CM | POA: Diagnosis not present

## 2016-07-07 DIAGNOSIS — I255 Ischemic cardiomyopathy: Secondary | ICD-10-CM | POA: Diagnosis not present

## 2016-07-07 DIAGNOSIS — Z7901 Long term (current) use of anticoagulants: Secondary | ICD-10-CM | POA: Insufficient documentation

## 2016-07-07 DIAGNOSIS — S79921A Unspecified injury of right thigh, initial encounter: Secondary | ICD-10-CM | POA: Diagnosis present

## 2016-07-07 DIAGNOSIS — R2241 Localized swelling, mass and lump, right lower limb: Secondary | ICD-10-CM | POA: Diagnosis not present

## 2016-07-07 DIAGNOSIS — Y939 Activity, unspecified: Secondary | ICD-10-CM | POA: Insufficient documentation

## 2016-07-07 DIAGNOSIS — I2584 Coronary atherosclerosis due to calcified coronary lesion: Secondary | ICD-10-CM | POA: Diagnosis not present

## 2016-07-07 DIAGNOSIS — N183 Chronic kidney disease, stage 3 (moderate): Secondary | ICD-10-CM | POA: Diagnosis not present

## 2016-07-07 DIAGNOSIS — E1140 Type 2 diabetes mellitus with diabetic neuropathy, unspecified: Secondary | ICD-10-CM | POA: Diagnosis not present

## 2016-07-07 DIAGNOSIS — I130 Hypertensive heart and chronic kidney disease with heart failure and stage 1 through stage 4 chronic kidney disease, or unspecified chronic kidney disease: Secondary | ICD-10-CM | POA: Diagnosis not present

## 2016-07-07 DIAGNOSIS — Z87440 Personal history of urinary (tract) infections: Secondary | ICD-10-CM | POA: Diagnosis not present

## 2016-07-07 DIAGNOSIS — N184 Chronic kidney disease, stage 4 (severe): Secondary | ICD-10-CM | POA: Insufficient documentation

## 2016-07-07 DIAGNOSIS — Y929 Unspecified place or not applicable: Secondary | ICD-10-CM | POA: Insufficient documentation

## 2016-07-07 DIAGNOSIS — Z955 Presence of coronary angioplasty implant and graft: Secondary | ICD-10-CM | POA: Diagnosis not present

## 2016-07-07 DIAGNOSIS — J9611 Chronic respiratory failure with hypoxia: Secondary | ICD-10-CM | POA: Diagnosis not present

## 2016-07-07 DIAGNOSIS — R404 Transient alteration of awareness: Secondary | ICD-10-CM | POA: Diagnosis not present

## 2016-07-07 DIAGNOSIS — E785 Hyperlipidemia, unspecified: Secondary | ICD-10-CM | POA: Diagnosis not present

## 2016-07-07 DIAGNOSIS — M25562 Pain in left knee: Secondary | ICD-10-CM | POA: Diagnosis not present

## 2016-07-07 DIAGNOSIS — I2510 Atherosclerotic heart disease of native coronary artery without angina pectoris: Secondary | ICD-10-CM | POA: Diagnosis not present

## 2016-07-07 DIAGNOSIS — R1111 Vomiting without nausea: Secondary | ICD-10-CM | POA: Diagnosis not present

## 2016-07-07 DIAGNOSIS — S8992XA Unspecified injury of left lower leg, initial encounter: Secondary | ICD-10-CM | POA: Diagnosis not present

## 2016-07-07 DIAGNOSIS — R531 Weakness: Secondary | ICD-10-CM

## 2016-07-07 DIAGNOSIS — Y999 Unspecified external cause status: Secondary | ICD-10-CM | POA: Diagnosis not present

## 2016-07-07 DIAGNOSIS — I252 Old myocardial infarction: Secondary | ICD-10-CM | POA: Diagnosis not present

## 2016-07-07 DIAGNOSIS — R0602 Shortness of breath: Secondary | ICD-10-CM | POA: Diagnosis not present

## 2016-07-07 DIAGNOSIS — S72434A Nondisplaced fracture of medial condyle of right femur, initial encounter for closed fracture: Secondary | ICD-10-CM | POA: Diagnosis not present

## 2016-07-07 LAB — URINALYSIS, ROUTINE W REFLEX MICROSCOPIC
Bilirubin Urine: NEGATIVE
Glucose, UA: NEGATIVE mg/dL
Hgb urine dipstick: NEGATIVE
KETONES UR: NEGATIVE mg/dL
LEUKOCYTES UA: NEGATIVE
NITRITE: NEGATIVE
PROTEIN: NEGATIVE mg/dL
Specific Gravity, Urine: 1.006 (ref 1.005–1.030)
pH: 6 (ref 5.0–8.0)

## 2016-07-07 LAB — COMPREHENSIVE METABOLIC PANEL
ALBUMIN: 3.5 g/dL (ref 3.5–5.0)
ALT: 10 U/L — ABNORMAL LOW (ref 14–54)
ANION GAP: 8 (ref 5–15)
AST: 20 U/L (ref 15–41)
Alkaline Phosphatase: 125 U/L (ref 38–126)
BILIRUBIN TOTAL: 0.6 mg/dL (ref 0.3–1.2)
BUN: 37 mg/dL — ABNORMAL HIGH (ref 6–20)
CO2: 37 mmol/L — ABNORMAL HIGH (ref 22–32)
Calcium: 8.9 mg/dL (ref 8.9–10.3)
Chloride: 99 mmol/L — ABNORMAL LOW (ref 101–111)
Creatinine, Ser: 1.15 mg/dL — ABNORMAL HIGH (ref 0.44–1.00)
GFR calc non Af Amer: 41 mL/min — ABNORMAL LOW (ref >60)
GFR, EST AFRICAN AMERICAN: 47 mL/min — AB (ref >60)
GLUCOSE: 120 mg/dL — AB (ref 65–99)
POTASSIUM: 4.2 mmol/L (ref 3.5–5.1)
Sodium: 144 mmol/L (ref 135–145)
TOTAL PROTEIN: 7.1 g/dL (ref 6.5–8.1)

## 2016-07-07 LAB — I-STAT CG4 LACTIC ACID, ED: LACTIC ACID, VENOUS: 1.14 mmol/L (ref 0.5–1.9)

## 2016-07-07 LAB — CBC WITH DIFFERENTIAL/PLATELET
BASOS PCT: 0 %
Basophils Absolute: 0 10*3/uL (ref 0.0–0.1)
EOS ABS: 0.1 10*3/uL (ref 0.0–0.7)
Eosinophils Relative: 1 %
HEMATOCRIT: 31.9 % — AB (ref 36.0–46.0)
Hemoglobin: 10 g/dL — ABNORMAL LOW (ref 12.0–15.0)
LYMPHS PCT: 21 %
Lymphs Abs: 1.6 10*3/uL (ref 0.7–4.0)
MCH: 28.7 pg (ref 26.0–34.0)
MCHC: 31.3 g/dL (ref 30.0–36.0)
MCV: 91.7 fL (ref 78.0–100.0)
MONO ABS: 0.5 10*3/uL (ref 0.1–1.0)
MONOS PCT: 7 %
NEUTROS ABS: 5.2 10*3/uL (ref 1.7–7.7)
Neutrophils Relative %: 71 %
Platelets: 177 10*3/uL (ref 150–400)
RBC: 3.48 MIL/uL — ABNORMAL LOW (ref 3.87–5.11)
RDW: 12.9 % (ref 11.5–15.5)
WBC: 7.4 10*3/uL (ref 4.0–10.5)

## 2016-07-07 LAB — PROTIME-INR
INR: 2.42
PROTHROMBIN TIME: 26.8 s — AB (ref 11.4–15.2)

## 2016-07-07 LAB — I-STAT TROPONIN, ED: TROPONIN I, POC: 0 ng/mL (ref 0.00–0.08)

## 2016-07-07 NOTE — ED Notes (Signed)
Patient transported to CT 

## 2016-07-07 NOTE — ED Notes (Signed)
Bed: BT24 Expected date:  Expected time:  Means of arrival:  Comments: EMS 83 yo, weakness

## 2016-07-07 NOTE — ED Notes (Signed)
Patient reports recent diagnosis and treatment of UTI. States she completed antibiotics but  "it doesn't feel like its gone."

## 2016-07-07 NOTE — ED Triage Notes (Addendum)
Per EMS, patient from home, c/o generalized weakness x2 days. Also reports 2 episodes of vomiting this morning. Denies nausea at this time. Denies abdominal pain, chest pain, and diarrhea. Ambulatory with EMS. A&Ox4.   BP: 145/71 HR: 65 O2: 95% on 2L CBG: 166

## 2016-07-07 NOTE — Discharge Instructions (Signed)
Keep leg elevated when at home to reduce swelling. Ice several times a day. Follow up orthopedics specialist this week or next week for further evaluation. No weight bearing on right leg. Follow up with family doctor regarding weakness.

## 2016-07-07 NOTE — ED Provider Notes (Signed)
[place] DEPT Provider Note   CSN: [id number] Arrival date & time: 07/07/16  1237     History   Chief Complaint Chief Complaint  Patient presents with  . Weakness    HPI [name] is a 83 y.o. female.  HPI [name] is a 83 y.o. female with multiple medical problems, presents to ED with complaint of generalized weakness, abdominal pain, right knee pain. Patient states that she fell approximately a week and a half ago, since then she is having pain in the right knee. Pain has progressively gotten worse and now she is unable to walk. She was seen in emergency department at that time and was diagnosed with UTI and was admitted. She was discharged home with antibiotics. States she finished all of the antibiotics except for 2 less doses of Macrobid because she developed lip swelling. She states since then she has had increased pain as well as generalized weakness and fatigue. Her home nurse has been helping her with daily activities as well as transfers from wheelchair to her bed or her chair. She is unable to bear any weight on that right knee. She has been taking tramadol for pain with no relief. She also continues to have urinary frequency and urgency, as well as some lower abdominal discomfort. Denies any fever or chills. No nausea or vomiting. No back pain. No other complaints.  Past Medical History:  Diagnosis Date  . Arthritis    severe lower back pain  . CAD (coronary artery disease)    anterior MI in 2006 with Cypher DES to LAD  . Cardiomyopathy   . CHF (congestive heart failure) ([place])   . CKD (chronic kidney disease) stage 3, GFR 30-59 ml/min    Stage III-stage IV  . HTN (hypertension)   . LV (left ventricular) mural thrombus    on coumadin   . MI, old   . Nephrolithiasis   . OA (osteoarthritis) of hip   . On home oxygen therapy    at night  . Osteoporosis   . Other specified forms of chronic ischemic heart disease    echo (1/10) with EF 25-30%, mildly  dilated LV, periapical LV aneueysm, calcified LV thombus, moderate MR. Medtronic ICD set to VVI.   Marland Kitchen Pure hypercholesterolemia     Patient Active Problem List   Diagnosis Date Noted  . Chronic combined systolic and diastolic CHF (congestive heart failure) ([place]) 06/26/2016  . Chronic respiratory failure with hypoxia ([place]) 06/26/2016  . UTI (urinary tract infection) 06/25/2016  . Weakness generalized 02/06/2016  . CAP (community acquired pneumonia) 02/06/2016  . CKD (chronic kidney disease) stage 3, GFR 30-59 ml/min 04/26/2015  . Urinary tract infection 04/25/2015  . Cardiomyopathy, ischemic 04/25/2015  . Fecal impaction ([place]) 04/25/2015  . Degenerative joint disease 04/25/2015  . Chest pain, precordial 08/28/2014  . Encounter for therapeutic drug monitoring 05/02/2013  . CKD (chronic kidney disease) 10/22/2011  . Angina at rest [facility]) 10/08/2011  . Lower urinary tract infectious disease 10/08/2011  . Ventricular tachycardia ([place]) 06/10/2011  . Systolic CHF, chronic ([place]) 12/11/2010  . LV (left ventricular) mural thrombus 12/11/2010  . Long term current use of anticoagulant therapy 06/25/2010  . ICD-Medtronic 05/01/2009  . HYPERCHOLESTEROLEMIA 06/19/2008  . Hyperlipidemia 06/19/2008  . HTN (hypertension) 06/19/2008  . Coronary artery disease due to calcified coronary lesion 06/19/2008  . Aneurysm of heart wall 06/19/2008  . UNSPECIFIED SYSTOLIC HEART FAILURE 25/63/8937  . ARTHRITIS 06/19/2008  . SHORTNESS OF BREATH 06/19/2008  Past Surgical History:  Procedure Laterality Date  . AICD implantation     ICD-Medtronic. Remote-yes   . CARDIAC CATHETERIZATION    . CORONARY ANGIOPLASTY    . CORONARY ANGIOPLASTY WITH STENT PLACEMENT    . VESICOVAGINAL FISTULA CLOSURE W/ TAH  1978    OB History    No data available       Home Medications    Prior to Admission medications   Medication Sig Start Date End Date Taking? Authorizing Provider  acetaminophen (TYLENOL) 325 MG  tablet Take 325-650 mg by mouth every 6 (six) hours as needed for mild pain.     Historical Provider, MD  atorvastatin (LIPITOR) 40 MG tablet Take 1 Tablet by mouth every evening 06/06/16   Jolaine Artist, MD  carvedilol (COREG) 25 MG tablet Take 1 Tablet by mouth 2 times a day 04/28/16   Larey Dresser, MD  cholecalciferol (VITAMIN D) 1000 UNITS tablet Take 1,000 Units by mouth daily.      Historical Provider, MD  ciprofloxacin (CIPRO) 250 MG tablet Take 1 tablet (250 mg total) by mouth 2 (two) times daily. 06/27/16   Kathie Dike, MD  folic acid (FOLVITE) 1 MG tablet Take 1 mg by mouth daily.    Historical Provider, MD  furosemide (LASIX) 40 MG tablet Take 1 tablet (40 mg total) by mouth 2 (two) times daily. 05/20/16   Larey Dresser, MD  gabapentin (NEURONTIN) 100 MG capsule 1 capsule 3 (three) times daily. 01/14/16   Historical Provider, MD  isosorbide mononitrate (IMDUR) 60 MG 24 hr tablet Take 1 tablet (60 mg total) by mouth daily. 05/15/16   Amy D Ninfa Meeker, NP  losartan (COZAAR) 50 MG tablet Take 1 Tablet by mouth once daily 01/07/16   Shirley Friar, PA-C  Multiple Vitamins-Minerals (ICAPS AREDS FORMULA PO) Take 2 capsules by mouth daily.     Historical Provider, MD  nitrofurantoin, macrocrystal-monohydrate, (MACROBID) 100 MG capsule Take 1 capsule (100 mg total) by mouth 2 (two) times daily. 06/27/16   Kathie Dike, MD  nitroGLYCERIN (NITROSTAT) 0.4 MG SL tablet Place 1 tablet (0.4 mg total) under the tongue every 5 (five) minutes as needed for chest pain. 02/12/15   Evans Lance, MD  pantoprazole (PROTONIX) 40 MG tablet Take 1 Tablet by mouth once daily 01/15/16   Larey Dresser, MD  Phenazopyridine HCl (AZO-STANDARD PO) Take 2 tablets by mouth daily as needed.    Historical Provider, MD  polyethylene glycol (MIRALAX / GLYCOLAX) packet Take 17 g by mouth daily. 04/28/15   Domenic Polite, MD  saxagliptin HCl (ONGLYZA) 2.5 MG TABS tablet Take 2.5 mg by mouth daily.    Historical  Provider, MD  sertraline (ZOLOFT) 50 MG tablet Take 50 mg by mouth daily.     Historical Provider, MD  spironolactone (ALDACTONE) 25 MG tablet Take 1 Tablet by mouth once daily 04/28/16   Larey Dresser, MD  traMADol (ULTRAM) 50 MG tablet Take 1 tablet (50 mg total) by mouth every 6 (six) hours as needed for moderate pain. 09/12/15   Julianne Rice, MD  warfarin (COUMADIN) 2.5 MG tablet take ONE tablet daily OR as instructed by coumadin clinic 07/07/16   Evans Lance, MD    Family History Family History  Problem Relation Age of Onset  . Stroke Father   . Breast cancer Mother   . Stroke Brother   . Diabetes Brother     Social History Social History  Substance Use Topics  .  Smoking status: Never Smoker  . Smokeless tobacco: Never Used  . Alcohol use No     Allergies   Codeine   Review of Systems Review of Systems  Constitutional: Positive for fatigue. Negative for chills and fever.  Respiratory: Negative for cough, chest tightness and shortness of breath.   Cardiovascular: Negative for chest pain, palpitations and leg swelling.  Gastrointestinal: Positive for abdominal pain. Negative for diarrhea, nausea and vomiting.  Genitourinary: Positive for dysuria. Negative for difficulty urinating, flank pain and pelvic pain.  Musculoskeletal: Positive for arthralgias and joint swelling. Negative for myalgias, neck pain and neck stiffness.  Skin: Negative for rash.  Neurological: Positive for weakness. Negative for dizziness and headaches.  All other systems reviewed and are negative.    Physical Exam Updated Vital Signs BP (!) 146/83   Pulse 69   Temp 98.3 F (36.8 C) (Oral)   Resp 17   SpO2 99%   Physical Exam  Constitutional: She is oriented to person, place, and time. She appears well-developed and well-nourished. No distress.  HENT:  Head: Normocephalic.  Eyes: Conjunctivae and EOM are normal. Pupils are equal, round, and reactive to light.  Neck: Normal range of  motion. Neck supple.  Cardiovascular: Normal rate and regular rhythm.   Murmur heard. Pulmonary/Chest: Effort normal and breath sounds normal. No respiratory distress. She has no wheezes. She has no rales.  Abdominal: Soft. Bowel sounds are normal. She exhibits no distension. There is no tenderness. There is no rebound.  Musculoskeletal:  Swelling noted to the right knee, specifically over suprapatellar region. Patient has limited flexion to the knee but is able to flex her leg up to 45%. Full extension. Joint is warm, however that there is no erythema, there is no skin discoloration. No tenderness over patella tendon. Joint is stable. No peripheral pitting edema. DP pulses 2+ and equal bilaterally  Neurological: She is alert and oriented to person, place, and time. She displays normal reflexes. No cranial nerve deficit. Coordination normal.  Skin: Skin is warm and dry.  Psychiatric: She has a normal mood and affect. Her behavior is normal.  Nursing note and vitals reviewed.    ED Treatments / Results  Labs (all labs ordered are listed, but only abnormal results are displayed) Labs Reviewed  CBC WITH DIFFERENTIAL/PLATELET - Abnormal; Notable for the following:       Result Value   RBC 3.48 (*)    Hemoglobin 10.0 (*)    HCT 31.9 (*)    All other components within normal limits  URINALYSIS, ROUTINE W REFLEX MICROSCOPIC - Abnormal; Notable for the following:    APPearance HAZY (*)    All other components within normal limits  PROTIME-INR - Abnormal; Notable for the following:    Prothrombin Time 26.8 (*)    All other components within normal limits  COMPREHENSIVE METABOLIC PANEL  I-STAT CG4 LACTIC ACID, ED  Randolm Idol, ED    EKG  EKG Interpretation None       Radiology Dg Chest 2 View  Result Date: 07/07/2016 CLINICAL DATA:  Shortness of breath and weakness. History of hypertension, on home oxygen. EXAM: CHEST  2 VIEW COMPARISON:  Chest radiograph June 25, 2016  FINDINGS: Cardiac silhouette is mildly enlarged. Mediastinal silhouette is unremarkable for this low inspiratory examination with crowded vasculature markings. Mildly calcified aortic knob. Similarly elevated RIGHT hemidiaphragm with bibasilar strandy densities. No pleural effusion. Trachea projects midline and there is no pneumothorax. Included soft tissue planes and osseous structures are  non-suspicious. Osteopenia. Probable calcified breast implants. Mild broad lower thoracic levoscoliosis. LEFT cardiac defibrillator in situ. IMPRESSION: Mild cardiomegaly and bibasilar atelectasis/ scarring. Electronically Signed   By: Elon Alas M.D.   On: 07/07/2016 14:31   Ct Knee Right Wo Contrast  Result Date: 07/07/2016 CLINICAL DATA:  Left knee pain. Status post fall 06/22/2016 with continued pain. Initial encounter. EXAM: CT OF THE LEFT KNEE WITHOUT CONTRAST TECHNIQUE: Multidetector CT imaging of the left knee was performed according to the standard protocol. Multiplanar CT image reconstructions were also generated. COMPARISON:  Plain films left knee 06/25/2016. FINDINGS: Bones/Joint/Cartilage A tiny defect in the cortex of the medial epicondyle of the femur is identified as seen on images 28-30 of series 2. No other finding to suggest fracture is identified. Bones are osteopenic. Ligaments Suboptimally assessed by CT. Muscles and Tendons Intact. Soft tissues Atherosclerosis noted. Large joint effusion is seen. Chondrocalcinosis is also identified. IMPRESSION: Possible tiny nondisplaced and incomplete fracture medial epicondyle right femur. Recommend correlation for point tenderness. Large joint effusion. Osteopenia. Atherosclerosis. Electronically Signed   By: Inge Rise M.D.   On: 07/07/2016 15:48    Procedures Procedures (including critical care time)  Medications Ordered in ED Medications - No data to display   Initial Impression / Assessment and Plan / ED Course  I have reviewed the triage  vital signs and the nursing notes.  Pertinent labs & imaging results that were available during my care of the patient were reviewed by me and considered in my medical decision making (see chart for details).   patient in emergency department with right knee pain after the fall 2 week and a half ago, with persistent weakness and generalized malaise. Recent admission after this fall and diagnosed with UTI. Will recheck labs, recheck UA, get xray of chest. Pt does report some recent weight gain. Will get CT right knee.   4:24 PM CT shows large effusion and tiny non displaced and incomplete fracture of medial epicondyle. Given pt's medical problems and fracture, I doubt she would be a surgical candidate. Had a long discussion with family, as well as Dr. Zenia Resides has seen and and spoke with family as well. Pt has good support at home, she has home health nurse and PT, as well as Psychologist, sport and exercise. She refused admission to SNF on recent admission. She has no indication for admission today. Will dc home with concervative treatment for her leg fracture and joint effusion. She will follow up with orthopedics and PCP. Instructed to ice and elevate leg, no weight bearing. Family agreed.   Vitals:   07/07/16 1256 07/07/16 1453 07/07/16 1455  BP: (!) 146/83  (!) 134/58  Pulse: 69  63  Resp: 17  18  Temp: 98.3 F (36.8 C)    TempSrc: Oral    SpO2: 99%  100%  Weight:  65.9 kg      Final Clinical Impressions(s) / ED Diagnoses   Final diagnoses:  Closed nondisplaced fracture of medial condyle of right femur, initial encounter [facility])  Generalized weakness    New Prescriptions New Prescriptions   No medications on file     Jeannett Senior, PA-C 07/07/16 [place], MD 07/08/16 1642

## 2016-07-08 ENCOUNTER — Telehealth: Payer: Self-pay | Admitting: *Deleted

## 2016-07-08 DIAGNOSIS — I252 Old myocardial infarction: Secondary | ICD-10-CM | POA: Diagnosis not present

## 2016-07-08 DIAGNOSIS — I130 Hypertensive heart and chronic kidney disease with heart failure and stage 1 through stage 4 chronic kidney disease, or unspecified chronic kidney disease: Secondary | ICD-10-CM | POA: Diagnosis not present

## 2016-07-08 DIAGNOSIS — I255 Ischemic cardiomyopathy: Secondary | ICD-10-CM | POA: Diagnosis not present

## 2016-07-08 DIAGNOSIS — E1140 Type 2 diabetes mellitus with diabetic neuropathy, unspecified: Secondary | ICD-10-CM | POA: Diagnosis not present

## 2016-07-08 DIAGNOSIS — E1122 Type 2 diabetes mellitus with diabetic chronic kidney disease: Secondary | ICD-10-CM | POA: Diagnosis not present

## 2016-07-08 DIAGNOSIS — J9611 Chronic respiratory failure with hypoxia: Secondary | ICD-10-CM | POA: Diagnosis not present

## 2016-07-08 DIAGNOSIS — I2510 Atherosclerotic heart disease of native coronary artery without angina pectoris: Secondary | ICD-10-CM | POA: Diagnosis not present

## 2016-07-08 DIAGNOSIS — I5042 Chronic combined systolic (congestive) and diastolic (congestive) heart failure: Secondary | ICD-10-CM | POA: Diagnosis not present

## 2016-07-08 DIAGNOSIS — E785 Hyperlipidemia, unspecified: Secondary | ICD-10-CM | POA: Diagnosis not present

## 2016-07-08 DIAGNOSIS — I2584 Coronary atherosclerosis due to calcified coronary lesion: Secondary | ICD-10-CM | POA: Diagnosis not present

## 2016-07-08 DIAGNOSIS — N183 Chronic kidney disease, stage 3 (moderate): Secondary | ICD-10-CM | POA: Diagnosis not present

## 2016-07-08 DIAGNOSIS — Z87440 Personal history of urinary (tract) infections: Secondary | ICD-10-CM | POA: Diagnosis not present

## 2016-07-08 NOTE — Telephone Encounter (Signed)
Pt was in ED yesterday.  INR was 2.4 so Home Health  Nurse did not repeat INR today.  Told Jennifer Kindrick RN to have pt continue coumadin 2.5mg  daily except 1.25mg  on M,W,F and recheck next week.

## 2016-07-08 NOTE — Telephone Encounter (Signed)
Anderson Malta with [facility] [phone] called about patient going to ER on 07/07/2016.  She needs to speak with coumdin nurse about the PT INR results that was drawn on Monday at [facility].

## 2016-07-09 DIAGNOSIS — I252 Old myocardial infarction: Secondary | ICD-10-CM | POA: Diagnosis not present

## 2016-07-09 DIAGNOSIS — J9611 Chronic respiratory failure with hypoxia: Secondary | ICD-10-CM | POA: Diagnosis not present

## 2016-07-09 DIAGNOSIS — E1122 Type 2 diabetes mellitus with diabetic chronic kidney disease: Secondary | ICD-10-CM | POA: Diagnosis not present

## 2016-07-09 DIAGNOSIS — I130 Hypertensive heart and chronic kidney disease with heart failure and stage 1 through stage 4 chronic kidney disease, or unspecified chronic kidney disease: Secondary | ICD-10-CM | POA: Diagnosis not present

## 2016-07-09 DIAGNOSIS — I5042 Chronic combined systolic (congestive) and diastolic (congestive) heart failure: Secondary | ICD-10-CM | POA: Diagnosis not present

## 2016-07-09 DIAGNOSIS — N183 Chronic kidney disease, stage 3 (moderate): Secondary | ICD-10-CM | POA: Diagnosis not present

## 2016-07-09 DIAGNOSIS — I2584 Coronary atherosclerosis due to calcified coronary lesion: Secondary | ICD-10-CM | POA: Diagnosis not present

## 2016-07-09 DIAGNOSIS — I2510 Atherosclerotic heart disease of native coronary artery without angina pectoris: Secondary | ICD-10-CM | POA: Diagnosis not present

## 2016-07-09 DIAGNOSIS — E1140 Type 2 diabetes mellitus with diabetic neuropathy, unspecified: Secondary | ICD-10-CM | POA: Diagnosis not present

## 2016-07-09 DIAGNOSIS — E785 Hyperlipidemia, unspecified: Secondary | ICD-10-CM | POA: Diagnosis not present

## 2016-07-09 DIAGNOSIS — I255 Ischemic cardiomyopathy: Secondary | ICD-10-CM | POA: Diagnosis not present

## 2016-07-09 DIAGNOSIS — Z87440 Personal history of urinary (tract) infections: Secondary | ICD-10-CM | POA: Diagnosis not present

## 2016-07-10 DIAGNOSIS — I5042 Chronic combined systolic (congestive) and diastolic (congestive) heart failure: Secondary | ICD-10-CM | POA: Diagnosis not present

## 2016-07-10 DIAGNOSIS — E1122 Type 2 diabetes mellitus with diabetic chronic kidney disease: Secondary | ICD-10-CM | POA: Diagnosis not present

## 2016-07-10 DIAGNOSIS — E785 Hyperlipidemia, unspecified: Secondary | ICD-10-CM | POA: Diagnosis not present

## 2016-07-10 DIAGNOSIS — I130 Hypertensive heart and chronic kidney disease with heart failure and stage 1 through stage 4 chronic kidney disease, or unspecified chronic kidney disease: Secondary | ICD-10-CM | POA: Diagnosis not present

## 2016-07-10 DIAGNOSIS — Z87440 Personal history of urinary (tract) infections: Secondary | ICD-10-CM | POA: Diagnosis not present

## 2016-07-10 DIAGNOSIS — E1140 Type 2 diabetes mellitus with diabetic neuropathy, unspecified: Secondary | ICD-10-CM | POA: Diagnosis not present

## 2016-07-10 DIAGNOSIS — I252 Old myocardial infarction: Secondary | ICD-10-CM | POA: Diagnosis not present

## 2016-07-10 DIAGNOSIS — J9611 Chronic respiratory failure with hypoxia: Secondary | ICD-10-CM | POA: Diagnosis not present

## 2016-07-10 DIAGNOSIS — N183 Chronic kidney disease, stage 3 (moderate): Secondary | ICD-10-CM | POA: Diagnosis not present

## 2016-07-10 DIAGNOSIS — R0602 Shortness of breath: Secondary | ICD-10-CM | POA: Diagnosis not present

## 2016-07-10 DIAGNOSIS — I2510 Atherosclerotic heart disease of native coronary artery without angina pectoris: Secondary | ICD-10-CM | POA: Diagnosis not present

## 2016-07-10 DIAGNOSIS — I509 Heart failure, unspecified: Secondary | ICD-10-CM | POA: Diagnosis not present

## 2016-07-10 DIAGNOSIS — I255 Ischemic cardiomyopathy: Secondary | ICD-10-CM | POA: Diagnosis not present

## 2016-07-10 DIAGNOSIS — I2584 Coronary atherosclerosis due to calcified coronary lesion: Secondary | ICD-10-CM | POA: Diagnosis not present

## 2016-07-11 DIAGNOSIS — I2510 Atherosclerotic heart disease of native coronary artery without angina pectoris: Secondary | ICD-10-CM | POA: Diagnosis not present

## 2016-07-11 DIAGNOSIS — E1140 Type 2 diabetes mellitus with diabetic neuropathy, unspecified: Secondary | ICD-10-CM | POA: Diagnosis not present

## 2016-07-11 DIAGNOSIS — I252 Old myocardial infarction: Secondary | ICD-10-CM | POA: Diagnosis not present

## 2016-07-11 DIAGNOSIS — Z87440 Personal history of urinary (tract) infections: Secondary | ICD-10-CM | POA: Diagnosis not present

## 2016-07-11 DIAGNOSIS — I2584 Coronary atherosclerosis due to calcified coronary lesion: Secondary | ICD-10-CM | POA: Diagnosis not present

## 2016-07-11 DIAGNOSIS — I5042 Chronic combined systolic (congestive) and diastolic (congestive) heart failure: Secondary | ICD-10-CM | POA: Diagnosis not present

## 2016-07-11 DIAGNOSIS — E785 Hyperlipidemia, unspecified: Secondary | ICD-10-CM | POA: Diagnosis not present

## 2016-07-11 DIAGNOSIS — N183 Chronic kidney disease, stage 3 (moderate): Secondary | ICD-10-CM | POA: Diagnosis not present

## 2016-07-11 DIAGNOSIS — I255 Ischemic cardiomyopathy: Secondary | ICD-10-CM | POA: Diagnosis not present

## 2016-07-11 DIAGNOSIS — J9611 Chronic respiratory failure with hypoxia: Secondary | ICD-10-CM | POA: Diagnosis not present

## 2016-07-11 DIAGNOSIS — E1122 Type 2 diabetes mellitus with diabetic chronic kidney disease: Secondary | ICD-10-CM | POA: Diagnosis not present

## 2016-07-11 DIAGNOSIS — I130 Hypertensive heart and chronic kidney disease with heart failure and stage 1 through stage 4 chronic kidney disease, or unspecified chronic kidney disease: Secondary | ICD-10-CM | POA: Diagnosis not present

## 2016-07-15 ENCOUNTER — Ambulatory Visit (INDEPENDENT_AMBULATORY_CARE_PROVIDER_SITE_OTHER): Payer: Medicare Other | Admitting: *Deleted

## 2016-07-15 ENCOUNTER — Telehealth: Payer: Self-pay | Admitting: *Deleted

## 2016-07-15 DIAGNOSIS — I5042 Chronic combined systolic (congestive) and diastolic (congestive) heart failure: Secondary | ICD-10-CM | POA: Diagnosis not present

## 2016-07-15 DIAGNOSIS — J9611 Chronic respiratory failure with hypoxia: Secondary | ICD-10-CM | POA: Diagnosis not present

## 2016-07-15 DIAGNOSIS — I252 Old myocardial infarction: Secondary | ICD-10-CM | POA: Diagnosis not present

## 2016-07-15 DIAGNOSIS — E1122 Type 2 diabetes mellitus with diabetic chronic kidney disease: Secondary | ICD-10-CM | POA: Diagnosis not present

## 2016-07-15 DIAGNOSIS — I2510 Atherosclerotic heart disease of native coronary artery without angina pectoris: Secondary | ICD-10-CM | POA: Diagnosis not present

## 2016-07-15 DIAGNOSIS — E1140 Type 2 diabetes mellitus with diabetic neuropathy, unspecified: Secondary | ICD-10-CM | POA: Diagnosis not present

## 2016-07-15 DIAGNOSIS — N183 Chronic kidney disease, stage 3 (moderate): Secondary | ICD-10-CM | POA: Diagnosis not present

## 2016-07-15 DIAGNOSIS — I2584 Coronary atherosclerosis due to calcified coronary lesion: Secondary | ICD-10-CM | POA: Diagnosis not present

## 2016-07-15 DIAGNOSIS — Z5181 Encounter for therapeutic drug level monitoring: Secondary | ICD-10-CM

## 2016-07-15 DIAGNOSIS — E785 Hyperlipidemia, unspecified: Secondary | ICD-10-CM | POA: Diagnosis not present

## 2016-07-15 DIAGNOSIS — I255 Ischemic cardiomyopathy: Secondary | ICD-10-CM | POA: Diagnosis not present

## 2016-07-15 DIAGNOSIS — Z87440 Personal history of urinary (tract) infections: Secondary | ICD-10-CM | POA: Diagnosis not present

## 2016-07-15 DIAGNOSIS — I130 Hypertensive heart and chronic kidney disease with heart failure and stage 1 through stage 4 chronic kidney disease, or unspecified chronic kidney disease: Secondary | ICD-10-CM | POA: Diagnosis not present

## 2016-07-15 LAB — POCT INR: INR: 3.4

## 2016-07-15 NOTE — Telephone Encounter (Signed)
Done.  See coumadin note. 

## 2016-07-15 NOTE — Telephone Encounter (Signed)
inr 3.4 Pt 41.3

## 2016-07-16 DIAGNOSIS — I252 Old myocardial infarction: Secondary | ICD-10-CM | POA: Diagnosis not present

## 2016-07-16 DIAGNOSIS — E1122 Type 2 diabetes mellitus with diabetic chronic kidney disease: Secondary | ICD-10-CM | POA: Diagnosis not present

## 2016-07-16 DIAGNOSIS — I5042 Chronic combined systolic (congestive) and diastolic (congestive) heart failure: Secondary | ICD-10-CM | POA: Diagnosis not present

## 2016-07-16 DIAGNOSIS — Z87440 Personal history of urinary (tract) infections: Secondary | ICD-10-CM | POA: Diagnosis not present

## 2016-07-16 DIAGNOSIS — I130 Hypertensive heart and chronic kidney disease with heart failure and stage 1 through stage 4 chronic kidney disease, or unspecified chronic kidney disease: Secondary | ICD-10-CM | POA: Diagnosis not present

## 2016-07-16 DIAGNOSIS — I2510 Atherosclerotic heart disease of native coronary artery without angina pectoris: Secondary | ICD-10-CM | POA: Diagnosis not present

## 2016-07-16 DIAGNOSIS — I2584 Coronary atherosclerosis due to calcified coronary lesion: Secondary | ICD-10-CM | POA: Diagnosis not present

## 2016-07-16 DIAGNOSIS — E785 Hyperlipidemia, unspecified: Secondary | ICD-10-CM | POA: Diagnosis not present

## 2016-07-16 DIAGNOSIS — J9611 Chronic respiratory failure with hypoxia: Secondary | ICD-10-CM | POA: Diagnosis not present

## 2016-07-16 DIAGNOSIS — N183 Chronic kidney disease, stage 3 (moderate): Secondary | ICD-10-CM | POA: Diagnosis not present

## 2016-07-16 DIAGNOSIS — I255 Ischemic cardiomyopathy: Secondary | ICD-10-CM | POA: Diagnosis not present

## 2016-07-16 DIAGNOSIS — E1140 Type 2 diabetes mellitus with diabetic neuropathy, unspecified: Secondary | ICD-10-CM | POA: Diagnosis not present

## 2016-07-17 ENCOUNTER — Encounter (INDEPENDENT_AMBULATORY_CARE_PROVIDER_SITE_OTHER): Payer: Self-pay | Admitting: Physician Assistant

## 2016-07-17 ENCOUNTER — Ambulatory Visit (INDEPENDENT_AMBULATORY_CARE_PROVIDER_SITE_OTHER): Payer: Medicare Other | Admitting: Physician Assistant

## 2016-07-17 DIAGNOSIS — M25561 Pain in right knee: Secondary | ICD-10-CM

## 2016-07-17 DIAGNOSIS — E1122 Type 2 diabetes mellitus with diabetic chronic kidney disease: Secondary | ICD-10-CM | POA: Diagnosis not present

## 2016-07-17 DIAGNOSIS — J9611 Chronic respiratory failure with hypoxia: Secondary | ICD-10-CM | POA: Diagnosis not present

## 2016-07-17 DIAGNOSIS — I255 Ischemic cardiomyopathy: Secondary | ICD-10-CM | POA: Diagnosis not present

## 2016-07-17 DIAGNOSIS — I5042 Chronic combined systolic (congestive) and diastolic (congestive) heart failure: Secondary | ICD-10-CM | POA: Diagnosis not present

## 2016-07-17 DIAGNOSIS — I2510 Atherosclerotic heart disease of native coronary artery without angina pectoris: Secondary | ICD-10-CM | POA: Diagnosis not present

## 2016-07-17 DIAGNOSIS — E1140 Type 2 diabetes mellitus with diabetic neuropathy, unspecified: Secondary | ICD-10-CM | POA: Diagnosis not present

## 2016-07-17 DIAGNOSIS — Z87440 Personal history of urinary (tract) infections: Secondary | ICD-10-CM | POA: Diagnosis not present

## 2016-07-17 DIAGNOSIS — I252 Old myocardial infarction: Secondary | ICD-10-CM | POA: Diagnosis not present

## 2016-07-17 DIAGNOSIS — N183 Chronic kidney disease, stage 3 (moderate): Secondary | ICD-10-CM | POA: Diagnosis not present

## 2016-07-17 DIAGNOSIS — I130 Hypertensive heart and chronic kidney disease with heart failure and stage 1 through stage 4 chronic kidney disease, or unspecified chronic kidney disease: Secondary | ICD-10-CM | POA: Diagnosis not present

## 2016-07-17 DIAGNOSIS — I2584 Coronary atherosclerosis due to calcified coronary lesion: Secondary | ICD-10-CM | POA: Diagnosis not present

## 2016-07-17 DIAGNOSIS — E785 Hyperlipidemia, unspecified: Secondary | ICD-10-CM | POA: Diagnosis not present

## 2016-07-17 NOTE — Progress Notes (Signed)
Office Visit Note   Patient: [name]           Date of Birth: 1925-05-17           MRN: [id number] Visit Date: 07/17/2016              Requested by: [name], MD [address] [place], [place] [address] PCP: [name], MD   Assessment & Plan: Visit Diagnoses:  1. Acute pain of right knee     Plan: We will have her work on range of motion strengthening of the right knee. Also have physical therapy work with her on gait balance. Prescriptions given for therapy to work with her she has home health working with her and OT working with her at least twice a week. Also she is given a prescription for a new walker.  Follow-Up Instructions: Return in about 4 weeks (around 08/14/2016).   Orders:  No orders of the defined types were placed in this encounter.  No orders of the defined types were placed in this encounter.     Procedures: No procedures performed   Clinical Data: No additional findings.   Subjective: Chief Complaint  Patient presents with  . Right Knee - Pain, Injury    HPI [name] comes in today for follow-up of a right knee possible femur fracture. She went to [place] regular films today showed no acute fracture. She then had a CT scan of her right knee area that was suspicious for a incomplete fracture or medial epicondyle right femur. She reports that she had a fall there April 1 due to the fact that she states she has "poor balance". She states overall that the knee is improving as far as pain. In that the swelling is going down. She has been nonweightbearing. She transfers to a wheelchair.  Review of Systems  Positive for gait balance disturbance positive for right knee pain and swelling. She denies any chest pain shortness breath fevers chills otherwise please see history of present illness Objective: Vital Signs: There were no vitals taken for this visit.  Physical Exam  Constitutional: She is oriented to person,  place, and time. She appears well-developed and well-nourished. No distress.  Pulmonary/Chest: Effort normal.  Neurological: She is alert and oriented to person, place, and time.  Psychiatric: She has a normal mood and affect.    Ortho Exam Right knee full extension flexion and 90 no instability with valgus varus stressing. Slight effusion compared to the left knee. Right calf supple nontender. Tenderness along the medial femoral condyle with palpation. Specialty Comments:  No specialty comments available.  Imaging: No results found.   PMFS History: Patient Active Problem List   Diagnosis Date Noted  . Chronic combined systolic and diastolic CHF (congestive heart failure) ([place]) 06/26/2016  . Chronic respiratory failure with hypoxia ([place]) 06/26/2016  . UTI (urinary tract infection) 06/25/2016  . Weakness generalized 02/06/2016  . CAP (community acquired pneumonia) 02/06/2016  . CKD (chronic kidney disease) stage 3, GFR 30-59 ml/min 04/26/2015  . Urinary tract infection 04/25/2015  . Cardiomyopathy, ischemic 04/25/2015  . Fecal impaction ([place]) 04/25/2015  . Degenerative joint disease 04/25/2015  . Chest pain, precordial 08/28/2014  . Encounter for therapeutic drug monitoring 05/02/2013  . CKD (chronic kidney disease) 10/22/2011  . Angina at rest [facility]) 10/08/2011  . Lower urinary tract infectious disease 10/08/2011  . Ventricular tachycardia ([place]) 06/10/2011  . Systolic CHF, chronic ([place]) 12/11/2010  . LV (  left ventricular) mural thrombus 12/11/2010  . Long term current use of anticoagulant therapy 06/25/2010  . ICD-Medtronic 05/01/2009  . HYPERCHOLESTEROLEMIA 06/19/2008  . Hyperlipidemia 06/19/2008  . HTN (hypertension) 06/19/2008  . Coronary artery disease due to calcified coronary lesion 06/19/2008  . Aneurysm of heart wall 06/19/2008  . UNSPECIFIED SYSTOLIC HEART FAILURE 17/71/1657  . ARTHRITIS 06/19/2008  . SHORTNESS OF BREATH 06/19/2008   Past Medical History:    Diagnosis Date  . Arthritis    severe lower back pain  . CAD (coronary artery disease)    anterior MI in 2006 with Cypher DES to LAD  . Cardiomyopathy   . CHF (congestive heart failure) ([place])   . CKD (chronic kidney disease) stage 3, GFR 30-59 ml/min    Stage III-stage IV  . HTN (hypertension)   . LV (left ventricular) mural thrombus    on coumadin   . MI, old   . Nephrolithiasis   . OA (osteoarthritis) of hip   . On home oxygen therapy    at night  . Osteoporosis   . Other specified forms of chronic ischemic heart disease    echo (1/10) with EF 25-30%, mildly dilated LV, periapical LV aneueysm, calcified LV thombus, moderate MR. Medtronic ICD set to VVI.   Marland Kitchen Pure hypercholesterolemia     Family History  Problem Relation Age of Onset  . Stroke Father   . Breast cancer Mother   . Stroke Brother   . Diabetes Brother     Past Surgical History:  Procedure Laterality Date  . AICD implantation     ICD-Medtronic. Remote-yes   . CARDIAC CATHETERIZATION    . CORONARY ANGIOPLASTY    . CORONARY ANGIOPLASTY WITH STENT PLACEMENT    . VESICOVAGINAL FISTULA CLOSURE W/ TAH  1978   Social History   Occupational History  .  Retired    retired   Social History Main Topics  . Smoking status: Never Smoker  . Smokeless tobacco: Never Used  . Alcohol use No  . Drug use: No  . Sexual activity: No

## 2016-07-17 NOTE — Addendum Note (Signed)
Addended by: Erskine Emery on: 07/17/2016 06:44 PM   Modules accepted: Level of Service

## 2016-07-18 DIAGNOSIS — I130 Hypertensive heart and chronic kidney disease with heart failure and stage 1 through stage 4 chronic kidney disease, or unspecified chronic kidney disease: Secondary | ICD-10-CM | POA: Diagnosis not present

## 2016-07-18 DIAGNOSIS — E1140 Type 2 diabetes mellitus with diabetic neuropathy, unspecified: Secondary | ICD-10-CM | POA: Diagnosis not present

## 2016-07-18 DIAGNOSIS — I252 Old myocardial infarction: Secondary | ICD-10-CM | POA: Diagnosis not present

## 2016-07-18 DIAGNOSIS — I5042 Chronic combined systolic (congestive) and diastolic (congestive) heart failure: Secondary | ICD-10-CM | POA: Diagnosis not present

## 2016-07-18 DIAGNOSIS — J9611 Chronic respiratory failure with hypoxia: Secondary | ICD-10-CM | POA: Diagnosis not present

## 2016-07-18 DIAGNOSIS — I255 Ischemic cardiomyopathy: Secondary | ICD-10-CM | POA: Diagnosis not present

## 2016-07-18 DIAGNOSIS — Z87440 Personal history of urinary (tract) infections: Secondary | ICD-10-CM | POA: Diagnosis not present

## 2016-07-18 DIAGNOSIS — E1122 Type 2 diabetes mellitus with diabetic chronic kidney disease: Secondary | ICD-10-CM | POA: Diagnosis not present

## 2016-07-18 DIAGNOSIS — E785 Hyperlipidemia, unspecified: Secondary | ICD-10-CM | POA: Diagnosis not present

## 2016-07-18 DIAGNOSIS — N183 Chronic kidney disease, stage 3 (moderate): Secondary | ICD-10-CM | POA: Diagnosis not present

## 2016-07-18 DIAGNOSIS — I2510 Atherosclerotic heart disease of native coronary artery without angina pectoris: Secondary | ICD-10-CM | POA: Diagnosis not present

## 2016-07-18 DIAGNOSIS — I2584 Coronary atherosclerosis due to calcified coronary lesion: Secondary | ICD-10-CM | POA: Diagnosis not present

## 2016-07-22 ENCOUNTER — Ambulatory Visit (INDEPENDENT_AMBULATORY_CARE_PROVIDER_SITE_OTHER): Payer: Medicare Other | Admitting: *Deleted

## 2016-07-22 DIAGNOSIS — I130 Hypertensive heart and chronic kidney disease with heart failure and stage 1 through stage 4 chronic kidney disease, or unspecified chronic kidney disease: Secondary | ICD-10-CM | POA: Diagnosis not present

## 2016-07-22 DIAGNOSIS — I2584 Coronary atherosclerosis due to calcified coronary lesion: Secondary | ICD-10-CM | POA: Diagnosis not present

## 2016-07-22 DIAGNOSIS — R2681 Unsteadiness on feet: Secondary | ICD-10-CM | POA: Diagnosis not present

## 2016-07-22 DIAGNOSIS — I2510 Atherosclerotic heart disease of native coronary artery without angina pectoris: Secondary | ICD-10-CM | POA: Diagnosis not present

## 2016-07-22 DIAGNOSIS — J9611 Chronic respiratory failure with hypoxia: Secondary | ICD-10-CM | POA: Diagnosis not present

## 2016-07-22 DIAGNOSIS — E1140 Type 2 diabetes mellitus with diabetic neuropathy, unspecified: Secondary | ICD-10-CM | POA: Diagnosis not present

## 2016-07-22 DIAGNOSIS — I5042 Chronic combined systolic (congestive) and diastolic (congestive) heart failure: Secondary | ICD-10-CM | POA: Diagnosis not present

## 2016-07-22 DIAGNOSIS — I252 Old myocardial infarction: Secondary | ICD-10-CM | POA: Diagnosis not present

## 2016-07-22 DIAGNOSIS — Z5181 Encounter for therapeutic drug level monitoring: Secondary | ICD-10-CM

## 2016-07-22 DIAGNOSIS — E785 Hyperlipidemia, unspecified: Secondary | ICD-10-CM | POA: Diagnosis not present

## 2016-07-22 DIAGNOSIS — E1122 Type 2 diabetes mellitus with diabetic chronic kidney disease: Secondary | ICD-10-CM | POA: Diagnosis not present

## 2016-07-22 DIAGNOSIS — I255 Ischemic cardiomyopathy: Secondary | ICD-10-CM | POA: Diagnosis not present

## 2016-07-22 DIAGNOSIS — M129 Arthropathy, unspecified: Secondary | ICD-10-CM | POA: Diagnosis not present

## 2016-07-22 DIAGNOSIS — Z87440 Personal history of urinary (tract) infections: Secondary | ICD-10-CM | POA: Diagnosis not present

## 2016-07-22 DIAGNOSIS — N183 Chronic kidney disease, stage 3 (moderate): Secondary | ICD-10-CM | POA: Diagnosis not present

## 2016-07-22 LAB — POCT INR: INR: 2.2

## 2016-07-23 DIAGNOSIS — E785 Hyperlipidemia, unspecified: Secondary | ICD-10-CM | POA: Diagnosis not present

## 2016-07-23 DIAGNOSIS — I252 Old myocardial infarction: Secondary | ICD-10-CM | POA: Diagnosis not present

## 2016-07-23 DIAGNOSIS — J9611 Chronic respiratory failure with hypoxia: Secondary | ICD-10-CM | POA: Diagnosis not present

## 2016-07-23 DIAGNOSIS — I2510 Atherosclerotic heart disease of native coronary artery without angina pectoris: Secondary | ICD-10-CM | POA: Diagnosis not present

## 2016-07-23 DIAGNOSIS — E1140 Type 2 diabetes mellitus with diabetic neuropathy, unspecified: Secondary | ICD-10-CM | POA: Diagnosis not present

## 2016-07-23 DIAGNOSIS — I255 Ischemic cardiomyopathy: Secondary | ICD-10-CM | POA: Diagnosis not present

## 2016-07-23 DIAGNOSIS — N183 Chronic kidney disease, stage 3 (moderate): Secondary | ICD-10-CM | POA: Diagnosis not present

## 2016-07-23 DIAGNOSIS — E1122 Type 2 diabetes mellitus with diabetic chronic kidney disease: Secondary | ICD-10-CM | POA: Diagnosis not present

## 2016-07-23 DIAGNOSIS — I2584 Coronary atherosclerosis due to calcified coronary lesion: Secondary | ICD-10-CM | POA: Diagnosis not present

## 2016-07-23 DIAGNOSIS — Z87440 Personal history of urinary (tract) infections: Secondary | ICD-10-CM | POA: Diagnosis not present

## 2016-07-23 DIAGNOSIS — I130 Hypertensive heart and chronic kidney disease with heart failure and stage 1 through stage 4 chronic kidney disease, or unspecified chronic kidney disease: Secondary | ICD-10-CM | POA: Diagnosis not present

## 2016-07-23 DIAGNOSIS — I5042 Chronic combined systolic (congestive) and diastolic (congestive) heart failure: Secondary | ICD-10-CM | POA: Diagnosis not present

## 2016-07-24 DIAGNOSIS — J9611 Chronic respiratory failure with hypoxia: Secondary | ICD-10-CM | POA: Diagnosis not present

## 2016-07-24 DIAGNOSIS — Z87440 Personal history of urinary (tract) infections: Secondary | ICD-10-CM | POA: Diagnosis not present

## 2016-07-24 DIAGNOSIS — I255 Ischemic cardiomyopathy: Secondary | ICD-10-CM | POA: Diagnosis not present

## 2016-07-24 DIAGNOSIS — I5042 Chronic combined systolic (congestive) and diastolic (congestive) heart failure: Secondary | ICD-10-CM | POA: Diagnosis not present

## 2016-07-24 DIAGNOSIS — I130 Hypertensive heart and chronic kidney disease with heart failure and stage 1 through stage 4 chronic kidney disease, or unspecified chronic kidney disease: Secondary | ICD-10-CM | POA: Diagnosis not present

## 2016-07-24 DIAGNOSIS — E1140 Type 2 diabetes mellitus with diabetic neuropathy, unspecified: Secondary | ICD-10-CM | POA: Diagnosis not present

## 2016-07-24 DIAGNOSIS — I252 Old myocardial infarction: Secondary | ICD-10-CM | POA: Diagnosis not present

## 2016-07-24 DIAGNOSIS — E785 Hyperlipidemia, unspecified: Secondary | ICD-10-CM | POA: Diagnosis not present

## 2016-07-24 DIAGNOSIS — E1122 Type 2 diabetes mellitus with diabetic chronic kidney disease: Secondary | ICD-10-CM | POA: Diagnosis not present

## 2016-07-24 DIAGNOSIS — I2584 Coronary atherosclerosis due to calcified coronary lesion: Secondary | ICD-10-CM | POA: Diagnosis not present

## 2016-07-24 DIAGNOSIS — N183 Chronic kidney disease, stage 3 (moderate): Secondary | ICD-10-CM | POA: Diagnosis not present

## 2016-07-24 DIAGNOSIS — I2510 Atherosclerotic heart disease of native coronary artery without angina pectoris: Secondary | ICD-10-CM | POA: Diagnosis not present

## 2016-07-25 DIAGNOSIS — I252 Old myocardial infarction: Secondary | ICD-10-CM | POA: Diagnosis not present

## 2016-07-25 DIAGNOSIS — Z87440 Personal history of urinary (tract) infections: Secondary | ICD-10-CM | POA: Diagnosis not present

## 2016-07-25 DIAGNOSIS — I255 Ischemic cardiomyopathy: Secondary | ICD-10-CM | POA: Diagnosis not present

## 2016-07-25 DIAGNOSIS — I2510 Atherosclerotic heart disease of native coronary artery without angina pectoris: Secondary | ICD-10-CM | POA: Diagnosis not present

## 2016-07-25 DIAGNOSIS — I130 Hypertensive heart and chronic kidney disease with heart failure and stage 1 through stage 4 chronic kidney disease, or unspecified chronic kidney disease: Secondary | ICD-10-CM | POA: Diagnosis not present

## 2016-07-25 DIAGNOSIS — E785 Hyperlipidemia, unspecified: Secondary | ICD-10-CM | POA: Diagnosis not present

## 2016-07-25 DIAGNOSIS — E1122 Type 2 diabetes mellitus with diabetic chronic kidney disease: Secondary | ICD-10-CM | POA: Diagnosis not present

## 2016-07-25 DIAGNOSIS — I2584 Coronary atherosclerosis due to calcified coronary lesion: Secondary | ICD-10-CM | POA: Diagnosis not present

## 2016-07-25 DIAGNOSIS — J9611 Chronic respiratory failure with hypoxia: Secondary | ICD-10-CM | POA: Diagnosis not present

## 2016-07-25 DIAGNOSIS — E1140 Type 2 diabetes mellitus with diabetic neuropathy, unspecified: Secondary | ICD-10-CM | POA: Diagnosis not present

## 2016-07-25 DIAGNOSIS — N183 Chronic kidney disease, stage 3 (moderate): Secondary | ICD-10-CM | POA: Diagnosis not present

## 2016-07-25 DIAGNOSIS — I5042 Chronic combined systolic (congestive) and diastolic (congestive) heart failure: Secondary | ICD-10-CM | POA: Diagnosis not present

## 2016-07-28 DIAGNOSIS — E1140 Type 2 diabetes mellitus with diabetic neuropathy, unspecified: Secondary | ICD-10-CM | POA: Diagnosis not present

## 2016-07-28 DIAGNOSIS — N183 Chronic kidney disease, stage 3 (moderate): Secondary | ICD-10-CM | POA: Diagnosis not present

## 2016-07-28 DIAGNOSIS — J9611 Chronic respiratory failure with hypoxia: Secondary | ICD-10-CM | POA: Diagnosis not present

## 2016-07-28 DIAGNOSIS — I255 Ischemic cardiomyopathy: Secondary | ICD-10-CM | POA: Diagnosis not present

## 2016-07-28 DIAGNOSIS — Z87440 Personal history of urinary (tract) infections: Secondary | ICD-10-CM | POA: Diagnosis not present

## 2016-07-28 DIAGNOSIS — E785 Hyperlipidemia, unspecified: Secondary | ICD-10-CM | POA: Diagnosis not present

## 2016-07-28 DIAGNOSIS — E1122 Type 2 diabetes mellitus with diabetic chronic kidney disease: Secondary | ICD-10-CM | POA: Diagnosis not present

## 2016-07-28 DIAGNOSIS — I252 Old myocardial infarction: Secondary | ICD-10-CM | POA: Diagnosis not present

## 2016-07-28 DIAGNOSIS — I130 Hypertensive heart and chronic kidney disease with heart failure and stage 1 through stage 4 chronic kidney disease, or unspecified chronic kidney disease: Secondary | ICD-10-CM | POA: Diagnosis not present

## 2016-07-28 DIAGNOSIS — I5042 Chronic combined systolic (congestive) and diastolic (congestive) heart failure: Secondary | ICD-10-CM | POA: Diagnosis not present

## 2016-07-28 DIAGNOSIS — I2510 Atherosclerotic heart disease of native coronary artery without angina pectoris: Secondary | ICD-10-CM | POA: Diagnosis not present

## 2016-07-28 DIAGNOSIS — I2584 Coronary atherosclerosis due to calcified coronary lesion: Secondary | ICD-10-CM | POA: Diagnosis not present

## 2016-07-29 ENCOUNTER — Ambulatory Visit (INDEPENDENT_AMBULATORY_CARE_PROVIDER_SITE_OTHER): Payer: Medicare Other | Admitting: *Deleted

## 2016-07-29 ENCOUNTER — Telehealth: Payer: Self-pay | Admitting: Cardiology

## 2016-07-29 DIAGNOSIS — N183 Chronic kidney disease, stage 3 (moderate): Secondary | ICD-10-CM | POA: Diagnosis not present

## 2016-07-29 DIAGNOSIS — I2584 Coronary atherosclerosis due to calcified coronary lesion: Secondary | ICD-10-CM | POA: Diagnosis not present

## 2016-07-29 DIAGNOSIS — E1140 Type 2 diabetes mellitus with diabetic neuropathy, unspecified: Secondary | ICD-10-CM | POA: Diagnosis not present

## 2016-07-29 DIAGNOSIS — J9611 Chronic respiratory failure with hypoxia: Secondary | ICD-10-CM | POA: Diagnosis not present

## 2016-07-29 DIAGNOSIS — Z87440 Personal history of urinary (tract) infections: Secondary | ICD-10-CM | POA: Diagnosis not present

## 2016-07-29 DIAGNOSIS — I252 Old myocardial infarction: Secondary | ICD-10-CM | POA: Diagnosis not present

## 2016-07-29 DIAGNOSIS — I130 Hypertensive heart and chronic kidney disease with heart failure and stage 1 through stage 4 chronic kidney disease, or unspecified chronic kidney disease: Secondary | ICD-10-CM | POA: Diagnosis not present

## 2016-07-29 DIAGNOSIS — Z5181 Encounter for therapeutic drug level monitoring: Secondary | ICD-10-CM

## 2016-07-29 DIAGNOSIS — I5042 Chronic combined systolic (congestive) and diastolic (congestive) heart failure: Secondary | ICD-10-CM | POA: Diagnosis not present

## 2016-07-29 DIAGNOSIS — E785 Hyperlipidemia, unspecified: Secondary | ICD-10-CM | POA: Diagnosis not present

## 2016-07-29 DIAGNOSIS — I2510 Atherosclerotic heart disease of native coronary artery without angina pectoris: Secondary | ICD-10-CM | POA: Diagnosis not present

## 2016-07-29 DIAGNOSIS — I255 Ischemic cardiomyopathy: Secondary | ICD-10-CM | POA: Diagnosis not present

## 2016-07-29 DIAGNOSIS — E1122 Type 2 diabetes mellitus with diabetic chronic kidney disease: Secondary | ICD-10-CM | POA: Diagnosis not present

## 2016-07-29 LAB — POCT INR: INR: 2.1

## 2016-07-29 NOTE — Telephone Encounter (Signed)
Anderson Malta with [facility]   [phone]  PT  25 Inr  2.1

## 2016-07-29 NOTE — Telephone Encounter (Signed)
Done.  See coumadin note. 

## 2016-07-30 DIAGNOSIS — E785 Hyperlipidemia, unspecified: Secondary | ICD-10-CM | POA: Diagnosis not present

## 2016-07-30 DIAGNOSIS — I252 Old myocardial infarction: Secondary | ICD-10-CM | POA: Diagnosis not present

## 2016-07-30 DIAGNOSIS — I2584 Coronary atherosclerosis due to calcified coronary lesion: Secondary | ICD-10-CM | POA: Diagnosis not present

## 2016-07-30 DIAGNOSIS — J9611 Chronic respiratory failure with hypoxia: Secondary | ICD-10-CM | POA: Diagnosis not present

## 2016-07-30 DIAGNOSIS — E1122 Type 2 diabetes mellitus with diabetic chronic kidney disease: Secondary | ICD-10-CM | POA: Diagnosis not present

## 2016-07-30 DIAGNOSIS — N183 Chronic kidney disease, stage 3 (moderate): Secondary | ICD-10-CM | POA: Diagnosis not present

## 2016-07-30 DIAGNOSIS — I5042 Chronic combined systolic (congestive) and diastolic (congestive) heart failure: Secondary | ICD-10-CM | POA: Diagnosis not present

## 2016-07-30 DIAGNOSIS — E1140 Type 2 diabetes mellitus with diabetic neuropathy, unspecified: Secondary | ICD-10-CM | POA: Diagnosis not present

## 2016-07-30 DIAGNOSIS — I255 Ischemic cardiomyopathy: Secondary | ICD-10-CM | POA: Diagnosis not present

## 2016-07-30 DIAGNOSIS — Z87440 Personal history of urinary (tract) infections: Secondary | ICD-10-CM | POA: Diagnosis not present

## 2016-07-30 DIAGNOSIS — I130 Hypertensive heart and chronic kidney disease with heart failure and stage 1 through stage 4 chronic kidney disease, or unspecified chronic kidney disease: Secondary | ICD-10-CM | POA: Diagnosis not present

## 2016-07-30 DIAGNOSIS — I2510 Atherosclerotic heart disease of native coronary artery without angina pectoris: Secondary | ICD-10-CM | POA: Diagnosis not present

## 2016-07-31 DIAGNOSIS — E1122 Type 2 diabetes mellitus with diabetic chronic kidney disease: Secondary | ICD-10-CM | POA: Diagnosis not present

## 2016-07-31 DIAGNOSIS — I252 Old myocardial infarction: Secondary | ICD-10-CM | POA: Diagnosis not present

## 2016-07-31 DIAGNOSIS — N183 Chronic kidney disease, stage 3 (moderate): Secondary | ICD-10-CM | POA: Diagnosis not present

## 2016-07-31 DIAGNOSIS — I2584 Coronary atherosclerosis due to calcified coronary lesion: Secondary | ICD-10-CM | POA: Diagnosis not present

## 2016-07-31 DIAGNOSIS — I5042 Chronic combined systolic (congestive) and diastolic (congestive) heart failure: Secondary | ICD-10-CM | POA: Diagnosis not present

## 2016-07-31 DIAGNOSIS — J9611 Chronic respiratory failure with hypoxia: Secondary | ICD-10-CM | POA: Diagnosis not present

## 2016-07-31 DIAGNOSIS — I130 Hypertensive heart and chronic kidney disease with heart failure and stage 1 through stage 4 chronic kidney disease, or unspecified chronic kidney disease: Secondary | ICD-10-CM | POA: Diagnosis not present

## 2016-07-31 DIAGNOSIS — I2510 Atherosclerotic heart disease of native coronary artery without angina pectoris: Secondary | ICD-10-CM | POA: Diagnosis not present

## 2016-07-31 DIAGNOSIS — E785 Hyperlipidemia, unspecified: Secondary | ICD-10-CM | POA: Diagnosis not present

## 2016-07-31 DIAGNOSIS — Z87440 Personal history of urinary (tract) infections: Secondary | ICD-10-CM | POA: Diagnosis not present

## 2016-07-31 DIAGNOSIS — I255 Ischemic cardiomyopathy: Secondary | ICD-10-CM | POA: Diagnosis not present

## 2016-07-31 DIAGNOSIS — E1140 Type 2 diabetes mellitus with diabetic neuropathy, unspecified: Secondary | ICD-10-CM | POA: Diagnosis not present

## 2016-08-01 DIAGNOSIS — I252 Old myocardial infarction: Secondary | ICD-10-CM | POA: Diagnosis not present

## 2016-08-01 DIAGNOSIS — N183 Chronic kidney disease, stage 3 (moderate): Secondary | ICD-10-CM | POA: Diagnosis not present

## 2016-08-01 DIAGNOSIS — I2584 Coronary atherosclerosis due to calcified coronary lesion: Secondary | ICD-10-CM | POA: Diagnosis not present

## 2016-08-01 DIAGNOSIS — E785 Hyperlipidemia, unspecified: Secondary | ICD-10-CM | POA: Diagnosis not present

## 2016-08-01 DIAGNOSIS — I255 Ischemic cardiomyopathy: Secondary | ICD-10-CM | POA: Diagnosis not present

## 2016-08-01 DIAGNOSIS — E1140 Type 2 diabetes mellitus with diabetic neuropathy, unspecified: Secondary | ICD-10-CM | POA: Diagnosis not present

## 2016-08-01 DIAGNOSIS — E1122 Type 2 diabetes mellitus with diabetic chronic kidney disease: Secondary | ICD-10-CM | POA: Diagnosis not present

## 2016-08-01 DIAGNOSIS — I130 Hypertensive heart and chronic kidney disease with heart failure and stage 1 through stage 4 chronic kidney disease, or unspecified chronic kidney disease: Secondary | ICD-10-CM | POA: Diagnosis not present

## 2016-08-01 DIAGNOSIS — I2510 Atherosclerotic heart disease of native coronary artery without angina pectoris: Secondary | ICD-10-CM | POA: Diagnosis not present

## 2016-08-01 DIAGNOSIS — J9611 Chronic respiratory failure with hypoxia: Secondary | ICD-10-CM | POA: Diagnosis not present

## 2016-08-01 DIAGNOSIS — I5042 Chronic combined systolic (congestive) and diastolic (congestive) heart failure: Secondary | ICD-10-CM | POA: Diagnosis not present

## 2016-08-01 DIAGNOSIS — Z87440 Personal history of urinary (tract) infections: Secondary | ICD-10-CM | POA: Diagnosis not present

## 2016-08-05 ENCOUNTER — Telehealth: Payer: Self-pay | Admitting: Cardiology

## 2016-08-05 ENCOUNTER — Ambulatory Visit (INDEPENDENT_AMBULATORY_CARE_PROVIDER_SITE_OTHER): Payer: Medicare Other | Admitting: *Deleted

## 2016-08-05 DIAGNOSIS — J9611 Chronic respiratory failure with hypoxia: Secondary | ICD-10-CM | POA: Diagnosis not present

## 2016-08-05 DIAGNOSIS — I252 Old myocardial infarction: Secondary | ICD-10-CM | POA: Diagnosis not present

## 2016-08-05 DIAGNOSIS — E1140 Type 2 diabetes mellitus with diabetic neuropathy, unspecified: Secondary | ICD-10-CM | POA: Diagnosis not present

## 2016-08-05 DIAGNOSIS — I5042 Chronic combined systolic (congestive) and diastolic (congestive) heart failure: Secondary | ICD-10-CM | POA: Diagnosis not present

## 2016-08-05 DIAGNOSIS — E785 Hyperlipidemia, unspecified: Secondary | ICD-10-CM | POA: Diagnosis not present

## 2016-08-05 DIAGNOSIS — Z87440 Personal history of urinary (tract) infections: Secondary | ICD-10-CM | POA: Diagnosis not present

## 2016-08-05 DIAGNOSIS — N183 Chronic kidney disease, stage 3 (moderate): Secondary | ICD-10-CM | POA: Diagnosis not present

## 2016-08-05 DIAGNOSIS — I130 Hypertensive heart and chronic kidney disease with heart failure and stage 1 through stage 4 chronic kidney disease, or unspecified chronic kidney disease: Secondary | ICD-10-CM | POA: Diagnosis not present

## 2016-08-05 DIAGNOSIS — I2510 Atherosclerotic heart disease of native coronary artery without angina pectoris: Secondary | ICD-10-CM | POA: Diagnosis not present

## 2016-08-05 DIAGNOSIS — I255 Ischemic cardiomyopathy: Secondary | ICD-10-CM | POA: Diagnosis not present

## 2016-08-05 DIAGNOSIS — E1122 Type 2 diabetes mellitus with diabetic chronic kidney disease: Secondary | ICD-10-CM | POA: Diagnosis not present

## 2016-08-05 DIAGNOSIS — Z5181 Encounter for therapeutic drug level monitoring: Secondary | ICD-10-CM

## 2016-08-05 DIAGNOSIS — I2584 Coronary atherosclerosis due to calcified coronary lesion: Secondary | ICD-10-CM | POA: Diagnosis not present

## 2016-08-05 LAB — POCT INR: INR: 3.2

## 2016-08-05 NOTE — Telephone Encounter (Signed)
Anderson Malta with [facility]  [phone]   Pt  38.8     inr 3.2

## 2016-08-05 NOTE — Telephone Encounter (Signed)
Done.  See coumadin note. 

## 2016-08-06 DIAGNOSIS — E1140 Type 2 diabetes mellitus with diabetic neuropathy, unspecified: Secondary | ICD-10-CM | POA: Diagnosis not present

## 2016-08-06 DIAGNOSIS — I255 Ischemic cardiomyopathy: Secondary | ICD-10-CM | POA: Diagnosis not present

## 2016-08-06 DIAGNOSIS — I130 Hypertensive heart and chronic kidney disease with heart failure and stage 1 through stage 4 chronic kidney disease, or unspecified chronic kidney disease: Secondary | ICD-10-CM | POA: Diagnosis not present

## 2016-08-06 DIAGNOSIS — N183 Chronic kidney disease, stage 3 (moderate): Secondary | ICD-10-CM | POA: Diagnosis not present

## 2016-08-06 DIAGNOSIS — I252 Old myocardial infarction: Secondary | ICD-10-CM | POA: Diagnosis not present

## 2016-08-06 DIAGNOSIS — I5042 Chronic combined systolic (congestive) and diastolic (congestive) heart failure: Secondary | ICD-10-CM | POA: Diagnosis not present

## 2016-08-06 DIAGNOSIS — I2510 Atherosclerotic heart disease of native coronary artery without angina pectoris: Secondary | ICD-10-CM | POA: Diagnosis not present

## 2016-08-06 DIAGNOSIS — E1122 Type 2 diabetes mellitus with diabetic chronic kidney disease: Secondary | ICD-10-CM | POA: Diagnosis not present

## 2016-08-06 DIAGNOSIS — Z87440 Personal history of urinary (tract) infections: Secondary | ICD-10-CM | POA: Diagnosis not present

## 2016-08-06 DIAGNOSIS — E785 Hyperlipidemia, unspecified: Secondary | ICD-10-CM | POA: Diagnosis not present

## 2016-08-06 DIAGNOSIS — J9611 Chronic respiratory failure with hypoxia: Secondary | ICD-10-CM | POA: Diagnosis not present

## 2016-08-06 DIAGNOSIS — I2584 Coronary atherosclerosis due to calcified coronary lesion: Secondary | ICD-10-CM | POA: Diagnosis not present

## 2016-08-07 DIAGNOSIS — I252 Old myocardial infarction: Secondary | ICD-10-CM | POA: Diagnosis not present

## 2016-08-07 DIAGNOSIS — N183 Chronic kidney disease, stage 3 (moderate): Secondary | ICD-10-CM | POA: Diagnosis not present

## 2016-08-07 DIAGNOSIS — J9611 Chronic respiratory failure with hypoxia: Secondary | ICD-10-CM | POA: Diagnosis not present

## 2016-08-07 DIAGNOSIS — E1140 Type 2 diabetes mellitus with diabetic neuropathy, unspecified: Secondary | ICD-10-CM | POA: Diagnosis not present

## 2016-08-07 DIAGNOSIS — I255 Ischemic cardiomyopathy: Secondary | ICD-10-CM | POA: Diagnosis not present

## 2016-08-07 DIAGNOSIS — I130 Hypertensive heart and chronic kidney disease with heart failure and stage 1 through stage 4 chronic kidney disease, or unspecified chronic kidney disease: Secondary | ICD-10-CM | POA: Diagnosis not present

## 2016-08-07 DIAGNOSIS — E1122 Type 2 diabetes mellitus with diabetic chronic kidney disease: Secondary | ICD-10-CM | POA: Diagnosis not present

## 2016-08-07 DIAGNOSIS — E785 Hyperlipidemia, unspecified: Secondary | ICD-10-CM | POA: Diagnosis not present

## 2016-08-07 DIAGNOSIS — I2510 Atherosclerotic heart disease of native coronary artery without angina pectoris: Secondary | ICD-10-CM | POA: Diagnosis not present

## 2016-08-07 DIAGNOSIS — I5042 Chronic combined systolic (congestive) and diastolic (congestive) heart failure: Secondary | ICD-10-CM | POA: Diagnosis not present

## 2016-08-07 DIAGNOSIS — I2584 Coronary atherosclerosis due to calcified coronary lesion: Secondary | ICD-10-CM | POA: Diagnosis not present

## 2016-08-07 DIAGNOSIS — Z87440 Personal history of urinary (tract) infections: Secondary | ICD-10-CM | POA: Diagnosis not present

## 2016-08-08 DIAGNOSIS — I5042 Chronic combined systolic (congestive) and diastolic (congestive) heart failure: Secondary | ICD-10-CM | POA: Diagnosis not present

## 2016-08-08 DIAGNOSIS — E1140 Type 2 diabetes mellitus with diabetic neuropathy, unspecified: Secondary | ICD-10-CM | POA: Diagnosis not present

## 2016-08-08 DIAGNOSIS — I2584 Coronary atherosclerosis due to calcified coronary lesion: Secondary | ICD-10-CM | POA: Diagnosis not present

## 2016-08-08 DIAGNOSIS — I255 Ischemic cardiomyopathy: Secondary | ICD-10-CM | POA: Diagnosis not present

## 2016-08-08 DIAGNOSIS — E1122 Type 2 diabetes mellitus with diabetic chronic kidney disease: Secondary | ICD-10-CM | POA: Diagnosis not present

## 2016-08-08 DIAGNOSIS — I252 Old myocardial infarction: Secondary | ICD-10-CM | POA: Diagnosis not present

## 2016-08-08 DIAGNOSIS — Z87440 Personal history of urinary (tract) infections: Secondary | ICD-10-CM | POA: Diagnosis not present

## 2016-08-08 DIAGNOSIS — N183 Chronic kidney disease, stage 3 (moderate): Secondary | ICD-10-CM | POA: Diagnosis not present

## 2016-08-08 DIAGNOSIS — J9611 Chronic respiratory failure with hypoxia: Secondary | ICD-10-CM | POA: Diagnosis not present

## 2016-08-08 DIAGNOSIS — I2510 Atherosclerotic heart disease of native coronary artery without angina pectoris: Secondary | ICD-10-CM | POA: Diagnosis not present

## 2016-08-08 DIAGNOSIS — E785 Hyperlipidemia, unspecified: Secondary | ICD-10-CM | POA: Diagnosis not present

## 2016-08-08 DIAGNOSIS — I130 Hypertensive heart and chronic kidney disease with heart failure and stage 1 through stage 4 chronic kidney disease, or unspecified chronic kidney disease: Secondary | ICD-10-CM | POA: Diagnosis not present

## 2016-08-09 DIAGNOSIS — I509 Heart failure, unspecified: Secondary | ICD-10-CM | POA: Diagnosis not present

## 2016-08-09 DIAGNOSIS — R0602 Shortness of breath: Secondary | ICD-10-CM | POA: Diagnosis not present

## 2016-08-11 DIAGNOSIS — I2510 Atherosclerotic heart disease of native coronary artery without angina pectoris: Secondary | ICD-10-CM | POA: Diagnosis not present

## 2016-08-11 DIAGNOSIS — N183 Chronic kidney disease, stage 3 (moderate): Secondary | ICD-10-CM | POA: Diagnosis not present

## 2016-08-11 DIAGNOSIS — I252 Old myocardial infarction: Secondary | ICD-10-CM | POA: Diagnosis not present

## 2016-08-11 DIAGNOSIS — J9611 Chronic respiratory failure with hypoxia: Secondary | ICD-10-CM | POA: Diagnosis not present

## 2016-08-11 DIAGNOSIS — I130 Hypertensive heart and chronic kidney disease with heart failure and stage 1 through stage 4 chronic kidney disease, or unspecified chronic kidney disease: Secondary | ICD-10-CM | POA: Diagnosis not present

## 2016-08-11 DIAGNOSIS — Z87440 Personal history of urinary (tract) infections: Secondary | ICD-10-CM | POA: Diagnosis not present

## 2016-08-11 DIAGNOSIS — E1140 Type 2 diabetes mellitus with diabetic neuropathy, unspecified: Secondary | ICD-10-CM | POA: Diagnosis not present

## 2016-08-11 DIAGNOSIS — I2584 Coronary atherosclerosis due to calcified coronary lesion: Secondary | ICD-10-CM | POA: Diagnosis not present

## 2016-08-11 DIAGNOSIS — E1122 Type 2 diabetes mellitus with diabetic chronic kidney disease: Secondary | ICD-10-CM | POA: Diagnosis not present

## 2016-08-11 DIAGNOSIS — I255 Ischemic cardiomyopathy: Secondary | ICD-10-CM | POA: Diagnosis not present

## 2016-08-11 DIAGNOSIS — I5042 Chronic combined systolic (congestive) and diastolic (congestive) heart failure: Secondary | ICD-10-CM | POA: Diagnosis not present

## 2016-08-11 DIAGNOSIS — E785 Hyperlipidemia, unspecified: Secondary | ICD-10-CM | POA: Diagnosis not present

## 2016-08-12 ENCOUNTER — Ambulatory Visit (INDEPENDENT_AMBULATORY_CARE_PROVIDER_SITE_OTHER): Payer: Medicare Other | Admitting: *Deleted

## 2016-08-12 DIAGNOSIS — J9611 Chronic respiratory failure with hypoxia: Secondary | ICD-10-CM | POA: Diagnosis not present

## 2016-08-12 DIAGNOSIS — E785 Hyperlipidemia, unspecified: Secondary | ICD-10-CM | POA: Diagnosis not present

## 2016-08-12 DIAGNOSIS — E1122 Type 2 diabetes mellitus with diabetic chronic kidney disease: Secondary | ICD-10-CM | POA: Diagnosis not present

## 2016-08-12 DIAGNOSIS — I5042 Chronic combined systolic (congestive) and diastolic (congestive) heart failure: Secondary | ICD-10-CM | POA: Diagnosis not present

## 2016-08-12 DIAGNOSIS — Z5181 Encounter for therapeutic drug level monitoring: Secondary | ICD-10-CM

## 2016-08-12 DIAGNOSIS — I255 Ischemic cardiomyopathy: Secondary | ICD-10-CM | POA: Diagnosis not present

## 2016-08-12 DIAGNOSIS — I2584 Coronary atherosclerosis due to calcified coronary lesion: Secondary | ICD-10-CM | POA: Diagnosis not present

## 2016-08-12 DIAGNOSIS — E1140 Type 2 diabetes mellitus with diabetic neuropathy, unspecified: Secondary | ICD-10-CM | POA: Diagnosis not present

## 2016-08-12 DIAGNOSIS — N183 Chronic kidney disease, stage 3 (moderate): Secondary | ICD-10-CM | POA: Diagnosis not present

## 2016-08-12 DIAGNOSIS — I252 Old myocardial infarction: Secondary | ICD-10-CM | POA: Diagnosis not present

## 2016-08-12 DIAGNOSIS — Z87440 Personal history of urinary (tract) infections: Secondary | ICD-10-CM | POA: Diagnosis not present

## 2016-08-12 DIAGNOSIS — I130 Hypertensive heart and chronic kidney disease with heart failure and stage 1 through stage 4 chronic kidney disease, or unspecified chronic kidney disease: Secondary | ICD-10-CM | POA: Diagnosis not present

## 2016-08-12 DIAGNOSIS — I2510 Atherosclerotic heart disease of native coronary artery without angina pectoris: Secondary | ICD-10-CM | POA: Diagnosis not present

## 2016-08-12 LAB — POCT INR: INR: 2.4

## 2016-08-13 DIAGNOSIS — I2510 Atherosclerotic heart disease of native coronary artery without angina pectoris: Secondary | ICD-10-CM | POA: Diagnosis not present

## 2016-08-13 DIAGNOSIS — J9611 Chronic respiratory failure with hypoxia: Secondary | ICD-10-CM | POA: Diagnosis not present

## 2016-08-13 DIAGNOSIS — I2584 Coronary atherosclerosis due to calcified coronary lesion: Secondary | ICD-10-CM | POA: Diagnosis not present

## 2016-08-13 DIAGNOSIS — I252 Old myocardial infarction: Secondary | ICD-10-CM | POA: Diagnosis not present

## 2016-08-13 DIAGNOSIS — E1140 Type 2 diabetes mellitus with diabetic neuropathy, unspecified: Secondary | ICD-10-CM | POA: Diagnosis not present

## 2016-08-13 DIAGNOSIS — Z87440 Personal history of urinary (tract) infections: Secondary | ICD-10-CM | POA: Diagnosis not present

## 2016-08-13 DIAGNOSIS — I255 Ischemic cardiomyopathy: Secondary | ICD-10-CM | POA: Diagnosis not present

## 2016-08-13 DIAGNOSIS — E1122 Type 2 diabetes mellitus with diabetic chronic kidney disease: Secondary | ICD-10-CM | POA: Diagnosis not present

## 2016-08-13 DIAGNOSIS — E785 Hyperlipidemia, unspecified: Secondary | ICD-10-CM | POA: Diagnosis not present

## 2016-08-13 DIAGNOSIS — N183 Chronic kidney disease, stage 3 (moderate): Secondary | ICD-10-CM | POA: Diagnosis not present

## 2016-08-13 DIAGNOSIS — I5042 Chronic combined systolic (congestive) and diastolic (congestive) heart failure: Secondary | ICD-10-CM | POA: Diagnosis not present

## 2016-08-13 DIAGNOSIS — I130 Hypertensive heart and chronic kidney disease with heart failure and stage 1 through stage 4 chronic kidney disease, or unspecified chronic kidney disease: Secondary | ICD-10-CM | POA: Diagnosis not present

## 2016-08-14 ENCOUNTER — Ambulatory Visit (INDEPENDENT_AMBULATORY_CARE_PROVIDER_SITE_OTHER): Payer: Medicare Other | Admitting: Physician Assistant

## 2016-08-14 DIAGNOSIS — M25561 Pain in right knee: Secondary | ICD-10-CM | POA: Diagnosis not present

## 2016-08-14 NOTE — Progress Notes (Signed)
[name] returns today for follow-up of her right knee. She's been working with occupational therapy and physical therapy for strengthening of the knee and gait balance. She feels overall that she is improving. She is actually getting back out to normal activities such as going out to eat and going to watch the birds at the facility that she is at. She states she really doesn't have a lot of knee pain. Main problem now is just balance issues. She did get her walker and finds this to be beneficial in her ability to ambulate. She has no catching locking or painful popping plus minus giving way sensation at times.  Physical exam: Right knee she has full extension flexion without pain. No instability valgus varus stressing. No rashes skin lesions ulcerations erythema ecchymosis or effusion of the right knee.  Plan: She'll continue to work with therapy to work on gait balance also to work on Ambulance person. We'll see her back on an as-needed basis or if she develops any problems with the knee.

## 2016-08-15 DIAGNOSIS — I255 Ischemic cardiomyopathy: Secondary | ICD-10-CM | POA: Diagnosis not present

## 2016-08-15 DIAGNOSIS — I2510 Atherosclerotic heart disease of native coronary artery without angina pectoris: Secondary | ICD-10-CM | POA: Diagnosis not present

## 2016-08-15 DIAGNOSIS — Z87440 Personal history of urinary (tract) infections: Secondary | ICD-10-CM | POA: Diagnosis not present

## 2016-08-15 DIAGNOSIS — I252 Old myocardial infarction: Secondary | ICD-10-CM | POA: Diagnosis not present

## 2016-08-15 DIAGNOSIS — I5042 Chronic combined systolic (congestive) and diastolic (congestive) heart failure: Secondary | ICD-10-CM | POA: Diagnosis not present

## 2016-08-15 DIAGNOSIS — I130 Hypertensive heart and chronic kidney disease with heart failure and stage 1 through stage 4 chronic kidney disease, or unspecified chronic kidney disease: Secondary | ICD-10-CM | POA: Diagnosis not present

## 2016-08-15 DIAGNOSIS — E785 Hyperlipidemia, unspecified: Secondary | ICD-10-CM | POA: Diagnosis not present

## 2016-08-15 DIAGNOSIS — I2584 Coronary atherosclerosis due to calcified coronary lesion: Secondary | ICD-10-CM | POA: Diagnosis not present

## 2016-08-15 DIAGNOSIS — E1140 Type 2 diabetes mellitus with diabetic neuropathy, unspecified: Secondary | ICD-10-CM | POA: Diagnosis not present

## 2016-08-15 DIAGNOSIS — E1122 Type 2 diabetes mellitus with diabetic chronic kidney disease: Secondary | ICD-10-CM | POA: Diagnosis not present

## 2016-08-15 DIAGNOSIS — J9611 Chronic respiratory failure with hypoxia: Secondary | ICD-10-CM | POA: Diagnosis not present

## 2016-08-15 DIAGNOSIS — N183 Chronic kidney disease, stage 3 (moderate): Secondary | ICD-10-CM | POA: Diagnosis not present

## 2016-08-18 DIAGNOSIS — I2510 Atherosclerotic heart disease of native coronary artery without angina pectoris: Secondary | ICD-10-CM | POA: Diagnosis not present

## 2016-08-18 DIAGNOSIS — E785 Hyperlipidemia, unspecified: Secondary | ICD-10-CM | POA: Diagnosis not present

## 2016-08-18 DIAGNOSIS — J9611 Chronic respiratory failure with hypoxia: Secondary | ICD-10-CM | POA: Diagnosis not present

## 2016-08-18 DIAGNOSIS — I252 Old myocardial infarction: Secondary | ICD-10-CM | POA: Diagnosis not present

## 2016-08-18 DIAGNOSIS — I130 Hypertensive heart and chronic kidney disease with heart failure and stage 1 through stage 4 chronic kidney disease, or unspecified chronic kidney disease: Secondary | ICD-10-CM | POA: Diagnosis not present

## 2016-08-18 DIAGNOSIS — I2584 Coronary atherosclerosis due to calcified coronary lesion: Secondary | ICD-10-CM | POA: Diagnosis not present

## 2016-08-18 DIAGNOSIS — Z87440 Personal history of urinary (tract) infections: Secondary | ICD-10-CM | POA: Diagnosis not present

## 2016-08-18 DIAGNOSIS — I255 Ischemic cardiomyopathy: Secondary | ICD-10-CM | POA: Diagnosis not present

## 2016-08-18 DIAGNOSIS — E1122 Type 2 diabetes mellitus with diabetic chronic kidney disease: Secondary | ICD-10-CM | POA: Diagnosis not present

## 2016-08-18 DIAGNOSIS — E1140 Type 2 diabetes mellitus with diabetic neuropathy, unspecified: Secondary | ICD-10-CM | POA: Diagnosis not present

## 2016-08-18 DIAGNOSIS — I5042 Chronic combined systolic (congestive) and diastolic (congestive) heart failure: Secondary | ICD-10-CM | POA: Diagnosis not present

## 2016-08-18 DIAGNOSIS — N183 Chronic kidney disease, stage 3 (moderate): Secondary | ICD-10-CM | POA: Diagnosis not present

## 2016-08-19 ENCOUNTER — Telehealth: Payer: Self-pay | Admitting: *Deleted

## 2016-08-19 ENCOUNTER — Ambulatory Visit (INDEPENDENT_AMBULATORY_CARE_PROVIDER_SITE_OTHER): Payer: Medicare Other | Admitting: *Deleted

## 2016-08-19 DIAGNOSIS — E785 Hyperlipidemia, unspecified: Secondary | ICD-10-CM | POA: Diagnosis not present

## 2016-08-19 DIAGNOSIS — I5042 Chronic combined systolic (congestive) and diastolic (congestive) heart failure: Secondary | ICD-10-CM | POA: Diagnosis not present

## 2016-08-19 DIAGNOSIS — Z5181 Encounter for therapeutic drug level monitoring: Secondary | ICD-10-CM

## 2016-08-19 DIAGNOSIS — I2510 Atherosclerotic heart disease of native coronary artery without angina pectoris: Secondary | ICD-10-CM | POA: Diagnosis not present

## 2016-08-19 DIAGNOSIS — E1140 Type 2 diabetes mellitus with diabetic neuropathy, unspecified: Secondary | ICD-10-CM | POA: Diagnosis not present

## 2016-08-19 DIAGNOSIS — I2584 Coronary atherosclerosis due to calcified coronary lesion: Secondary | ICD-10-CM | POA: Diagnosis not present

## 2016-08-19 DIAGNOSIS — E1122 Type 2 diabetes mellitus with diabetic chronic kidney disease: Secondary | ICD-10-CM | POA: Diagnosis not present

## 2016-08-19 DIAGNOSIS — J9611 Chronic respiratory failure with hypoxia: Secondary | ICD-10-CM | POA: Diagnosis not present

## 2016-08-19 DIAGNOSIS — N183 Chronic kidney disease, stage 3 (moderate): Secondary | ICD-10-CM | POA: Diagnosis not present

## 2016-08-19 DIAGNOSIS — I130 Hypertensive heart and chronic kidney disease with heart failure and stage 1 through stage 4 chronic kidney disease, or unspecified chronic kidney disease: Secondary | ICD-10-CM | POA: Diagnosis not present

## 2016-08-19 DIAGNOSIS — I255 Ischemic cardiomyopathy: Secondary | ICD-10-CM | POA: Diagnosis not present

## 2016-08-19 DIAGNOSIS — Z87440 Personal history of urinary (tract) infections: Secondary | ICD-10-CM | POA: Diagnosis not present

## 2016-08-19 DIAGNOSIS — I252 Old myocardial infarction: Secondary | ICD-10-CM | POA: Diagnosis not present

## 2016-08-19 LAB — POCT INR: INR: 2.6

## 2016-08-19 NOTE — Telephone Encounter (Signed)
Done.  See coumadin note. 

## 2016-08-19 NOTE — Telephone Encounter (Signed)
[name] with [facility] called with PT/INR  Results  30.7 PT 2.6 INR    please call [phone]

## 2016-08-21 DIAGNOSIS — I5042 Chronic combined systolic (congestive) and diastolic (congestive) heart failure: Secondary | ICD-10-CM | POA: Diagnosis not present

## 2016-08-21 DIAGNOSIS — N183 Chronic kidney disease, stage 3 (moderate): Secondary | ICD-10-CM | POA: Diagnosis not present

## 2016-08-21 DIAGNOSIS — Z87440 Personal history of urinary (tract) infections: Secondary | ICD-10-CM | POA: Diagnosis not present

## 2016-08-21 DIAGNOSIS — J9611 Chronic respiratory failure with hypoxia: Secondary | ICD-10-CM | POA: Diagnosis not present

## 2016-08-21 DIAGNOSIS — E785 Hyperlipidemia, unspecified: Secondary | ICD-10-CM | POA: Diagnosis not present

## 2016-08-21 DIAGNOSIS — I252 Old myocardial infarction: Secondary | ICD-10-CM | POA: Diagnosis not present

## 2016-08-21 DIAGNOSIS — I2510 Atherosclerotic heart disease of native coronary artery without angina pectoris: Secondary | ICD-10-CM | POA: Diagnosis not present

## 2016-08-21 DIAGNOSIS — E1140 Type 2 diabetes mellitus with diabetic neuropathy, unspecified: Secondary | ICD-10-CM | POA: Diagnosis not present

## 2016-08-21 DIAGNOSIS — E1122 Type 2 diabetes mellitus with diabetic chronic kidney disease: Secondary | ICD-10-CM | POA: Diagnosis not present

## 2016-08-21 DIAGNOSIS — I255 Ischemic cardiomyopathy: Secondary | ICD-10-CM | POA: Diagnosis not present

## 2016-08-21 DIAGNOSIS — I130 Hypertensive heart and chronic kidney disease with heart failure and stage 1 through stage 4 chronic kidney disease, or unspecified chronic kidney disease: Secondary | ICD-10-CM | POA: Diagnosis not present

## 2016-08-21 DIAGNOSIS — I2584 Coronary atherosclerosis due to calcified coronary lesion: Secondary | ICD-10-CM | POA: Diagnosis not present

## 2016-09-01 ENCOUNTER — Ambulatory Visit (INDEPENDENT_AMBULATORY_CARE_PROVIDER_SITE_OTHER): Payer: Medicare Other | Admitting: *Deleted

## 2016-09-01 DIAGNOSIS — Z5181 Encounter for therapeutic drug level monitoring: Secondary | ICD-10-CM

## 2016-09-01 DIAGNOSIS — I236 Thrombosis of atrium, auricular appendage, and ventricle as current complications following acute myocardial infarction: Secondary | ICD-10-CM

## 2016-09-01 LAB — POCT INR: INR: 1.9

## 2016-09-09 DIAGNOSIS — R0602 Shortness of breath: Secondary | ICD-10-CM | POA: Diagnosis not present

## 2016-09-09 DIAGNOSIS — I509 Heart failure, unspecified: Secondary | ICD-10-CM | POA: Diagnosis not present

## 2016-09-10 DIAGNOSIS — E7800 Pure hypercholesterolemia, unspecified: Secondary | ICD-10-CM | POA: Diagnosis not present

## 2016-09-10 DIAGNOSIS — I10 Essential (primary) hypertension: Secondary | ICD-10-CM | POA: Diagnosis not present

## 2016-09-10 DIAGNOSIS — E119 Type 2 diabetes mellitus without complications: Secondary | ICD-10-CM | POA: Diagnosis not present

## 2016-09-22 ENCOUNTER — Ambulatory Visit (INDEPENDENT_AMBULATORY_CARE_PROVIDER_SITE_OTHER): Payer: Medicare Other | Admitting: *Deleted

## 2016-09-22 DIAGNOSIS — Z5181 Encounter for therapeutic drug level monitoring: Secondary | ICD-10-CM

## 2016-09-22 DIAGNOSIS — I236 Thrombosis of atrium, auricular appendage, and ventricle as current complications following acute myocardial infarction: Secondary | ICD-10-CM

## 2016-09-22 LAB — POCT INR: INR: 2.2

## 2016-10-09 DIAGNOSIS — R0602 Shortness of breath: Secondary | ICD-10-CM | POA: Diagnosis not present

## 2016-10-09 DIAGNOSIS — I509 Heart failure, unspecified: Secondary | ICD-10-CM | POA: Diagnosis not present

## 2016-10-17 DIAGNOSIS — I252 Old myocardial infarction: Secondary | ICD-10-CM | POA: Diagnosis not present

## 2016-10-17 DIAGNOSIS — N183 Chronic kidney disease, stage 3 (moderate): Secondary | ICD-10-CM | POA: Diagnosis not present

## 2016-10-17 DIAGNOSIS — Z7901 Long term (current) use of anticoagulants: Secondary | ICD-10-CM | POA: Diagnosis not present

## 2016-10-17 DIAGNOSIS — I130 Hypertensive heart and chronic kidney disease with heart failure and stage 1 through stage 4 chronic kidney disease, or unspecified chronic kidney disease: Secondary | ICD-10-CM | POA: Diagnosis not present

## 2016-10-17 DIAGNOSIS — I255 Ischemic cardiomyopathy: Secondary | ICD-10-CM | POA: Diagnosis not present

## 2016-10-17 DIAGNOSIS — J9611 Chronic respiratory failure with hypoxia: Secondary | ICD-10-CM | POA: Diagnosis not present

## 2016-10-17 DIAGNOSIS — S72434D Nondisplaced fracture of medial condyle of right femur, subsequent encounter for closed fracture with routine healing: Secondary | ICD-10-CM | POA: Diagnosis not present

## 2016-10-17 DIAGNOSIS — I2510 Atherosclerotic heart disease of native coronary artery without angina pectoris: Secondary | ICD-10-CM | POA: Diagnosis not present

## 2016-10-17 DIAGNOSIS — Z87440 Personal history of urinary (tract) infections: Secondary | ICD-10-CM | POA: Diagnosis not present

## 2016-10-17 DIAGNOSIS — Z9981 Dependence on supplemental oxygen: Secondary | ICD-10-CM | POA: Diagnosis not present

## 2016-10-17 DIAGNOSIS — I5042 Chronic combined systolic (congestive) and diastolic (congestive) heart failure: Secondary | ICD-10-CM | POA: Diagnosis not present

## 2016-10-17 DIAGNOSIS — E785 Hyperlipidemia, unspecified: Secondary | ICD-10-CM | POA: Diagnosis not present

## 2016-10-17 DIAGNOSIS — M1610 Unilateral primary osteoarthritis, unspecified hip: Secondary | ICD-10-CM | POA: Diagnosis not present

## 2016-10-20 ENCOUNTER — Ambulatory Visit (INDEPENDENT_AMBULATORY_CARE_PROVIDER_SITE_OTHER): Payer: Medicare Other | Admitting: *Deleted

## 2016-10-20 DIAGNOSIS — Z5181 Encounter for therapeutic drug level monitoring: Secondary | ICD-10-CM

## 2016-10-20 DIAGNOSIS — I236 Thrombosis of atrium, auricular appendage, and ventricle as current complications following acute myocardial infarction: Secondary | ICD-10-CM | POA: Diagnosis not present

## 2016-10-20 LAB — POCT INR: INR: 2.3

## 2016-10-22 DIAGNOSIS — Z87440 Personal history of urinary (tract) infections: Secondary | ICD-10-CM | POA: Diagnosis not present

## 2016-10-22 DIAGNOSIS — M1610 Unilateral primary osteoarthritis, unspecified hip: Secondary | ICD-10-CM | POA: Diagnosis not present

## 2016-10-22 DIAGNOSIS — I130 Hypertensive heart and chronic kidney disease with heart failure and stage 1 through stage 4 chronic kidney disease, or unspecified chronic kidney disease: Secondary | ICD-10-CM | POA: Diagnosis not present

## 2016-10-22 DIAGNOSIS — I2510 Atherosclerotic heart disease of native coronary artery without angina pectoris: Secondary | ICD-10-CM | POA: Diagnosis not present

## 2016-10-22 DIAGNOSIS — S72434D Nondisplaced fracture of medial condyle of right femur, subsequent encounter for closed fracture with routine healing: Secondary | ICD-10-CM | POA: Diagnosis not present

## 2016-10-22 DIAGNOSIS — I5042 Chronic combined systolic (congestive) and diastolic (congestive) heart failure: Secondary | ICD-10-CM | POA: Diagnosis not present

## 2016-10-22 DIAGNOSIS — I255 Ischemic cardiomyopathy: Secondary | ICD-10-CM | POA: Diagnosis not present

## 2016-10-22 DIAGNOSIS — E785 Hyperlipidemia, unspecified: Secondary | ICD-10-CM | POA: Diagnosis not present

## 2016-10-22 DIAGNOSIS — Z7901 Long term (current) use of anticoagulants: Secondary | ICD-10-CM | POA: Diagnosis not present

## 2016-10-22 DIAGNOSIS — I252 Old myocardial infarction: Secondary | ICD-10-CM | POA: Diagnosis not present

## 2016-10-22 DIAGNOSIS — N183 Chronic kidney disease, stage 3 (moderate): Secondary | ICD-10-CM | POA: Diagnosis not present

## 2016-10-22 DIAGNOSIS — Z9981 Dependence on supplemental oxygen: Secondary | ICD-10-CM | POA: Diagnosis not present

## 2016-10-22 DIAGNOSIS — J9611 Chronic respiratory failure with hypoxia: Secondary | ICD-10-CM | POA: Diagnosis not present

## 2016-10-24 DIAGNOSIS — Z87440 Personal history of urinary (tract) infections: Secondary | ICD-10-CM | POA: Diagnosis not present

## 2016-10-24 DIAGNOSIS — I252 Old myocardial infarction: Secondary | ICD-10-CM | POA: Diagnosis not present

## 2016-10-24 DIAGNOSIS — I255 Ischemic cardiomyopathy: Secondary | ICD-10-CM | POA: Diagnosis not present

## 2016-10-24 DIAGNOSIS — I2510 Atherosclerotic heart disease of native coronary artery without angina pectoris: Secondary | ICD-10-CM | POA: Diagnosis not present

## 2016-10-24 DIAGNOSIS — Z9981 Dependence on supplemental oxygen: Secondary | ICD-10-CM | POA: Diagnosis not present

## 2016-10-24 DIAGNOSIS — I5042 Chronic combined systolic (congestive) and diastolic (congestive) heart failure: Secondary | ICD-10-CM | POA: Diagnosis not present

## 2016-10-24 DIAGNOSIS — E785 Hyperlipidemia, unspecified: Secondary | ICD-10-CM | POA: Diagnosis not present

## 2016-10-24 DIAGNOSIS — J9611 Chronic respiratory failure with hypoxia: Secondary | ICD-10-CM | POA: Diagnosis not present

## 2016-10-24 DIAGNOSIS — I130 Hypertensive heart and chronic kidney disease with heart failure and stage 1 through stage 4 chronic kidney disease, or unspecified chronic kidney disease: Secondary | ICD-10-CM | POA: Diagnosis not present

## 2016-10-24 DIAGNOSIS — Z7901 Long term (current) use of anticoagulants: Secondary | ICD-10-CM | POA: Diagnosis not present

## 2016-10-24 DIAGNOSIS — S72434D Nondisplaced fracture of medial condyle of right femur, subsequent encounter for closed fracture with routine healing: Secondary | ICD-10-CM | POA: Diagnosis not present

## 2016-10-24 DIAGNOSIS — N183 Chronic kidney disease, stage 3 (moderate): Secondary | ICD-10-CM | POA: Diagnosis not present

## 2016-10-24 DIAGNOSIS — M1610 Unilateral primary osteoarthritis, unspecified hip: Secondary | ICD-10-CM | POA: Diagnosis not present

## 2016-10-31 DIAGNOSIS — I255 Ischemic cardiomyopathy: Secondary | ICD-10-CM | POA: Diagnosis not present

## 2016-10-31 DIAGNOSIS — Z87440 Personal history of urinary (tract) infections: Secondary | ICD-10-CM | POA: Diagnosis not present

## 2016-10-31 DIAGNOSIS — I5042 Chronic combined systolic (congestive) and diastolic (congestive) heart failure: Secondary | ICD-10-CM | POA: Diagnosis not present

## 2016-10-31 DIAGNOSIS — E785 Hyperlipidemia, unspecified: Secondary | ICD-10-CM | POA: Diagnosis not present

## 2016-10-31 DIAGNOSIS — Z9981 Dependence on supplemental oxygen: Secondary | ICD-10-CM | POA: Diagnosis not present

## 2016-10-31 DIAGNOSIS — M1610 Unilateral primary osteoarthritis, unspecified hip: Secondary | ICD-10-CM | POA: Diagnosis not present

## 2016-10-31 DIAGNOSIS — I2510 Atherosclerotic heart disease of native coronary artery without angina pectoris: Secondary | ICD-10-CM | POA: Diagnosis not present

## 2016-10-31 DIAGNOSIS — J9611 Chronic respiratory failure with hypoxia: Secondary | ICD-10-CM | POA: Diagnosis not present

## 2016-10-31 DIAGNOSIS — I252 Old myocardial infarction: Secondary | ICD-10-CM | POA: Diagnosis not present

## 2016-10-31 DIAGNOSIS — S72434D Nondisplaced fracture of medial condyle of right femur, subsequent encounter for closed fracture with routine healing: Secondary | ICD-10-CM | POA: Diagnosis not present

## 2016-10-31 DIAGNOSIS — N183 Chronic kidney disease, stage 3 (moderate): Secondary | ICD-10-CM | POA: Diagnosis not present

## 2016-10-31 DIAGNOSIS — Z7901 Long term (current) use of anticoagulants: Secondary | ICD-10-CM | POA: Diagnosis not present

## 2016-10-31 DIAGNOSIS — I130 Hypertensive heart and chronic kidney disease with heart failure and stage 1 through stage 4 chronic kidney disease, or unspecified chronic kidney disease: Secondary | ICD-10-CM | POA: Diagnosis not present

## 2016-11-05 ENCOUNTER — Inpatient Hospital Stay (HOSPITAL_COMMUNITY)
Admission: EM | Admit: 2016-11-05 | Discharge: 2016-11-22 | DRG: 871 | Disposition: E | Payer: Medicare Other | Attending: Internal Medicine | Admitting: Internal Medicine

## 2016-11-05 ENCOUNTER — Emergency Department (HOSPITAL_COMMUNITY): Payer: Medicare Other

## 2016-11-05 ENCOUNTER — Encounter (HOSPITAL_COMMUNITY): Payer: Self-pay

## 2016-11-05 DIAGNOSIS — I2584 Coronary atherosclerosis due to calcified coronary lesion: Secondary | ICD-10-CM | POA: Diagnosis present

## 2016-11-05 DIAGNOSIS — Z792 Long term (current) use of antibiotics: Secondary | ICD-10-CM

## 2016-11-05 DIAGNOSIS — Z955 Presence of coronary angioplasty implant and graft: Secondary | ICD-10-CM

## 2016-11-05 DIAGNOSIS — M1610 Unilateral primary osteoarthritis, unspecified hip: Secondary | ICD-10-CM | POA: Diagnosis present

## 2016-11-05 DIAGNOSIS — I959 Hypotension, unspecified: Secondary | ICD-10-CM | POA: Diagnosis not present

## 2016-11-05 DIAGNOSIS — Z79899 Other long term (current) drug therapy: Secondary | ICD-10-CM

## 2016-11-05 DIAGNOSIS — I10 Essential (primary) hypertension: Secondary | ICD-10-CM | POA: Diagnosis not present

## 2016-11-05 DIAGNOSIS — Z885 Allergy status to narcotic agent status: Secondary | ICD-10-CM | POA: Diagnosis not present

## 2016-11-05 DIAGNOSIS — E7800 Pure hypercholesterolemia, unspecified: Secondary | ICD-10-CM | POA: Diagnosis not present

## 2016-11-05 DIAGNOSIS — Z95810 Presence of automatic (implantable) cardiac defibrillator: Secondary | ICD-10-CM | POA: Diagnosis not present

## 2016-11-05 DIAGNOSIS — R4182 Altered mental status, unspecified: Secondary | ICD-10-CM | POA: Diagnosis not present

## 2016-11-05 DIAGNOSIS — I5042 Chronic combined systolic (congestive) and diastolic (congestive) heart failure: Secondary | ICD-10-CM | POA: Diagnosis present

## 2016-11-05 DIAGNOSIS — J90 Pleural effusion, not elsewhere classified: Secondary | ICD-10-CM | POA: Diagnosis not present

## 2016-11-05 DIAGNOSIS — Z515 Encounter for palliative care: Secondary | ICD-10-CM | POA: Diagnosis not present

## 2016-11-05 DIAGNOSIS — Z9981 Dependence on supplemental oxygen: Secondary | ICD-10-CM

## 2016-11-05 DIAGNOSIS — Z7901 Long term (current) use of anticoagulants: Secondary | ICD-10-CM

## 2016-11-05 DIAGNOSIS — R8271 Bacteriuria: Secondary | ICD-10-CM | POA: Diagnosis present

## 2016-11-05 DIAGNOSIS — N184 Chronic kidney disease, stage 4 (severe): Secondary | ICD-10-CM | POA: Diagnosis not present

## 2016-11-05 DIAGNOSIS — J9621 Acute and chronic respiratory failure with hypoxia: Secondary | ICD-10-CM | POA: Diagnosis not present

## 2016-11-05 DIAGNOSIS — Z87442 Personal history of urinary calculi: Secondary | ICD-10-CM | POA: Diagnosis not present

## 2016-11-05 DIAGNOSIS — Z803 Family history of malignant neoplasm of breast: Secondary | ICD-10-CM

## 2016-11-05 DIAGNOSIS — J9611 Chronic respiratory failure with hypoxia: Secondary | ICD-10-CM | POA: Diagnosis present

## 2016-11-05 DIAGNOSIS — E785 Hyperlipidemia, unspecified: Secondary | ICD-10-CM | POA: Diagnosis present

## 2016-11-05 DIAGNOSIS — Z881 Allergy status to other antibiotic agents status: Secondary | ICD-10-CM

## 2016-11-05 DIAGNOSIS — Z66 Do not resuscitate: Secondary | ICD-10-CM | POA: Diagnosis not present

## 2016-11-05 DIAGNOSIS — I252 Old myocardial infarction: Secondary | ICD-10-CM

## 2016-11-05 DIAGNOSIS — N179 Acute kidney failure, unspecified: Secondary | ICD-10-CM | POA: Diagnosis not present

## 2016-11-05 DIAGNOSIS — M810 Age-related osteoporosis without current pathological fracture: Secondary | ICD-10-CM | POA: Diagnosis not present

## 2016-11-05 DIAGNOSIS — Z87440 Personal history of urinary (tract) infections: Secondary | ICD-10-CM

## 2016-11-05 DIAGNOSIS — J189 Pneumonia, unspecified organism: Secondary | ICD-10-CM | POA: Diagnosis not present

## 2016-11-05 DIAGNOSIS — I444 Left anterior fascicular block: Secondary | ICD-10-CM | POA: Diagnosis present

## 2016-11-05 DIAGNOSIS — A419 Sepsis, unspecified organism: Secondary | ICD-10-CM | POA: Diagnosis not present

## 2016-11-05 DIAGNOSIS — Z833 Family history of diabetes mellitus: Secondary | ICD-10-CM

## 2016-11-05 DIAGNOSIS — N189 Chronic kidney disease, unspecified: Secondary | ICD-10-CM | POA: Diagnosis present

## 2016-11-05 DIAGNOSIS — J810 Acute pulmonary edema: Secondary | ICD-10-CM | POA: Diagnosis not present

## 2016-11-05 DIAGNOSIS — R0602 Shortness of breath: Secondary | ICD-10-CM

## 2016-11-05 DIAGNOSIS — Z86718 Personal history of other venous thrombosis and embolism: Secondary | ICD-10-CM

## 2016-11-05 DIAGNOSIS — I255 Ischemic cardiomyopathy: Secondary | ICD-10-CM | POA: Diagnosis present

## 2016-11-05 DIAGNOSIS — I2510 Atherosclerotic heart disease of native coronary artery without angina pectoris: Secondary | ICD-10-CM | POA: Diagnosis not present

## 2016-11-05 DIAGNOSIS — Z90710 Acquired absence of both cervix and uterus: Secondary | ICD-10-CM

## 2016-11-05 DIAGNOSIS — I130 Hypertensive heart and chronic kidney disease with heart failure and stage 1 through stage 4 chronic kidney disease, or unspecified chronic kidney disease: Secondary | ICD-10-CM | POA: Diagnosis present

## 2016-11-05 DIAGNOSIS — N183 Chronic kidney disease, stage 3 unspecified: Secondary | ICD-10-CM | POA: Diagnosis present

## 2016-11-05 DIAGNOSIS — G9341 Metabolic encephalopathy: Secondary | ICD-10-CM | POA: Diagnosis present

## 2016-11-05 DIAGNOSIS — R05 Cough: Secondary | ICD-10-CM | POA: Diagnosis not present

## 2016-11-05 DIAGNOSIS — I700 Atherosclerosis of aorta: Secondary | ICD-10-CM | POA: Diagnosis present

## 2016-11-05 DIAGNOSIS — Z823 Family history of stroke: Secondary | ICD-10-CM

## 2016-11-05 LAB — CBC WITH DIFFERENTIAL/PLATELET
BASOS PCT: 0 %
Basophils Absolute: 0 10*3/uL (ref 0.0–0.1)
EOS ABS: 0.1 10*3/uL (ref 0.0–0.7)
EOS PCT: 1 %
HCT: 29.4 % — ABNORMAL LOW (ref 36.0–46.0)
HEMOGLOBIN: 9.4 g/dL — AB (ref 12.0–15.0)
LYMPHS ABS: 1.5 10*3/uL (ref 0.7–4.0)
Lymphocytes Relative: 14 %
MCH: 30.3 pg (ref 26.0–34.0)
MCHC: 32 g/dL (ref 30.0–36.0)
MCV: 94.8 fL (ref 78.0–100.0)
Monocytes Absolute: 0.6 10*3/uL (ref 0.1–1.0)
Monocytes Relative: 6 %
NEUTROS PCT: 79 %
Neutro Abs: 8.5 10*3/uL — ABNORMAL HIGH (ref 1.7–7.7)
PLATELETS: 127 10*3/uL — AB (ref 150–400)
RBC: 3.1 MIL/uL — AB (ref 3.87–5.11)
RDW: 13.2 % (ref 11.5–15.5)
WBC: 10.6 10*3/uL — AB (ref 4.0–10.5)

## 2016-11-05 LAB — URINALYSIS, ROUTINE W REFLEX MICROSCOPIC
Bilirubin Urine: NEGATIVE
Glucose, UA: NEGATIVE mg/dL
Hgb urine dipstick: NEGATIVE
Ketones, ur: NEGATIVE mg/dL
Nitrite: NEGATIVE
PROTEIN: NEGATIVE mg/dL
Specific Gravity, Urine: 1.006 (ref 1.005–1.030)
pH: 6 (ref 5.0–8.0)

## 2016-11-05 LAB — LACTIC ACID, PLASMA
LACTIC ACID, VENOUS: 1 mmol/L (ref 0.5–1.9)
LACTIC ACID, VENOUS: 0.9 mmol/L (ref 0.5–1.9)

## 2016-11-05 LAB — COMPREHENSIVE METABOLIC PANEL
ALK PHOS: 57 U/L (ref 38–126)
ALT: 13 U/L — AB (ref 14–54)
AST: 19 U/L (ref 15–41)
Albumin: 3.2 g/dL — ABNORMAL LOW (ref 3.5–5.0)
Anion gap: 8 (ref 5–15)
BILIRUBIN TOTAL: 0.9 mg/dL (ref 0.3–1.2)
BUN: 30 mg/dL — AB (ref 6–20)
CALCIUM: 8.2 mg/dL — AB (ref 8.9–10.3)
CO2: 34 mmol/L — ABNORMAL HIGH (ref 22–32)
CREATININE: 1.61 mg/dL — AB (ref 0.44–1.00)
Chloride: 95 mmol/L — ABNORMAL LOW (ref 101–111)
GFR, EST AFRICAN AMERICAN: 32 mL/min — AB (ref >60)
GFR, EST NON AFRICAN AMERICAN: 27 mL/min — AB (ref >60)
Glucose, Bld: 112 mg/dL — ABNORMAL HIGH (ref 65–99)
Potassium: 4.3 mmol/L (ref 3.5–5.1)
Sodium: 137 mmol/L (ref 135–145)
TOTAL PROTEIN: 5.9 g/dL — AB (ref 6.5–8.1)

## 2016-11-05 LAB — MRSA PCR SCREENING: MRSA BY PCR: NEGATIVE

## 2016-11-05 LAB — PROTIME-INR
INR: 2.1
PROTHROMBIN TIME: 23.9 s — AB (ref 11.4–15.2)

## 2016-11-05 LAB — MAGNESIUM: Magnesium: 1.9 mg/dL (ref 1.7–2.4)

## 2016-11-05 LAB — STREP PNEUMONIAE URINARY ANTIGEN: Strep Pneumo Urinary Antigen: NEGATIVE

## 2016-11-05 MED ORDER — LINAGLIPTIN 5 MG PO TABS
5.0000 mg | ORAL_TABLET | Freq: Every day | ORAL | Status: DC
  Administered 2016-11-05 – 2016-11-06 (×2): 5 mg via ORAL
  Filled 2016-11-05 (×2): qty 1

## 2016-11-05 MED ORDER — TRAMADOL HCL 50 MG PO TABS
50.0000 mg | ORAL_TABLET | Freq: Two times a day (BID) | ORAL | Status: DC | PRN
  Administered 2016-11-06: 50 mg via ORAL
  Filled 2016-11-05: qty 1

## 2016-11-05 MED ORDER — WARFARIN - PHARMACIST DOSING INPATIENT
Freq: Every day | Status: DC
  Administered 2016-11-05: 1
  Administered 2016-11-06: 16:00:00

## 2016-11-05 MED ORDER — SERTRALINE HCL 50 MG PO TABS
50.0000 mg | ORAL_TABLET | Freq: Every day | ORAL | Status: DC
  Administered 2016-11-05 – 2016-11-06 (×2): 50 mg via ORAL
  Filled 2016-11-05 (×2): qty 1

## 2016-11-05 MED ORDER — SODIUM CHLORIDE 0.9 % IV BOLUS (SEPSIS)
1000.0000 mL | Freq: Once | INTRAVENOUS | Status: AC
  Administered 2016-11-05: 1000 mL via INTRAVENOUS

## 2016-11-05 MED ORDER — SODIUM CHLORIDE 0.9 % IV BOLUS (SEPSIS)
1000.0000 mL | Freq: Once | INTRAVENOUS | Status: DC
  Administered 2016-11-05: 1000 mL via INTRAVENOUS

## 2016-11-05 MED ORDER — DEXTROSE 5 % IV SOLN
2.0000 g | Freq: Once | INTRAVENOUS | Status: AC
  Administered 2016-11-05: 2 g via INTRAVENOUS
  Filled 2016-11-05: qty 2

## 2016-11-05 MED ORDER — ATORVASTATIN CALCIUM 40 MG PO TABS
40.0000 mg | ORAL_TABLET | Freq: Every evening | ORAL | Status: DC
  Administered 2016-11-05: 40 mg via ORAL
  Filled 2016-11-05 (×2): qty 1

## 2016-11-05 MED ORDER — SODIUM CHLORIDE 0.9 % IV BOLUS (SEPSIS)
500.0000 mL | Freq: Once | INTRAVENOUS | Status: AC
  Administered 2016-11-05: 500 mL via INTRAVENOUS

## 2016-11-05 MED ORDER — ASPIRIN 81 MG PO CHEW
324.0000 mg | CHEWABLE_TABLET | Freq: Once | ORAL | Status: AC
  Administered 2016-11-05: 324 mg via ORAL
  Filled 2016-11-05: qty 4

## 2016-11-05 MED ORDER — FOLIC ACID 1 MG PO TABS
1.0000 mg | ORAL_TABLET | Freq: Every day | ORAL | Status: DC
  Administered 2016-11-05 – 2016-11-06 (×2): 1 mg via ORAL
  Filled 2016-11-05 (×3): qty 1

## 2016-11-05 MED ORDER — WARFARIN SODIUM 2.5 MG PO TABS
1.2500 mg | ORAL_TABLET | Freq: Once | ORAL | Status: AC
  Administered 2016-11-05: 1.25 mg via ORAL
  Filled 2016-11-05 (×2): qty 1

## 2016-11-05 MED ORDER — ENSURE ENLIVE PO LIQD
237.0000 mL | Freq: Two times a day (BID) | ORAL | Status: DC
  Administered 2016-11-05 – 2016-11-06 (×3): 237 mL via ORAL

## 2016-11-05 MED ORDER — PANTOPRAZOLE SODIUM 40 MG PO TBEC
40.0000 mg | DELAYED_RELEASE_TABLET | Freq: Every day | ORAL | Status: DC
  Administered 2016-11-05 – 2016-11-06 (×2): 40 mg via ORAL
  Filled 2016-11-05 (×2): qty 1

## 2016-11-05 MED ORDER — DEXTROSE 5 % IV SOLN
500.0000 mg | Freq: Once | INTRAVENOUS | Status: AC
  Administered 2016-11-05: 500 mg via INTRAVENOUS
  Filled 2016-11-05: qty 500

## 2016-11-05 MED ORDER — DEXTROSE 5 % IV SOLN
1.0000 g | INTRAVENOUS | Status: DC
  Administered 2016-11-06: 1 g via INTRAVENOUS
  Filled 2016-11-05 (×2): qty 10

## 2016-11-05 MED ORDER — DEXTROSE 5 % IV SOLN
500.0000 mg | INTRAVENOUS | Status: DC
  Administered 2016-11-06: 500 mg via INTRAVENOUS
  Filled 2016-11-05: qty 500

## 2016-11-05 NOTE — Progress Notes (Addendum)
ANTICOAGULATION CONSULT NOTE - Initial Consult  Pharmacy Consult for Coumadin Indication: History of LV mural throbus  Allergies  Allergen Reactions  . Codeine Nausea And Vomiting    Patient Measurements: Height: 5\' 4"  (162.6 cm) Weight: 144 lb (65.3 kg) IBW/kg (Calculated) : 54.7  Vital Signs: Temp: 99.6 F (37.6 C) (08/15 1324) Temp Source: Oral (08/15 1324) BP: 97/45 (08/15 1400) Pulse Rate: 63 (08/15 1400)  Labs:  Recent Labs  10/27/2016 1246  HGB 9.4*  HCT 29.4*  PLT 127*  LABPROT 23.9*  INR 2.10  CREATININE 1.61*    Estimated Creatinine Clearance: 20.5 mL/min (A) (by C-G formula based on SCr of 1.61 mg/dL (H)).   Medical History: Past Medical History:  Diagnosis Date  . Arthritis    severe lower back pain  . CAD (coronary artery disease)    anterior MI in 2006 with Cypher DES to LAD  . Cardiomyopathy   . CHF (congestive heart failure) ([place])   . CKD (chronic kidney disease) stage 3, GFR 30-59 ml/min    Stage III-stage IV  . HTN (hypertension)   . LV (left ventricular) mural thrombus    on coumadin   . MI, old   . Nephrolithiasis   . OA (osteoarthritis) of hip   . On home oxygen therapy    at night  . Osteoporosis   . Other specified forms of chronic ischemic heart disease    echo (1/10) with EF 25-30%, mildly dilated LV, periapical LV aneueysm, calcified LV thombus, moderate MR. Medtronic ICD set to VVI.   Marland Kitchen Pure hypercholesterolemia     Medications:  See med rec  Assessment: 83 yo female who presents to ED with AMS and fever.  On chronic coumadin for hx of LV mural thrombus. Patient is followed by anticoag clinic and takes 2.5mg  on Tues,THurs,Sunday, and 1.25mg   on Mon, Wed, Fri, Sat. INR is therapeutic on admission.  Goal of Therapy:  INR 2-3 Monitor platelets by anticoagulation protocol: Yes   Plan:  Coumadin 1.25mg  x 1 today Pt-INR daily Monitor for S/S of bleeding  Isac Sarna, BS [place], BCPS Clinical Pharmacist Pager  [phone] 10/27/2016,2:59 PM

## 2016-11-05 NOTE — H&P (Addendum)
[facility] History and Physical  LARRA CRUNKLETON IWP:[id number] DOB: 07/29/1925 [name]: 11/12/2016  Referring physician:   PCP: [name], MD   Chief Complaint:  Lethargy  HPI:  83 year old female with a history of Corey artery disease, cardiomyopathy, chronic respiratory failure, chronic kidney disease stage III, who presents to the ER with altered mental status, decreased urine output. Patient was found to have a rectal temperature 100.6. Most of the history is obtained from the ED chart. Per care giver had a choking spell 2 days ago , caregiver also noted decreased urine out put . Patient had an initial blood pressure of 82 x 58. Patient received fluid boluses in the ED that improved her blood pressure to systolic in the 50N. Empirically started on antibiotics due to concern for pneumonia. ED course BP (!) 97/45   Pulse 63   Temp 99.6 F (37.6 C) (Oral)   Resp 17   Ht 5\' 4"  (1.626 m)   Creatinine 1.61, glucose 112, white blood cell count 10.6, hemoglobin 9.4, blood culture 2 were drawn. Empiric antibiotics initiated in the ED. Patient admitted to step down for possible pneumonia with sepsis     Review of Systems: negative for the following   Unable to obtain due to altered mental status   Past Medical History:  Diagnosis Date  . Arthritis    severe lower back pain  . CAD (coronary artery disease)    anterior MI in 2006 with Cypher DES to LAD  . Cardiomyopathy   . CHF (congestive heart failure) ([place])   . CKD (chronic kidney disease) stage 3, GFR 30-59 ml/min    Stage III-stage IV  . HTN (hypertension)   . LV (left ventricular) mural thrombus    on coumadin   . MI, old   . Nephrolithiasis   . OA (osteoarthritis) of hip   . On home oxygen therapy    at night  . Osteoporosis   . Other specified forms of chronic ischemic heart disease    echo (1/10) with EF 25-30%, mildly dilated LV, periapical LV aneueysm, calcified LV thombus, moderate MR. Medtronic ICD set to VVI.    Marland Kitchen Pure hypercholesterolemia      Past Surgical History:  Procedure Laterality Date  . AICD implantation     ICD-Medtronic. Remote-yes   . CARDIAC CATHETERIZATION    . CORONARY ANGIOPLASTY    . CORONARY ANGIOPLASTY WITH STENT PLACEMENT    . VESICOVAGINAL FISTULA CLOSURE W/ TAH  1978      Social History:  reports that she has never smoked. She has never used smokeless tobacco. She reports that she does not drink alcohol or use drugs.    Allergies  Allergen Reactions  . Codeine Nausea And Vomiting    Family History  Problem Relation Age of Onset  . Stroke Father   . Breast cancer Mother   . Stroke Brother   . Diabetes Brother         Prior to Admission medications   Medication Sig Start Date End Date Taking? Authorizing Provider  acetaminophen (TYLENOL) 325 MG tablet Take 325-650 mg by mouth every 6 (six) hours as needed for mild pain.     [provider]  atorvastatin (LIPITOR) 40 MG tablet Take 1 Tablet by mouth every evening 06/06/16   Bensimhon, Shaune Pascal, MD  carvedilol (COREG) 25 MG tablet Take 1 Tablet by mouth 2 times a day 04/28/16   Larey Dresser, MD  cholecalciferol (VITAMIN D) 1000 UNITS tablet  Take 1,000 Units by mouth daily.      [provider]  ciprofloxacin (CIPRO) 250 MG tablet Take 1 tablet (250 mg total) by mouth 2 (two) times daily. 06/27/16   Kathie Dike, MD  folic acid (FOLVITE) 1 MG tablet Take 1 mg by mouth daily.    [provider]  furosemide (LASIX) 40 MG tablet Take 1 tablet (40 mg total) by mouth 2 (two) times daily. 05/20/16   Larey Dresser, MD  gabapentin (NEURONTIN) 100 MG capsule 1 capsule 3 (three) times daily. 01/14/16   [provider]  isosorbide mononitrate (IMDUR) 60 MG 24 hr tablet Take 1 tablet (60 mg total) by mouth daily. 05/15/16   Darrick Grinder D, NP  losartan (COZAAR) 50 MG tablet Take 1 Tablet by mouth once daily 01/07/16   Shirley Friar, PA-C  Multiple Vitamins-Minerals (ICAPS  AREDS FORMULA PO) Take 2 capsules by mouth daily.     [provider]  nitrofurantoin, macrocrystal-monohydrate, (MACROBID) 100 MG capsule Take 1 capsule (100 mg total) by mouth 2 (two) times daily. 06/27/16   Kathie Dike, MD  nitroGLYCERIN (NITROSTAT) 0.4 MG SL tablet Place 1 tablet (0.4 mg total) under the tongue every 5 (five) minutes as needed for chest pain. 02/12/15   Evans Lance, MD  pantoprazole (PROTONIX) 40 MG tablet Take 1 Tablet by mouth once daily 01/15/16   Larey Dresser, MD  Phenazopyridine HCl (AZO-STANDARD PO) Take 2 tablets by mouth daily as needed.    [provider]  polyethylene glycol (MIRALAX / GLYCOLAX) packet Take 17 g by mouth daily. 04/28/15   Domenic Polite, MD  saxagliptin HCl (ONGLYZA) 2.5 MG TABS tablet Take 2.5 mg by mouth daily.    [provider]  sertraline (ZOLOFT) 50 MG tablet Take 50 mg by mouth daily.     [provider]  spironolactone (ALDACTONE) 25 MG tablet Take 1 Tablet by mouth once daily 04/28/16   Larey Dresser, MD  traMADol (ULTRAM) 50 MG tablet Take 1 tablet (50 mg total) by mouth every 6 (six) hours as needed for moderate pain. 09/12/15   Julianne Rice, MD  warfarin (COUMADIN) 2.5 MG tablet take ONE tablet daily OR as instructed by coumadin clinic 07/07/16   Evans Lance, MD     Physical Exam: Vitals:   11/04/2016 1324 11/19/2016 1330 10/24/2016 1345 10/29/2016 1400  BP: 96/60 (!) 91/57 108/70 (!) 97/45  Pulse: 67 66 62 63  Resp: 18 17 19 17   Temp: 99.6 F (37.6 C)     TempSrc: Oral     SpO2: 99% 97% 98% 98%  Weight:      Height:            Vitals:   11/14/2016 1324 11/01/2016 1330 10/27/2016 1345 11/20/2016 1400  BP: 96/60 (!) 91/57 108/70 (!) 97/45  Pulse: 67 66 62 63  Resp: 18 17 19 17   Temp: 99.6 F (37.6 C)     TempSrc: Oral     SpO2: 99% 97% 98% 98%  Weight:      Height:       Constitutional: Somewhat confused oriented to self and place, Eyes: PERRL, lids and conjunctivae normal ENMT:  Mucous membranes are moist. Posterior pharynx clear of any exudate or lesions.Normal dentition.  Neck: normal, supple, no masses, no thyromegaly Respiratory: clear to auscultation bilaterally, no wheezing, no crackles. Normal respiratory effort. No accessory muscle use.  Cardiovascular: Regular rate and rhythm, no murmurs / rubs / gallops.  No extremity edema. 2+ pedal pulses. No carotid bruits.  Abdomen: no tenderness, no masses palpated. No hepatosplenomegaly. Bowel sounds positive.  Musculoskeletal: no clubbing / cyanosis. No joint deformity upper and lower extremities. Good ROM, no contractures. Normal muscle tone.  Skin: no rashes, lesions, ulcers. No induration Neurologic: CN 2-12 grossly intact. Sensation intact, DTR normal. Strength 5/5 in all 4.  Psychiatric: Normal judgment and insight. Slightly impaired judgment and mood     Labs on Admission: I have personally reviewed following labs and imaging studies  CBC:  Recent Labs Lab 11/13/2016 1246  WBC 10.6*  NEUTROABS 8.5*  HGB 9.4*  HCT 29.4*  MCV 94.8  PLT 127*    Basic Metabolic Panel:  Recent Labs Lab 10/23/2016 1246  NA 137  K 4.3  CL 95*  CO2 34*  GLUCOSE 112*  BUN 30*  CREATININE 1.61*  CALCIUM 8.2*    GFR: Estimated Creatinine Clearance: 20.5 mL/min (A) (by C-G formula based on SCr of 1.61 mg/dL (H)).  Liver Function Tests:  Recent Labs Lab 11/01/2016 1246  AST 19  ALT 13*  ALKPHOS 57  BILITOT 0.9  PROT 5.9*  ALBUMIN 3.2*   No results for input(s): LIPASE, AMYLASE in the last 168 hours. No results for input(s): AMMONIA in the last 168 hours.  Coagulation Profile:  Recent Labs Lab 11/20/2016 1246  INR 2.10   No results for input(s): DDIMER in the last 72 hours.  Cardiac Enzymes: No results for input(s): CKTOTAL, CKMB, CKMBINDEX, TROPONINI in the last 168 hours.  BNP (last 3 results) No results for input(s): PROBNP in the last 8760 hours.  HbA1C: No results for input(s): HGBA1C in the  last 72 hours. Lab Results  Component Value Date   HGBA1C 6.3 (H) 06/25/2016   HGBA1C 6.0 (H) 02/07/2016   HGBA1C (H) 03/17/2009    6.4 (NOTE) The ADA recommends the following therapeutic goal for glycemic control related to Hgb A1c measurement: Goal of therapy: <6.5 Hgb A1c  Reference: American Diabetes Association: Clinical Practice Recommendations 2010, Diabetes Care, 2010, 33: (Suppl  1).     CBG: No results for input(s): GLUCAP in the last 168 hours.  Lipid Profile: No results for input(s): CHOL, HDL, LDLCALC, TRIG, CHOLHDL, LDLDIRECT in the last 72 hours.  Thyroid Function Tests: No results for input(s): TSH, T4TOTAL, FREET4, T3FREE, THYROIDAB in the last 72 hours.  Anemia Panel: No results for input(s): VITAMINB12, FOLATE, FERRITIN, TIBC, IRON, RETICCTPCT in the last 72 hours.  Urine analysis:    Component Value Date/Time   COLORURINE YELLOW 11/04/2016 1231   APPEARANCEUR HAZY (A) 11/06/2016 1231   LABSPEC 1.006 11/12/2016 1231   PHURINE 6.0 11/11/2016 1231   GLUCOSEU NEGATIVE 10/31/2016 1231   HGBUR NEGATIVE 11/16/2016 1231   BILIRUBINUR NEGATIVE 11/14/2016 1231   KETONESUR NEGATIVE 10/26/2016 1231   PROTEINUR NEGATIVE 11/10/2016 1231   UROBILINOGEN 4.0 (H) 08/28/2014 1119   NITRITE NEGATIVE 11/14/2016 1231   LEUKOCYTESUR TRACE (A) 10/26/2016 1231    Sepsis Labs: @LABRCNTIP (procalcitonin:4,lacticidven:4) ) Recent Results (from the past 240 hour(s))  Culture, blood (Routine x 2)     Status: None (Preliminary result)   Collection Time: 11/15/2016 12:46 PM  Result Value Ref Range Status   Specimen Description RIGHT ANTECUBITAL  Final   Special Requests   Final    BOTTLES DRAWN AEROBIC AND ANAEROBIC Blood Culture adequate volume   Culture PENDING  Incomplete   Report Status PENDING  Incomplete  Culture, blood (Routine x 2)  Status: None (Preliminary result)   Collection Time: 11/02/2016 12:49 PM  Result Value Ref Range Status   Specimen Description BLOOD  LEFT HAND  Final   Special Requests   Final    BOTTLES DRAWN AEROBIC AND ANAEROBIC Blood Culture adequate volume   Culture PENDING  Incomplete   Report Status PENDING  Incomplete         Radiological Exams on Admission: Dg Chest 2 View  Result Date: 10/25/2016 CLINICAL DATA:  Possible sepsis. EXAM: CHEST  2 VIEW COMPARISON:  Radiographs of July 07, 2016. FINDINGS: Stable cardiomegaly. Stable left ventricular calcifications. Atherosclerosis of thoracic aorta is noted. Stable position of left-sided pacemaker. No pneumothorax is noted. Hypoinflation of the lungs is noted with mild bibasilar subsegmental atelectasis. Stable elevated right hemidiaphragm. Bony thorax is unremarkable. Interval development of mild right pleural effusion. IMPRESSION: Aortic atherosclerosis. Hypoinflation of the lungs with mild bibasilar subsegmental atelectasis. Interval development of mild right pleural effusion. Electronically Signed   By: Marijo Conception, M.D.   On: 10/25/2016 13:50   Dg Chest 2 View  Result Date: 11/18/2016 CLINICAL DATA:  Possible sepsis. EXAM: CHEST  2 VIEW COMPARISON:  Radiographs of July 07, 2016. FINDINGS: Stable cardiomegaly. Stable left ventricular calcifications. Atherosclerosis of thoracic aorta is noted. Stable position of left-sided pacemaker. No pneumothorax is noted. Hypoinflation of the lungs is noted with mild bibasilar subsegmental atelectasis. Stable elevated right hemidiaphragm. Bony thorax is unremarkable. Interval development of mild right pleural effusion. IMPRESSION: Aortic atherosclerosis. Hypoinflation of the lungs with mild bibasilar subsegmental atelectasis. Interval development of mild right pleural effusion. Electronically Signed   By: Marijo Conception, M.D.   On: 10/29/2016 13:50      EKG: Independently reviewed. Sinus rhythm LAD, consider left anterior fascicular block LVH with secondary repolarization abnormality   Assessment/Plan Sepsis probably secondary to  underlying pneumonia Admit to step down Pneumonia order set Empiric antibiotics, namely Rocephin and azithromycin Follow blood culture 2 Initial lactic acid 1.0 White blood cell count 10.6, will follow Could be secondary to underlying aspiration  Acute metabolic encephalopathy Likely secondary to underlying infection  History of LV thrombus on Coumadin    Hypertension-hold all home medications for now until blood pressure has stabilized  Chronic combined CHF-most recent EF 25-30% on 2-D echo in 2016, appears compensated    CKD (chronic kidney disease) stage 3, GFR 30-59 ml/min-appears to be at baseline       Chronic respiratory failure with hypoxia (HCC)-home oxygen dependent, on 2 L    DVT prophylaxis: * Coumadin     Code Status Orders Full code        consults called:  Family Communication: Admission, patients condition and plan of care including tests being ordered have been discussed with the patient  who indicates understanding and agree with the plan and Code Status  Admission status: inpatient    Disposition plan: Further plan will depend as patient's clinical course evolves and further radiologic and laboratory data become available. Likely home when stable   At the time of admission, it appears that the appropriate admission status for this patient is INPATIENT .Thisis judged to be reasonable and necessary in order to provide the required intensity of service to ensure the patient's safetygiven thepresenting symptoms, physical exam findings, and initial radiographic and laboratory data in the context of their chronic comorbidities.   Reyne Dumas MD [facility] Pager [phone]  If 7PM-7AM, please contact night-coverage [email or web address] Password [facility]  11/15/2016, 2:53 PM

## 2016-11-05 NOTE — ED Triage Notes (Addendum)
Pt brought by EMS due to altered mental status, low urine output. Pt had temp of 100.6. EMS reports pt slow to respond at time. BP initially  82/58. Pt on chronic O2. Pt has 24 hr sitter. Last BP 144/47. Received 500 cc of fluids by EMS

## 2016-11-05 NOTE — ED Notes (Signed)
Taken to xray.

## 2016-11-05 NOTE — ED Provider Notes (Signed)
[facility] DEPT Provider Note   CSN: [id number] Arrival date & time: 11/04/2016  1212     History   Chief Complaint Chief Complaint  Patient presents with  . Altered Mental Status    HPI [name] is a 83 y.o. female.  Pt presents to the ED today with fever and altered mental status.  The pt's initial BP was 82/58.  EMS gave pt 500 cc NS en route and bp is better.  Pt has a hx of frequent UTIs.  Family gave pt tylenol this morning around 8 or 9.  Pt denies any pain.  Pt on coumadin b/c of hx of LV mural thrombus.      Past Medical History:  Diagnosis Date  . Arthritis    severe lower back pain  . CAD (coronary artery disease)    anterior MI in 2006 with Cypher DES to LAD  . Cardiomyopathy   . CHF (congestive heart failure) ([place])   . CKD (chronic kidney disease) stage 3, GFR 30-59 ml/min    Stage III-stage IV  . HTN (hypertension)   . LV (left ventricular) mural thrombus    on coumadin   . MI, old   . Nephrolithiasis   . OA (osteoarthritis) of hip   . On home oxygen therapy    at night  . Osteoporosis   . Other specified forms of chronic ischemic heart disease    echo (1/10) with EF 25-30%, mildly dilated LV, periapical LV aneueysm, calcified LV thombus, moderate MR. Medtronic ICD set to VVI.   Marland Kitchen Pure hypercholesterolemia     Patient Active Problem List   Diagnosis Date Noted  . Sepsis ([place]) 10/29/2016  . Chronic combined systolic and diastolic CHF (congestive heart failure) ([place]) 06/26/2016  . Chronic respiratory failure with hypoxia ([place]) 06/26/2016  . UTI (urinary tract infection) 06/25/2016  . Weakness generalized 02/06/2016  . CAP (community acquired pneumonia) 02/06/2016  . CKD (chronic kidney disease) stage 3, GFR 30-59 ml/min 04/26/2015  . Urinary tract infection 04/25/2015  . Cardiomyopathy, ischemic 04/25/2015  . Fecal impaction ([place]) 04/25/2015  . Degenerative joint disease 04/25/2015  . Chest pain, precordial 08/28/2014  . Encounter for  therapeutic drug monitoring 05/02/2013  . CKD (chronic kidney disease) 10/22/2011  . Angina at rest [facility]) 10/08/2011  . Lower urinary tract infectious disease 10/08/2011  . Ventricular tachycardia ([place]) 06/10/2011  . Systolic CHF, chronic ([place]) 12/11/2010  . LV (left ventricular) mural thrombus 12/11/2010  . Long term current use of anticoagulant therapy 06/25/2010  . ICD-Medtronic 05/01/2009  . HYPERCHOLESTEROLEMIA 06/19/2008  . Hyperlipidemia 06/19/2008  . HTN (hypertension) 06/19/2008  . Coronary artery disease due to calcified coronary lesion 06/19/2008  . Aneurysm of heart wall 06/19/2008  . UNSPECIFIED SYSTOLIC HEART FAILURE 97/98/9211  . ARTHRITIS 06/19/2008  . SHORTNESS OF BREATH 06/19/2008    Past Surgical History:  Procedure Laterality Date  . AICD implantation     ICD-Medtronic. Remote-yes   . CARDIAC CATHETERIZATION    . CORONARY ANGIOPLASTY    . CORONARY ANGIOPLASTY WITH STENT PLACEMENT    . VESICOVAGINAL FISTULA CLOSURE W/ TAH  1978    OB History    No data available       Home Medications    Prior to Admission medications   Medication Sig Start Date End Date Taking? Authorizing Provider  acetaminophen (TYLENOL) 325 MG tablet Take 325-650 mg by mouth every 6 (six) hours as needed for mild pain.     [provider]  atorvastatin (LIPITOR) 40 MG tablet Take 1 Tablet by mouth every evening 06/06/16   Bensimhon, Shaune Pascal, MD  carvedilol (COREG) 25 MG tablet Take 1 Tablet by mouth 2 times a day 04/28/16   Larey Dresser, MD  cholecalciferol (VITAMIN D) 1000 UNITS tablet Take 1,000 Units by mouth daily.      [provider]  ciprofloxacin (CIPRO) 250 MG tablet Take 1 tablet (250 mg total) by mouth 2 (two) times daily. 06/27/16   Kathie Dike, MD  folic acid (FOLVITE) 1 MG tablet Take 1 mg by mouth daily.    [provider]  furosemide (LASIX) 40 MG tablet Take 1 tablet (40 mg total) by mouth 2 (two) times daily. 05/20/16   Larey Dresser, MD  gabapentin (NEURONTIN) 100 MG capsule 1 capsule 3 (three) times daily. 01/14/16   [provider]  isosorbide mononitrate (IMDUR) 60 MG 24 hr tablet Take 1 tablet (60 mg total) by mouth daily. 05/15/16   Darrick Grinder D, NP  losartan (COZAAR) 50 MG tablet Take 1 Tablet by mouth once daily 01/07/16   Shirley Friar, PA-C  Multiple Vitamins-Minerals (ICAPS AREDS FORMULA PO) Take 2 capsules by mouth daily.     [provider]  nitrofurantoin, macrocrystal-monohydrate, (MACROBID) 100 MG capsule Take 1 capsule (100 mg total) by mouth 2 (two) times daily. 06/27/16   Kathie Dike, MD  nitroGLYCERIN (NITROSTAT) 0.4 MG SL tablet Place 1 tablet (0.4 mg total) under the tongue every 5 (five) minutes as needed for chest pain. 02/12/15   Evans Lance, MD  pantoprazole (PROTONIX) 40 MG tablet Take 1 Tablet by mouth once daily 01/15/16   Larey Dresser, MD  Phenazopyridine HCl (AZO-STANDARD PO) Take 2 tablets by mouth daily as needed.    [provider]  polyethylene glycol (MIRALAX / GLYCOLAX) packet Take 17 g by mouth daily. 04/28/15   Domenic Polite, MD  saxagliptin HCl (ONGLYZA) 2.5 MG TABS tablet Take 2.5 mg by mouth daily.    [provider]  sertraline (ZOLOFT) 50 MG tablet Take 50 mg by mouth daily.     [provider]  spironolactone (ALDACTONE) 25 MG tablet Take 1 Tablet by mouth once daily 04/28/16   Larey Dresser, MD  traMADol (ULTRAM) 50 MG tablet Take 1 tablet (50 mg total) by mouth every 6 (six) hours as needed for moderate pain. 09/12/15   Julianne Rice, MD  warfarin (COUMADIN) 2.5 MG tablet take ONE tablet daily OR as instructed by coumadin clinic 07/07/16   Evans Lance, MD    Family History Family History  Problem Relation Age of Onset  . Stroke Father   . Breast cancer Mother   . Stroke Brother   . Diabetes Brother     Social History Social History  Substance Use Topics  . Smoking status: Never Smoker  . Smokeless  tobacco: Never Used  . Alcohol use No     Allergies   Codeine   Review of Systems Review of Systems  Constitutional: Positive for fever.  Neurological: Positive for weakness.  All other systems reviewed and are negative.    Physical Exam Updated Vital Signs BP (!) 97/45   Pulse 63   Temp 99.6 F (37.6 C) (Oral)   Resp 17   Ht 5\' 4"  (1.626 m)   Wt 65.3 kg (144 lb)   SpO2 98%   BMI 24.72 kg/m   Physical Exam  Constitutional: She is oriented to person, place, and  time. She appears well-developed and well-nourished.  HENT:  Head: Normocephalic and atraumatic.  Right Ear: External ear normal.  Left Ear: External ear normal.  Nose: Nose normal.  Mouth/Throat: Oropharynx is clear and moist.  Eyes: Pupils are equal, round, and reactive to light. Conjunctivae and EOM are normal.  Neck: Normal range of motion. Neck supple.  Cardiovascular: Normal rate, regular rhythm, normal heart sounds and intact distal pulses.   Pulmonary/Chest: Effort normal and breath sounds normal.  Abdominal: Soft. Bowel sounds are normal.  Musculoskeletal: Normal range of motion.  Neurological: She is alert and oriented to person, place, and time.  Skin: Skin is warm.  Psychiatric: She has a normal mood and affect. Her behavior is normal. Judgment and thought content normal.  Nursing note and vitals reviewed.    ED Treatments / Results  Labs (all labs ordered are listed, but only abnormal results are displayed) Labs Reviewed  COMPREHENSIVE METABOLIC PANEL - Abnormal; Notable for the following:       Result Value   Chloride 95 (*)    CO2 34 (*)    Glucose, Bld 112 (*)    BUN 30 (*)    Creatinine, Ser 1.61 (*)    Calcium 8.2 (*)    Total Protein 5.9 (*)    Albumin 3.2 (*)    ALT 13 (*)    GFR calc non Af Amer 27 (*)    GFR calc Af Amer 32 (*)    All other components within normal limits  CBC WITH DIFFERENTIAL/PLATELET - Abnormal; Notable for the following:    WBC 10.6 (*)    RBC 3.10  (*)    Hemoglobin 9.4 (*)    HCT 29.4 (*)    Platelets 127 (*)    Neutro Abs 8.5 (*)    All other components within normal limits  PROTIME-INR - Abnormal; Notable for the following:    Prothrombin Time 23.9 (*)    All other components within normal limits  URINALYSIS, ROUTINE W REFLEX MICROSCOPIC - Abnormal; Notable for the following:    APPearance HAZY (*)    Leukocytes, UA TRACE (*)    Bacteria, UA RARE (*)    Squamous Epithelial / LPF 0-5 (*)    All other components within normal limits  CULTURE, BLOOD (ROUTINE X 2)  CULTURE, BLOOD (ROUTINE X 2)  URINE CULTURE  CULTURE, BLOOD (ROUTINE X 2)  CULTURE, BLOOD (ROUTINE X 2)  CULTURE, EXPECTORATED SPUTUM-ASSESSMENT  GRAM STAIN  RESPIRATORY PANEL BY PCR  LACTIC ACID, PLASMA  LACTIC ACID, PLASMA  LACTIC ACID, PLASMA  HIV ANTIBODY (ROUTINE TESTING)  STREP PNEUMONIAE URINARY ANTIGEN  MAGNESIUM    EKG  EKG Interpretation  Date/Time:  Wednesday November 05 2016 12:35:03 EDT Ventricular Rate:  70 PR Interval:    QRS Duration: 117 QT Interval:  443 QTC Calculation: 478 R Axis:   -53 Text Interpretation:  Sinus rhythm LAD, consider left anterior fascicular block LVH with secondary repolarization abnormality Probable anterior infarct, age indeterminate No significant change since last tracing Confirmed by Isla Pence [phone]) on 11/21/2016 1:23:58 PM       Radiology Dg Chest 2 View  Result Date: 10/26/2016 CLINICAL DATA:  Possible sepsis. EXAM: CHEST  2 VIEW COMPARISON:  Radiographs of July 07, 2016. FINDINGS: Stable cardiomegaly. Stable left ventricular calcifications. Atherosclerosis of thoracic aorta is noted. Stable position of left-sided pacemaker. No pneumothorax is noted. Hypoinflation of the lungs is noted with mild bibasilar subsegmental atelectasis. Stable elevated right hemidiaphragm. Bony thorax  is unremarkable. Interval development of mild right pleural effusion. IMPRESSION: Aortic atherosclerosis. Hypoinflation of  the lungs with mild bibasilar subsegmental atelectasis. Interval development of mild right pleural effusion. Electronically Signed   By: Marijo Conception, M.D.   On: 11/06/2016 13:50    Procedures Procedures (including critical care time)  Medications Ordered in ED Medications  sodium chloride 0.9 % bolus 1,000 mL (1,000 mLs Intravenous New Bag/Given 10/29/2016 1441)  azithromycin (ZITHROMAX) 500 mg in dextrose 5 % 250 mL IVPB (not administered)  sodium chloride 0.9 % bolus 500 mL (not administered)  cefTRIAXone (ROCEPHIN) 1 g in dextrose 5 % 50 mL IVPB (not administered)  azithromycin (ZITHROMAX) 500 mg in dextrose 5 % 250 mL IVPB (not administered)  cefTRIAXone (ROCEPHIN) 2 g in dextrose 5 % 50 mL IVPB (0 g Intravenous Stopped 10/23/2016 1407)  aspirin chewable tablet 324 mg (324 mg Oral Given 11/17/2016 1314)     Initial Impression / Assessment and Plan / ED Course  I have reviewed the triage vital signs and the nursing notes.  Pertinent labs & imaging results that were available during my care of the patient were reviewed by me and considered in my medical decision making (see chart for details).    This patient meets SIRS Criteria and may be septic. SIRS = Systemic Inflammatory Response Syndrome  Best Practice Recommends:   Notify the nurse immediately to increase monitoring of patient.   The recent clinical data is shown below. Vitals:   11/09/2016 1324 11/14/2016 1330 11/02/2016 1345 11/08/2016 1400  BP: 96/60 (!) 91/57 108/70 (!) 97/45  Pulse: 67 66 62 63  Resp: 18 17 19 17   Temp: 99.6 F (37.6 C)     TempSrc: Oral     SpO2: 99% 97% 98% 98%  Weight:      Height:       IVFs did help bp.  She was given an additional liter of ns (total 3l) for her low bp.  I initially thought pt would have an uti due to multiple frequent utis, but urine today looks pretty good.  Urine sent for culture.  CXR shows small pleural effusion.  ? Cap?  Pt given zithromax also.  Blood cultures also pending.   Pt d/w Dr. Allyson Sabal (triad) for admission. Final Clinical Impressions(s) / ED Diagnoses   Final diagnoses:  Altered mental status, unspecified altered mental status type  Sepsis, due to unspecified organism [facility])  AKI (acute kidney injury) ([place])    New Prescriptions New Prescriptions   No medications on file     Isla Pence, MD 11/18/2016 1454

## 2016-11-05 NOTE — Care Management (Signed)
CM contacted by [facility] rep. Pt is active with their [facility] pta.

## 2016-11-06 ENCOUNTER — Inpatient Hospital Stay (HOSPITAL_COMMUNITY): Payer: Medicare Other

## 2016-11-06 DIAGNOSIS — N184 Chronic kidney disease, stage 4 (severe): Secondary | ICD-10-CM

## 2016-11-06 DIAGNOSIS — I10 Essential (primary) hypertension: Secondary | ICD-10-CM

## 2016-11-06 LAB — COMPREHENSIVE METABOLIC PANEL
ALK PHOS: 57 U/L (ref 38–126)
ALT: 14 U/L (ref 14–54)
AST: 17 U/L (ref 15–41)
Albumin: 3.3 g/dL — ABNORMAL LOW (ref 3.5–5.0)
Anion gap: 9 (ref 5–15)
BILIRUBIN TOTAL: 0.7 mg/dL (ref 0.3–1.2)
BUN: 30 mg/dL — AB (ref 6–20)
CALCIUM: 8.4 mg/dL — AB (ref 8.9–10.3)
CHLORIDE: 99 mmol/L — AB (ref 101–111)
CO2: 32 mmol/L (ref 22–32)
CREATININE: 1.41 mg/dL — AB (ref 0.44–1.00)
GFR, EST AFRICAN AMERICAN: 37 mL/min — AB (ref >60)
GFR, EST NON AFRICAN AMERICAN: 32 mL/min — AB (ref >60)
Glucose, Bld: 91 mg/dL (ref 65–99)
Potassium: 4 mmol/L (ref 3.5–5.1)
Sodium: 140 mmol/L (ref 135–145)
Total Protein: 6.3 g/dL — ABNORMAL LOW (ref 6.5–8.1)

## 2016-11-06 LAB — HIV ANTIBODY (ROUTINE TESTING W REFLEX): HIV Screen 4th Generation wRfx: NONREACTIVE

## 2016-11-06 LAB — BLOOD GAS, ARTERIAL
DRAWN BY: 270161
FIO2: 100
O2 CONTENT: 15 L/min
O2 SAT: 90.6 %
PO2 ART: 88.7 mmHg (ref 83.0–108.0)
Patient temperature: 37.3
pH, Arterial: 7.035 — CL (ref 7.350–7.450)

## 2016-11-06 LAB — RESPIRATORY PANEL BY PCR
Adenovirus: NOT DETECTED
Bordetella pertussis: NOT DETECTED
CORONAVIRUS HKU1-RVPPCR: NOT DETECTED
CORONAVIRUS NL63-RVPPCR: NOT DETECTED
CORONAVIRUS OC43-RVPPCR: NOT DETECTED
Chlamydophila pneumoniae: NOT DETECTED
Coronavirus 229E: NOT DETECTED
INFLUENZA A-RVPPCR: NOT DETECTED
INFLUENZA B-RVPPCR: NOT DETECTED
Influenza A H1 2009: NOT DETECTED
Influenza A H1: NOT DETECTED
Influenza A H3: NOT DETECTED
Metapneumovirus: NOT DETECTED
Mycoplasma pneumoniae: NOT DETECTED
PARAINFLUENZA VIRUS 1-RVPPCR: NOT DETECTED
PARAINFLUENZA VIRUS 2-RVPPCR: NOT DETECTED
PARAINFLUENZA VIRUS 3-RVPPCR: NOT DETECTED
PARAINFLUENZA VIRUS 4-RVPPCR: NOT DETECTED
RHINOVIRUS / ENTEROVIRUS - RVPPCR: NOT DETECTED
Respiratory Syncytial Virus: NOT DETECTED

## 2016-11-06 LAB — PROTIME-INR
INR: 2.32
PROTHROMBIN TIME: 25.9 s — AB (ref 11.4–15.2)

## 2016-11-06 LAB — CBC
HCT: 30.3 % — ABNORMAL LOW (ref 36.0–46.0)
Hemoglobin: 9.5 g/dL — ABNORMAL LOW (ref 12.0–15.0)
MCH: 29.9 pg (ref 26.0–34.0)
MCHC: 31.4 g/dL (ref 30.0–36.0)
MCV: 95.3 fL (ref 78.0–100.0)
PLATELETS: 126 10*3/uL — AB (ref 150–400)
RBC: 3.18 MIL/uL — AB (ref 3.87–5.11)
RDW: 13 % (ref 11.5–15.5)
WBC: 11 10*3/uL — AB (ref 4.0–10.5)

## 2016-11-06 MED ORDER — MORPHINE SULFATE (PF) 2 MG/ML IV SOLN
2.0000 mg | Freq: Once | INTRAVENOUS | Status: AC
  Administered 2016-11-06: 2 mg via INTRAVENOUS

## 2016-11-06 MED ORDER — MORPHINE SULFATE (PF) 2 MG/ML IV SOLN
INTRAVENOUS | Status: AC
  Administered 2016-11-06: 2 mg via INTRAVENOUS
  Filled 2016-11-06: qty 1

## 2016-11-06 MED ORDER — HALOPERIDOL LACTATE 2 MG/ML PO CONC
0.5000 mg | ORAL | Status: DC | PRN
  Filled 2016-11-06: qty 0.3

## 2016-11-06 MED ORDER — FUROSEMIDE 10 MG/ML IJ SOLN: 40.0000 mg | Freq: Once | INTRAMUSCULAR | Status: AC

## 2016-11-06 MED ORDER — POLYVINYL ALCOHOL 1.4 % OP SOLN: 1.0000 [drp] | Freq: Four times a day (QID) | OPHTHALMIC | Status: DC | PRN

## 2016-11-06 MED ORDER — GLYCOPYRROLATE 0.2 MG/ML IJ SOLN
0.2000 mg | INTRAMUSCULAR | Status: DC | PRN
  Filled 2016-11-06: qty 1

## 2016-11-06 MED ORDER — WARFARIN SODIUM 2.5 MG PO TABS
2.5000 mg | ORAL_TABLET | Freq: Once | ORAL | Status: AC
  Administered 2016-11-06: 2.5 mg via ORAL
  Filled 2016-11-06: qty 1

## 2016-11-06 MED ORDER — MORPHINE BOLUS VIA INFUSION
1.0000 mg | INTRAVENOUS | Status: DC | PRN
  Filled 2016-11-06: qty 1

## 2016-11-06 MED ORDER — ACETAMINOPHEN 325 MG PO TABS
650.0000 mg | ORAL_TABLET | Freq: Four times a day (QID) | ORAL | Status: DC | PRN
Start: 2016-11-06 — End: 2016-11-07

## 2016-11-06 MED ORDER — SODIUM CHLORIDE 0.9 % IV SOLN
5.0000 mg/h | INTRAVENOUS | Status: DC
  Administered 2016-11-06: 5 mg/h via INTRAVENOUS
  Filled 2016-11-06: qty 10

## 2016-11-06 MED ORDER — FUROSEMIDE 10 MG/ML IJ SOLN
40.0000 mg | Freq: Once | INTRAMUSCULAR | Status: AC
  Administered 2016-11-06: 40 mg via INTRAVENOUS
  Filled 2016-11-06: qty 4

## 2016-11-06 MED ORDER — FUROSEMIDE 10 MG/ML IJ SOLN
20.0000 mg | Freq: Once | INTRAMUSCULAR | Status: AC
  Administered 2016-11-06: 20 mg via INTRAVENOUS

## 2016-11-06 MED ORDER — FUROSEMIDE 10 MG/ML IJ SOLN
40.0000 mg | Freq: Once | INTRAMUSCULAR | Status: AC
  Administered 2016-11-06: 40 mg via INTRAVENOUS

## 2016-11-06 MED ORDER — BIOTENE DRY MOUTH MT LIQD: 15.0000 mL | OROMUCOSAL | Status: DC | PRN

## 2016-11-06 MED ORDER — CARVEDILOL 3.125 MG PO TABS
6.2500 mg | ORAL_TABLET | Freq: Two times a day (BID) | ORAL | Status: DC
  Administered 2016-11-06: 6.25 mg via ORAL
  Filled 2016-11-06 (×3): qty 2

## 2016-11-06 MED ORDER — MORPHINE SULFATE 25 MG/ML IV SOLN
INTRAVENOUS | Status: AC
  Filled 2016-11-06: qty 10

## 2016-11-06 MED ORDER — GLYCOPYRROLATE 1 MG PO TABS: 1.0000 mg | ORAL_TABLET | ORAL | Status: DC | PRN

## 2016-11-06 MED ORDER — ONDANSETRON 4 MG PO TBDP: 4.0000 mg | ORAL_TABLET | Freq: Four times a day (QID) | ORAL | Status: DC | PRN

## 2016-11-06 MED ORDER — ONDANSETRON HCL 4 MG/2ML IJ SOLN: 4.0000 mg | Freq: Four times a day (QID) | INTRAMUSCULAR | Status: DC | PRN

## 2016-11-06 MED ORDER — HALOPERIDOL 0.5 MG PO TABS: 0.5000 mg | ORAL_TABLET | ORAL | Status: DC | PRN

## 2016-11-06 MED ORDER — HALOPERIDOL LACTATE 5 MG/ML IJ SOLN: 0.5000 mg | INTRAMUSCULAR | Status: DC | PRN

## 2016-11-06 MED ORDER — ONDANSETRON HCL 4 MG/2ML IJ SOLN
4.0000 mg | Freq: Four times a day (QID) | INTRAMUSCULAR | Status: DC | PRN
  Administered 2016-11-06: 4 mg via INTRAVENOUS
  Filled 2016-11-06: qty 2

## 2016-11-06 MED ORDER — FUROSEMIDE 40 MG PO TABS
40.0000 mg | ORAL_TABLET | Freq: Every day | ORAL | Status: DC
  Administered 2016-11-06: 40 mg via ORAL
  Filled 2016-11-06: qty 1

## 2016-11-06 MED ORDER — LEVALBUTEROL HCL 1.25 MG/0.5ML IN NEBU: 1.2500 mg | INHALATION_SOLUTION | Freq: Four times a day (QID) | RESPIRATORY_TRACT | Status: DC | PRN

## 2016-11-06 MED ORDER — ACETAMINOPHEN 650 MG RE SUPP: 650.0000 mg | Freq: Four times a day (QID) | RECTAL | Status: DC | PRN

## 2016-11-06 MED ORDER — DIPHENHYDRAMINE HCL 50 MG/ML IJ SOLN: 12.5000 mg | INTRAMUSCULAR | Status: DC | PRN

## 2016-11-06 NOTE — Evaluation (Signed)
Physical Therapy Evaluation Patient Details Name: [name] MRN: [id number] DOB: 1926/01/05 Today's Date: 11/06/2016   History of Present Illness  83 year old female with a history of Corey artery disease, cardiomyopathy, chronic respiratory failure, chronic kidney disease stage III, who presents to the ER with altered mental status, decreased urine output. Patient was found to have a rectal temperature 100.6. Most of the history is obtained from the ED chart. Per care giver had a choking spell 2 days ago , caregiver also noted decreased urine out put . Patient had an initial blood pressure of 82 x 58. Patient received fluid boluses in the ED that improved her blood pressure to systolic in the 57D. Empirically started on antibiotics due to concern for pneumonia.  Clinical Impression  Patient on 4 liters O2 throughout treatment, limited for gait and activity mostly due to SOB and fatigue, able to transfer to commode in bathroom for BM, fatigued after that and required Mod assist to put back to bed.  Patient will benefit from continued therapy to increase bilateral lower extremity strength for sit to stands, gait and transfers and to increase activity tolerance.    Follow Up Recommendations Supervision/Assistance - 24 hour;Home health PT    Equipment Recommendations  None recommended by PT    Recommendations for Other Services       Precautions / Restrictions Precautions Precautions: Fall Restrictions Weight Bearing Restrictions: No      Mobility  Bed Mobility Overal bed mobility: Needs Assistance Bed Mobility: Sit to Supine       Sit to supine: Min assist      Transfers Overall transfer level: Needs assistance Equipment used: Rolling walker (2 wheeled) Transfers: Sit to/from [facility] Sit to Stand: Min assist Stand pivot transfers: Min assist       General transfer comment: Min assist to transfer to commode in  bathroom  Ambulation/Gait Ambulation/Gait assistance: Mod assist Ambulation Distance (Feet): 15 Feet Assistive device: Rolling walker (2 wheeled) Gait Pattern/deviations: Decreased stride length;Decreased stance time - left;Decreased stance time - right   Gait velocity interpretation: Below normal speed for age/gender General Gait Details: Patient limited secondary to SOB and fatigue  Stairs            Wheelchair Mobility    Modified Rankin (Stroke Patients Only)       Balance Overall balance assessment: Needs assistance Sitting-balance support: Feet supported Sitting balance-Leahy Scale: Good     Standing balance support: Bilateral upper extremity supported Standing balance-Leahy Scale: Fair Standing balance comment: demonstrates fair/poor balance with FWW                             Pertinent Vitals/Pain Pain Assessment: No/denies pain    Home Living Family/patient expects to be discharged to:: Private residence Living Arrangements:  (has paid caregivers 24/7) Available Help at Discharge: Personal care attendant (2 caregivers day and night 24/7) Type of Home: House Home Access:  (no steps)     Home Layout: One level Home Equipment: Walker - 2 wheels;Cane - single point;Wheelchair - manual;Shower seat;Bedside commode      Prior Function Level of Independence: Needs assistance   Gait / Transfers Assistance Needed: has assistance  ADL's / Homemaking Assistance Needed:  (has caregivers 24/7)        Hand Dominance        Extremity/Trunk Assessment   Upper Extremity Assessment Upper Extremity Assessment: Overall WFL for tasks assessed  Lower Extremity Assessment Lower Extremity Assessment: Generalized weakness    Cervical / Trunk Assessment Cervical / Trunk Assessment: Kyphotic  Communication   Communication: No difficulties  Cognition Arousal/Alertness: Awake/alert Behavior During Therapy: WFL for tasks  assessed/performed Overall Cognitive Status: Within Functional Limits for tasks assessed                                        General Comments      Exercises     Assessment/Plan    PT Assessment Patient needs continued PT services  PT Problem List Decreased strength;Decreased activity tolerance       PT Treatment Interventions Gait training;Therapeutic activities;Therapeutic exercise    PT Goals (Current goals can be found in the Care Plan section)  Acute Rehab PT Goals Patient Stated Goal: Return home with caregivers to assist PT Goal Formulation: With patient Time For Goal Achievement: 11/13/16 Potential to Achieve Goals: Good    Frequency Min 3X/week   Barriers to discharge        Co-evaluation               AM-PAC PT "6 Clicks" Daily Activity  Outcome Measure Difficulty turning over in bed (including adjusting bedclothes, sheets and blankets)?: Total Difficulty moving from lying on back to sitting on the side of the bed? : Total Difficulty sitting down on and standing up from a chair with arms (e.g., wheelchair, bedside commode, etc,.)?: Total Help needed moving to and from a bed to chair (including a wheelchair)?: A Lot Help needed walking in hospital room?: A Lot Help needed climbing 3-5 steps with a railing? : Total 6 Click Score: 8    End of Session Equipment Utilized During Treatment: Gait belt Activity Tolerance: Patient limited by fatigue Patient left: in bed;with call bell/phone within reach;with bed alarm set Nurse Communication: Mobility status PT Visit Diagnosis: Other abnormalities of gait and mobility (R26.89);Muscle weakness (generalized) (M62.81)    Time: 8325-4982 PT Time Calculation (min) (ACUTE ONLY): 45 min   Charges:   PT Evaluation $PT Eval Moderate Complexity: 1 Mod PT Treatments $Therapeutic Activity: 8-22 mins   PT G CodesLonell Grandchild, MPT 11/06/2016, 3:42 PM

## 2016-11-06 NOTE — Progress Notes (Addendum)
5:40 - RRT called at 5:20.  Patient was chatting with the nursing tech when he laid her flat to pull her up in the bed.  She complained of feeling acutely SOB and became unresponsive with sats dropping into the 50s.  She was placed on NRB with gradual improvement in sats to 90s.  CXR appears to show flash pulmonary edema, which is consistent with the presentation.  She also had significant crackles primarily on the left on auscultation.  She has been given Lasix 20 mg IV and we are placing a foley.  Dr. Allyson Sabal was contacted and ordered an ABG and transfer to SDU.  However, in further discussion with the patient's daughter, the patient's goals are for comfort care and so transfer to SDU may not be warranted at this time.  Will continue with supportive care, Lasix, and monitor.  At this time, she does not appear to need BIPAP or transfer since this may not be in accordance with the patient's wishes.  Will await ABG results and further discuss with family at that time.  Carlyon Shadow, M.D.   6:08 - ABG shows: pH 7.035; pCO2 below reportable; pO2 89; 91% on NRB Suspect anoxic injury from prolonged hypoxia associated with flash pulmonary edema.  Patient's breath sounds are clearer (less crackles but diminished on the right) and she is continuing to have marked tachypnea as well as frothy sputum.  Will give an additional 40 mg Lasix.   Family is planning to discuss but appears to be leaning towards making patient comfort care only.  JEY  6:40 - After further family discussion, all are in agreement with transitioning the patient to full comfort care.  She will be placed on a morphine drip for comfort.  End of life order set was used and prior orders were discontinued.  All questions were answered for the family and a comfort cart was requested.  JEY  Total critical care time: 55 minutes Critical care time was exclusive of separately billable procedures and treating other patients. Critical care was  necessary to treat or prevent imminent or life-threatening deterioration. Critical care was time spent personally by me on the following activities: development of treatment plan with patient and/or surrogate as well as nursing, discussions with consultants, evaluation of patient's response to treatment, examination of patient, obtaining history from patient or surrogate, ordering and performing treatments and interventions, ordering and review of laboratory studies, ordering and review of radiographic studies, pulse oximetry and re-evaluation of patient's condition.

## 2016-11-06 NOTE — Care Management Note (Addendum)
Case Management Note  Patient Details  Name: ATINA FEELEY MRN: [id number] Date of Birth: Nov 27, 1925  Subjective/Objective: Adm with PNA. From home, has 24/7 caregivers. [facility] active with [facility].  Currently has acute need for oxygen.           Action/Plan: CM following for needs. Will place [facility] resumption orders.    Expected Discharge Date:  11/08/16               Expected Discharge Plan:  Home/Self Care  In-House Referral:     Discharge planning Services  CM Consult  Post Acute Care Choice:    Choice offered to:     DME Arranged:    DME Agency:     HH Arranged:   [facility] Agency:     Status of Service:  In process, will continue to follow  If discussed at Long Length of Stay Meetings, dates discussed:    Additional Comments:  Lander Eslick, Chauncey Reading, RN 11/06/2016, 2:55 PM

## 2016-11-06 NOTE — Progress Notes (Signed)
Patient transported to 313 via WC on O2.

## 2016-11-06 NOTE — Progress Notes (Addendum)
[facility] PROGRESS NOTE  [name] EZM:[id number] DOB: Oct 18, 1925 [name]: 10/23/2016   PCP: [name], MD     Assessment/Plan: Active Problems:   HYPERCHOLESTEROLEMIA   Hyperlipidemia   HTN (hypertension)   Coronary artery disease due to calcified coronary lesion   CKD (chronic kidney disease)   CKD (chronic kidney disease) stage 3, GFR 30-59 ml/min   CAP (community acquired pneumonia)   Chronic respiratory failure with hypoxia ([place])   Sepsis ([place])  83 year old female with a history of Corey artery disease, cardiomyopathy, chronic respiratory failure, chronic kidney disease stage III, who presents to the ER with altered mental status, decreased urine output. Patient was found to have a rectal temperature 100.6. Most of the history is obtained from the ED chart. Per care giver had a choking spell 2 days ago , caregiver also noted decreased urine out put . Patient had an initial blood pressure of 82 x 58. Patient received fluid boluses in the ED that improved her blood pressure to systolic in the 50P. Empirically started on antibiotics due to concern for pneumonia.  Assessment and plan  Sepsis probably secondary to underlying pneumonia Blood pressure parameters improved, transfer to telemetry Pneumonia order set Empiric antibiotics, namely Rocephin and azithromycin, day #2 Speech therapy evaluation to rule out aspiration Follow blood culture 2, no growth so far Initial lactic acid 1.0   Acute hypoxic respiratory failure Presumed pneumonia/acute on chronic CHF exacerbation Lasix IV 1 last night for wheezing, repeated , sob again this afternoon, transfer to step down on bipap Repeat chest x-ray today Started on Xopenex nebulizer treatments  Acute metabolic encephalopathy Likely secondary to underlying infection, improving  History of LV thrombus on Coumadin    Hypertension-continue to hold all home medications for now until blood pressure has  stabilized  Chronic combined CHF-most recent EF 25-30% on 2-D echo in 2016, appears compensated, will resume low-dose Lasix and Coreg   CKD (chronic kidney disease) stage 3, GFR 30-59 ml/min- baseline appears to be 1.1, was 1.61, now 1.41     Chronic respiratory failure with hypoxia (HCC)-home oxygen dependent, on 2 L, currently on 3 L   DVT prophylaxsis Coumadin  Code Status:   DO NOT RESUSCITATE    Code Status Orders DNR      Family Communication: Discussed in detail with the patient, all imaging results, lab results explained to the patient   Disposition Plan:  Transfer to telemetry     Consultants:  None   Procedures:  None   Antibiotics: Anti-infectives    Start     Dose/Rate Route Frequency Ordered Stop   11/06/16 1500  azithromycin (ZITHROMAX) 500 mg in dextrose 5 % 250 mL IVPB     500 mg 250 mL/hr over 60 Minutes Intravenous Every 24 hours 11/10/2016 1452 11/12/16 1459   11/06/16 1400  cefTRIAXone (ROCEPHIN) 1 g in dextrose 5 % 50 mL IVPB     1 g 100 mL/hr over 30 Minutes Intravenous Every 24 hours 10/26/2016 1452 11/13/16 1359   10/23/2016 1445  azithromycin (ZITHROMAX) 500 mg in dextrose 5 % 250 mL IVPB     500 mg 250 mL/hr over 60 Minutes Intravenous  Once 11/19/2016 1433 11/18/2016 1559   11/19/2016 1245  cefTRIAXone (ROCEPHIN) 2 g in dextrose 5 % 50 mL IVPB     2 g 100 mL/hr over 30 Minutes Intravenous  Once 11/13/2016 1237 10/23/2016 1407         HPI/Subjective: Patient had  only episode of wheezing last night, IV fluids discontinued, blood pressure stable this morning, currently on 3 L of oxygen  Objective: Vitals:   11/06/16 0230 11/06/16 0300 11/06/16 0330 11/06/16 0400  BP: (!) 140/104 (!) 160/68 (!) 133/120 (!) 136/107  Pulse: 79 75 74 72  Resp:  19 (!) 25 (!) 22  Temp:    98.8 F (37.1 C)  TempSrc:    Oral  SpO2: 91% 96% 94% 93%  Weight:      Height:        Intake/Output Summary (Last 24 hours) at 11/06/16 0900 Last data filed at 11/06/16  0200  Gross per 24 hour  Intake             1060 ml  Output                0 ml  Net             1060 ml    Exam:  Examination:  General exam: Appears calm and comfortable  Respiratory system: Clear to auscultation. Respiratory effort normal. Cardiovascular system: S1 & S2 heard, RRR. No JVD, murmurs, rubs, gallops or clicks. No pedal edema. Gastrointestinal system: Abdomen is nondistended, soft and nontender. No organomegaly or masses felt. Normal bowel sounds heard. Central nervous system: Alert and oriented. No focal neurological deficits. Extremities: Symmetric 5 x 5 power. Skin: No rashes, lesions or ulcers Psychiatry: Judgement and insight appear normal. Mood & affect appropriate.     Data Reviewed: I have personally reviewed following labs and imaging studies  Micro Results Recent Results (from the past 240 hour(s))  Culture, blood (Routine x 2)     Status: None (Preliminary result)   Collection Time: 10/24/2016 12:46 PM  Result Value Ref Range Status   Specimen Description RIGHT ANTECUBITAL  Final   Special Requests   Final    BOTTLES DRAWN AEROBIC AND ANAEROBIC Blood Culture adequate volume   Culture NO GROWTH < 24 HOURS  Final   Report Status PENDING  Incomplete  Culture, blood (Routine x 2)     Status: None (Preliminary result)   Collection Time: 11/18/2016 12:49 PM  Result Value Ref Range Status   Specimen Description BLOOD LEFT HAND  Final   Special Requests   Final    BOTTLES DRAWN AEROBIC AND ANAEROBIC Blood Culture adequate volume   Culture NO GROWTH < 24 HOURS  Final   Report Status PENDING  Incomplete  Respiratory Panel by PCR     Status: None   Collection Time: 11/04/2016  4:00 PM  Result Value Ref Range Status   Adenovirus NOT DETECTED NOT DETECTED Final   Coronavirus 229E NOT DETECTED NOT DETECTED Final   Coronavirus HKU1 NOT DETECTED NOT DETECTED Final   Coronavirus NL63 NOT DETECTED NOT DETECTED Final   Coronavirus OC43 NOT DETECTED NOT DETECTED Final    Metapneumovirus NOT DETECTED NOT DETECTED Final   Rhinovirus / Enterovirus NOT DETECTED NOT DETECTED Final   Influenza A NOT DETECTED NOT DETECTED Final   Influenza A H1 NOT DETECTED NOT DETECTED Final   Influenza A H1 2009 NOT DETECTED NOT DETECTED Final   Influenza A H3 NOT DETECTED NOT DETECTED Final   Influenza B NOT DETECTED NOT DETECTED Final   Parainfluenza Virus 1 NOT DETECTED NOT DETECTED Final   Parainfluenza Virus 2 NOT DETECTED NOT DETECTED Final   Parainfluenza Virus 3 NOT DETECTED NOT DETECTED Final   Parainfluenza Virus 4 NOT DETECTED NOT DETECTED Final   Respiratory  Syncytial Virus NOT DETECTED NOT DETECTED Final   Bordetella pertussis NOT DETECTED NOT DETECTED Final   Chlamydophila pneumoniae NOT DETECTED NOT DETECTED Final   Mycoplasma pneumoniae NOT DETECTED NOT DETECTED Final    Comment: Performed at [facility], [place] [address] [place], [place] [address]  MRSA PCR Screening     Status: None   Collection Time: 11/03/2016  4:00 PM  Result Value Ref Range Status   MRSA by PCR NEGATIVE NEGATIVE Final    Comment:        The GeneXpert MRSA Assay (FDA approved for NASAL specimens only), is one component of a comprehensive MRSA colonization surveillance program. It is not intended to diagnose MRSA infection nor to guide or monitor treatment for MRSA infections.     Radiology Reports Dg Chest 2 View  Result Date: 10/24/2016 CLINICAL DATA:  Possible sepsis. EXAM: CHEST  2 VIEW COMPARISON:  Radiographs of July 07, 2016. FINDINGS: Stable cardiomegaly. Stable left ventricular calcifications. Atherosclerosis of thoracic aorta is noted. Stable position of left-sided pacemaker. No pneumothorax is noted. Hypoinflation of the lungs is noted with mild bibasilar subsegmental atelectasis. Stable elevated right hemidiaphragm. Bony thorax is unremarkable. Interval development of mild right pleural effusion. IMPRESSION: Aortic atherosclerosis. Hypoinflation of the lungs with  mild bibasilar subsegmental atelectasis. Interval development of mild right pleural effusion. Electronically Signed   By: Marijo Conception, M.D.   On: 10/31/2016 13:50     CBC  Recent Labs Lab 11/04/2016 1246 11/06/16 0532  WBC 10.6* 11.0*  HGB 9.4* 9.5*  HCT 29.4* 30.3*  PLT 127* 126*  MCV 94.8 95.3  MCH 30.3 29.9  MCHC 32.0 31.4  RDW 13.2 13.0  LYMPHSABS 1.5  --   MONOABS 0.6  --   EOSABS 0.1  --   BASOSABS 0.0  --     Chemistries   Recent Labs Lab 11/04/2016 1246 11/13/2016 1301 11/06/16 0532  NA 137  --  140  K 4.3  --  4.0  CL 95*  --  99*  CO2 34*  --  32  GLUCOSE 112*  --  91  BUN 30*  --  30*  CREATININE 1.61*  --  1.41*  CALCIUM 8.2*  --  8.4*  MG  --  1.9  --   AST 19  --  17  ALT 13*  --  14  ALKPHOS 57  --  57  BILITOT 0.9  --  0.7   ------------------------------------------------------------------------------------------------------------------ estimated creatinine clearance is 23.1 mL/min (A) (by C-G formula based on SCr of 1.41 mg/dL (H)). ------------------------------------------------------------------------------------------------------------------ No results for input(s): HGBA1C in the last 72 hours. ------------------------------------------------------------------------------------------------------------------ No results for input(s): CHOL, HDL, LDLCALC, TRIG, CHOLHDL, LDLDIRECT in the last 72 hours. ------------------------------------------------------------------------------------------------------------------ No results for input(s): TSH, T4TOTAL, T3FREE, THYROIDAB in the last 72 hours.  Invalid input(s): FREET3 ------------------------------------------------------------------------------------------------------------------ No results for input(s): VITAMINB12, FOLATE, FERRITIN, TIBC, IRON, RETICCTPCT in the last 72 hours.  Coagulation profile  Recent Labs Lab 10/23/2016 1246 11/06/16 0532  INR 2.10 2.32    No results for input(s):  DDIMER in the last 72 hours.  Cardiac Enzymes No results for input(s): CKMB, TROPONINI, MYOGLOBIN in the last 168 hours.  Invalid input(s): CK ------------------------------------------------------------------------------------------------------------------ Invalid input(s): POCBNP   CBG: No results for input(s): GLUCAP in the last 168 hours.     Studies: Dg Chest 2 View  Result Date: 10/26/2016 CLINICAL DATA:  Possible sepsis. EXAM: CHEST  2 VIEW COMPARISON:  Radiographs of July 07, 2016. FINDINGS: Stable  cardiomegaly. Stable left ventricular calcifications. Atherosclerosis of thoracic aorta is noted. Stable position of left-sided pacemaker. No pneumothorax is noted. Hypoinflation of the lungs is noted with mild bibasilar subsegmental atelectasis. Stable elevated right hemidiaphragm. Bony thorax is unremarkable. Interval development of mild right pleural effusion. IMPRESSION: Aortic atherosclerosis. Hypoinflation of the lungs with mild bibasilar subsegmental atelectasis. Interval development of mild right pleural effusion. Electronically Signed   By: Marijo Conception, M.D.   On: 11/09/2016 13:50      Lab Results  Component Value Date   HGBA1C 6.3 (H) 06/25/2016   HGBA1C 6.0 (H) 02/07/2016   HGBA1C (H) 03/17/2009    6.4 (NOTE) The ADA recommends the following therapeutic goal for glycemic control related to Hgb A1c measurement: Goal of therapy: <6.5 Hgb A1c  Reference: American Diabetes Association: Clinical Practice Recommendations 2010, Diabetes Care, 2010, 33: (Suppl  1).   Lab Results  Component Value Date   LDLCALC 76 08/28/2015   CREATININE 1.41 (H) 11/06/2016       Scheduled Meds: . atorvastatin  40 mg Oral QPM  . feeding supplement (ENSURE ENLIVE)  237 mL Oral BID BM  . folic acid  1 mg Oral Daily  . linagliptin  5 mg Oral Daily  . pantoprazole  40 mg Oral Daily  . sertraline  50 mg Oral Daily  . Warfarin - Pharmacist Dosing Inpatient   Does not apply q1800    Continuous Infusions: . azithromycin    . cefTRIAXone (ROCEPHIN)  IV       LOS: 1 day    Time spent: >30 MINS    Reyne Dumas  [facility] Pager [phone]. If 7PM-7AM, please contact night-coverage at [email or web address], password [facility] 11/06/2016, 9:00 AM  LOS: 1 day

## 2016-11-06 NOTE — Progress Notes (Signed)
Pt woke up wheezing and somewhat SOB. She had a large urine occurrence in the bed that required linen change and a new purewick placed. I called the doctor for a breathing treatment as this is what the pt asked for for her wheezing. MD ordered Lasix, but no breathing tx at this time. She has expiratory wheezing. I will reassess shortly to see if her wob has decreased.  Roscoe Witts Rica Mote, RN 3:42 AM

## 2016-11-06 NOTE — Progress Notes (Signed)
Report given to M. Bobette Mo Therapist, sports.

## 2016-11-06 NOTE — Progress Notes (Signed)
SLP Cancellation Note  Patient Details Name: NANAMI WHITELAW MRN: [id number] DOB: 03-28-1925   Cancelled treatment:       Reason Eval/Treat Not Completed: Patient not medically ready;Medical issues which prohibited therapy; SLP arrived to room and Pt was in respiratory distress (nursing already in room and code called). BSE deferred at this time. If Pt transfers to ICU, please clarify/re-order BSE when appropriate.  Thank you,  Genene Churn, [place]    [place] 11/06/2016, 5:51 PM

## 2016-11-06 NOTE — Progress Notes (Addendum)
At [facility] staff noted patient had difficulty breathing became clammy and less responsive.Patient's O2 sat was 60%, patient was place on a nonrebreather and rapid response was called. Patient had IV lasix,IV morphine, a chest xray, ABGs and a foley place. Family was called and arrived at the bedside within 15 minutes. Patient's O2 sat has increased to 95%, foley cath has put out200cc at  This time. MD is aware of ABG results and family at bedside is waiting on remaining family to arrive to make decisions on further care.

## 2016-11-06 NOTE — Progress Notes (Signed)
RN spoke to family members about comfort care measures. Pts family stated they did want Vital signs to be taken as normal. Family refusing q2h turn, RN educated pt on prevention of skin breakdown and family stated pt does not like to be turned on her side. Pt taken off Non rebreather mask d/t O2 sats 100% and placed on 4L O2 per [place]. Morphine drip started. Family in room. Vendor turned ICD off per MD order. Will continue to monitor pt

## 2016-11-06 NOTE — Progress Notes (Signed)
ANTICOAGULATION CONSULT NOTE - Initial Consult  Pharmacy Consult for Coumadin Indication: History of LV mural throbus  Allergies  Allergen Reactions  . Codeine Nausea And Vomiting  . Macrobid [Nitrofurantoin Macrocrystal] Swelling    Medication caused facial swelling/tongue swelling    Patient Measurements: Height: 4\' 11"  (149.9 cm) Weight: 155 lb 6.8 oz (70.5 kg) IBW/kg (Calculated) : 43.2  Vital Signs: Temp: 97.5 F (36.4 C) (08/16 0800) Temp Source: Oral (08/16 0800) BP: 133/116 (08/16 1100) Pulse Rate: 75 (08/16 1100)  Labs:  Recent Labs  11/06/2016 1246 11/06/16 0532  HGB 9.4* 9.5*  HCT 29.4* 30.3*  PLT 127* 126*  LABPROT 23.9* 25.9*  INR 2.10 2.32  CREATININE 1.61* 1.41*    Estimated Creatinine Clearance: 23.1 mL/min (A) (by C-G formula based on SCr of 1.41 mg/dL (H)).   Medical History: Past Medical History:  Diagnosis Date  . Arthritis    severe lower back pain  . CAD (coronary artery disease)    anterior MI in 2006 with Cypher DES to LAD  . Cardiomyopathy   . CHF (congestive heart failure) ([place])   . CKD (chronic kidney disease) stage 3, GFR 30-59 ml/min    Stage III-stage IV  . HTN (hypertension)   . LV (left ventricular) mural thrombus    on coumadin   . MI, old   . Nephrolithiasis   . OA (osteoarthritis) of hip   . On home oxygen therapy    at night  . Osteoporosis   . Other specified forms of chronic ischemic heart disease    echo (1/10) with EF 25-30%, mildly dilated LV, periapical LV aneueysm, calcified LV thombus, moderate MR. Medtronic ICD set to VVI.   Marland Kitchen Pure hypercholesterolemia     Medications:  See med rec  Assessment: 83 yo female who presents to ED with AMS and fever.  On chronic coumadin for hx of LV mural thrombus. Patient is followed by anticoag clinic and takes 2.5mg  on Tues,THurs,Sunday, and 1.25mg   on Mon, Wed, Fri, Sat. INR is therapeutic.  Goal of Therapy:  INR 2-3 Monitor platelets by anticoagulation protocol:  Yes   Plan:  Coumadin 2.5mg  x 1 today Pt-INR daily Monitor for S/S of bleeding  Isac Sarna, BS Vena [place], BCPS Clinical Pharmacist Pager [phone] 11/06/2016,11:30 AM

## 2016-11-07 DIAGNOSIS — N179 Acute kidney failure, unspecified: Secondary | ICD-10-CM

## 2016-11-07 DIAGNOSIS — R400 Somnolence: Secondary | ICD-10-CM

## 2016-11-07 DIAGNOSIS — R4182 Altered mental status, unspecified: Secondary | ICD-10-CM

## 2016-11-07 DIAGNOSIS — J9611 Chronic respiratory failure with hypoxia: Secondary | ICD-10-CM

## 2016-11-07 DIAGNOSIS — J181 Lobar pneumonia, unspecified organism: Secondary | ICD-10-CM

## 2016-11-08 LAB — URINE CULTURE

## 2016-11-09 DIAGNOSIS — I509 Heart failure, unspecified: Secondary | ICD-10-CM | POA: Diagnosis not present

## 2016-11-09 DIAGNOSIS — R0602 Shortness of breath: Secondary | ICD-10-CM | POA: Diagnosis not present

## 2016-11-10 LAB — CULTURE, BLOOD (ROUTINE X 2)
CULTURE: NO GROWTH
CULTURE: NO GROWTH
SPECIAL REQUESTS: ADEQUATE
SPECIAL REQUESTS: ADEQUATE

## 2016-11-22 NOTE — Progress Notes (Signed)
[facility] PROGRESS NOTE  [name] UTM:[id number] DOB: [birth year] [name]: 10/24/2016   PCP: [name], MD     Assessment/Plan: Active Problems:   HYPERCHOLESTEROLEMIA   Hyperlipidemia   HTN (hypertension)   Coronary artery disease due to calcified coronary lesion   CKD (chronic kidney disease)   CKD (chronic kidney disease) stage 3, GFR 30-59 ml/min   CAP (community acquired pneumonia)   Chronic respiratory failure with hypoxia ([place])   Sepsis ([place])   AKI (acute kidney injury) ([place])   Altered mental status  83 year old female with a history of Corey artery disease, cardiomyopathy, chronic respiratory failure, chronic kidney disease stage III, who presents to the ER with altered mental status, decreased urine output. Patient was found to have a rectal temperature 100.6. Most of the history is obtained from the ED chart. Per care giver had a choking spell 2 days ago , caregiver also noted decreased urine out put . Patient had an initial blood pressure of 82 x 58. Patient received fluid boluses in the ED that improved her blood pressure to systolic in the 56C. Empirically started on antibiotics due to concern for pneumonia. Patient had decompensated 8/16. Now transition to comfort care  Assessment and plan  Sepsis probably secondary to underlying pneumonia Acute hypoxic respiratory failure Acute metabolic encephalopathy History of LV thrombus on Coumadin  Hypertension-  Chronic combined CHF-most recent EF 25-30% on 2-D echo in 2016,   CKD (chronic kidney disease) stage 3, GFR 30-59 ml/min- baseline appears to be 1.1, was 1.61, now 1.41 Chronic respiratory failure with hypoxia (HCC)-home oxygen dependent, on 2 L,    Plan Continue morphine drip,robinul, patient is comfortable, daughter is by the bedside, Anticipate hospital death  DVT prophylaxsis Coumadin  Code Status:   DO NOT RESUSCITATE    Code Status Orders DNR      Family Communication: Discussed  in detail with the patient, all imaging results, lab results explained to the patient   Disposition Plan:  Transfer to telemetry     Consultants:  None   Procedures:  None   Antibiotics: Anti-infectives    Start     Dose/Rate Route Frequency Ordered Stop   11/06/16 1500  azithromycin (ZITHROMAX) 500 mg in dextrose 5 % 250 mL IVPB  Status:  Discontinued     500 mg 250 mL/hr over 60 Minutes Intravenous Every 24 hours 11/11/2016 1452 11/06/16 1841   11/06/16 1400  cefTRIAXone (ROCEPHIN) 1 g in dextrose 5 % 50 mL IVPB  Status:  Discontinued     1 g 100 mL/hr over 30 Minutes Intravenous Every 24 hours 11/15/2016 1452 11/06/16 1841   10/29/2016 1445  azithromycin (ZITHROMAX) 500 mg in dextrose 5 % 250 mL IVPB     500 mg 250 mL/hr over 60 Minutes Intravenous  Once 10/31/2016 1433 11/06/2016 1559   11/04/2016 1245  cefTRIAXone (ROCEPHIN) 2 g in dextrose 5 % 50 mL IVPB     2 g 100 mL/hr over 30 Minutes Intravenous  Once 11/06/2016 1237 11/16/2016 1407         HPI/Subjective:  Patient noted to have shallow breathing, resting comfortably   Objective: Vitals:   11/06/16 2019 December 07, 2016 0500 2016-12-07 0525 Dec 07, 2016 0645  BP: 138/64 (!) 88/39    Pulse: 76 70  60  Resp:   (!) 5 (!) 5  Temp: 98.6 F (37 C) 98.4 F (36.9 C)    TempSrc: Axillary Axillary    SpO2: 98% (!) 70%  Marland Kitchen)  88%  Weight:      Height:        Intake/Output Summary (Last 24 hours) at 11/21/2016 1027 Last data filed at 21-Nov-2016 0650  Gross per 24 hour  Intake           280.75 ml  Output              540 ml  Net          -259.25 ml    Exam:  Examination:  General exam: Appears calm and comfortable  Respiratory system: Clear to auscultation. Respiratory effort normal. Cardiovascular system: S1 & S2 heard, RRR. No JVD, murmurs, rubs, gallops or clicks. No pedal edema. Gastrointestinal system: Abdomen is nondistended, soft and nontender. No organomegaly or masses felt. Normal bowel sounds heard. Central nervous system:  Sleepy Extremities: Symmetric 5 x 5 power. Skin: No rashes, lesions or ulcers      Data Reviewed: I have personally reviewed following labs and imaging studies  Micro Results Recent Results (from the past 240 hour(s))  Culture, blood (Routine x 2)     Status: None (Preliminary result)   Collection Time: 11/10/2016 12:46 PM  Result Value Ref Range Status   Specimen Description RIGHT ANTECUBITAL  Final   Special Requests   Final    BOTTLES DRAWN AEROBIC AND ANAEROBIC Blood Culture adequate volume   Culture NO GROWTH 2 DAYS  Final   Report Status PENDING  Incomplete  Culture, blood (Routine x 2)     Status: None (Preliminary result)   Collection Time: 10/30/2016 12:49 PM  Result Value Ref Range Status   Specimen Description BLOOD LEFT HAND  Final   Special Requests   Final    BOTTLES DRAWN AEROBIC AND ANAEROBIC Blood Culture adequate volume   Culture NO GROWTH 2 DAYS  Final   Report Status PENDING  Incomplete  Respiratory Panel by PCR     Status: None   Collection Time: 11/15/2016  4:00 PM  Result Value Ref Range Status   Adenovirus NOT DETECTED NOT DETECTED Final   Coronavirus 229E NOT DETECTED NOT DETECTED Final   Coronavirus HKU1 NOT DETECTED NOT DETECTED Final   Coronavirus NL63 NOT DETECTED NOT DETECTED Final   Coronavirus OC43 NOT DETECTED NOT DETECTED Final   Metapneumovirus NOT DETECTED NOT DETECTED Final   Rhinovirus / Enterovirus NOT DETECTED NOT DETECTED Final   Influenza A NOT DETECTED NOT DETECTED Final   Influenza A H1 NOT DETECTED NOT DETECTED Final   Influenza A H1 2009 NOT DETECTED NOT DETECTED Final   Influenza A H3 NOT DETECTED NOT DETECTED Final   Influenza B NOT DETECTED NOT DETECTED Final   Parainfluenza Virus 1 NOT DETECTED NOT DETECTED Final   Parainfluenza Virus 2 NOT DETECTED NOT DETECTED Final   Parainfluenza Virus 3 NOT DETECTED NOT DETECTED Final   Parainfluenza Virus 4 NOT DETECTED NOT DETECTED Final   Respiratory Syncytial Virus NOT DETECTED NOT  DETECTED Final   Bordetella pertussis NOT DETECTED NOT DETECTED Final   Chlamydophila pneumoniae NOT DETECTED NOT DETECTED Final   Mycoplasma pneumoniae NOT DETECTED NOT DETECTED Final    Comment: Performed at [facility], [address], [place], [place] [address]  MRSA PCR Screening     Status: None   Collection Time: 11/06/2016  4:00 PM  Result Value Ref Range Status   MRSA by PCR NEGATIVE NEGATIVE Final    Comment:        The GeneXpert MRSA Assay (FDA approved for  NASAL specimens only), is one component of a comprehensive MRSA colonization surveillance program. It is not intended to diagnose MRSA infection nor to guide or monitor treatment for MRSA infections.     Radiology Reports Dg Chest 2 View  Result Date: 11/18/2016 CLINICAL DATA:  Possible sepsis. EXAM: CHEST  2 VIEW COMPARISON:  Radiographs of July 07, 2016. FINDINGS: Stable cardiomegaly. Stable left ventricular calcifications. Atherosclerosis of thoracic aorta is noted. Stable position of left-sided pacemaker. No pneumothorax is noted. Hypoinflation of the lungs is noted with mild bibasilar subsegmental atelectasis. Stable elevated right hemidiaphragm. Bony thorax is unremarkable. Interval development of mild right pleural effusion. IMPRESSION: Aortic atherosclerosis. Hypoinflation of the lungs with mild bibasilar subsegmental atelectasis. Interval development of mild right pleural effusion. Electronically Signed   By: Marijo Conception, M.D.   On: 11/11/2016 13:50   Dg Chest Port 1 View  Result Date: 11/06/2016 CLINICAL DATA:  Cough and congestion EXAM: PORTABLE CHEST 1 VIEW COMPARISON:  11/19/2016 FINDINGS: Cardiac shadow is again enlarged. Elevation the right hemidiaphragm is seen. Defibrillator is again noted and stable. Mild vascular congestion is noted without significant interstitial edema. No definitive infiltrate is seen. IMPRESSION: Vascular congestion without significant edema. Electronically Signed   By: Inez Catalina M.D.   On: 11/06/2016 09:14   Dg Chest Port 1v Same Day  Result Date: 11/06/2016 CLINICAL DATA:  Shortness of breath and respiratory distress EXAM: PORTABLE CHEST 1 VIEW COMPARISON:  11/06/2016 FINDINGS: Left-sided and pacing device as before. Cardiomegaly. Increased bilateral pulmonary edema or infiltrates. Probable small right pleural effusion. Aortic atherosclerosis. IMPRESSION: Cardiomegaly with worsened bilateral interstitial and alveolar opacity concerning for pulmonary edema. Probable small right-sided pleural effusion. Electronically Signed   By: Donavan Foil M.D.   On: 11/06/2016 18:12     CBC  Recent Labs Lab 10/29/2016 1246 11/06/16 0532  WBC 10.6* 11.0*  HGB 9.4* 9.5*  HCT 29.4* 30.3*  PLT 127* 126*  MCV 94.8 95.3  MCH 30.3 29.9  MCHC 32.0 31.4  RDW 13.2 13.0  LYMPHSABS 1.5  --   MONOABS 0.6  --   EOSABS 0.1  --   BASOSABS 0.0  --     Chemistries   Recent Labs Lab 10/27/2016 1246 10/27/2016 1301 11/06/16 0532  NA 137  --  140  K 4.3  --  4.0  CL 95*  --  99*  CO2 34*  --  32  GLUCOSE 112*  --  91  BUN 30*  --  30*  CREATININE 1.61*  --  1.41*  CALCIUM 8.2*  --  8.4*  MG  --  1.9  --   AST 19  --  17  ALT 13*  --  14  ALKPHOS 57  --  57  BILITOT 0.9  --  0.7   ------------------------------------------------------------------------------------------------------------------ estimated creatinine clearance is 22.7 mL/min (A) (by C-G formula based on SCr of 1.41 mg/dL (H)). ------------------------------------------------------------------------------------------------------------------ No results for input(s): HGBA1C in the last 72 hours. ------------------------------------------------------------------------------------------------------------------ No results for input(s): CHOL, HDL, LDLCALC, TRIG, CHOLHDL, LDLDIRECT in the last 72  hours. ------------------------------------------------------------------------------------------------------------------ No results for input(s): TSH, T4TOTAL, T3FREE, THYROIDAB in the last 72 hours.  Invalid input(s): FREET3 ------------------------------------------------------------------------------------------------------------------ No results for input(s): VITAMINB12, FOLATE, FERRITIN, TIBC, IRON, RETICCTPCT in the last 72 hours.  Coagulation profile  Recent Labs Lab 11/08/2016 1246 11/06/16 0532  INR 2.10 2.32    No results for input(s): DDIMER in the last 72 hours.  Cardiac Enzymes No results for input(s): CKMB, TROPONINI, MYOGLOBIN  in the last 168 hours.  Invalid input(s): CK ------------------------------------------------------------------------------------------------------------------ Invalid input(s): POCBNP   CBG: No results for input(s): GLUCAP in the last 168 hours.     Studies: Dg Chest 2 View  Result Date: 11/09/2016 CLINICAL DATA:  Possible sepsis. EXAM: CHEST  2 VIEW COMPARISON:  Radiographs of July 07, 2016. FINDINGS: Stable cardiomegaly. Stable left ventricular calcifications. Atherosclerosis of thoracic aorta is noted. Stable position of left-sided pacemaker. No pneumothorax is noted. Hypoinflation of the lungs is noted with mild bibasilar subsegmental atelectasis. Stable elevated right hemidiaphragm. Bony thorax is unremarkable. Interval development of mild right pleural effusion. IMPRESSION: Aortic atherosclerosis. Hypoinflation of the lungs with mild bibasilar subsegmental atelectasis. Interval development of mild right pleural effusion. Electronically Signed   By: Marijo Conception, M.D.   On: 11/20/2016 13:50   Dg Chest Port 1 View  Result Date: 11/06/2016 CLINICAL DATA:  Cough and congestion EXAM: PORTABLE CHEST 1 VIEW COMPARISON:  11/12/2016 FINDINGS: Cardiac shadow is again enlarged. Elevation the right hemidiaphragm is seen. Defibrillator is again  noted and stable. Mild vascular congestion is noted without significant interstitial edema. No definitive infiltrate is seen. IMPRESSION: Vascular congestion without significant edema. Electronically Signed   By: Inez Catalina M.D.   On: 11/06/2016 09:14   Dg Chest Port 1v Same Day  Result Date: 11/06/2016 CLINICAL DATA:  Shortness of breath and respiratory distress EXAM: PORTABLE CHEST 1 VIEW COMPARISON:  11/06/2016 FINDINGS: Left-sided and pacing device as before. Cardiomegaly. Increased bilateral pulmonary edema or infiltrates. Probable small right pleural effusion. Aortic atherosclerosis. IMPRESSION: Cardiomegaly with worsened bilateral interstitial and alveolar opacity concerning for pulmonary edema. Probable small right-sided pleural effusion. Electronically Signed   By: Donavan Foil M.D.   On: 11/06/2016 18:12      Lab Results  Component Value Date   HGBA1C 6.3 (H) 06/25/2016   HGBA1C 6.0 (H) 02/07/2016   HGBA1C (H) 03/17/2009    6.4 (NOTE) The ADA recommends the following therapeutic goal for glycemic control related to Hgb A1c measurement: Goal of therapy: <6.5 Hgb A1c  Reference: American Diabetes Association: Clinical Practice Recommendations 2010, Diabetes Care, 2010, 33: (Suppl  1).   Lab Results  Component Value Date   LDLCALC 76 08/28/2015   CREATININE 1.41 (H) 11/06/2016       Scheduled Meds:  Continuous Infusions: . morphine 5 mg/hr (11/06/16 2014)     LOS: 2 days    Time spent: >30 MINS    Reyne Dumas  [facility] Pager [phone]. If 7PM-7AM, please contact night-coverage at [email or web address], password [facility] 03-Dec-2016, 10:27 AM  LOS: 2 days

## 2016-11-22 NOTE — Progress Notes (Signed)
Upon entering room, patient without respirations, heart tones, verified by 2nd RN, patient pronounced expired, family support given, Dr. Allyson Sabal will be notified.

## 2016-11-22 NOTE — Progress Notes (Signed)
Witness waste 150cc IV morphine drip by L. [employer]

## 2016-11-22 NOTE — Progress Notes (Signed)
Nutrition Brief Note  RD drawn to patient through [facility]. Chart reviewed.   She is now transitioning to comfort care anticipate hospital death per MD note.  No further nutrition interventions warranted at this time.   Colman Cater MS,RD,CSG,LDN Office: [phone] Pager: [phone]

## 2016-11-22 NOTE — Progress Notes (Signed)
Wasted morphine drip 150 cc in sink, witnessed by C. Toney Rakes, Therapist, sports.

## 2016-11-22 DEATH — deceased

## 2016-12-22 NOTE — Discharge Summary (Signed)
Physician Death  Summary  TRACE CEDERBERG MRN: [id number] DOB/AGE: 1925-09-11 83 y.o.  PCP: [name], MD   Admit date: 11-23-2016 Death  date: 12/14/16  Discharge Diagnoses:    Active Problems:   HYPERCHOLESTEROLEMIA   Hyperlipidemia   HTN (hypertension)   Coronary artery disease due to calcified coronary lesion   CKD (chronic kidney disease)   CKD (chronic kidney disease) stage 3, GFR 30-59 ml/min   CAP (community acquired pneumonia)   Chronic respiratory failure with hypoxia (HCC)   Sepsis ([place])   AKI (acute kidney injury) ([place])   Altered mental status          Discharge Medication List as of 11/25/2016  8:33 PM    CONTINUE these medications which have NOT CHANGED   Details  acetaminophen (TYLENOL) 325 MG tablet Take 325-650 mg by mouth every 6 (six) hours as needed for mild pain. , Historical Med    atorvastatin (LIPITOR) 40 MG tablet Take 1 Tablet by mouth every evening, Normal    carvedilol (COREG) 25 MG tablet Take 1 Tablet by mouth 2 times a day, Normal    cholecalciferol (VITAMIN D) 1000 UNITS tablet Take 1,000 Units by mouth daily.  , Historical Med    clotrimazole (LOTRIMIN) 1 % cream Apply 1 application topically 2 (two) times daily as needed., Historical Med    folic acid (FOLVITE) 1 MG tablet Take 1 mg by mouth daily., Historical Med    furosemide (LASIX) 40 MG tablet Take 1 tablet (40 mg total) by mouth 2 (two) times daily., Starting Tue 05/20/2016, Normal    gabapentin (NEURONTIN) 100 MG capsule 1 capsule 3 (three) times daily., Starting Mon 01/14/2016, Historical Med    isosorbide mononitrate (IMDUR) 60 MG 24 hr tablet Take 1 tablet (60 mg total) by mouth daily., Starting Thu 05/15/2016, Normal    losartan (COZAAR) 50 MG tablet Take 1 Tablet by mouth once daily, Normal    Multiple Vitamins-Minerals (ICAPS AREDS FORMULA PO) Take 2 capsules by mouth daily. , Historical Med    nitroGLYCERIN (NITROSTAT) 0.4 MG SL tablet Place 1 tablet (0.4 mg total)  under the tongue every 5 (five) minutes as needed for chest pain., Starting Mon 02/12/2015, Normal    pantoprazole (PROTONIX) 40 MG tablet Take 1 Tablet by mouth once daily, Normal    Phenazopyridine HCl (AZO-STANDARD PO) Take 2 tablets by mouth daily as needed., Historical Med    polyethylene glycol (MIRALAX / GLYCOLAX) packet Take 17 g by mouth daily., Starting Sat 04/28/2015, Print    saxagliptin HCl (ONGLYZA) 2.5 MG TABS tablet Take 2.5 mg by mouth daily., Historical Med    sertraline (ZOLOFT) 50 MG tablet Take 50 mg by mouth daily. , Historical Med    spironolactone (ALDACTONE) 25 MG tablet Take 1 Tablet by mouth once daily, Normal    traMADol (ULTRAM) 50 MG tablet Take 1 tablet (50 mg total) by mouth every 6 (six) hours as needed for moderate pain., Starting Wed 09/12/2015, Print    warfarin (COUMADIN) 2.5 MG tablet take ONE tablet daily OR as instructed by coumadin clinic, Normal             Allergies  Allergen Reactions  . Codeine Nausea And Vomiting  . Macrobid [Nitrofurantoin Macrocrystal] Swelling    Medication caused facial swelling/tongue swelling      Disposition: 20-Expired        Significant Diagnostic Studies:  Dg Chest 2 View  Result Date: 11-23-16 CLINICAL DATA:  Possible sepsis. EXAM: CHEST  2 VIEW COMPARISON:  Radiographs of July 07, 2016. FINDINGS: Stable cardiomegaly. Stable left ventricular calcifications. Atherosclerosis of thoracic aorta is noted. Stable position of left-sided pacemaker. No pneumothorax is noted. Hypoinflation of the lungs is noted with mild bibasilar subsegmental atelectasis. Stable elevated right hemidiaphragm. Bony thorax is unremarkable. Interval development of mild right pleural effusion. IMPRESSION: Aortic atherosclerosis. Hypoinflation of the lungs with mild bibasilar subsegmental atelectasis. Interval development of mild right pleural effusion. Electronically Signed   By: Marijo Conception, M.D.   On: 11/02/2016 13:50   Dg  Chest Port 1 View  Result Date: 11/06/2016 CLINICAL DATA:  Cough and congestion EXAM: PORTABLE CHEST 1 VIEW COMPARISON:  11/21/2016 FINDINGS: Cardiac shadow is again enlarged. Elevation the right hemidiaphragm is seen. Defibrillator is again noted and stable. Mild vascular congestion is noted without significant interstitial edema. No definitive infiltrate is seen. IMPRESSION: Vascular congestion without significant edema. Electronically Signed   By: Inez Catalina M.D.   On: 11/06/2016 09:14   Dg Chest Port 1v Same Day  Result Date: 11/06/2016 CLINICAL DATA:  Shortness of breath and respiratory distress EXAM: PORTABLE CHEST 1 VIEW COMPARISON:  11/06/2016 FINDINGS: Left-sided and pacing device as before. Cardiomegaly. Increased bilateral pulmonary edema or infiltrates. Probable small right pleural effusion. Aortic atherosclerosis. IMPRESSION: Cardiomegaly with worsened bilateral interstitial and alveolar opacity concerning for pulmonary edema. Probable small right-sided pleural effusion. Electronically Signed   By: Donavan Foil M.D.   On: 11/06/2016 18:12       Filed Weights   11/18/2016 1227 11/09/2016 1603 11/06/16 1403  Weight: 65.3 kg (144 lb) 70.5 kg (155 lb 6.8 oz) 67.9 kg (149 lb 11.1 oz)     Microbiology: No results found for this or any previous visit (from the past 240 hour(s)).     Blood Culture    Component Value Date/Time   SDES BLOOD LEFT HAND 11/14/2016 1249   SPECREQUEST  11/14/2016 1249    BOTTLES DRAWN AEROBIC AND ANAEROBIC Blood Culture adequate volume   CULT NO GROWTH 5 DAYS 11/11/2016 1249   REPTSTATUS 11/10/2016 FINAL 11/11/2016 1249      Labs: No results found for this or any previous visit (from the past 48 hour(s)).   Lipid Panel     Component Value Date/Time   CHOL 135 08/28/2015 1208   TRIG 118 08/28/2015 1208   HDL 35 (L) 08/28/2015 1208   CHOLHDL 3.9 08/28/2015 1208   VLDL 24 08/28/2015 1208   LDLCALC 76 08/28/2015 1208     Lab Results   Component Value Date   HGBA1C 6.3 (H) 06/25/2016   HGBA1C 6.0 (H) 02/07/2016   HGBA1C (H) 03/17/2009    6.4 (NOTE) The ADA recommends the following therapeutic goal for glycemic control related to Hgb A1c measurement: Goal of therapy: <6.5 Hgb A1c  Reference: American Diabetes Association: Clinical Practice Recommendations 2010, Diabetes Care, 2010, 33: (Suppl  1).     Lab Results  Component Value Date   LDLCALC 76 08/28/2015   CREATININE 1.41 (H) 11/06/2016     HPI :  83 year old female with a history of Corey artery disease, cardiomyopathy, chronic respiratory failure, chronic kidney disease stage III, who presents to the ER with altered mental status, decreased urine output. Patient was found to have a rectal temperature 100.6. Most of the history is obtained from the ED chart. Per care giver had a choking spell 2 days ago , caregiver also noted decreased urine out put .Patient had an initial blood pressure of 82 x  58. Patient received fluid boluses in the ED that improved her blood pressure to systolic in the 16X. Empirically started on antibiotics due to concern for pneumonia. Patient had decompensated 8/16. Now transition to comfort care  HOSPITAL COURSE:  * Sepsis probably secondary to underlying pneumonia Acute hypoxic respiratory failure Acute metabolic encephalopathy History of LV thrombuson Coumadin Hypertension-  Chronic combined CHF-most recent EF 25-30% on 2-D echo in 2016,   CKD (chronic kidney disease) stage 3, GFR 30-59 ml/min- baseline appears to be 1.1, was 1.61, now 1.41 Chronic respiratory failure with hypoxia (HCC)-home oxygen dependent, on 2 L,     Time of death on comfort care measures 5:42 PM  DVT prophylaxsis Coumadin    Discharge Exam:  Blood pressure (!) 88/39, pulse 60, temperature 98.4 F (36.9 C), temperature source Axillary, resp. rate (!) 5, height 4\' 11"  (1.499 m), weight 67.9 kg (149 lb 11.1 oz), SpO2 (!) 88  %.        SignedReyne Dumas 11/26/2016, 8:27 AM        Time spent >1 hour

## 2017-06-23 IMAGING — DX DG LUMBAR SPINE COMPLETE 4+V
5 series · 5 of 5 positions shown · non-contrast
Comparison: Plain films lumbar spine 06/29/2014. CT abdomen and
pelvis 09/11/2010.

CLINICAL DATA: Severe low back and bilateral leg pain for 1 week
rendering the patient unable to walk. Initial encounter. No known
injury.

EXAM:
LUMBAR SPINE - COMPLETE 4+ VIEW

[l-spine ap]
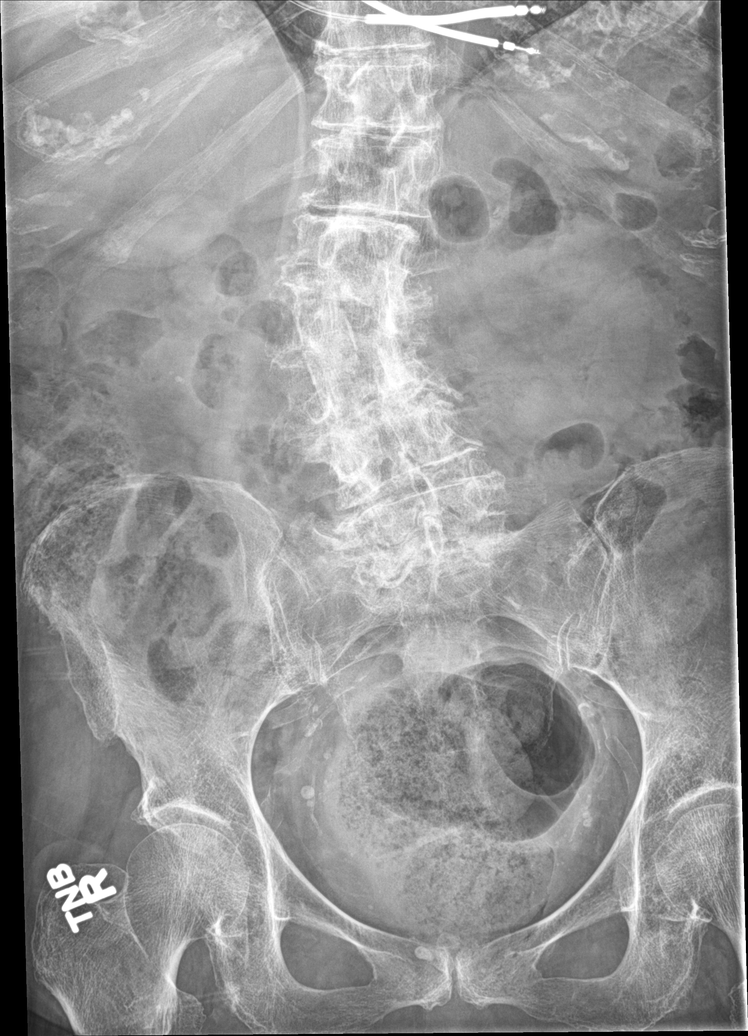

[l-spine obl (1 of 2)]
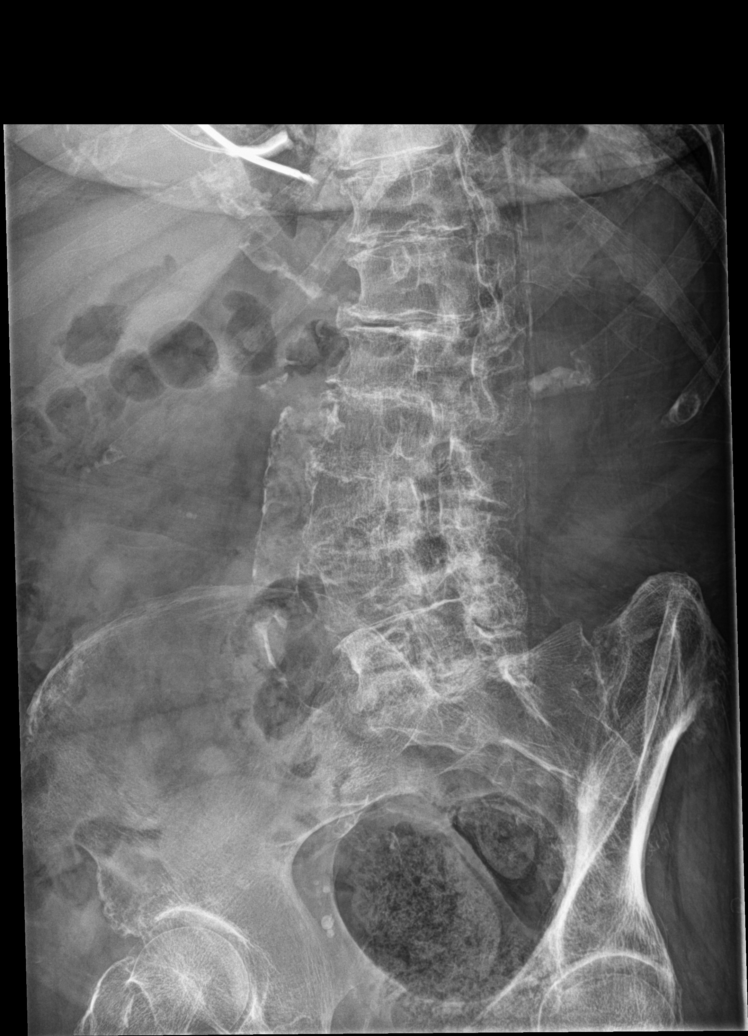

[l-spine obl (2 of 2)]
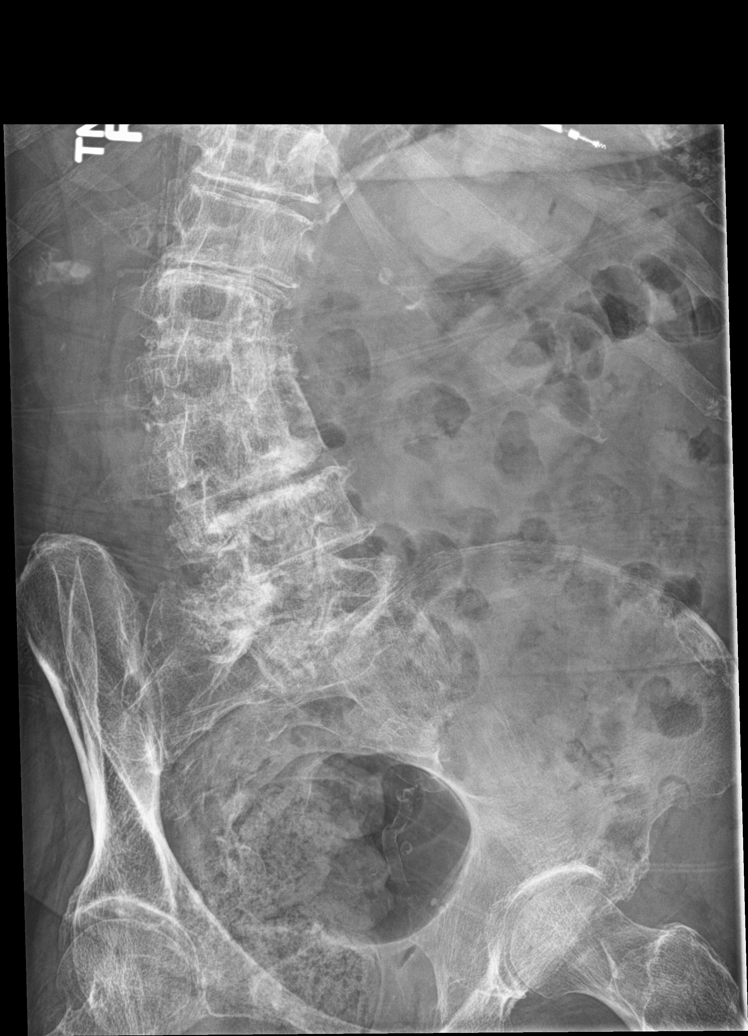

[l-spine lat]
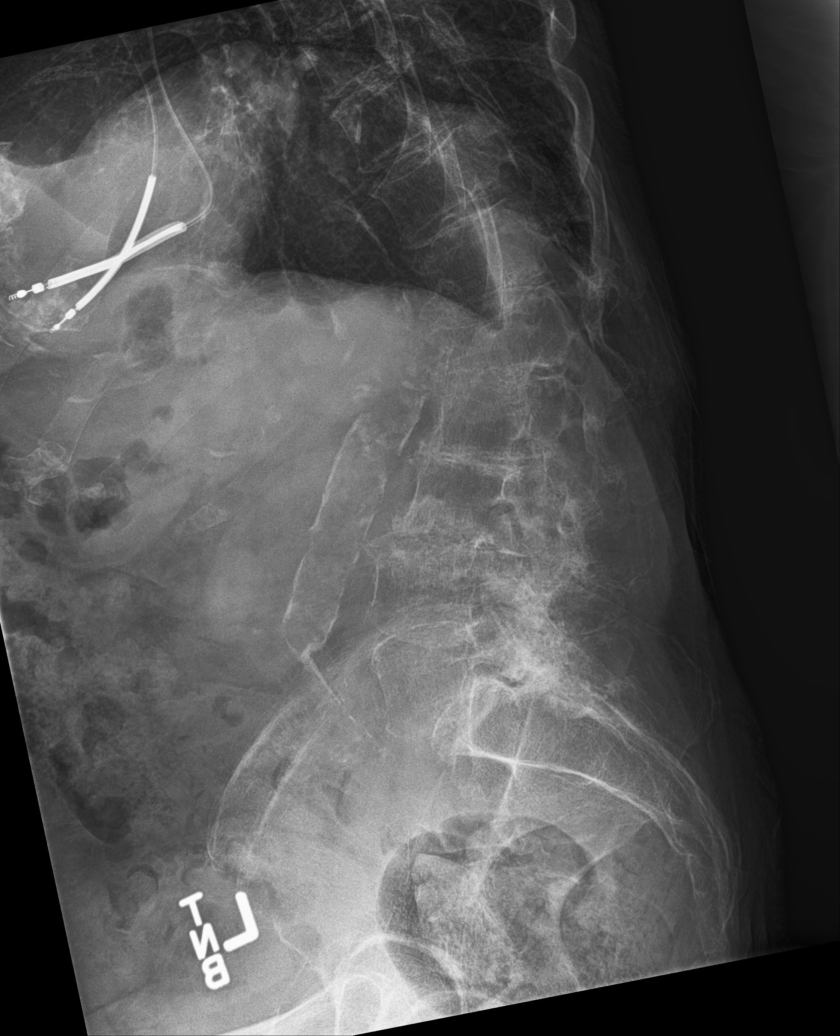

[l-spine spot]
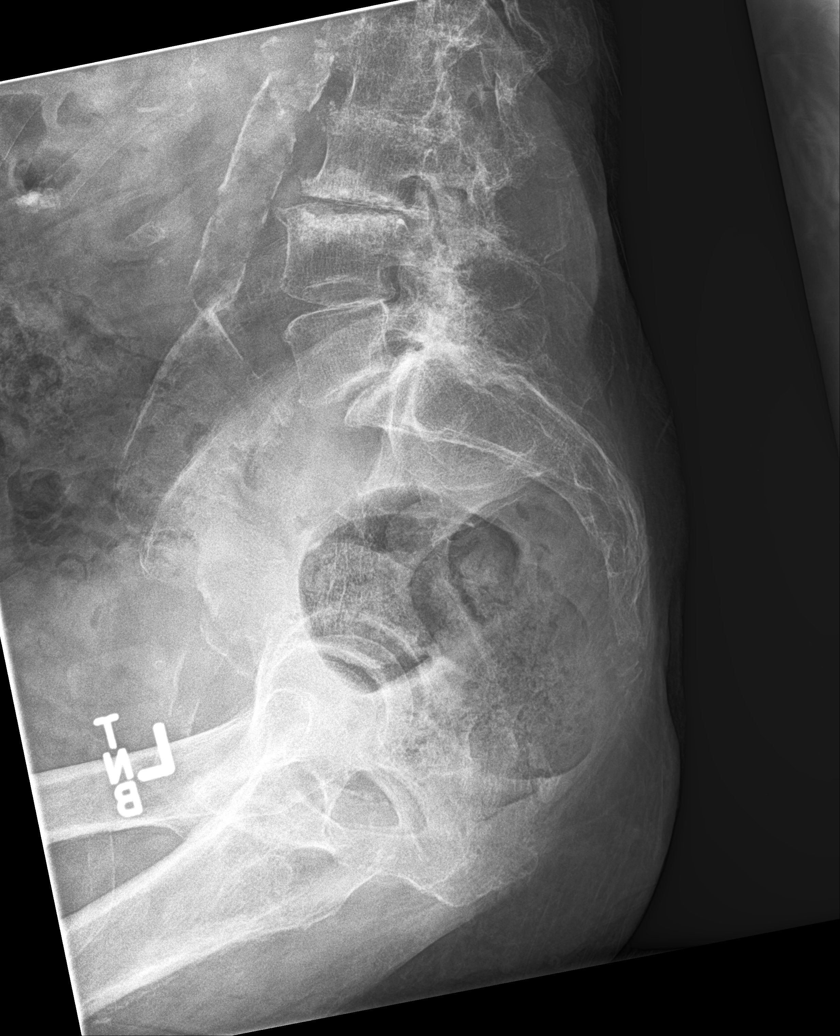

[5 of 5 positions shown; findings below may reference images not displayed]

FINDINGS: Vertebral body height is maintained. There is marked convex right
scoliosis. Severe multilevel loss of disc space height and facet
degenerative disease are seen. The appearance is not markedly
changed compared to the prior plain films. Bones are osteopenic.
Large stool ball in the rectum is noted.
IMPRESSION: No acute abnormality.

Marked convex right scoliosis and multilevel degenerative change.

Large stool ball in the rectum.
